# Patient Record
Sex: Female | Born: 1935 | Race: White | Hispanic: No | Smoking: Former smoker
Health system: Southern US, Community
[De-identification: ages and names within clinical notes are randomized; demographics above are authoritative.]

## PROBLEM LIST (undated history)

## (undated) DIAGNOSIS — I10 Essential (primary) hypertension: Secondary | ICD-10-CM

## (undated) DIAGNOSIS — M109 Gout, unspecified: Secondary | ICD-10-CM

## (undated) DIAGNOSIS — E785 Hyperlipidemia, unspecified: Secondary | ICD-10-CM

## (undated) DIAGNOSIS — R413 Other amnesia: Secondary | ICD-10-CM

## (undated) DIAGNOSIS — H269 Unspecified cataract: Secondary | ICD-10-CM

## (undated) HISTORY — DX: Gout, unspecified: M10.9

## (undated) HISTORY — PX: TUBAL LIGATION: SHX77

## (undated) HISTORY — DX: Other amnesia: R41.3

## (undated) HISTORY — DX: Hyperlipidemia, unspecified: E78.5

## (undated) HISTORY — DX: Essential (primary) hypertension: I10

## (undated) HISTORY — DX: Unspecified cataract: H26.9

---

## 1970-10-28 HISTORY — PX: MELANOMA EXCISION: SHX5266

## 1982-10-28 HISTORY — PX: SUPERFICIAL LYMPH NODE BIOPSY / EXCISION: SUR127

## 1998-02-10 ENCOUNTER — Ambulatory Visit (HOSPITAL_COMMUNITY): Admission: RE | Admit: 1998-02-10 | Discharge: 1998-02-10 | Payer: Self-pay | Admitting: Internal Medicine

## 1998-02-22 ENCOUNTER — Ambulatory Visit (HOSPITAL_COMMUNITY): Admission: RE | Admit: 1998-02-22 | Discharge: 1998-02-22 | Payer: Self-pay | Admitting: Cardiology

## 1998-03-23 ENCOUNTER — Encounter: Admission: RE | Admit: 1998-03-23 | Discharge: 1998-06-21 | Payer: Self-pay | Admitting: Internal Medicine

## 1999-08-23 ENCOUNTER — Ambulatory Visit (HOSPITAL_COMMUNITY): Admission: RE | Admit: 1999-08-23 | Discharge: 1999-08-23 | Payer: Self-pay | Admitting: Internal Medicine

## 1999-10-11 ENCOUNTER — Encounter: Admission: RE | Admit: 1999-10-11 | Discharge: 1999-10-11 | Payer: Self-pay | Admitting: Internal Medicine

## 1999-10-11 ENCOUNTER — Encounter: Payer: Self-pay | Admitting: Internal Medicine

## 1999-10-16 ENCOUNTER — Encounter: Payer: Self-pay | Admitting: Internal Medicine

## 1999-10-16 ENCOUNTER — Encounter: Admission: RE | Admit: 1999-10-16 | Discharge: 1999-10-16 | Payer: Self-pay | Admitting: Internal Medicine

## 2000-06-17 ENCOUNTER — Encounter: Payer: Self-pay | Admitting: Surgery

## 2000-06-17 ENCOUNTER — Encounter: Admission: RE | Admit: 2000-06-17 | Discharge: 2000-06-17 | Payer: Self-pay | Admitting: Surgery

## 2000-06-24 ENCOUNTER — Encounter: Admission: RE | Admit: 2000-06-24 | Discharge: 2000-06-24 | Payer: Self-pay | Admitting: Surgery

## 2000-06-24 ENCOUNTER — Encounter: Payer: Self-pay | Admitting: Surgery

## 2000-07-30 ENCOUNTER — Other Ambulatory Visit: Admission: RE | Admit: 2000-07-30 | Discharge: 2000-07-30 | Payer: Self-pay | Admitting: Internal Medicine

## 2000-08-06 ENCOUNTER — Ambulatory Visit (HOSPITAL_COMMUNITY): Admission: RE | Admit: 2000-08-06 | Discharge: 2000-08-06 | Payer: Self-pay | Admitting: Internal Medicine

## 2000-09-09 ENCOUNTER — Encounter: Admission: RE | Admit: 2000-09-09 | Discharge: 2000-09-22 | Payer: Self-pay | Admitting: Neurosurgery

## 2000-10-23 ENCOUNTER — Encounter: Admission: RE | Admit: 2000-10-23 | Discharge: 2000-10-23 | Payer: Self-pay | Admitting: Internal Medicine

## 2000-10-23 ENCOUNTER — Encounter: Payer: Self-pay | Admitting: Internal Medicine

## 2001-10-27 ENCOUNTER — Encounter: Admission: RE | Admit: 2001-10-27 | Discharge: 2001-10-27 | Payer: Self-pay | Admitting: Internal Medicine

## 2001-10-27 ENCOUNTER — Encounter: Payer: Self-pay | Admitting: Internal Medicine

## 2002-09-16 ENCOUNTER — Ambulatory Visit (HOSPITAL_COMMUNITY): Admission: RE | Admit: 2002-09-16 | Discharge: 2002-09-16 | Payer: Self-pay | Admitting: Internal Medicine

## 2002-11-12 ENCOUNTER — Encounter: Payer: Self-pay | Admitting: Internal Medicine

## 2002-11-12 ENCOUNTER — Encounter: Admission: RE | Admit: 2002-11-12 | Discharge: 2002-11-12 | Payer: Self-pay | Admitting: Internal Medicine

## 2003-11-16 ENCOUNTER — Encounter: Admission: RE | Admit: 2003-11-16 | Discharge: 2003-11-16 | Payer: Self-pay | Admitting: Internal Medicine

## 2004-09-07 ENCOUNTER — Ambulatory Visit: Payer: Self-pay | Admitting: Internal Medicine

## 2004-09-14 ENCOUNTER — Ambulatory Visit: Payer: Self-pay | Admitting: Internal Medicine

## 2004-09-14 ENCOUNTER — Ambulatory Visit (HOSPITAL_COMMUNITY): Admission: RE | Admit: 2004-09-14 | Discharge: 2004-09-14 | Payer: Self-pay | Admitting: Internal Medicine

## 2004-09-18 ENCOUNTER — Other Ambulatory Visit: Admission: RE | Admit: 2004-09-18 | Discharge: 2004-09-18 | Payer: Self-pay | Admitting: Family Medicine

## 2004-11-19 ENCOUNTER — Ambulatory Visit (HOSPITAL_COMMUNITY): Admission: RE | Admit: 2004-11-19 | Discharge: 2004-11-19 | Payer: Self-pay | Admitting: Internal Medicine

## 2005-04-11 ENCOUNTER — Ambulatory Visit (HOSPITAL_COMMUNITY): Admission: RE | Admit: 2005-04-11 | Discharge: 2005-04-11 | Payer: Self-pay | Admitting: Internal Medicine

## 2005-11-21 ENCOUNTER — Ambulatory Visit (HOSPITAL_COMMUNITY): Admission: RE | Admit: 2005-11-21 | Discharge: 2005-11-21 | Payer: Self-pay | Admitting: Internal Medicine

## 2006-03-25 ENCOUNTER — Encounter: Admission: RE | Admit: 2006-03-25 | Discharge: 2006-03-25 | Payer: Self-pay | Admitting: Internal Medicine

## 2006-11-28 ENCOUNTER — Encounter: Admission: RE | Admit: 2006-11-28 | Discharge: 2006-11-28 | Payer: Self-pay | Admitting: Internal Medicine

## 2007-12-02 ENCOUNTER — Ambulatory Visit (HOSPITAL_COMMUNITY): Admission: RE | Admit: 2007-12-02 | Discharge: 2007-12-02 | Payer: Self-pay | Admitting: Internal Medicine

## 2007-12-02 ENCOUNTER — Encounter (INDEPENDENT_AMBULATORY_CARE_PROVIDER_SITE_OTHER): Payer: Self-pay | Admitting: Internal Medicine

## 2007-12-08 ENCOUNTER — Encounter: Admission: RE | Admit: 2007-12-08 | Discharge: 2007-12-08 | Payer: Self-pay | Admitting: Internal Medicine

## 2007-12-19 ENCOUNTER — Emergency Department (HOSPITAL_COMMUNITY): Admission: EM | Admit: 2007-12-19 | Discharge: 2007-12-19 | Payer: Self-pay | Admitting: Family Medicine

## 2008-11-15 ENCOUNTER — Telehealth: Payer: Self-pay | Admitting: Internal Medicine

## 2008-12-23 ENCOUNTER — Encounter: Admission: RE | Admit: 2008-12-23 | Discharge: 2008-12-23 | Payer: Self-pay | Admitting: Internal Medicine

## 2009-08-08 ENCOUNTER — Encounter (INDEPENDENT_AMBULATORY_CARE_PROVIDER_SITE_OTHER): Payer: Self-pay | Admitting: *Deleted

## 2009-08-31 ENCOUNTER — Ambulatory Visit: Payer: Self-pay | Admitting: Internal Medicine

## 2009-09-13 ENCOUNTER — Telehealth (INDEPENDENT_AMBULATORY_CARE_PROVIDER_SITE_OTHER): Payer: Self-pay | Admitting: *Deleted

## 2009-09-14 ENCOUNTER — Ambulatory Visit: Payer: Self-pay | Admitting: Internal Medicine

## 2009-12-28 ENCOUNTER — Encounter: Admission: RE | Admit: 2009-12-28 | Discharge: 2009-12-28 | Payer: Self-pay | Admitting: Internal Medicine

## 2010-11-21 ENCOUNTER — Other Ambulatory Visit: Payer: Self-pay | Admitting: Internal Medicine

## 2010-11-21 DIAGNOSIS — Z1239 Encounter for other screening for malignant neoplasm of breast: Secondary | ICD-10-CM

## 2010-12-07 ENCOUNTER — Ambulatory Visit (HOSPITAL_COMMUNITY)
Admission: AD | Admit: 2010-12-07 | Discharge: 2010-12-08 | Disposition: A | Discharge status: Home / Self Care | Payer: BC Managed Care – PPO | Source: Ambulatory Visit | Attending: Internal Medicine | Admitting: Internal Medicine

## 2010-12-07 DIAGNOSIS — Z79899 Other long term (current) drug therapy: Secondary | ICD-10-CM | POA: Insufficient documentation

## 2010-12-07 DIAGNOSIS — Z87891 Personal history of nicotine dependence: Secondary | ICD-10-CM | POA: Insufficient documentation

## 2010-12-07 DIAGNOSIS — I6529 Occlusion and stenosis of unspecified carotid artery: Secondary | ICD-10-CM | POA: Insufficient documentation

## 2010-12-07 DIAGNOSIS — E785 Hyperlipidemia, unspecified: Secondary | ICD-10-CM | POA: Insufficient documentation

## 2010-12-07 DIAGNOSIS — Z85820 Personal history of malignant melanoma of skin: Secondary | ICD-10-CM | POA: Insufficient documentation

## 2010-12-07 DIAGNOSIS — I10 Essential (primary) hypertension: Secondary | ICD-10-CM | POA: Insufficient documentation

## 2010-12-07 DIAGNOSIS — Z8249 Family history of ischemic heart disease and other diseases of the circulatory system: Secondary | ICD-10-CM | POA: Insufficient documentation

## 2010-12-07 DIAGNOSIS — R0789 Other chest pain: Secondary | ICD-10-CM | POA: Insufficient documentation

## 2010-12-07 DIAGNOSIS — I059 Rheumatic mitral valve disease, unspecified: Secondary | ICD-10-CM | POA: Insufficient documentation

## 2010-12-07 LAB — CBC
MCV: 88.8 fL (ref 78.0–100.0)
Platelets: 191 10*3/uL (ref 150–400)
RBC: 4.18 MIL/uL (ref 3.87–5.11)
WBC: 9.8 10*3/uL (ref 4.0–10.5)

## 2010-12-07 LAB — COMPREHENSIVE METABOLIC PANEL
ALT: 19 U/L (ref 0–35)
AST: 19 U/L (ref 0–37)
Albumin: 3.9 g/dL (ref 3.5–5.2)
Alkaline Phosphatase: 96 U/L (ref 39–117)
BUN: 20 mg/dL (ref 6–23)
Chloride: 105 mEq/L (ref 96–112)
Potassium: 4 mEq/L (ref 3.5–5.1)
Sodium: 140 mEq/L (ref 135–145)
Total Bilirubin: 1 mg/dL (ref 0.3–1.2)
Total Protein: 6.8 g/dL (ref 6.0–8.3)

## 2010-12-07 LAB — CARDIAC PANEL(CRET KIN+CKTOT+MB+TROPI)
CK, MB: 1.9 ng/mL (ref 0.3–4.0)
Relative Index: INVALID (ref 0.0–2.5)
Relative Index: INVALID (ref 0.0–2.5)
Total CK: 81 U/L (ref 7–177)
Troponin I: <0.01 ng/mL (ref 0.00–0.06)

## 2010-12-16 NOTE — Discharge Summary (Signed)
  NAMEJAHNE, Katelyn Powers                   ACCOUNT NO.:  [id number]  MEDICAL RECORD NO.:  [id number]           PATIENT TYPE:  I  LOCATION:  1423                         FACILITY:  [facility]  PHYSICIAN:  Georgann Housekeeper, MD      DATE OF BIRTH:  Apr 26, 1932  DATE OF ADMISSION:  12/07/2010 DATE OF DISCHARGE:  12/08/2010                              DISCHARGE SUMMARY   DISCHARGE DIAGNOSES: 1. Chest pain, rule out myocardial infarction, atypical. 2. Hypertension.  MEDICATIONS ON DISCHARGE: 1. Losartan 100 mg daily. 2. Simvastatin 40 mg daily. 3. Vitamin D3 1000 units daily. 4. Ibuprofen 200 mg p.r.n. every 8 hours. 5. Aspirin 81 mg daily. 6. Allopurinol 300 mg daily. 7. Amlodipine 10 mg daily.  LABORATORY DATA AND DIAGNOSTIC STUDIES:  CK-MB, troponin x2 negative. Blood chemistries; sodium 140, potassium 4.0, creatinine of 0.8.  CBC; hemoglobin of 12.9, white count 9.8.  Chest x-ray at the office was negative.  EKG, repeat, normal sinus rhythm.  Telemetry normal without any arrhythmias.  HOSPITAL COURSE:  [age]-year-old female with history hypertension, dyslipidemia with left shoulder and upper back discomfort, presented to the office, admitted for rule out MI.  The patient was admitted to telemetry.  She had no chest pain, difficulty breathing.  Her blood work and EKG all remained negative.  Blood pressure remained stable.  The patient will be discharged.  Pain was likely thought to be more musculoskeletal.  She will continue ibuprofen.  If the symptoms recur, she will call the PCP, otherwise follow up as scheduled next month. Discharged stable.     Georgann Housekeeper, MD     [name]  D:  12/08/2010  T:  12/08/2010  Job:  [id number]  Electronically Signed by Georgann Housekeeper MD on 12/16/2010 10:14:24 AM

## 2010-12-19 NOTE — H&P (Addendum)
Katelyn Powers, TRUMBO NO.:  [id number]  MEDICAL RECORD NO.:  [id number]           PATIENT TYPE:  I  LOCATION:  1423                         FACILITY:  [facility]  PHYSICIAN:  Elby Showers, MD    DATE OF BIRTH:  1932-08-22  DATE OF ADMISSION:  12/07/2010 DATE OF DISCHARGE:                             HISTORY & PHYSICAL   PRIMARY CARE PROVIDER:  Thora Lance, MD  CHIEF COMPLAINT:  Chest pain.  HISTORY OF PRESENT ILLNESS:  This is a 77 year old female with past medical history of hypertension, hyperlipidemia, right internal carotid artery stenosis, and a 2-D echo in October 2011 showing mildly thickened mitral valve with mild regurgitation, mild calcification of the aortic valve with very mild aortic stenosis, and trace aortic regurgitation and grade 1 diastolic dysfunction with a preserved left ventricular function and ejection fraction of 68%, presents today with complaint of left- sided chest pain.  She describes the pain originating beneath the left scapula radiating to the front of the left chest starting at approximately 6 p.m. last night.  At worst, pain is 7/10, last for 30 minutes to an hour and then subsides and feels like a fullness in the left chest.  The sensation has been persistent over night.  She woke up at midnight and then again at 4 a.m. wondering if she should call the emergency department.  She has had no diaphoresis, no nausea or vomiting, no lightheadedness, no peripheral edema, no shortness of breath.  At the moment, she has only mild sensation of pressure, but reports that on the drive to the clinic she was having left-sided chest pain.  CURRENT MEDICATIONS: 1. Vitamin D 400 units 1 tablet twice a day. 2. Aspirin 81 mg 1 tablet daily. 3. Amlodipine 10 mg 1 tablet daily. 4. Losartan 100 mg 1 tablet daily. 5. Allopurinol 300 mg 1 tablet daily. 6. Simvastatin 40 mg one-half tablet every evening.  MEDICAL HISTORY: 1.  Hypertension. 2. Tophaceous gout. 3. Right internal carotid artery stenosis 60% to 80%. 4. Mild aortic stenosis by 2-D echo in October 2011. 5. Hyperlipidemia. 6. Malignant melanoma of the right arm. 7. Impaired fasting glucose.  ALLERGIES:  LISINOPRIL causes cough.  PAST SURGERIES: 1. Malignant melanoma excision right arm in 1971 with recurrence and     repeated surgery in 1983. 2. Left axillary lymph node dissection in 1983.  FAMILY MEDICAL HISTORY:  Significant for multiple family members with hypertension, coronary artery disease, colon cancer.  One brother with early coronary artery disease starting in his 54s.  SOCIAL HISTORY:  She is a former smoker, quit in 1982/03/21 with a one to two pack-year history.  She does not consume alcohol.  She does not exercise.  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  She is widowed.  Her husband died of lung cancer in 03/22/2003. She has 2 children.  REVIEW OF SYSTEMS:  Per history of present illness.  She denies association of pain with movement of the left arm, shoulder, or neck. Denies association with exertion, but has not exerted herself.  Denies cough, fever, chills, trauma to  the left side, vision change, change in exercise tolerance over the past few weeks.  PHYSICAL EXAMINATION:  VITAL SIGNS:  Temperature 98.1, pulse 72, blood pressure 128/68. GENERAL APPEARANCE:  Alert, oriented, no apparent distress.  Nervous. HEENT:  Pupils are equal, round, and reactive.  Extraocular motions are intact.  Conjunctivae are clear.  There is significant scarring in the left tympanic membrane, but no erythema.  Right tympanic membrane is clear.  Mucous membranes are moist.  Good dentition.  Oropharynx is clear. NECK:  Supple.  No lymphadenopathy, no thyromegaly.  No thyroid tenderness.  No JVD, no bruit.  Note, there is a midline scar, which is from a childhood accident.  This is not a surgical scar. LUNGS:  Clear to auscultation  bilaterally with good air movement.  No pain with deep inspiration. HEART:  Regular rate and rhythm.  There is a 3/6 systolic ejection murmur. ABDOMEN:  Bowel sounds are present.  Soft, nontender, nondistended.  No guarding. EXTREMITIES:  Pulses were 2+.  There is trace edema bilaterally. NEUROLOGIC:  Nonfocal.  ASSESSMENT AND PLAN:  Chest pain.  Chest pain is atypical and it is not associated with exertion, shortness of breath, or diaphoresis.  She does have risk factors of advanced age, hypertension, and hyperlipidemia. She has been experiencing persistent chest pressure or pain for about 12 hours.  EKG in clinic this morning is unremarkable.  Chest x-ray is unremarkable.  I will admit to telemetry for cardiac rule out.  We will obtain three sets of cardiac enzymes and repeat EKG.  If all test negative, she will likely be discharged in the morning and scheduled for an outpatient cardiac evaluation.  There are no labs obtained in the clinic today.  These will be done as an inpatient.     Elby Showers, MD     CW/MEDQ  D:  12/07/2010  T:  12/07/2010  Job:  [id number]  Electronically Signed by Elby Showers MD on 12/19/2010 09:59:24 AM

## 2011-01-01 ENCOUNTER — Ambulatory Visit
Admission: RE | Admit: 2011-01-01 | Discharge: 2011-01-01 | Disposition: A | Discharge status: Home / Self Care | Payer: BC Managed Care – PPO | Source: Ambulatory Visit | Attending: Internal Medicine | Admitting: Internal Medicine

## 2011-01-01 DIAGNOSIS — Z1239 Encounter for other screening for malignant neoplasm of breast: Secondary | ICD-10-CM

## 2011-12-06 ENCOUNTER — Other Ambulatory Visit: Payer: Self-pay | Admitting: Internal Medicine

## 2011-12-06 DIAGNOSIS — Z1231 Encounter for screening mammogram for malignant neoplasm of breast: Secondary | ICD-10-CM

## 2012-01-02 ENCOUNTER — Ambulatory Visit
Admission: RE | Admit: 2012-01-02 | Discharge: 2012-01-02 | Disposition: A | Discharge status: Home / Self Care | Payer: Medicare Other | Source: Ambulatory Visit | Attending: Internal Medicine | Admitting: Internal Medicine

## 2012-01-02 DIAGNOSIS — Z1231 Encounter for screening mammogram for malignant neoplasm of breast: Secondary | ICD-10-CM

## 2013-01-27 ENCOUNTER — Other Ambulatory Visit: Payer: Self-pay

## 2013-01-27 DIAGNOSIS — Z1231 Encounter for screening mammogram for malignant neoplasm of breast: Secondary | ICD-10-CM

## 2013-01-28 ENCOUNTER — Ambulatory Visit
Admission: RE | Admit: 2013-01-28 | Discharge: 2013-01-28 | Disposition: A | Discharge status: Home / Self Care | Payer: Medicare Other | Source: Ambulatory Visit

## 2013-01-28 DIAGNOSIS — Z1231 Encounter for screening mammogram for malignant neoplasm of breast: Secondary | ICD-10-CM

## 2014-02-08 ENCOUNTER — Other Ambulatory Visit: Payer: Self-pay | Admitting: Internal Medicine

## 2014-02-08 DIAGNOSIS — I6529 Occlusion and stenosis of unspecified carotid artery: Secondary | ICD-10-CM

## 2014-02-09 ENCOUNTER — Other Ambulatory Visit: Payer: Self-pay

## 2014-02-09 DIAGNOSIS — Z1231 Encounter for screening mammogram for malignant neoplasm of breast: Secondary | ICD-10-CM

## 2014-02-15 ENCOUNTER — Ambulatory Visit
Admission: RE | Admit: 2014-02-15 | Discharge: 2014-02-15 | Disposition: A | Discharge status: Home / Self Care | Payer: Commercial Managed Care - HMO | Source: Ambulatory Visit | Attending: Internal Medicine | Admitting: Internal Medicine

## 2014-02-15 ENCOUNTER — Ambulatory Visit
Admission: RE | Admit: 2014-02-15 | Discharge: 2014-02-15 | Disposition: A | Discharge status: Home / Self Care | Payer: Self-pay | Source: Ambulatory Visit

## 2014-02-15 ENCOUNTER — Other Ambulatory Visit: Payer: Medicare Other

## 2014-02-15 ENCOUNTER — Encounter (INDEPENDENT_AMBULATORY_CARE_PROVIDER_SITE_OTHER): Payer: Self-pay

## 2014-02-15 DIAGNOSIS — I6529 Occlusion and stenosis of unspecified carotid artery: Secondary | ICD-10-CM

## 2014-02-15 DIAGNOSIS — Z1231 Encounter for screening mammogram for malignant neoplasm of breast: Secondary | ICD-10-CM

## 2014-06-03 ENCOUNTER — Encounter: Payer: Self-pay | Admitting: Internal Medicine

## 2014-06-29 ENCOUNTER — Encounter: Payer: Self-pay | Admitting: Internal Medicine

## 2014-07-06 ENCOUNTER — Encounter: Payer: Self-pay | Admitting: Internal Medicine

## 2014-08-10 ENCOUNTER — Telehealth: Payer: Self-pay | Admitting: *Deleted

## 2014-08-10 ENCOUNTER — Ambulatory Visit (AMBULATORY_SURGERY_CENTER): Payer: Self-pay | Admitting: *Deleted

## 2014-08-10 VITALS — Ht 63.0 in | Wt 176.0 lb

## 2014-08-10 DIAGNOSIS — Z800 Family history of malignant neoplasm of digestive organs: Secondary | ICD-10-CM

## 2014-08-10 MED ORDER — MOVIPREP 100 G PO SOLR: ORAL | Status: DC

## 2014-08-10 NOTE — Telephone Encounter (Signed)
We recommend colonoscopy till age 80, especially  In high risk patients

## 2014-08-10 NOTE — Progress Notes (Signed)
Patient denies any allergies to eggs or soy. Patient states she was "hard to wake up from last colonoscopy". Patient denies any oxygen use at home and does not take any diet/weight loss medications. EMMI education assisgned to patient on colonoscopy, this was explained and instructions given to patient.

## 2014-08-10 NOTE — Telephone Encounter (Signed)
Patient is here for pre-visit today, her colonoscopy is 09-15-14. She does have family history colon cancer (sister at age 78), also had brother with cancerous polyps removed. Patient denies any colon symptoms or concerns at this time. Thinks she has a "hemorrhoid". Patient wants to know if she should have this colonoscopy at her age of 32. Her last colonoscopy was in 2010, no polyps. Report states patient has had hyperplastic polyps in the past. Please advise, thank you Robbin.

## 2014-08-11 NOTE — Telephone Encounter (Signed)
Spoke with pt, advised Dr Olevia Perches still recommends another colonoscopy at this time bc of family hx, pt states understanding and appreciated that we asked-adm

## 2014-08-25 ENCOUNTER — Encounter: Payer: Self-pay | Admitting: Internal Medicine

## 2014-09-15 ENCOUNTER — Ambulatory Visit (AMBULATORY_SURGERY_CENTER): Payer: Commercial Managed Care - HMO | Admitting: Internal Medicine

## 2014-09-15 ENCOUNTER — Encounter: Payer: Self-pay | Admitting: Internal Medicine

## 2014-09-15 VITALS — BP 127/72 | HR 64 | Temp 97.0°F | Resp 23 | Ht 63.0 in | Wt 176.0 lb

## 2014-09-15 DIAGNOSIS — Z800 Family history of malignant neoplasm of digestive organs: Secondary | ICD-10-CM

## 2014-09-15 DIAGNOSIS — Z1211 Encounter for screening for malignant neoplasm of colon: Secondary | ICD-10-CM

## 2014-09-15 MED ORDER — SODIUM CHLORIDE 0.9 % IV SOLN: 500.0000 mL | INTRAVENOUS | Status: DC

## 2014-09-15 NOTE — Patient Instructions (Signed)

## 2014-09-15 NOTE — Op Note (Signed)
[facility]  [address] [place], [address]   COLONOSCOPY PROCEDURE REPORT  PATIENT: Katelyn Powers, Katelyn Powers  MR#: [id number] BIRTHDATE: 11-12-1931 , 81  yrs. old GENDER: female ENDOSCOPIST: Lafayette Dragon, MD REFERRED EH:[facility] Laurann Montana, M.D. PROCEDURE DATE:  09/15/2014 PROCEDURE:   Colonoscopy, screening First Screening Colonoscopy - Avg.  risk and is 50 yrs.  old or older - No.  Prior Negative Screening - Now for repeat screening. Less than 10 yrs Prior Negative Screening - Now for repeat screening.  Above average risk  History of Adenoma - Now for follow-up colonoscopy & has been > or = to 3 yrs.  N/A  Polyps Removed Today? No. ASA CLASS:   Class II INDICATIONS:sister with colon cancer at age 81, brother with premalignant polyps, last colonoscopy in 2010. MEDICATIONS: Monitored anesthesia care and Propofol 200 mg IV  DESCRIPTION OF PROCEDURE:   After the risks benefits and alternatives of the procedure were thoroughly explained, informed consent was obtained.  The digital rectal exam revealed no abnormalities of the rectum.   The LB [id number]  endoscope was introduced through the anus and advanced to the cecum, which was identified by both the appendix and ileocecal valve. No adverse events experienced.   The quality of the prep was good, using MoviPrep  The instrument was then slowly withdrawn as the colon was fully examined.      COLON FINDINGS: There was moderate diverticulosis noted throughout the entire examined colon with associated muscular hypertrophy, tortuosity and angulation.  Retroflexed views revealed no abnormalities. The time to cecum=6 minutes 13 seconds.  Withdrawal time=6 minutes 19 seconds.  The scope was withdrawn and the procedure completed. COMPLICATIONS: There were no immediate complications.  ENDOSCOPIC IMPRESSION: There was moderate diverticulosis noted throughout the entire examined colon  RECOMMENDATIONS: High fiber diet no  recall colonoscopy due to age  eSigned:  Lafayette Dragon, MD 09/15/2014 11:21 AM   cc:

## 2014-09-15 NOTE — Progress Notes (Signed)
Report to PACU, RN, vss, BBS= Clear.  

## 2015-02-08 ENCOUNTER — Other Ambulatory Visit: Payer: Self-pay

## 2015-02-08 DIAGNOSIS — Z1231 Encounter for screening mammogram for malignant neoplasm of breast: Secondary | ICD-10-CM

## 2015-02-14 DIAGNOSIS — Z1389 Encounter for screening for other disorder: Secondary | ICD-10-CM | POA: Diagnosis not present

## 2015-02-14 DIAGNOSIS — I10 Essential (primary) hypertension: Secondary | ICD-10-CM | POA: Diagnosis not present

## 2015-02-14 DIAGNOSIS — R202 Paresthesia of skin: Secondary | ICD-10-CM | POA: Diagnosis not present

## 2015-02-14 DIAGNOSIS — R7301 Impaired fasting glucose: Secondary | ICD-10-CM | POA: Diagnosis not present

## 2015-02-14 DIAGNOSIS — E782 Mixed hyperlipidemia: Secondary | ICD-10-CM | POA: Diagnosis not present

## 2015-02-14 DIAGNOSIS — M1A9XX1 Chronic gout, unspecified, with tophus (tophi): Secondary | ICD-10-CM | POA: Diagnosis not present

## 2015-02-14 DIAGNOSIS — G629 Polyneuropathy, unspecified: Secondary | ICD-10-CM | POA: Diagnosis not present

## 2015-02-14 DIAGNOSIS — Z0000 Encounter for general adult medical examination without abnormal findings: Secondary | ICD-10-CM | POA: Diagnosis not present

## 2015-03-02 ENCOUNTER — Ambulatory Visit: Payer: Commercial Managed Care - HMO

## 2015-04-06 DIAGNOSIS — D225 Melanocytic nevi of trunk: Secondary | ICD-10-CM | POA: Diagnosis not present

## 2015-04-06 DIAGNOSIS — L814 Other melanin hyperpigmentation: Secondary | ICD-10-CM | POA: Diagnosis not present

## 2015-04-06 DIAGNOSIS — R208 Other disturbances of skin sensation: Secondary | ICD-10-CM | POA: Diagnosis not present

## 2015-04-06 DIAGNOSIS — Z85820 Personal history of malignant melanoma of skin: Secondary | ICD-10-CM | POA: Diagnosis not present

## 2015-04-06 DIAGNOSIS — L821 Other seborrheic keratosis: Secondary | ICD-10-CM | POA: Diagnosis not present

## 2015-05-23 ENCOUNTER — Ambulatory Visit
Admission: RE | Admit: 2015-05-23 | Discharge: 2015-05-23 | Disposition: A | Discharge status: Home / Self Care | Payer: Commercial Managed Care - HMO | Source: Ambulatory Visit

## 2015-05-23 DIAGNOSIS — Z1231 Encounter for screening mammogram for malignant neoplasm of breast: Secondary | ICD-10-CM

## 2015-05-23 DIAGNOSIS — G629 Polyneuropathy, unspecified: Secondary | ICD-10-CM | POA: Diagnosis not present

## 2015-05-23 DIAGNOSIS — I10 Essential (primary) hypertension: Secondary | ICD-10-CM | POA: Diagnosis not present

## 2015-07-04 DIAGNOSIS — Z23 Encounter for immunization: Secondary | ICD-10-CM | POA: Diagnosis not present

## 2015-07-04 DIAGNOSIS — R202 Paresthesia of skin: Secondary | ICD-10-CM | POA: Diagnosis not present

## 2015-08-10 DIAGNOSIS — R208 Other disturbances of skin sensation: Secondary | ICD-10-CM | POA: Diagnosis not present

## 2015-08-10 DIAGNOSIS — Z85820 Personal history of malignant melanoma of skin: Secondary | ICD-10-CM | POA: Diagnosis not present

## 2015-08-10 DIAGNOSIS — D2262 Melanocytic nevi of left upper limb, including shoulder: Secondary | ICD-10-CM | POA: Diagnosis not present

## 2015-08-10 DIAGNOSIS — L814 Other melanin hyperpigmentation: Secondary | ICD-10-CM | POA: Diagnosis not present

## 2015-08-10 DIAGNOSIS — L821 Other seborrheic keratosis: Secondary | ICD-10-CM | POA: Diagnosis not present

## 2015-08-15 DIAGNOSIS — I10 Essential (primary) hypertension: Secondary | ICD-10-CM | POA: Diagnosis not present

## 2015-08-15 DIAGNOSIS — M109 Gout, unspecified: Secondary | ICD-10-CM | POA: Diagnosis not present

## 2015-08-15 DIAGNOSIS — H6012 Cellulitis of left external ear: Secondary | ICD-10-CM | POA: Diagnosis not present

## 2015-08-15 DIAGNOSIS — R7303 Prediabetes: Secondary | ICD-10-CM | POA: Diagnosis not present

## 2015-08-15 DIAGNOSIS — E781 Pure hyperglyceridemia: Secondary | ICD-10-CM | POA: Diagnosis not present

## 2015-08-15 DIAGNOSIS — Z6828 Body mass index (BMI) 28.0-28.9, adult: Secondary | ICD-10-CM | POA: Diagnosis not present

## 2016-02-13 DIAGNOSIS — R202 Paresthesia of skin: Secondary | ICD-10-CM | POA: Diagnosis not present

## 2016-02-13 DIAGNOSIS — E782 Mixed hyperlipidemia: Secondary | ICD-10-CM | POA: Diagnosis not present

## 2016-02-13 DIAGNOSIS — M1A9XX1 Chronic gout, unspecified, with tophus (tophi): Secondary | ICD-10-CM | POA: Diagnosis not present

## 2016-02-13 DIAGNOSIS — I10 Essential (primary) hypertension: Secondary | ICD-10-CM | POA: Diagnosis not present

## 2016-02-13 DIAGNOSIS — R7301 Impaired fasting glucose: Secondary | ICD-10-CM | POA: Diagnosis not present

## 2016-02-13 DIAGNOSIS — Z1389 Encounter for screening for other disorder: Secondary | ICD-10-CM | POA: Diagnosis not present

## 2016-02-13 DIAGNOSIS — Z0000 Encounter for general adult medical examination without abnormal findings: Secondary | ICD-10-CM | POA: Diagnosis not present

## 2016-05-24 ENCOUNTER — Other Ambulatory Visit: Payer: Self-pay | Admitting: Internal Medicine

## 2016-05-24 DIAGNOSIS — Z1231 Encounter for screening mammogram for malignant neoplasm of breast: Secondary | ICD-10-CM

## 2016-06-13 ENCOUNTER — Ambulatory Visit
Admission: RE | Admit: 2016-06-13 | Discharge: 2016-06-13 | Disposition: A | Discharge status: Home / Self Care | Payer: Commercial Managed Care - HMO | Source: Ambulatory Visit | Attending: Internal Medicine | Admitting: Internal Medicine

## 2016-06-13 DIAGNOSIS — H5213 Myopia, bilateral: Secondary | ICD-10-CM | POA: Diagnosis not present

## 2016-06-13 DIAGNOSIS — Z1231 Encounter for screening mammogram for malignant neoplasm of breast: Secondary | ICD-10-CM

## 2016-06-13 DIAGNOSIS — H5210 Myopia, unspecified eye: Secondary | ICD-10-CM | POA: Diagnosis not present

## 2016-08-14 DIAGNOSIS — I10 Essential (primary) hypertension: Secondary | ICD-10-CM | POA: Diagnosis not present

## 2016-08-14 DIAGNOSIS — G4700 Insomnia, unspecified: Secondary | ICD-10-CM | POA: Diagnosis not present

## 2016-08-14 DIAGNOSIS — Z23 Encounter for immunization: Secondary | ICD-10-CM | POA: Diagnosis not present

## 2016-12-18 DIAGNOSIS — Z20828 Contact with and (suspected) exposure to other viral communicable diseases: Secondary | ICD-10-CM | POA: Diagnosis not present

## 2016-12-18 DIAGNOSIS — J101 Influenza due to other identified influenza virus with other respiratory manifestations: Secondary | ICD-10-CM | POA: Diagnosis not present

## 2017-01-23 DIAGNOSIS — B351 Tinea unguium: Secondary | ICD-10-CM | POA: Diagnosis not present

## 2017-01-23 DIAGNOSIS — L03032 Cellulitis of left toe: Secondary | ICD-10-CM | POA: Diagnosis not present

## 2017-02-13 ENCOUNTER — Other Ambulatory Visit: Payer: Self-pay | Admitting: Internal Medicine

## 2017-02-13 DIAGNOSIS — M1A9XX1 Chronic gout, unspecified, with tophus (tophi): Secondary | ICD-10-CM | POA: Diagnosis not present

## 2017-02-13 DIAGNOSIS — R413 Other amnesia: Secondary | ICD-10-CM

## 2017-02-13 DIAGNOSIS — R7301 Impaired fasting glucose: Secondary | ICD-10-CM | POA: Diagnosis not present

## 2017-02-13 DIAGNOSIS — R8290 Unspecified abnormal findings in urine: Secondary | ICD-10-CM | POA: Diagnosis not present

## 2017-02-13 DIAGNOSIS — E782 Mixed hyperlipidemia: Secondary | ICD-10-CM | POA: Diagnosis not present

## 2017-02-13 DIAGNOSIS — I10 Essential (primary) hypertension: Secondary | ICD-10-CM | POA: Diagnosis not present

## 2017-02-19 ENCOUNTER — Ambulatory Visit
Admission: RE | Admit: 2017-02-19 | Discharge: 2017-02-19 | Disposition: A | Discharge status: Home / Self Care | Payer: Medicare HMO | Source: Ambulatory Visit | Attending: Internal Medicine | Admitting: Internal Medicine

## 2017-02-19 DIAGNOSIS — L608 Other nail disorders: Secondary | ICD-10-CM | POA: Diagnosis not present

## 2017-02-19 DIAGNOSIS — L720 Epidermal cyst: Secondary | ICD-10-CM | POA: Diagnosis not present

## 2017-02-19 DIAGNOSIS — D2262 Melanocytic nevi of left upper limb, including shoulder: Secondary | ICD-10-CM | POA: Diagnosis not present

## 2017-02-19 DIAGNOSIS — L814 Other melanin hyperpigmentation: Secondary | ICD-10-CM | POA: Diagnosis not present

## 2017-02-19 DIAGNOSIS — D1801 Hemangioma of skin and subcutaneous tissue: Secondary | ICD-10-CM | POA: Diagnosis not present

## 2017-02-19 DIAGNOSIS — L03011 Cellulitis of right finger: Secondary | ICD-10-CM | POA: Diagnosis not present

## 2017-02-19 DIAGNOSIS — B351 Tinea unguium: Secondary | ICD-10-CM | POA: Diagnosis not present

## 2017-02-19 DIAGNOSIS — L03032 Cellulitis of left toe: Secondary | ICD-10-CM | POA: Diagnosis not present

## 2017-02-19 DIAGNOSIS — Z85820 Personal history of malignant melanoma of skin: Secondary | ICD-10-CM | POA: Diagnosis not present

## 2017-02-19 DIAGNOSIS — R413 Other amnesia: Secondary | ICD-10-CM

## 2017-02-19 DIAGNOSIS — L821 Other seborrheic keratosis: Secondary | ICD-10-CM | POA: Diagnosis not present

## 2017-02-19 MED ORDER — IOPAMIDOL (ISOVUE-300) INJECTION 61%
75.0000 mL | Freq: Once | INTRAVENOUS | Status: AC | PRN
  Administered 2017-02-19: 75 mL via INTRAVENOUS

## 2017-03-31 DIAGNOSIS — R945 Abnormal results of liver function studies: Secondary | ICD-10-CM | POA: Diagnosis not present

## 2017-08-20 ENCOUNTER — Other Ambulatory Visit: Payer: Self-pay | Admitting: Internal Medicine

## 2017-08-20 DIAGNOSIS — R413 Other amnesia: Secondary | ICD-10-CM | POA: Diagnosis not present

## 2017-08-20 DIAGNOSIS — I6521 Occlusion and stenosis of right carotid artery: Secondary | ICD-10-CM | POA: Diagnosis not present

## 2017-08-20 DIAGNOSIS — E782 Mixed hyperlipidemia: Secondary | ICD-10-CM | POA: Diagnosis not present

## 2017-08-20 DIAGNOSIS — Z7189 Other specified counseling: Secondary | ICD-10-CM | POA: Diagnosis not present

## 2017-08-20 DIAGNOSIS — M1A9XX1 Chronic gout, unspecified, with tophus (tophi): Secondary | ICD-10-CM | POA: Diagnosis not present

## 2017-08-20 DIAGNOSIS — I10 Essential (primary) hypertension: Secondary | ICD-10-CM | POA: Diagnosis not present

## 2017-08-20 DIAGNOSIS — Z1389 Encounter for screening for other disorder: Secondary | ICD-10-CM | POA: Diagnosis not present

## 2017-08-20 DIAGNOSIS — Z0000 Encounter for general adult medical examination without abnormal findings: Secondary | ICD-10-CM | POA: Diagnosis not present

## 2017-08-20 DIAGNOSIS — R7301 Impaired fasting glucose: Secondary | ICD-10-CM | POA: Diagnosis not present

## 2017-08-26 ENCOUNTER — Ambulatory Visit
Admission: RE | Admit: 2017-08-26 | Discharge: 2017-08-26 | Disposition: A | Discharge status: Home / Self Care | Payer: Medicare HMO | Source: Ambulatory Visit | Attending: Internal Medicine | Admitting: Internal Medicine

## 2017-08-26 DIAGNOSIS — I6521 Occlusion and stenosis of right carotid artery: Secondary | ICD-10-CM | POA: Diagnosis not present

## 2017-12-18 DIAGNOSIS — F419 Anxiety disorder, unspecified: Secondary | ICD-10-CM | POA: Diagnosis not present

## 2017-12-18 DIAGNOSIS — I10 Essential (primary) hypertension: Secondary | ICD-10-CM | POA: Diagnosis not present

## 2017-12-23 ENCOUNTER — Other Ambulatory Visit: Payer: Self-pay | Admitting: Internal Medicine

## 2017-12-23 DIAGNOSIS — R011 Cardiac murmur, unspecified: Secondary | ICD-10-CM | POA: Diagnosis not present

## 2017-12-23 DIAGNOSIS — R29898 Other symptoms and signs involving the musculoskeletal system: Secondary | ICD-10-CM

## 2017-12-23 DIAGNOSIS — R413 Other amnesia: Secondary | ICD-10-CM

## 2017-12-23 DIAGNOSIS — G3184 Mild cognitive impairment, so stated: Secondary | ICD-10-CM | POA: Diagnosis not present

## 2017-12-23 DIAGNOSIS — R490 Dysphonia: Secondary | ICD-10-CM | POA: Diagnosis not present

## 2017-12-23 DIAGNOSIS — R4701 Aphasia: Secondary | ICD-10-CM

## 2017-12-23 DIAGNOSIS — I10 Essential (primary) hypertension: Secondary | ICD-10-CM | POA: Diagnosis not present

## 2017-12-23 DIAGNOSIS — R5383 Other fatigue: Secondary | ICD-10-CM | POA: Diagnosis not present

## 2017-12-30 ENCOUNTER — Ambulatory Visit
Admission: RE | Admit: 2017-12-30 | Discharge: 2017-12-30 | Disposition: A | Discharge status: Home / Self Care | Payer: Medicare HMO | Source: Ambulatory Visit | Attending: Internal Medicine | Admitting: Internal Medicine

## 2017-12-30 DIAGNOSIS — R29898 Other symptoms and signs involving the musculoskeletal system: Secondary | ICD-10-CM

## 2017-12-30 DIAGNOSIS — R4701 Aphasia: Secondary | ICD-10-CM

## 2017-12-30 DIAGNOSIS — R413 Other amnesia: Secondary | ICD-10-CM

## 2017-12-31 ENCOUNTER — Ambulatory Visit
Admission: RE | Admit: 2017-12-31 | Discharge: 2017-12-31 | Disposition: A | Discharge status: Home / Self Care | Payer: Medicare HMO | Source: Ambulatory Visit | Attending: Internal Medicine | Admitting: Internal Medicine

## 2017-12-31 DIAGNOSIS — R413 Other amnesia: Secondary | ICD-10-CM | POA: Diagnosis not present

## 2017-12-31 MED ORDER — GADOBENATE DIMEGLUMINE 529 MG/ML IV SOLN
15.0000 mL | Freq: Once | INTRAVENOUS | Status: AC | PRN
  Administered 2017-12-31: 15 mL via INTRAVENOUS

## 2018-01-07 DIAGNOSIS — R490 Dysphonia: Secondary | ICD-10-CM | POA: Diagnosis not present

## 2018-01-07 DIAGNOSIS — I10 Essential (primary) hypertension: Secondary | ICD-10-CM | POA: Diagnosis not present

## 2018-01-07 DIAGNOSIS — G3184 Mild cognitive impairment, so stated: Secondary | ICD-10-CM | POA: Diagnosis not present

## 2018-01-07 DIAGNOSIS — R269 Unspecified abnormalities of gait and mobility: Secondary | ICD-10-CM | POA: Diagnosis not present

## 2018-01-07 DIAGNOSIS — J309 Allergic rhinitis, unspecified: Secondary | ICD-10-CM | POA: Diagnosis not present

## 2018-02-09 DIAGNOSIS — J020 Streptococcal pharyngitis: Secondary | ICD-10-CM | POA: Diagnosis not present

## 2018-02-09 DIAGNOSIS — H6501 Acute serous otitis media, right ear: Secondary | ICD-10-CM | POA: Diagnosis not present

## 2018-02-09 DIAGNOSIS — H109 Unspecified conjunctivitis: Secondary | ICD-10-CM | POA: Diagnosis not present

## 2018-03-07 ENCOUNTER — Other Ambulatory Visit: Payer: Self-pay

## 2018-03-07 ENCOUNTER — Emergency Department: Payer: Medicare HMO

## 2018-03-07 ENCOUNTER — Emergency Department
Admission: EM | Admit: 2018-03-07 | Discharge: 2018-03-07 | Disposition: A | Discharge status: Home / Self Care | Payer: Medicare HMO | Attending: Emergency Medicine | Admitting: Emergency Medicine

## 2018-03-07 ENCOUNTER — Encounter: Payer: Self-pay | Admitting: Emergency Medicine

## 2018-03-07 DIAGNOSIS — S299XXA Unspecified injury of thorax, initial encounter: Secondary | ICD-10-CM | POA: Diagnosis present

## 2018-03-07 DIAGNOSIS — Z79899 Other long term (current) drug therapy: Secondary | ICD-10-CM | POA: Insufficient documentation

## 2018-03-07 DIAGNOSIS — W108XXA Fall (on) (from) other stairs and steps, initial encounter: Secondary | ICD-10-CM | POA: Insufficient documentation

## 2018-03-07 DIAGNOSIS — Y998 Other external cause status: Secondary | ICD-10-CM | POA: Diagnosis not present

## 2018-03-07 DIAGNOSIS — Z7982 Long term (current) use of aspirin: Secondary | ICD-10-CM | POA: Insufficient documentation

## 2018-03-07 DIAGNOSIS — Y9289 Other specified places as the place of occurrence of the external cause: Secondary | ICD-10-CM | POA: Insufficient documentation

## 2018-03-07 DIAGNOSIS — Z87891 Personal history of nicotine dependence: Secondary | ICD-10-CM | POA: Insufficient documentation

## 2018-03-07 DIAGNOSIS — I10 Essential (primary) hypertension: Secondary | ICD-10-CM | POA: Diagnosis not present

## 2018-03-07 DIAGNOSIS — S2241XA Multiple fractures of ribs, right side, initial encounter for closed fracture: Secondary | ICD-10-CM

## 2018-03-07 DIAGNOSIS — Y939 Activity, unspecified: Secondary | ICD-10-CM | POA: Diagnosis not present

## 2018-03-07 DIAGNOSIS — E785 Hyperlipidemia, unspecified: Secondary | ICD-10-CM | POA: Insufficient documentation

## 2018-03-07 MED ORDER — OXYCODONE-ACETAMINOPHEN 5-325 MG PO TABS: 1.0000 | ORAL_TABLET | Freq: Four times a day (QID) | ORAL | 0 refills | Status: DC | PRN

## 2018-03-07 MED ORDER — OXYCODONE-ACETAMINOPHEN 5-325 MG PO TABS
1.0000 | ORAL_TABLET | Freq: Once | ORAL | Status: AC
  Administered 2018-03-07: 1 via ORAL
  Filled 2018-03-07: qty 1

## 2018-03-07 NOTE — ED Triage Notes (Addendum)
Pt says she was at her granddaughter's performance in [place] and fell going down steps in the theater; says she fell to the right and landed on the wooden arm of seats and then to the floor; c/o right outer rib pain, tender on palpation; pain worse with deep inspiration and movement; no bruising or swelling noted; pt awake and alert, talking in complete coherent sentences

## 2018-03-07 NOTE — ED Notes (Signed)
Pt c/o right rib pain after fall. More comfortable sitting up in the w/c

## 2018-03-07 NOTE — ED Provider Notes (Signed)
[facility] Emergency Department Provider Note  ____________________________________________  Time seen: Approximately 9:15 PM  I have reviewed the triage vital signs and the nursing notes.   HISTORY  Chief Complaint No chief complaint on file.    HPI Katelyn Powers is a 84 y.o. female who presents the emergency department complaining of right rib pain.  Patient was at a dance recital of her granddaughters when she missed a step and landed on seat back with her right ribs.  Patient did not hit her head or lose consciousness.  Patient does report worsening right anterolateral rib pain.  Patient denies any shortness of breath or difficulty breathing.  She denies any headache, neck pain, chest pain, abdominal pain, nausea or vomiting.  No medications for this complaint prior to arrival.  Patient's medical history of cataract, gout, hypertension is noncontributory at this visit.  No other complaints at this time.  Past Medical History:  Diagnosis Date  . Cataract   . Gout   . Hyperlipidemia   . Hypertension     There are no active problems to display for this patient.   Past Surgical History:  Procedure Laterality Date  . MELANOMA EXCISION Right 1972   level 2  . 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 Authorizing Provider  allopurinol (ZYLOPRIM) 300 MG tablet Take 300 mg by mouth daily.   Yes [provider]  amLODipine (NORVASC) 10 MG tablet Take 10 mg by mouth daily.   Yes [provider]  aspirin 81 MG tablet Take 81 mg by mouth daily.   Yes [provider]  atorvastatin (LIPITOR) 20 MG tablet Take 20 mg by mouth daily.   Yes [provider]  Cholecalciferol (VITAMIN D-3 PO) Take by mouth.   Yes [provider]  ibuprofen (ADVIL,MOTRIN) 200 MG tablet Take 200 mg by mouth daily.   Yes [provider]  oxyCODONE-acetaminophen (PERCOCET/ROXICET) 5-325 MG tablet Take 1 tablet by mouth every 6 (six) hours as needed for severe pain. 03/07/18   Cuthriell, Charline Bills, PA-C    Allergies Patient has no known allergies.  Family History  Problem Relation Age of Onset  . Colon cancer Sister 69  . Colon polyps Brother     Social History Social History   Tobacco Use  . Smoking status: Former Research scientist (life sciences)  . Smokeless tobacco: Never Used  Substance Use Topics  . Alcohol use: No    Alcohol/week: 0.0 oz  . Drug use: No     Review of Systems  Constitutional: No fever/chills Eyes: No visual changes. No discharge ENT: No upper respiratory complaints. Cardiovascular: no chest pain. Respiratory: no cough. No SOB. Gastrointestinal: No abdominal pain.  No nausea, no vomiting.  Musculoskeletal: Positive for right anterolateral rib pain Skin: Negative for rash, abrasions, lacerations, ecchymosis. Neurological: Negative for headaches, focal weakness or numbness. 10-point ROS otherwise negative.  ____________________________________________   PHYSICAL EXAM:  VITAL SIGNS: ED Triage Vitals  Enc Vitals Group     BP 03/07/18 2048 (!) 159/69     Pulse Rate 03/07/18 2048 78     Resp 03/07/18 2048 18     Temp 03/07/18 2048 98.4 F (36.9 C)     Temp Source 03/07/18 2048 Oral     SpO2 03/07/18 2048 96 %     Weight 03/07/18 2054 160 lb (72.6 kg)  Height 03/07/18 2054 5\' 1"  (1.549 m)     Head Circumference --      Peak Flow --      Pain Score 03/07/18 2052 7     Pain Loc --      Pain Edu? --      Excl. in [place]? --      Constitutional: Alert and oriented. Well appearing and in no acute distress. Eyes: Conjunctivae are normal. PERRL. EOMI. Head: Atraumatic. Neck: No stridor.  No cervical spine tenderness to palpation.  Cardiovascular: Normal rate, regular rhythm. Normal S1 and S2.  Good peripheral circulation. Respiratory: Normal respiratory effort without tachypnea or  retractions. Lungs CTAB. Good air entry to the bases with no decreased or absent breath sounds. Gastrointestinal: Bowel sounds 4 quadrants. Soft and nontender to palpation. No guarding or rigidity. No palpable masses. No distention.  Musculoskeletal: Full range of motion to all extremities. No gross deformities appreciated.  No visible deformity, ecchymosis, edema, lacerations or abrasions noted to the right rib cage.  Equal chest rise and fall.  No paradoxical chest wall movement.  Patient is diffusely tender to palpation in ribs 6 through 12 right anterolateral rib cage.  No palpable abnormality.  Good underlying breath sounds bilaterally. Neurologic:  Normal speech and language. No gross focal neurologic deficits are appreciated.  Skin:  Skin is warm, dry and intact. No rash noted. Psychiatric: Mood and affect are normal. Speech and behavior are normal. Patient exhibits appropriate insight and judgement.   ____________________________________________   LABS (all labs ordered are listed, but only abnormal results are displayed)  Labs Reviewed - No data to display ____________________________________________  EKG   ____________________________________________  RADIOLOGY Diamantina Providence Cuthriell, personally viewed and evaluated these images (plain radiographs) as part of my medical decision making, as well as reviewing the written report by the radiologist.  Dg Ribs Unilateral W/chest Right  Result Date: 03/07/2018 CLINICAL DATA:  Right axillary pain after fall and landing on arm rest today. EXAM: RIGHT RIBS AND CHEST - 3+ VIEW COMPARISON:  12/07/2010 CXR FINDINGS: Acute right seventh through tenth rib fractures without associated pneumothorax or hemothorax. Thoracolumbar spondylosis with levoconvex curvature of the lumbar spine. Heart and mediastinal contours are within normal limits with aortic atherosclerosis redemonstrated. Axillary clips are present on the right. IMPRESSION: Acute  anterior right seventh through tenth rib fractures without associated pulmonary abnormality. Electronically Signed   By: Ashley Royalty M.D.   On: 03/07/2018 21:37    ____________________________________________    PROCEDURES  Procedure(s) performed:    Procedures    Medications  oxyCODONE-acetaminophen (PERCOCET/ROXICET) 5-325 MG per tablet 1 tablet (has no administration in time range)     ____________________________________________   INITIAL IMPRESSION / ASSESSMENT AND PLAN / ED COURSE  Pertinent labs & imaging results that were available during my care of the patient were reviewed by me and considered in my medical decision making (see chart for details).  Review of the [place] CSRS was performed in accordance of the [place] prior to dispensing any controlled drugs.     Patient's diagnosis is consistent with multiple rib fractures to the right anterolateral rib cage.  Patient presents after a fall onto a hard seat back.  Patient complaining of right rib pain.  No associated shortness of breath.  Exam is reassuring.  X-rays reveal fractures of the seventh through 10th rib right rib cage.  No cardiopulmonary abnormality appreciated on x-ray.. Patient will be discharged home with prescriptions for pain medication.  Patient is  cautioned while using pain medicine for potential side effects of drowsiness, dizziness, weakness.  Until patient is aware of the effects of medication, 1 of her daughters will stay with her.. Patient is to follow up with primary care as needed or otherwise directed. Patient is given ED precautions to return to the ED for any worsening or new symptoms.     ____________________________________________  FINAL CLINICAL IMPRESSION(S) / ED DIAGNOSES  Final diagnoses:  Closed fracture of multiple ribs of right side, initial encounter      NEW MEDICATIONS STARTED DURING THIS VISIT:  ED Discharge Orders        Ordered    oxyCODONE-acetaminophen  (PERCOCET/ROXICET) 5-325 MG tablet  Every 6 hours PRN     03/07/18 2213          This chart was dictated using voice recognition software/Dragon. Despite best efforts to proofread, errors can occur which can change the meaning. Any change was purely unintentional.    Darletta Moll, PA-C 03/07/18 2213    Orbie Pyo, MD 03/07/18 2328

## 2018-03-20 DIAGNOSIS — W19XXXA Unspecified fall, initial encounter: Secondary | ICD-10-CM | POA: Diagnosis not present

## 2018-03-20 DIAGNOSIS — I10 Essential (primary) hypertension: Secondary | ICD-10-CM | POA: Diagnosis not present

## 2018-03-20 DIAGNOSIS — S2231XA Fracture of one rib, right side, initial encounter for closed fracture: Secondary | ICD-10-CM | POA: Diagnosis not present

## 2018-03-20 DIAGNOSIS — R490 Dysphonia: Secondary | ICD-10-CM | POA: Diagnosis not present

## 2018-04-01 DIAGNOSIS — I6521 Occlusion and stenosis of right carotid artery: Secondary | ICD-10-CM | POA: Diagnosis not present

## 2018-04-01 DIAGNOSIS — Z6829 Body mass index (BMI) 29.0-29.9, adult: Secondary | ICD-10-CM | POA: Diagnosis not present

## 2018-04-01 DIAGNOSIS — R2681 Unsteadiness on feet: Secondary | ICD-10-CM | POA: Diagnosis not present

## 2018-04-01 DIAGNOSIS — I350 Nonrheumatic aortic (valve) stenosis: Secondary | ICD-10-CM | POA: Diagnosis not present

## 2018-04-03 ENCOUNTER — Telehealth: Payer: Self-pay

## 2018-04-03 NOTE — Telephone Encounter (Signed)
Notes sent to scheduling for appointment from [facility].

## 2018-05-27 ENCOUNTER — Ambulatory Visit: Payer: Medicare HMO | Admitting: Interventional Cardiology

## 2018-05-27 ENCOUNTER — Encounter: Payer: Self-pay | Admitting: Interventional Cardiology

## 2018-05-27 VITALS — BP 132/70 | HR 84 | Ht 62.0 in | Wt 160.0 lb

## 2018-05-27 DIAGNOSIS — I739 Peripheral vascular disease, unspecified: Secondary | ICD-10-CM

## 2018-05-27 DIAGNOSIS — I350 Nonrheumatic aortic (valve) stenosis: Secondary | ICD-10-CM | POA: Diagnosis not present

## 2018-05-27 DIAGNOSIS — I779 Disorder of arteries and arterioles, unspecified: Secondary | ICD-10-CM

## 2018-05-27 NOTE — Patient Instructions (Signed)
Medication Instructions:  Your physician recommends that you continue on your current medications as directed. Please refer to the Current Medication list given to you today.  Labwork: None  Testing/Procedures: Your physician has requested that you have an echocardiogram. Echocardiography is a painless test that uses sound waves to create images of your heart. It provides your doctor with information about the size and shape of your heart and how well your heart's chambers and valves are working. This procedure takes approximately one hour. There are no restrictions for this procedure.   Follow-Up: Your physician recommends that you schedule a follow-up appointment in: 3-4 months with [name].  You have been referred to [facility] for a consult on your carotid arteries.    Any Other Special Instructions Will Be Listed Below (If Applicable).     If you need a refill on your cardiac medications before your next appointment, please call your pharmacy.

## 2018-05-27 NOTE — Addendum Note (Signed)
Addended by: Belva Crome on: 05/27/2018 03:49 PM   Modules accepted: Level of Service

## 2018-05-27 NOTE — Progress Notes (Addendum)
Cardiology Office Note:    Date:  05/27/2018   ID:  Katelyn Powers, DOB 1932-10-09, MRN [id number]  PCP:  Lavone Orn, MD  Cardiologist:  No primary care provider on file.   Referring MD: Alonna Buckler*   Chief Complaint  Patient presents with  . Advice Only    Aortic stenosis    History of Present Illness:    Katelyn Powers is a 84 y.o. female with a hx of mild aortic stenosis and right carotid artery disease who is referred for cardiac evaluation by Dr. Wyvonnia Lora to exclude progression of aortic stenosis.  The patient had a recent fall.  There was concern that perhaps she could have syncope.  In taking a careful history it sounds as though her environment did not have good lighting and she missed her step.  She does note some progressive shortness of breath over the last year or so.  She denies chest pain.  She has never had an episode of syncope.  She was noted by a recent vascular Doppler study of the neck to have carotid obstruction between 70 and 99%.  She has no neurological complaints.  Specifically denies the symptoms in right eye or left body transient neurological symptoms.  Past Medical History:  Diagnosis Date  . Cataract   . Gout   . Hyperlipidemia   . Hypertension     Past Surgical History:  Procedure Laterality Date  . MELANOMA EXCISION Right 1972   level 2  . 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 allopurinol (ZYLOPRIM) 300 MG tablet Take 300 mg by mouth daily.  Marland Kitchen amLODipine (NORVASC) 10 MG tablet Take 10 mg by mouth daily.  Marland Kitchen aspirin 81 MG tablet Take 81 mg by mouth daily.  Marland Kitchen atorvastatin (LIPITOR) 20 MG tablet Take 20 mg by mouth daily.  . Cholecalciferol (VITAMIN D-3 PO) Take by mouth.  Marland Kitchen ibuprofen (ADVIL,MOTRIN) 200 MG tablet Take 200 mg by mouth every 6 (six) hours as needed.   Marland Kitchen losartan (COZAAR) 100 MG tablet Take 100 mg by mouth daily.      Allergies:    Patient has no known allergies.   Social History   Socioeconomic History  . Marital status: Widowed    Spouse name: Not on file  . Number of children: Not on file  . Years of education: Not on file  . Highest education level: Not on file  Occupational History  . Not on file  Social Needs  . Financial resource strain: Not on file  . Food insecurity:    Worry: Not on file    Inability: Not on file  . Transportation needs:    Medical: Not on file    Non-medical: Not on file  Tobacco Use  . Smoking status: Former Research scientist (life sciences)  . Smokeless tobacco: Never Used  Substance and Sexual Activity  . Alcohol use: No    Alcohol/week: 0.0 oz  . Drug use: No  . Sexual activity: Not on file  Lifestyle  . Physical activity:    Days per week: Not on file    Minutes per session: Not on file  . Stress: Not on file  Relationships  . Social connections:    Talks on phone: Not on file    Gets together: Not on file    Attends religious service: Not on file    Active member of club or organization:  Not on file    Attends meetings of clubs or organizations: Not on file    Relationship status: Not on file  Other Topics Concern  . Not on file  Social History Narrative  . Not on file     Family History: The patient's family history includes Colon cancer (age of onset: 63) in her sister; Colon polyps in her brother.  ROS:   Please see the history of present illness.    Some mild difficulty with her memory.  Joint discomfort including knees and hips.  Occasional headaches.  All other systems reviewed and are negative.  EKGs/Labs/Other Studies Reviewed:    The following studies were reviewed today: Reviewed the note furnished by her primary physician at [facility] which speaks to concerned about her heart murmur and right internal carotid obstruction.  EKG:  EKG is not ordered today.  The ekg performed by the primary physician on 04/01/2018 demonstrates sinus rhythm at  71 bpm, poor R wave progression V1 through V3, nonspecific T wave flattening.  Recent Labs: No results found for requested labs within last 8760 hours.  Recent Lipid Panel No results found for: CHOL, TRIG, HDL, CHOLHDL, VLDL, LDLCALC, LDLDIRECT  Physical Exam:    VS:  BP 132/70   Pulse 84   Ht 5\' 2"  (1.575 m)   Wt 160 lb (72.6 kg)   BMI 29.26 kg/m     Wt Readings from Last 3 Encounters:  05/27/18 160 lb (72.6 kg)  03/07/18 160 lb (72.6 kg)  09/15/14 176 lb (79.8 kg)     GEN: Elderly and appearing younger than stated age.  Mildly obese.  Well nourished, well developed in no acute distress HEENT: Normal NECK: No JVD. LYMPHATICS: No lymphadenopathy CARDIAC: 3/6 crescendo decrescendo RRR, right upper sternal border systolic murmur.  No diastolic murmurs heard.,  No gallop, no edema. VASCULAR: Radial and posterior tibial pulses are 2+.  . High-pitched right carotid bruit. RESPIRATORY:  Clear to auscultation without rales, wheezing or rhonchi  ABDOMEN: Soft, non-tender, non-distended, No pulsatile mass, MUSCULOSKELETAL: No deformity  SKIN: Warm and dry NEUROLOGIC:  Alert and oriented x 3 PSYCHIATRIC:  Normal affect   ASSESSMENT:    1. Aortic valve stenosis, etiology of cardiac valve disease unspecified   2. Bilateral carotid artery disease, unspecified type ([place])    PLAN:    In order of problems listed above:  1. Progression of aortic stenosis is likely occurred.  The murmur is easily audible.  Carotid upstroke is normal.  We will perform a repeat Doppler echocardiogram to quantitate severity of aortic valve disease. 2. Needs referral to vascular surgery for further evaluation of right carotid stenosis.  Will likely need an imaging study such as CT Angiogram. Management strategy based upon findings.   Medication Adjustments/Labs and Tests Ordered: Current medicines are reviewed at length with the patient today.  Concerns regarding medicines are outlined above.  Orders  Placed This Encounter  Procedures  . Ambulatory referral to Vascular Surgery  . ECHOCARDIOGRAM COMPLETE   No orders of the defined types were placed in this encounter.   Patient Instructions  Medication Instructions:  Your physician recommends that you continue on your current medications as directed. Please refer to the Current Medication list given to you today.  Labwork: None  Testing/Procedures: Your physician has requested that you have an echocardiogram. Echocardiography is a painless test that uses sound waves to create images of your heart. It provides your doctor with information about the  size and shape of your heart and how well your heart's chambers and valves are working. This procedure takes approximately one hour. There are no restrictions for this procedure.   Follow-Up: Your physician recommends that you schedule a follow-up appointment in: 3-4 months with [name].  You have been referred to [facility] V[facility] for a consult on your carotid arteries.    Any Other Special Instructions Will Be Listed Below (If Applicable).     If you need a refill on your cardiac medications before your next appointment, please call your pharmacy.      Signed, Sinclair Grooms, MD  05/27/2018 3:44 PM    [place]

## 2018-05-28 ENCOUNTER — Other Ambulatory Visit: Payer: Self-pay

## 2018-05-28 DIAGNOSIS — I6523 Occlusion and stenosis of bilateral carotid arteries: Secondary | ICD-10-CM

## 2018-06-03 ENCOUNTER — Ambulatory Visit (HOSPITAL_COMMUNITY): Payer: Medicare HMO | Attending: Cardiology

## 2018-06-03 ENCOUNTER — Other Ambulatory Visit: Payer: Self-pay

## 2018-06-03 DIAGNOSIS — I350 Nonrheumatic aortic (valve) stenosis: Secondary | ICD-10-CM | POA: Insufficient documentation

## 2018-06-03 DIAGNOSIS — I340 Nonrheumatic mitral (valve) insufficiency: Secondary | ICD-10-CM | POA: Insufficient documentation

## 2018-06-03 DIAGNOSIS — E785 Hyperlipidemia, unspecified: Secondary | ICD-10-CM | POA: Insufficient documentation

## 2018-06-03 DIAGNOSIS — R0600 Dyspnea, unspecified: Secondary | ICD-10-CM | POA: Diagnosis not present

## 2018-06-03 DIAGNOSIS — I10 Essential (primary) hypertension: Secondary | ICD-10-CM | POA: Diagnosis not present

## 2018-06-13 ENCOUNTER — Emergency Department: Payer: Medicare HMO

## 2018-06-13 ENCOUNTER — Encounter: Payer: Self-pay | Admitting: Emergency Medicine

## 2018-06-13 ENCOUNTER — Emergency Department
Admission: EM | Admit: 2018-06-13 | Discharge: 2018-06-13 | Disposition: A | Discharge status: Home / Self Care | Payer: Medicare HMO | Attending: Emergency Medicine | Admitting: Emergency Medicine

## 2018-06-13 ENCOUNTER — Other Ambulatory Visit: Payer: Self-pay

## 2018-06-13 DIAGNOSIS — R413 Other amnesia: Secondary | ICD-10-CM

## 2018-06-13 DIAGNOSIS — Z79899 Other long term (current) drug therapy: Secondary | ICD-10-CM | POA: Insufficient documentation

## 2018-06-13 DIAGNOSIS — Z7982 Long term (current) use of aspirin: Secondary | ICD-10-CM | POA: Insufficient documentation

## 2018-06-13 DIAGNOSIS — I10 Essential (primary) hypertension: Secondary | ICD-10-CM | POA: Diagnosis not present

## 2018-06-13 DIAGNOSIS — Z87891 Personal history of nicotine dependence: Secondary | ICD-10-CM | POA: Insufficient documentation

## 2018-06-13 DIAGNOSIS — R4182 Altered mental status, unspecified: Secondary | ICD-10-CM | POA: Diagnosis not present

## 2018-06-13 LAB — CBC
HCT: 38.2 % (ref 35.0–47.0)
Hemoglobin: 13.1 g/dL (ref 12.0–16.0)
MCH: 32.5 pg (ref 26.0–34.0)
MCHC: 34.4 g/dL (ref 32.0–36.0)
MCV: 94.7 fL (ref 80.0–100.0)
Platelets: 180 10*3/uL (ref 150–440)
RBC: 4.04 MIL/uL (ref 3.80–5.20)
RDW: 15.1 % — AB (ref 11.5–14.5)
WBC: 9 10*3/uL (ref 3.6–11.0)

## 2018-06-13 LAB — URINALYSIS, COMPLETE (UACMP) WITH MICROSCOPIC
Bacteria, UA: NONE SEEN
Bilirubin Urine: NEGATIVE
Glucose, UA: 50 mg/dL — AB
HGB URINE DIPSTICK: NEGATIVE
Ketones, ur: NEGATIVE mg/dL
Leukocytes, UA: NEGATIVE
Nitrite: NEGATIVE
PROTEIN: 30 mg/dL — AB
SPECIFIC GRAVITY, URINE: 1.008 (ref 1.005–1.030)
Squamous Epithelial / LPF: NONE SEEN (ref 0–5)
pH: 6 (ref 5.0–8.0)

## 2018-06-13 LAB — TSH: TSH: 2.167 u[IU]/mL (ref 0.350–4.500)

## 2018-06-13 LAB — COMPREHENSIVE METABOLIC PANEL
ALT: 31 U/L (ref 0–44)
AST: 35 U/L (ref 15–41)
Albumin: 4.3 g/dL (ref 3.5–5.0)
Alkaline Phosphatase: 103 U/L (ref 38–126)
Anion gap: 9 (ref 5–15)
BUN: 18 mg/dL (ref 8–23)
CALCIUM: 9.9 mg/dL (ref 8.9–10.3)
CO2: 24 mmol/L (ref 22–32)
CREATININE: 0.81 mg/dL (ref 0.44–1.00)
Chloride: 108 mmol/L (ref 98–111)
Glucose, Bld: 185 mg/dL — ABNORMAL HIGH (ref 70–99)
Potassium: 3.6 mmol/L (ref 3.5–5.1)
Sodium: 141 mmol/L (ref 135–145)
Total Bilirubin: 1.5 mg/dL — ABNORMAL HIGH (ref 0.3–1.2)
Total Protein: 7.8 g/dL (ref 6.5–8.1)

## 2018-06-13 LAB — VITAMIN B12: VITAMIN B 12: 364 pg/mL (ref 180–914)

## 2018-06-13 LAB — TROPONIN I

## 2018-06-13 NOTE — Discharge Instructions (Signed)
Your work-up today was negative.  The vitamin B12 test is still pending and should be followed up on by your primary doctor.  Follow-up with the neurologist next month as scheduled.  Return to the ER for any new or worsening dizziness, weakness, difficulty walking, confusion, or any other new or worsening symptoms that concern you.

## 2018-06-13 NOTE — ED Notes (Signed)
Pt assisted to and from bathroom

## 2018-06-13 NOTE — ED Provider Notes (Signed)
[facility] Emergency Department Provider Note ____________________________________________   First MD Initiated Contact with Patient 06/13/18 1230     (approximate)  I have reviewed the triage vital signs and the nursing notes.   HISTORY  Chief Complaint Altered Mental Status    HPI Katelyn Powers is a 84 y.o. female with PMH as noted below who presents with multiple symptoms over the last few months.  She primarily reports dizziness, that she describes as feeling somewhat lightheaded as well as being a bit unsteady with her balance.  The patient and her family members also reports that she has had memory problems over the last 2 months, with difficulty remembering things like the ages were ordered of birth of family members, as well as difficulty finding words, and intermittent confusion.  The patient had a fall in May with her head possibly hitting a pillow or cushion, but no apparent head injury when she was evaluated in the hospital and no imaging at that time.   Past Medical History:  Diagnosis Date  . Cataract   . Gout   . Hyperlipidemia   . Hypertension     There are no active problems to display for this patient.   Past Surgical History:  Procedure Laterality Date  . MELANOMA EXCISION Right 1972   level 2  . 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 Authorizing Provider  allopurinol (ZYLOPRIM) 300 MG tablet Take 300 mg by mouth daily.    [provider]  amLODipine (NORVASC) 10 MG tablet Take 10 mg by mouth daily.    [provider]  aspirin 81 MG tablet Take 81 mg by mouth daily.    [provider]  atorvastatin (LIPITOR) 20 MG tablet Take 20 mg by mouth daily.    [provider]  Cholecalciferol (VITAMIN D-3 PO) Take by mouth.    [provider]  ibuprofen (ADVIL,MOTRIN) 200 MG tablet Take  200 mg by mouth every 6 (six) hours as needed.     [provider]  losartan (COZAAR) 100 MG tablet Take 100 mg by mouth daily.  04/13/18   [provider]    Allergies Patient has no known allergies.  Family History  Problem Relation Age of Onset  . Colon cancer Sister 5  . Colon polyps Brother     Social History Social History   Tobacco Use  . Smoking status: Former Research scientist (life sciences)  . Smokeless tobacco: Never Used  Substance Use Topics  . Alcohol use: No    Alcohol/week: 0.0 standard drinks  . Drug use: No    Review of Systems  Constitutional: No fever. Eyes: No visual changes. ENT: No sore throat. Cardiovascular: Denies chest pain. Respiratory: Denies shortness of breath. Gastrointestinal: No vomiting or diarrhea.  Genitourinary: Negative for dysuria.  Musculoskeletal: Negative for back pain. Skin: Negative for rash. Neurological: Negative for headache.   ____________________________________________   PHYSICAL EXAM:  VITAL SIGNS: ED Triage Vitals  Enc Vitals Group     BP 06/13/18 1207 (!) 160/60     Pulse Rate 06/13/18 1207 86     Resp 06/13/18 1207 16     Temp 06/13/18 1207 97.7 F (36.5 C)     Temp Source 06/13/18 1207 Oral     SpO2 06/13/18 1207 97 %     Weight 06/13/18 1208 160 lb (72.6 kg)  Height --      Head Circumference --      Peak Flow --      Pain Score 06/13/18 1213 0     Pain Loc --      Pain Edu? --      Excl. in [place]? --     Constitutional: Alert and oriented. Well appearing and in no acute distress. Eyes: Conjunctivae are normal.  EOMI.  PERRLA. Head: Atraumatic. Nose: No congestion/rhinnorhea. Mouth/Throat: Mucous membranes are moist.   Neck: Normal range of motion.  Cardiovascular: Normal rate, regular rhythm. Grossly normal heart sounds.  Good peripheral circulation. Respiratory: Normal respiratory effort.  No retractions. Lungs CTAB. Gastrointestinal: Soft and nontender. No distention.  Genitourinary: No CVA  tenderness. Musculoskeletal: No lower extremity edema.  Extremities warm and well perfused.  Neurologic:  Normal speech and language.  Motor and sensory intact in all extremities.  Normal coordination.  No gross focal neurologic deficits are appreciated.  Skin:  Skin is warm and dry. No rash noted. Psychiatric: Mood and affect are normal. Speech and behavior are normal.  ____________________________________________   LABS (all labs ordered are listed, but only abnormal results are displayed)  Labs Reviewed  COMPREHENSIVE METABOLIC PANEL - Abnormal; Notable for the following components:      Result Value   Glucose, Bld 185 (*)    Total Bilirubin 1.5 (*)    All other components within normal limits  CBC - Abnormal; Notable for the following components:   RDW 15.1 (*)    All other components within normal limits  URINALYSIS, COMPLETE (UACMP) WITH MICROSCOPIC - Abnormal; Notable for the following components:   Color, Urine STRAW (*)    APPearance CLEAR (*)    Glucose, UA 50 (*)    Protein, ur 30 (*)    All other components within normal limits  TROPONIN I  TSH  VITAMIN B12   ____________________________________________  EKG  ED ECG REPORT I, Arta Silence, the attending physician, personally viewed and interpreted this ECG.  Date: 06/13/2018 EKG Time: 1218 Rate: 82 Rhythm: normal sinus rhythm QRS Axis: normal Intervals: normal ST/T Wave abnormalities: normal Narrative Interpretation: no evidence of acute ischemia  ____________________________________________  RADIOLOGY  CT head: No ICH or other acute abnormalities  ____________________________________________   PROCEDURES  Procedure(s) performed: No  Procedures  Critical Care performed: No ____________________________________________   INITIAL IMPRESSION / ASSESSMENT AND PLAN / ED COURSE  Pertinent labs & imaging results that were available during my care of the patient were reviewed by me and  considered in my medical decision making (see chart for details).  84 year old female with PMH as noted above presents with multiple symptoms over the last 2 months, primarily dizziness and some increased difficulty with balance, as well as memory problems and intermittent confusion.  The patient and family state that the symptoms started after she had a fall in May.  I reviewed the past medical records in Epic; the patient was seen in the ED on 03/07/2018 with rib fractures.  There was no evidence of significant head injury at that time, so she did not receive imaging of the brain.  On exam, the patient is comfortable appearing, vital signs are normal except for hypertension, neuro exam is nonfocal, and she is alert and oriented.  Differential includes subdural hemorrhage or other undiagnosed head injury, postconcussive syndrome, or possible symptoms relating to new onset of dementia.  Differential also includes electrolyte abnormality, renal insufficiency, thyroid disease, B12 deficiency, or less likely UTI.  We will obtain CT head, lab work-up for these possible causes, and reassess.  If negative work-up anticipate discharge home.  The patient has follow-up with neurology for next month.  ----------------------------------------- 3:32 PM on 06/13/2018 -----------------------------------------  The work-up has been negative.  The patient continues to appear comfortable.  I counseled the patient and her family members on the results of the work-up.  I also counseled the family on the possibility that the patient's symptoms may be the onset of dementia, and this could be further explored when she sees the neurologist.  The patient feels comfortable to go home.  Return precautions given, and she and the family members express understanding.  ____________________________________________   FINAL CLINICAL IMPRESSION(S) / ED DIAGNOSES  Final diagnoses:  Memory change      NEW MEDICATIONS  STARTED DURING THIS VISIT:  New Prescriptions   No medications on file     Note:  This document was prepared using Dragon voice recognition software and may include unintentional dictation errors.    Arta Silence, MD 06/13/18 [phone]

## 2018-06-13 NOTE — ED Triage Notes (Signed)
Pt presents to ED via POV with c/o AMS since May 11. [name] family reports pt had a fall and was evaluated for the fall, since then has had increasing confusion and memory loss. [name] family states patient has appt with neurologist at end of September. [name] family denies slurred speech, states increasing hesitation getting words out since May 11. Pt also c/o increasing frequency of "dizzy spells". Pt is alert and oriented to person, place, and situation, able to correct herself when asked about the date. Facial symmetry intact, equal grip strengths bilaterally.

## 2018-06-23 DIAGNOSIS — Z6829 Body mass index (BMI) 29.0-29.9, adult: Secondary | ICD-10-CM | POA: Diagnosis not present

## 2018-06-23 DIAGNOSIS — M1A9XX1 Chronic gout, unspecified, with tophus (tophi): Secondary | ICD-10-CM | POA: Diagnosis not present

## 2018-06-23 DIAGNOSIS — E785 Hyperlipidemia, unspecified: Secondary | ICD-10-CM | POA: Diagnosis not present

## 2018-06-23 DIAGNOSIS — I10 Essential (primary) hypertension: Secondary | ICD-10-CM | POA: Diagnosis not present

## 2018-06-23 DIAGNOSIS — R413 Other amnesia: Secondary | ICD-10-CM | POA: Diagnosis not present

## 2018-07-22 ENCOUNTER — Ambulatory Visit (HOSPITAL_COMMUNITY)
Admission: RE | Admit: 2018-07-22 | Discharge: 2018-07-22 | Disposition: A | Discharge status: Home / Self Care | Payer: Medicare HMO | Source: Ambulatory Visit | Attending: Vascular Surgery | Admitting: Vascular Surgery

## 2018-07-22 ENCOUNTER — Ambulatory Visit (INDEPENDENT_AMBULATORY_CARE_PROVIDER_SITE_OTHER): Payer: Medicare HMO | Admitting: Vascular Surgery

## 2018-07-22 ENCOUNTER — Other Ambulatory Visit: Payer: Self-pay

## 2018-07-22 ENCOUNTER — Encounter: Payer: Self-pay | Admitting: Vascular Surgery

## 2018-07-22 VITALS — BP 150/72 | HR 72 | Temp 97.4°F | Resp 16 | Ht 62.0 in | Wt 160.0 lb

## 2018-07-22 DIAGNOSIS — I6521 Occlusion and stenosis of right carotid artery: Secondary | ICD-10-CM | POA: Diagnosis not present

## 2018-07-22 DIAGNOSIS — I708 Atherosclerosis of other arteries: Secondary | ICD-10-CM | POA: Diagnosis not present

## 2018-07-22 DIAGNOSIS — Z87891 Personal history of nicotine dependence: Secondary | ICD-10-CM | POA: Insufficient documentation

## 2018-07-22 DIAGNOSIS — I6523 Occlusion and stenosis of bilateral carotid arteries: Secondary | ICD-10-CM | POA: Diagnosis not present

## 2018-07-22 NOTE — Progress Notes (Signed)
REASON FOR CONSULT:    Carotid disease.  The consult is requested by Dr. Daneen Schick.  HPI:   Katelyn Powers is a pleasant 84 y.o. female, who is referred with bilateral carotid disease.  I reviewed the records from the referring office.  The patient was seen on 05/27/2018.  She has a history of mild aortic stenosis and carotid disease.  Based on her exam it was felt that the aortic stenosis might have progressed some and she was set up for a echo.  Given her history of carotid disease she was also set up for vascular evaluation.  The patient is right-handed.  She denies any history of stroke, TIAs, expressive or receptive aphasia, or amaurosis fugax.  She is on aspirin and is on a statin.  Past Medical History:  Diagnosis Date  . Cataract   . Gout   . Hyperlipidemia   . Hypertension     Family History  Problem Relation Age of Onset  . Colon cancer Sister 66  . Colon polyps Brother     SOCIAL HISTORY: Social History   Socioeconomic History  . Marital status: Widowed    Spouse name: Not on file  . Number of children: Not on file  . Years of education: Not on file  . Highest education level: Not on file  Occupational History  . Not on file  Social Needs  . Financial resource strain: Not on file  . Food insecurity:    Worry: Not on file    Inability: Not on file  . Transportation needs:    Medical: Not on file    Non-medical: Not on file  Tobacco Use  . Smoking status: Former Research scientist (life sciences)  . Smokeless tobacco: Never Used  Substance and Sexual Activity  . Alcohol use: No    Alcohol/week: 0.0 standard drinks  . Drug use: No  . Sexual activity: Not on file  Lifestyle  . Physical activity:    Days per week: Not on file    Minutes per session: Not on file  . Stress: Not on file  Relationships  . Social connections:    Talks on phone: Not on file    Gets together: Not on file    Attends religious service: Not on file    Active member of club or organization: Not on  file    Attends meetings of clubs or organizations: Not on file    Relationship status: Not on file  . Intimate partner violence:    Fear of current or ex partner: Not on file    Emotionally abused: Not on file    Physically abused: Not on file    Forced sexual activity: Not on file  Other Topics Concern  . Not on file  Social History Narrative  . Not on file    No Known Allergies  Current Outpatient Medications  Medication Sig Dispense Refill  . amLODipine (NORVASC) 10 MG tablet Take 10 mg by mouth daily.    Marland Kitchen aspirin 81 MG tablet Take 81 mg by mouth daily.    Marland Kitchen atorvastatin (LIPITOR) 20 MG tablet Take 20 mg by mouth daily.    . Cholecalciferol (VITAMIN D3) 1000 units CAPS Take by mouth.    Marland Kitchen ibuprofen (ADVIL,MOTRIN) 200 MG tablet Take 200 mg by mouth every 6 (six) hours as needed.     Marland Kitchen losartan (COZAAR) 100 MG tablet Take 100 mg by mouth daily.      No current facility-administered medications for this  visit.     REVIEW OF SYSTEMS:  [X]  denotes positive finding, [ ]  denotes negative finding Cardiac  Comments:  Chest pain or chest pressure:    Shortness of breath upon exertion:    Short of breath when lying flat:    Irregular heart rhythm:        Vascular    Pain in calf, thigh, or hip brought on by ambulation:    Pain in feet at night that wakes you up from your sleep:     Blood clot in your veins:    Leg swelling:  x       Pulmonary    Oxygen at home:    Productive cough:     Wheezing:         Neurologic    Sudden weakness in arms or legs:     Sudden numbness in arms or legs:     Sudden onset of difficulty speaking or slurred speech:    Temporary loss of vision in one eye:     Problems with dizziness:  x       Gastrointestinal    Blood in stool:     Vomited blood:         Genitourinary    Burning when urinating:     Blood in urine:        Psychiatric    Major depression:         Hematologic    Bleeding problems:    Problems with blood clotting  too easily:        Skin    Rashes or ulcers:        Constitutional    Fever or chills:     PHYSICAL EXAM:   Vitals:   07/22/18 1418  BP: (!) 150/72  Pulse: 72  Resp: 16  Temp: (!) 97.4 F (36.3 C)  TempSrc: Oral  SpO2: 96%  Weight: 160 lb (72.6 kg)  Height: 5\' 2"  (1.575 m)    GENERAL: The patient is a well-nourished female, in no acute distress. The vital signs are documented above. CARDIAC: There is a regular rate and rhythm.  She has a systolic ejection murmur. VASCULAR: She has a right carotid bruit. I cannot palpate pedal pulses however she has a biphasic dorsalis pedis and posterior tibial signal bilaterally. She has mild bilateral lower extremity swelling. PULMONARY: There is good air exchange bilaterally without wheezing or rales. ABDOMEN: Soft and non-tender with normal pitched bowel sounds.  MUSCULOSKELETAL: There are no major deformities or cyanosis. NEUROLOGIC: No focal weakness or paresthesias are detected. SKIN: There are no ulcers or rashes noted. PSYCHIATRIC: The patient has a normal affect.  DATA:    CAROTID DUPLEX: I have inability interpreted her carotid duplex scan today.  On the right side there is a 60 to 79% proximal ICA stenosis.  On the left side there is a less than 39% stenosis.  Both vertebral arteries are patent with antegrade flow.  ECHO: I reviewed the echo from 06/03/2018.  This showed that she has evidence of mild left ventricular hypertrophy.  Systolic function was normal.  Ejection fraction was 55 to 60%.  She had sclerosis of the aortic valve without significant stenosis.  There was very mild stenosis with a peak velocity less than 2 mm/s.  ASSESSMENT & PLAN:   ASYMPTOMATIC 60 TO 79% RIGHT CAROTID STENOSIS: This patient has an asymptomatic 60 to 79% right carotid stenosis. I explained we would not consider elective carotid endarterectomy unless the right  carotid stenosis became symptomatic or if it progressed to greater than 80%.  I  have ordered a follow-up carotid duplex scan in 6 months and I will see her back at that time.  We have discussed the importance of nutrition and exercise.  She is on aspirin and is on a statin.  She is not a smoker.  I will see her back in 6 months and that she call sooner.  Deitra Mayo [facility] [phone]

## 2018-07-23 ENCOUNTER — Ambulatory Visit: Payer: Medicare HMO | Admitting: Neurology

## 2018-07-23 ENCOUNTER — Encounter

## 2018-07-27 ENCOUNTER — Encounter

## 2018-07-27 ENCOUNTER — Encounter: Payer: Self-pay | Admitting: Neurology

## 2018-07-27 ENCOUNTER — Ambulatory Visit: Payer: Medicare HMO | Admitting: Neurology

## 2018-07-27 VITALS — BP 145/77 | HR 76 | Ht 62.0 in | Wt 160.0 lb

## 2018-07-27 DIAGNOSIS — G4700 Insomnia, unspecified: Secondary | ICD-10-CM

## 2018-07-27 DIAGNOSIS — I10 Essential (primary) hypertension: Secondary | ICD-10-CM | POA: Insufficient documentation

## 2018-07-27 DIAGNOSIS — E785 Hyperlipidemia, unspecified: Secondary | ICD-10-CM | POA: Diagnosis not present

## 2018-07-27 DIAGNOSIS — R9089 Other abnormal findings on diagnostic imaging of central nervous system: Secondary | ICD-10-CM | POA: Diagnosis not present

## 2018-07-27 DIAGNOSIS — R413 Other amnesia: Secondary | ICD-10-CM | POA: Insufficient documentation

## 2018-07-27 DIAGNOSIS — I779 Disorder of arteries and arterioles, unspecified: Secondary | ICD-10-CM | POA: Insufficient documentation

## 2018-07-27 DIAGNOSIS — E782 Mixed hyperlipidemia: Secondary | ICD-10-CM | POA: Insufficient documentation

## 2018-07-27 DIAGNOSIS — I6521 Occlusion and stenosis of right carotid artery: Secondary | ICD-10-CM | POA: Insufficient documentation

## 2018-07-27 NOTE — Progress Notes (Signed)
[facility]  PATIENT: Katelyn Powers BEGIN DOB: 11-04-1931  REFERRING DOCTOR OR PCP:  Dr. Vanetta Shawl SOURCE: patient, notes from PCP and ED,  Imaging and lab reports, MRi and CT images on PACS personally reviewed.    _________________________________   HISTORICAL  CHIEF COMPLAINT:  Chief Complaint  Patient presents with  . Memory Loss    Here with dtr. for eval of memory, gait/balance disturbance. Sts. trouble with balance for about 9 mos. Fell in May, walking down dimly lit auditorium steps that had no handrail. Broke several ribs.  May  have hit heat on seat cushion. No LOC.  Per dtr, pt; has had difficulty with memory for more than a yr., worse in the lsat several mos. and they wonder if her BP meds contribute to memory problems.  Pt. also sts. she has noted her voice is hoarse, more so in the am and gets better as the day goes on./f  . Gait Disturbance    HISTORY OF PRESENT ILLNESS:  I had the pleasure seeing your patient, Katelyn Powers, at [facility] for a neurologic consultation regarding her memory loss and difficulties with gait.  She is an 84 year old woman who has had some memory difficulty or a year.   Her family notes more difficulty than she does.    She states she has not lost many items though her daughter has noted issues with that.     She is driving and does not have any issues.     She states she does not leave items on but her daughter notes the stove has been left on multiple times.   She is good about locking up at night.   She reads some daily.   She gets together with others at church functions.   Her daughter notes she has more trouble coming up with words and sometimes gets family members (grandchildren) mixed up.   There is mild confusion.    She worsened after her injury in May.    She fluctuates some.       She and daughter deny depression.  She is not apathetic.    She has normal B12 and TSH.  Sleep is poor.  She gets 4-5 hours sleep  many nights.    She has sleep onset > sleep maintenance insomnia but wakes up around 3 am most nights and uses the bathroom.  She has only 1 x nocturia.      She snores but no snorts, gasps or pauses.    She falls asleep with reading.  She also has difficulty with gait and balance for the past 9 months.  She had a bad fall in May and broke 4 ribs on the right.   She was in a theater (lights dimmed) and she missed a step   Montreal Cognitive Assessment  07/27/2018  Visuospatial/ Executive (0/5) 3  Naming (0/3) 2  Attention: Read list of digits (0/2) 1  Attention: Read list of letters (0/1) 1  Attention: Serial 7 subtraction starting at 100 (0/3) 3  Language: Repeat phrase (0/2) 1  Language : Fluency (0/1) 1  Abstraction (0/2) 2  Delayed Recall (0/5) 0  Orientation (0/6) 5  Total 19  Adjusted Score (based on education) 20    EPWORTH SLEEPINESS SCALE  On a scale of 0 - 3 what is the chance of dozing:  Sitting and Reading:   3 Watching TV:    3 Sitting inactive in a public place: 1 Passenger in  car for one hour: 3 Lying down to rest in the afternoon: 2 Sitting and talking to someone: 0 Sitting quietly after lunch:  2  In a car, stopped in traffic:  0  Total (out of 24):   14/24   Moderate   I personally reviewed the CT scan dated 06/13/2018 and MRI of the brain dated 12/31/2017.  The MRI of the brain shows moderate generalized cortical atrophy and chronic microvascular ischemic changes.  There were no acute findings.  The 7th/8th nerve complexes appeared normal.  The CT scan confirmed the MRI findings of a few months earlier.    Vitamin B12 and TSH were normal 06/13/2018.  Glucose was moderately elevated though sample was probably not fasting.  Bilirubin was slightly elevated.  The carotid Doppler study 07/22/2018 showed 60 to 79% stenosis on the right and no significant stenosis on the left.  REVIEW OF SYSTEMS: Constitutional: No fevers, chills, sweats, or change in appetite Eyes: No  visual changes, double vision, eye pain Ear, nose and throat: No hearing loss, ear pain, nasal congestion, sore throat Cardiovascular: No chest pain, palpitations Respiratory: No shortness of breath at rest or with exertion.   No wheezes GastrointestinaI: No nausea, vomiting, diarrhea, abdominal pain, fecal incontinence Genitourinary: No dysuria, urinary retention or frequency.  No nocturia. Musculoskeletal: No neck pain, back pain Integumentary: No rash, pruritus, skin lesions Neurological: as above Psychiatric: No depression at this time.  No anxiety. Endocrine: No palpitations, diaphoresis, change in appetite, change in weigh or increased thirst Hematologic/Lymphatic: No anemia, purpura, petechiae. Allergic/Immunologic: No itchy/runny eyes, nasal congestion, recent allergic reactions, rashes  ALLERGIES: No Known Allergies  HOME MEDICATIONS:  Current Outpatient Medications:  .  amLODipine (NORVASC) 10 MG tablet, Take 10 mg by mouth daily., Disp: , Rfl:  .  aspirin 81 MG tablet, Take 81 mg by mouth daily., Disp: , Rfl:  .  atorvastatin (LIPITOR) 20 MG tablet, Take 20 mg by mouth daily., Disp: , Rfl:  .  Cholecalciferol (VITAMIN D3) 1000 units CAPS, Take by mouth., Disp: , Rfl:  .  ibuprofen (ADVIL,MOTRIN) 200 MG tablet, Take 200 mg by mouth every 6 (six) hours as needed. , Disp: , Rfl:  .  losartan (COZAAR) 100 MG tablet, Take 100 mg by mouth daily. , Disp: , Rfl:   PAST MEDICAL HISTORY: Past Medical History:  Diagnosis Date  . Cataract   . Gout   . Hyperlipidemia   . Hypertension     PAST SURGICAL HISTORY: Past Surgical History:  Procedure Laterality Date  . MELANOMA EXCISION Right 1972   level 2  . 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 Colon cancer Sister 43  . Colon polyps Brother     SOCIAL HISTORY:  Social History   Socioeconomic History  . Marital  status: Widowed    Spouse name: Not on file  . Number of children: Not on file  . Years of education: Not on file  . Highest education level: Not on file  Occupational History  . Not on file  Social Needs  . Financial resource strain: Not on file  . Food insecurity:    Worry: Not on file    Inability: Not on file  . Transportation needs:    Medical: Not on file    Non-medical: Not on file  Tobacco Use  . Smoking status: Former Research scientist (life sciences)  . Smokeless  tobacco: Never Used  Substance and Sexual Activity  . Alcohol use: No    Alcohol/week: 0.0 standard drinks  . Drug use: No  . Sexual activity: Not on file  Lifestyle  . Physical activity:    Days per week: Not on file    Minutes per session: Not on file  . Stress: Not on file  Relationships  . Social connections:    Talks on phone: Not on file    Gets together: Not on file    Attends religious service: Not on file    Active member of club or organization: Not on file    Attends meetings of clubs or organizations: Not on file    Relationship status: Not on file  . Intimate partner violence:    Fear of current or ex partner: Not on file    Emotionally abused: Not on file    Physically abused: Not on file    Forced sexual activity: Not on file  Other Topics Concern  . Not on file  Social History Narrative  . Not on file     PHYSICAL EXAM  Vitals:   07/27/18 0901  BP: (!) 145/77  Pulse: 76  Weight: 160 lb (72.6 kg)  Height: 5\' 2"  (1.575 m)  HC: 20" (50.8 cm)    Body mass index is 29.26 kg/m.   General: The patient is well-developed and well-nourished and in no acute distress  Eyes:  Funduscopic exam shows normal optic discs and retinal vessels.  Neck: The neck is supple, no carotid bruits are noted.  The neck is nontender.  Cardiovascular: The heart has a regular rate and rhythm with a normal S1 and S2. There were no murmurs, gallops or rubs. Lungs are clear to auscultation.  Skin: Extremities are without  rash or edema.  Musculoskeletal:  Back is nontender  Neurologic Exam  Mental status: The patient is alert and oriented x 3 at the time of the examination. The patient has apparent normal recent and remote memory, with an apparently normal attention span and concentration ability.   Speech is normal.  Cranial nerves: Extraocular movements are full. Pupils are equal, round, and reactive to light and accomodation.  Visual fields are full.  Facial symmetry is present. There is good facial sensation to soft touch bilaterally.Facial strength is normal.  Trapezius and sternocleidomastoid strength is normal. No dysarthria is noted.  The tongue is midline, and the patient has symmetric elevation of the soft palate. No obvious hearing deficits are noted.  Motor:  Muscle bulk is normal.   Tone is normal. Strength is  5 / 5 in all 4 extremities.   Sensory: Sensory testing is intact to pinprick, soft touch and vibration sensation in all 4 extremities.  Coordination: Cerebellar testing reveals good finger-nose-finger and heel-to-shin bilaterally.  Gait and station: Station is normal.   Gait is slightly wide with a good stride.  Tandem gait is wide. . Romberg is negative.   Reflexes: Deep tendon reflexes are symmetric and normal bilaterally.   Plantar responses are flexor.    DIAGNOSTIC DATA (LABS, IMAGING, TESTING) - I reviewed patient records, labs, notes, testing and imaging myself where available.  Lab Results  Component Value Date   WBC 9.0 06/13/2018   HGB 13.1 06/13/2018   HCT 38.2 06/13/2018   MCV 94.7 06/13/2018   PLT 180 06/13/2018      Component Value Date/Time   NA 141 06/13/2018 1216   K 3.6 06/13/2018 1216   CL 108  06/13/2018 1216   CO2 24 06/13/2018 1216   GLUCOSE 185 (H) 06/13/2018 1216   BUN 18 06/13/2018 1216   CREATININE 0.81 06/13/2018 1216   CALCIUM 9.9 06/13/2018 1216   PROT 7.8 06/13/2018 1216   ALBUMIN 4.3 06/13/2018 1216   AST 35 06/13/2018 1216   ALT 31  06/13/2018 1216   ALKPHOS 103 06/13/2018 1216   BILITOT 1.5 (H) 06/13/2018 1216   GFRNONAA >60 06/13/2018 1216   GFRAA >60 06/13/2018 1216   No results found for: CHOL, HDL, LDLCALC, LDLDIRECT, TRIG, CHOLHDL No results found for: HGBA1C Lab Results  Component Value Date   VITAMINB12 364 06/13/2018   Lab Results  Component Value Date   TSH 2.167 06/13/2018       ASSESSMENT AND PLAN  Memory loss  Insomnia, unspecified type  Abnormal brain MRI  Carotid stenosis, right  Hyperlipidemia, unspecified hyperlipidemia type  In summary, Katelyn Powers is an 84 year old woman with a 1 year history of memory loss and 85-month history of gait disorder.  Today, she scored 20/30 on the [facility] cognitive assessment placing her in a mild dementia range.  However, her pattern was not completely typical.  She had no trouble drawing an accurate clock face with well-positioned hands and numbness.  Although she lost all 5 points for short-term memory, she was able to recall 4 out of 5 of the words with prompts.  She could possibly have an early Alzheimer's or a vascular overlap.     However, she also has poor sleep.  She would prefer to wait to start a medication like donepezil until after we see if we can help her sleep first with the possibility that that improved her memory.  She will take melatonin.  If that does not help, consider trazodone 50 mg or doxepin 10 mg.  The daughter will try to pay more attention next time she hears her mom sleeping to see if there are any pauses, snorts or gasps mixed in with the snoring.  If so, I would also recommend we do a sleep study.   The daughter asks if she is able to go off of the Lipitor.  She does have significant carotid stenosis so remaining on the medicine is probably better.  She could discuss this further with her other doctors as well.  She will return to see me in about 4 months or sooner if there are new or worsening neurologic symptoms.  Thank you for  asking to see Katelyn Powers.  Please let me know if I can be of further assistance with her or other patients in the future.   Melatonin If not better donepezil     Consider trazodone.  Richard A. Felecia Shelling, MD, [facility] 05/22/3663, 4:03 PM Certified in Neurology, Clinical Neurophysiology, Sleep Medicine, Pain Medicine and Neuroimaging  [facility] [address] [place], [place] [address] [phone]

## 2018-08-04 DIAGNOSIS — Z6829 Body mass index (BMI) 29.0-29.9, adult: Secondary | ICD-10-CM | POA: Diagnosis not present

## 2018-08-04 DIAGNOSIS — I10 Essential (primary) hypertension: Secondary | ICD-10-CM | POA: Diagnosis not present

## 2018-08-04 DIAGNOSIS — E785 Hyperlipidemia, unspecified: Secondary | ICD-10-CM | POA: Diagnosis not present

## 2018-08-04 DIAGNOSIS — I350 Nonrheumatic aortic (valve) stenosis: Secondary | ICD-10-CM | POA: Diagnosis not present

## 2018-08-04 DIAGNOSIS — Z1339 Encounter for screening examination for other mental health and behavioral disorders: Secondary | ICD-10-CM | POA: Diagnosis not present

## 2018-08-04 DIAGNOSIS — Z1331 Encounter for screening for depression: Secondary | ICD-10-CM | POA: Diagnosis not present

## 2018-08-30 NOTE — Progress Notes (Signed)
Cardiology Office Note:    Date:  08/31/2018   ID:  [name], DOB 18-Jul-1932, MRN [id number]  PCP:  Isaias Sakai, DO  Cardiologist:  Sinclair Grooms, MD   Referring MD: Lavone Orn, MD   Chief Complaint  Patient presents with  . Cardiac Valve Problem    History of Present Illness:    Katelyn Powers is a 84 y.o. female with a hx of mild aortic stenosis and right carotid artery disease .  Overall, doing well from cardiac standpoint.  No syncope, chest pain, dyspnea, or edema is noted.  Has been having difficulty with balance.  Notes decreased memory.  Past Medical History:  Diagnosis Date  . Cataract   . Gout   . Hyperlipidemia   . Hypertension     Past Surgical History:  Procedure Laterality Date  . MELANOMA EXCISION Right 1972   level 2  . 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 amLODipine (NORVASC) 10 MG tablet Take 10 mg by mouth daily.  Marland Kitchen aspirin 81 MG tablet Take 81 mg by mouth daily.  . Cholecalciferol (VITAMIN D3) 1000 units CAPS Take by mouth.  Marland Kitchen ibuprofen (ADVIL,MOTRIN) 200 MG tablet Take 200 mg by mouth every 6 (six) hours as needed.   Marland Kitchen losartan (COZAAR) 100 MG tablet Take 100 mg by mouth daily.      Allergies:   Patient has no known allergies.   Social History   Socioeconomic History  . Marital status: Widowed    Spouse name: Not on file  . Number of children: Not on file  . Years of education: Not on file  . Highest education level: Not on file  Occupational History  . Not on file  Social Needs  . Financial resource strain: Not on file  . Food insecurity:    Worry: Not on file    Inability: Not on file  . Transportation needs:    Medical: Not on file    Non-medical: Not on file  Tobacco Use  . Smoking status: Former Research scientist (life sciences)  . Smokeless tobacco: Never Used  Substance and Sexual Activity  . Alcohol use: No    Alcohol/week: 0.0  standard drinks  . Drug use: No  . Sexual activity: Not on file  Lifestyle  . Physical activity:    Days per week: Not on file    Minutes per session: Not on file  . Stress: Not on file  Relationships  . Social connections:    Talks on phone: Not on file    Gets together: Not on file    Attends religious service: Not on file    Active member of club or organization: Not on file    Attends meetings of clubs or organizations: Not on file    Relationship status: Not on file  Other Topics Concern  . Not on file  Social History Narrative  . Not on file     Family History: The patient's family history includes Colon cancer (age of onset: 68) in her sister; Colon polyps in her brother.  ROS:   Please see the history of present illness.    Memory and balance issues.  Has had a recent fall with rib fractures.  Episode was not syncope related but due to poor lighting on uneven stairs with carpet elevation.  All other systems reviewed and are negative.  EKGs/Labs/Other Studies Reviewed:  The following studies were reviewed today: Echocardiogram 2019: Study Conclusions  - Left ventricle: The cavity size was normal. Wall thickness was   increased in a pattern of mild LVH. There was mild focal basal   hypertrophy of the septum. Systolic function was normal. The   estimated ejection fraction was in the range of 55% to 60%. Wall   motion was normal; there were no regional wall motion   abnormalities. Doppler parameters are consistent with abnormal   left ventricular relaxation (grade 1 diastolic dysfunction). - Aortic valve: Sclerosis without significant stenosis. There was   very mild stenosis, peak velocity less than 2 m/s. There was   trivial regurgitation. - Mitral valve: There was mild regurgitation. - Left atrium: The atrium was mildly dilated. - Right ventricle: Systolic function was normal. - Atrial septum: No defect or patent foramen ovale was identified. - Tricuspid  valve: There was trivial regurgitation. - Pulmonic valve: There was mild regurgitation.  Impressions:  - Normal LV EF. Aortic valve is calcified but visibly opens on   parasternal long and short axis views.  EKG:  EKG is not ordered today.  The ekg ordered on 8/1 17/19 is unremarkable.  Normal sinus rhythm, normal axis, no ST-T wave change.  Low voltage.  Recent Labs: 06/13/2018: ALT 31; BUN 18; Creatinine, Ser 0.81; Hemoglobin 13.1; Platelets 180; Potassium 3.6; Sodium 141; TSH 2.167  Recent Lipid Panel No results found for: CHOL, TRIG, HDL, CHOLHDL, VLDL, LDLCALC, LDLDIRECT  Physical Exam:    VS:  BP 138/76   Pulse 80   Ht 5\' 2"  (1.575 m)   Wt 160 lb 6.4 oz (72.8 kg)   SpO2 93%   BMI 29.34 kg/m     Wt Readings from Last 3 Encounters:  08/31/18 160 lb 6.4 oz (72.8 kg)  07/27/18 160 lb (72.6 kg)  07/22/18 160 lb (72.6 kg)     GEN: No Well nourished, well developed in no acute distress HEENT: Normal NECK: No JVD. LYMPHATICS: No lymphadenopathy CARDIAC: RRR, scratchy 2/6 right upper sternal systolic murmur, no gallop, no edema. VASCULAR: 2+ bilateral radial and carotid pulses.  Faint transmitted bilateral carotid bruits. RESPIRATORY:  Clear to auscultation without rales, wheezing or rhonchi  ABDOMEN: Soft, non-tender, non-distended, No pulsatile mass, MUSCULOSKELETAL: No deformity  SKIN: Warm and dry NEUROLOGIC:  Alert and oriented x 3 PSYCHIATRIC:  Normal affect   ASSESSMENT:    1. Aortic valve stenosis, etiology of cardiac valve disease unspecified   2. Bilateral carotid artery stenosis   3. Memory loss   4. Hyperlipidemia LDL goal <100    PLAN:    In order of problems listed above:  1. Mild aortic stenosis.  Clinical follow-up in 1 year.  No change in current therapy. 2. Secondary risk prevention including lipid lowering, and tight blood pressure control 140/80 mmHg or less.  Agree with current blood pressure regimen. 3. Encouraged aerobic activity and  cognitive stimulation. 4. Will be followed by primary care.  Goal should be to keep LDL less than 100.  Clinical follow-up with me in 1 year for auscultation.  No change in current medical regimen.  Target LDL less than 100 and blood pressure ideally 130/80 mmHg.   Medication Adjustments/Labs and Tests Ordered: Current medicines are reviewed at length with the patient today.  Concerns regarding medicines are outlined above.  No orders of the defined types were placed in this encounter.  No orders of the defined types were placed in this encounter.   Patient  Instructions  Medication Instructions:  Your physician recommends that you continue on your current medications as directed. Please refer to the Current Medication list given to you today.  If you need a refill on your cardiac medications before your next appointment, please call your pharmacy.   Lab work: None ordered If you have labs (blood work) drawn today and your tests are completely normal, you will receive your results only by: Marland Kitchen MyChart Message (if you have MyChart) OR . A paper copy in the mail If you have any lab test that is abnormal or we need to change your treatment, we will call you to review the results.  Testing/Procedures: None ordered  Follow-Up: At [facility], you and your health needs are our priority.  As part of our continuing mission to provide you with exceptional heart care, we have created designated Provider Care Teams.  These Care Teams include your primary Cardiologist (physician) and Advanced Practice Providers (APPs -  Physician Assistants and Nurse Practitioners) who all work together to provide you with the care you need, when you need it. . You will need a follow up appointment in 1 year.  Please call our office 2 months in advance to schedule this appointment.  You may see Sinclair Grooms, MD or one of the following Advanced Practice Providers on your designated Care Team:   . Truitt Merle, NP . Cecilie Kicks, NP . Kathyrn Drown, NP   Any Other Special Instructions Will Be Listed Below (If Applicable).     Signed, Sinclair Grooms, MD  08/31/2018 10:15 AM    [place]

## 2018-08-31 ENCOUNTER — Ambulatory Visit: Payer: Medicare HMO | Admitting: Interventional Cardiology

## 2018-08-31 ENCOUNTER — Encounter: Payer: Self-pay | Admitting: Interventional Cardiology

## 2018-08-31 VITALS — BP 138/76 | HR 80 | Ht 62.0 in | Wt 160.4 lb

## 2018-08-31 DIAGNOSIS — I6523 Occlusion and stenosis of bilateral carotid arteries: Secondary | ICD-10-CM

## 2018-08-31 DIAGNOSIS — R413 Other amnesia: Secondary | ICD-10-CM

## 2018-08-31 DIAGNOSIS — I350 Nonrheumatic aortic (valve) stenosis: Secondary | ICD-10-CM | POA: Diagnosis not present

## 2018-08-31 DIAGNOSIS — E785 Hyperlipidemia, unspecified: Secondary | ICD-10-CM

## 2018-08-31 NOTE — Patient Instructions (Signed)
Medication Instructions:  Your physician recommends that you continue on your current medications as directed. Please refer to the Current Medication list given to you today.  If you need a refill on your cardiac medications before your next appointment, please call your pharmacy.   Lab work: None ordered If you have labs (blood work) drawn today and your tests are completely normal, you will receive your results only by: . MyChart Message (if you have MyChart) OR . A paper copy in the mail If you have any lab test that is abnormal or we need to change your treatment, we will call you to review the results.  Testing/Procedures: None ordered  Follow-Up: At [facility], you and your health needs are our priority.  As part of our continuing mission to provide you with exceptional heart care, we have created designated Provider Care Teams.  These Care Teams include your primary Cardiologist (physician) and Advanced Practice Providers (APPs -  Physician Assistants and Nurse Practitioners) who all work together to provide you with the care you need, when you need it. . You will need a follow up appointment in 1 year.  Please call our office 2 months in advance to schedule this appointment.  You may see Henry W Smith III, MD or one of the following Advanced Practice Providers on your designated Care Team:   . Lori Gerhardt, NP . Laura Ingold, NP . Jill McDaniel, NP   Any Other Special Instructions Will Be Listed Below (If Applicable).  

## 2018-11-27 DIAGNOSIS — I10 Essential (primary) hypertension: Secondary | ICD-10-CM | POA: Diagnosis not present

## 2018-11-27 DIAGNOSIS — R413 Other amnesia: Secondary | ICD-10-CM | POA: Diagnosis not present

## 2018-11-27 DIAGNOSIS — I350 Nonrheumatic aortic (valve) stenosis: Secondary | ICD-10-CM | POA: Diagnosis not present

## 2018-11-27 DIAGNOSIS — E785 Hyperlipidemia, unspecified: Secondary | ICD-10-CM | POA: Diagnosis not present

## 2018-11-27 DIAGNOSIS — Z9181 History of falling: Secondary | ICD-10-CM | POA: Diagnosis not present

## 2018-11-27 DIAGNOSIS — Z6828 Body mass index (BMI) 28.0-28.9, adult: Secondary | ICD-10-CM | POA: Diagnosis not present

## 2019-01-20 ENCOUNTER — Ambulatory Visit: Payer: Medicare HMO | Admitting: Vascular Surgery

## 2019-01-20 ENCOUNTER — Encounter (HOSPITAL_COMMUNITY): Payer: Medicare HMO

## 2019-01-26 ENCOUNTER — Ambulatory Visit: Payer: Medicare HMO | Admitting: Neurology

## 2019-10-27 ENCOUNTER — Ambulatory Visit: Payer: Medicare Other | Admitting: Neurology

## 2019-10-27 ENCOUNTER — Encounter: Payer: Self-pay | Admitting: Neurology

## 2019-10-27 ENCOUNTER — Other Ambulatory Visit: Payer: Self-pay

## 2019-10-27 VITALS — BP 125/74 | HR 77 | Temp 97.0°F | Wt 156.0 lb

## 2019-10-27 DIAGNOSIS — E559 Vitamin D deficiency, unspecified: Secondary | ICD-10-CM

## 2019-10-27 DIAGNOSIS — G4710 Hypersomnia, unspecified: Secondary | ICD-10-CM

## 2019-10-27 DIAGNOSIS — I6521 Occlusion and stenosis of right carotid artery: Secondary | ICD-10-CM | POA: Diagnosis not present

## 2019-10-27 DIAGNOSIS — I10 Essential (primary) hypertension: Secondary | ICD-10-CM

## 2019-10-27 DIAGNOSIS — R413 Other amnesia: Secondary | ICD-10-CM

## 2019-10-27 MED ORDER — DONEPEZIL HCL 5 MG PO TABS: 5.0000 mg | ORAL_TABLET | Freq: Every day | ORAL | 0 refills | Status: DC

## 2019-10-27 NOTE — Progress Notes (Signed)
[facility]  PATIENT: Katelyn Powers DOB: 1932-08-15  REFERRING DOCTOR OR PCP:  Dr. Vanetta Shawl SOURCE: patient, notes from PCP and ED,  Imaging and lab reports, MRi and CT images on PACS personally reviewed.    _________________________________   HISTORICAL  CHIEF COMPLAINT:  Chief Complaint  Patient presents with  . Follow-up    Memory loss pt with daughter Lynelle Smoke and her temp is 97.0     HISTORY OF PRESENT ILLNESS:  Katelyn Powers is a 85 y.o. woman with memory difficulties and gait issues  Update 10/27/19: At her initial visit in 2019, she scored 20/30 on the [facility] but STM was 0/5 without prompt and 4/5 with prompt, implying more difficulty with retrieval than storage.    She also had poor sleep which may have played a role.    I placed her on melatonin.       Since the last visit, she has noted more difficulty with memory.   She forgets what she was saying in conversation and has difficulty recognizing people she does not know well.      She goes to bed around 10 pm and falls asleep in 30 minutes.   She sleeps about 5 hours and then has trouble falling back asleep.    She takes little 15-20 naps during the day.   She snores but no puases or snorts/gasps have been noted.       Gait is stable and not any worse.     EPWORTH SLEEPINESS SCALE  On a scale of 0 - 3 what is the chance of dozing:  Sitting and Reading:   3 Watching TV:    1 Sitting inactive in a public place: 0 Passenger in car for one hour: 3 Lying down to rest in the afternoon: 0 Sitting and talking to someone: 0 Sitting quietly after lunch:  3 In a car, stopped in traffic:  0  Total (out of 24):  10/24 (mild excessive sleepiness)  Montreal Cognitive Assessment  10/27/2019 10/27/2019 07/27/2018  Visuospatial/ Executive (0/5) 3 3 3   Naming (0/3) 2 2 2   Attention: Read list of digits (0/2) 2 2 1   Attention: Read list of letters (0/1) 1 1 1   Attention: Serial 7 subtraction starting at 100 (0/3) 1  1 3   Language: Repeat phrase (0/2) 2 2 1   Language : Fluency (0/1) 1 1 1   Abstraction (0/2) 0 0 2  Delayed Recall (0/5) 0 0 0  Orientation (0/6) 5 5 5   Total 17 17 19   Adjusted Score (based on education) 18 - 20     From initial consult 07/27/2018 She is an 85 year old woman who has had some memory difficulty or a year.   Her family notes more difficulty than she does.    She states she has not lost many items though her daughter has noted issues with that.     She is driving and does not have any issues.     She states she does not leave items on but her daughter notes the stove has been left on multiple times.   She is good about locking up at night.   She reads some daily.   She gets together with others at church functions.   Her daughter notes she has more trouble coming up with words and sometimes gets family members (grandchildren) mixed up.   There is mild confusion.    She worsened after her injury in May.    She fluctuates some.  She and daughter deny depression.  She is not apathetic.    She has normal B12 and TSH.  Sleep is poor.  She gets 4-5 hours sleep many nights.    She has sleep onset > sleep maintenance insomnia but wakes up around 3 am most nights and uses the bathroom.  She has only 1 x nocturia.      She snores but no snorts, gasps or pauses.    She falls asleep with reading.  She also has difficulty with gait and balance for the past 9 months.  She had a bad fall in May and broke 4 ribs on the right.   She was in a theater (lights dimmed) and she missed a step   Montreal Cognitive Assessment  10/27/2019 10/27/2019 07/27/2018  Visuospatial/ Executive (0/5) 3 3 3   Naming (0/3) 2 2 2   Attention: Read list of digits (0/2) 2 2 1   Attention: Read list of letters (0/1) 1 1 1   Attention: Serial 7 subtraction starting at 100 (0/3) 1 1 3   Language: Repeat phrase (0/2) 2 2 1   Language : Fluency (0/1) 1 1 1   Abstraction (0/2) 0 0 2  Delayed Recall (0/5) 0 0 0  Orientation  (0/6) 5 5 5   Total 17 17 19   Adjusted Score (based on education) 18 - 20    EPWORTH SLEEPINESS SCALE  On a scale of 0 - 3 what is the chance of dozing:  Sitting and Reading:   3 Watching TV:    3 Sitting inactive in a public place: 1 Passenger in car for one hour: 3 Lying down to rest in the afternoon: 2 Sitting and talking to someone: 0 Sitting quietly after lunch:  2  In a car, stopped in traffic:  0  Total (out of 24):   14/24   Moderate   I personally reviewed the CT scan dated 06/13/2018 and MRI of the brain dated 12/31/2017.  The MRI of the brain shows moderate generalized cortical atrophy and chronic microvascular ischemic changes.  There were no acute findings.  The 7th/8th nerve complexes appeared normal.  The CT scan confirmed the MRI findings of a few months earlier.    Vitamin B12 and TSH were normal 06/13/2018.  Glucose was moderately elevated though sample was probably not fasting.  Bilirubin was slightly elevated.  The carotid Doppler study 07/22/2018 showed 60 to 79% stenosis on the right and no significant stenosis on the left.  REVIEW OF SYSTEMS: Constitutional: No fevers, chills, sweats, or change in appetite Eyes: No visual changes, double vision, eye pain Ear, nose and throat: No hearing loss, ear pain, nasal congestion, sore throat Cardiovascular: No chest pain, palpitations Respiratory: No shortness of breath at rest or with exertion.   No wheezes GastrointestinaI: No nausea, vomiting, diarrhea, abdominal pain, fecal incontinence Genitourinary: No dysuria, urinary retention or frequency.  No nocturia. Musculoskeletal: No neck pain, back pain Integumentary: No rash, pruritus, skin lesions Neurological: as above Psychiatric: No depression at this time.  No anxiety. Endocrine: No palpitations, diaphoresis, change in appetite, change in weigh or increased thirst Hematologic/Lymphatic: No anemia, purpura, petechiae. Allergic/Immunologic: No itchy/runny eyes, nasal  congestion, recent allergic reactions, rashes  ALLERGIES: No Known Allergies  HOME MEDICATIONS:  Current Outpatient Medications:  .  amLODipine (NORVASC) 10 MG tablet, Take 10 mg by mouth daily., Disp: , Rfl:  .  aspirin 81 MG tablet, Take 81 mg by mouth daily., Disp: , Rfl:  .  Cholecalciferol (VITAMIN D3) 1000 units  CAPS, Take by mouth., Disp: , Rfl:  .  losartan (COZAAR) 100 MG tablet, Take 100 mg by mouth daily. , Disp: , Rfl:  .  Multiple Vitamins-Minerals (MULTIVITAMIN ADULT PO), Take by mouth., Disp: , Rfl:  .  donepezil (ARICEPT) 5 MG tablet, Take 1 tablet (5 mg total) by mouth at bedtime., Disp: 30 tablet, Rfl: 0  PAST MEDICAL HISTORY: Past Medical History:  Diagnosis Date  . Cataract   . Gout   . Hyperlipidemia   . Hypertension   . Memory loss     PAST SURGICAL HISTORY: Past Surgical History:  Procedure Laterality Date  . MELANOMA EXCISION Right 1972   level 2  . 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 Colon cancer Sister 25  . Colon polyps Brother     SOCIAL HISTORY:  Social History   Socioeconomic History  . Marital status: Widowed    Spouse name: Not on file  . Number of children: Not on file  . Years of education: Not on file  . Highest education level: Not on file  Occupational History  . Not on file  Tobacco Use  . Smoking status: Former Research scientist (life sciences)  . Smokeless tobacco: Never Used  Substance and Sexual Activity  . Alcohol use: No    Alcohol/week: 0.0 standard drinks  . Drug use: No  . Sexual activity: Not on file  Other Topics Concern  . Not on file  Social History Narrative  . Not on file   Social Determinants of Health   Financial Resource Strain:   . Difficulty of Paying Living Expenses: Not on file  Food Insecurity:   . Worried About Charity fundraiser in the Last Year: Not on file  . Ran Out of Food in the Last Year: Not on file    Transportation Needs:   . Lack of Transportation (Medical): Not on file  . Lack of Transportation (Non-Medical): Not on file  Physical Activity:   . Days of Exercise per Week: Not on file  . Minutes of Exercise per Session: Not on file  Stress:   . Feeling of Stress : Not on file  Social Connections:   . Frequency of Communication with Friends and Family: Not on file  . Frequency of Social Gatherings with Friends and Family: Not on file  . Attends Religious Services: Not on file  . Active Member of Clubs or Organizations: Not on file  . Attends Archivist Meetings: Not on file  . Marital Status: Not on file  Intimate Partner Violence:   . Fear of Current or Ex-Partner: Not on file  . Emotionally Abused: Not on file  . Physically Abused: Not on file  . Sexually Abused: Not on file     PHYSICAL EXAM  Vitals:   10/27/19 0827  BP: 125/74  Pulse: 77  Temp: (!) 97 F (36.1 C)  Weight: 156 lb (70.8 kg)    Body mass index is 28.53 kg/m.   General: The patient is well-developed and well-nourished and in no acute distress  Neurologic Exam  Mental status: The patient is alert and oriented x 3 at the time of the examination.  She has reduced short-term memory and executive function.  She scored 18/30 on the [facility] cognitive assessment.   speech is normal.  Cranial nerves: Extraocular movements are full.  Facial symmetry is present. There is  good facial sensation to soft touch bilaterally.Facial strength is normal.  Trapezius and sternocleidomastoid strength is normal. No dysarthria is noted.  She has reduced hearing bilaterally..  Motor:  Muscle bulk is normal.   Tone is normal. Strength is  5 / 5 in all 4 extremities.   Sensory: Intact sensation to touch, temperature and vibration..  Coordination: Cerebellar testing reveals good finger-nose-finger and heel-to-shin bilaterally.  Gait and station: Station is normal.   Gait is slightly wide with a good stride.   Tandem gait is wide.. Romberg is negative.   Reflexes: Deep tendon reflexes are symmetric and normal bilaterally.   Plantar responses are flexor.    DIAGNOSTIC DATA (LABS, IMAGING, TESTING) - I reviewed patient records, labs, notes, testing and imaging myself where available.  Lab Results  Component Value Date   WBC 9.0 06/13/2018   HGB 13.1 06/13/2018   HCT 38.2 06/13/2018   MCV 94.7 06/13/2018   PLT 180 06/13/2018      Component Value Date/Time   NA 141 06/13/2018 1216   K 3.6 06/13/2018 1216   CL 108 06/13/2018 1216   CO2 24 06/13/2018 1216   GLUCOSE 185 (H) 06/13/2018 1216   BUN 18 06/13/2018 1216   CREATININE 0.81 06/13/2018 1216   CALCIUM 9.9 06/13/2018 1216   PROT 7.8 06/13/2018 1216   ALBUMIN 4.3 06/13/2018 1216   AST 35 06/13/2018 1216   ALT 31 06/13/2018 1216   ALKPHOS 103 06/13/2018 1216   BILITOT 1.5 (H) 06/13/2018 1216   GFRNONAA >60 06/13/2018 1216   GFRAA >60 06/13/2018 1216   No results found for: CHOL, HDL, LDLCALC, LDLDIRECT, TRIG, CHOLHDL No results found for: HGBA1C Lab Results  Component Value Date   VITAMINB12 364 06/13/2018   Lab Results  Component Value Date   TSH 2.167 06/13/2018       ASSESSMENT AND PLAN  Memory loss - Plan: VITAMIN D 25 Hydroxy (Vit-D Deficiency, Fractures), TSH  Vitamin D deficiency - Plan: VITAMIN D 25 Hydroxy (Vit-D Deficiency, Fractures)  Benign essential HTN  Carotid stenosis, right  Excessive sleepiness - Plan: TSH  1.   She has mild dementia.  This could be due to Alzheimer's disease or vascular etiology.  Regardless, she might benefit from donepezil and I will have her start 5 mg nightly.  I asked the daughter to call in a month or so and we can increase the dose to 10 mg if well tolerated. 2.    Restart melatonin 3 or 5 mg nightly to help with sleep.  She does have some snoring with daytime sleepiness and we discussed getting a sleep study if sleep issues worsen. 3.    Try to stay active and exercise  as tolerated. 4.    Return in 6 months or sooner for new or worsening neurologic symptoms.  45-minute face-to-face interaction with greater than one half the time counseling and coordinating care regarding her cognitive issues. Gianelle Mccaul A. Felecia Shelling, MD, [facility] [id number], [phone] PM Certified in Neurology, Clinical Neurophysiology, Sleep Medicine, Pain Medicine and Neuroimaging  [facility] [address], [place], [place] [address] [phone]

## 2019-10-28 ENCOUNTER — Other Ambulatory Visit: Payer: Self-pay | Admitting: Neurology

## 2019-10-28 LAB — VITAMIN D 25 HYDROXY (VIT D DEFICIENCY, FRACTURES): Vit D, 25-Hydroxy: 61.9 ng/mL (ref 30.0–100.0)

## 2019-10-28 LAB — TSH: TSH: 4.05 u[IU]/mL (ref 0.450–4.500)

## 2019-11-01 ENCOUNTER — Telehealth: Payer: Self-pay | Admitting: *Deleted

## 2019-11-01 NOTE — Telephone Encounter (Signed)
-----   Message from Britt Bottom, MD sent at 10/28/2019  8:57 AM EST ----- Please let the patient know that the lab work is fine.

## 2019-11-01 NOTE — Telephone Encounter (Signed)
Called and spoke with pt daughter, Lynelle Smoke. Relayed results per MD note. She verbalized understanding.

## 2019-12-27 ENCOUNTER — Other Ambulatory Visit: Payer: Self-pay | Admitting: Neurology

## 2020-02-22 ENCOUNTER — Ambulatory Visit: Payer: Medicare Other | Admitting: Neurology

## 2020-02-22 ENCOUNTER — Other Ambulatory Visit: Payer: Self-pay

## 2020-02-22 ENCOUNTER — Encounter: Payer: Self-pay | Admitting: Neurology

## 2020-02-22 VITALS — BP 128/72 | HR 69 | Temp 97.1°F | Ht 62.0 in | Wt 153.5 lb

## 2020-02-22 DIAGNOSIS — R413 Other amnesia: Secondary | ICD-10-CM

## 2020-02-22 DIAGNOSIS — G4710 Hypersomnia, unspecified: Secondary | ICD-10-CM

## 2020-02-22 DIAGNOSIS — R9089 Other abnormal findings on diagnostic imaging of central nervous system: Secondary | ICD-10-CM

## 2020-02-22 DIAGNOSIS — G301 Alzheimer's disease with late onset: Secondary | ICD-10-CM

## 2020-02-22 DIAGNOSIS — I6521 Occlusion and stenosis of right carotid artery: Secondary | ICD-10-CM | POA: Diagnosis not present

## 2020-02-22 DIAGNOSIS — F0280 Dementia in other diseases classified elsewhere without behavioral disturbance: Secondary | ICD-10-CM

## 2020-02-22 MED ORDER — DONEPEZIL HCL 10 MG PO TABS: 10.0000 mg | ORAL_TABLET | Freq: Every day | ORAL | 4 refills | Status: DC

## 2020-02-22 NOTE — Progress Notes (Signed)
[facility]  PATIENT: Katelyn Powers DOB: [birth year]  REFERRING DOCTOR OR PCP:  Dr. Vanetta Shawl SOURCE: patient, notes from PCP and ED,  Imaging and lab reports, MRi and CT images on PACS personally reviewed.    _________________________________   HISTORICAL  CHIEF COMPLAINT:  Chief Complaint  Patient presents with  . Follow-up    RM 12 with daughter, [name](temp: 97.3) Last seen 10/27/19. Here for increased confusion. Last MOCA 18/30. Her memory has worsened in the last couple months. Daughter feels she gets dehydrated easily and this makes her sx worse. They are monitoring what she eats/drink more. Has dinner with daughter most nights. She has only been on donepezil for about a month. When it was first presribed, she took in the morning and did not tolerate well. She is also having left hip pain, she doing more gardening.     HISTORY OF PRESENT ILLNESS:  Katelyn Powers is a 86 y.o. woman with memory difficulties and gait issues   Update 02/22/2020: Her daughter is noting more issues with her mom's STM.   This seems worse if she is not eating as well or is dehydrated.    In general, she is slowly progressing.   She is fairly active doing activities outdoors and doing things with her family.     She takes donepezil with melatonin and the combination is helping her sleep better.     She often wakes up at 3 am but is getting falls asleep at 9-10 pm and gets 5-6 solid hours of sleep.  She naps multiple sort naps during the day (adds up to 2 hours/day)  She snores but has no gasping/OSA.   She has lost some weight and snores less.      She notes more pain in her left hip.   She is active and is doing more gardening with the improved weather.     Update 10/27/19: At her initial visit in 2019, she scored 20/30 on the [facility] but STM was 0/5 without prompt and 4/5 with prompt, implying more difficulty with retrieval than storage.    She also had poor sleep which may have played a role.     I placed her on melatonin.       Since the last visit, she has noted more difficulty with memory.   She forgets what she was saying in conversation and has difficulty recognizing people she does not know well.      She goes to bed around 10 pm and falls asleep in 30 minutes.   She sleeps about 5 hours and then has trouble falling back asleep.    She takes little 15-20 naps during the day.   She snores but no puases or snorts/gasps have been noted.      Gait is stable and not any worse.     EPWORTH SLEEPINESS SCALE  On a scale of 0 - 3 what is the chance of dozing:  Sitting and Reading:   3 Watching TV:    1 Sitting inactive in a public place: 0 Passenger in car for one hour: 3 Lying down to rest in the afternoon: 0 Sitting and talking to someone: 0 Sitting quietly after lunch:  3 In a car, stopped in traffic:  0  Total (out of 24):  10/24 (mild excessive sleepiness)  Montreal Cognitive Assessment  10/27/2019 10/27/2019 07/27/2018  Visuospatial/ Executive (0/5) 3 3 3   Naming (0/3) 2 2 2   Attention: Read list of digits (0/2)  2 2 1   Attention: Read list of letters (0/1) 1 1 1   Attention: Serial 7 subtraction starting at 100 (0/3) 1 1 3   Language: Repeat phrase (0/2) 2 2 1   Language : Fluency (0/1) 1 1 1   Abstraction (0/2) 0 0 2  Delayed Recall (0/5) 0 0 0  Orientation (0/6) 5 5 5   Total 17 17 19   Adjusted Score (based on education) 18 - 20     From initial consult 07/27/2018 She is an 86 year old woman who has had some memory difficulty or a year.   Her family notes more difficulty than she does.    She states she has not lost many items though her daughter has noted issues with that.     She is driving and does not have any issues.     She states she does not leave items on but her daughter notes the stove has been left on multiple times.   She is good about locking up at night.   She reads some daily.   She gets together with others at church functions.   Her daughter notes she  has more trouble coming up with words and sometimes gets family members (grandchildren) mixed up.   There is mild confusion.    She worsened after her injury in May.    She fluctuates some.       She and daughter deny depression.  She is not apathetic.    She has normal B12 and TSH.  Sleep is poor.  She gets 4-5 hours sleep many nights.    She has sleep onset > sleep maintenance insomnia but wakes up around 3 am most nights and uses the bathroom.  She has only 1 x nocturia.      She snores but no snorts, gasps or pauses.    She falls asleep with reading.  She also has difficulty with gait and balance for the past 9 months.  She had a bad fall in May and broke 4 ribs on the right.   She was in a theater (lights dimmed) and she missed a step   Montreal Cognitive Assessment  10/27/2019 10/27/2019 07/27/2018  Visuospatial/ Executive (0/5) 3 3 3   Naming (0/3) 2 2 2   Attention: Read list of digits (0/2) 2 2 1   Attention: Read list of letters (0/1) 1 1 1   Attention: Serial 7 subtraction starting at 100 (0/3) 1 1 3   Language: Repeat phrase (0/2) 2 2 1   Language : Fluency (0/1) 1 1 1   Abstraction (0/2) 0 0 2  Delayed Recall (0/5) 0 0 0  Orientation (0/6) 5 5 5   Total 17 17 19   Adjusted Score (based on education) 18 - 20    EPWORTH SLEEPINESS SCALE  On a scale of 0 - 3 what is the chance of dozing:  Sitting and Reading:   3 Watching TV:    3 Sitting inactive in a public place: 1 Passenger in car for one hour: 3 Lying down to rest in the afternoon: 2 Sitting and talking to someone: 0 Sitting quietly after lunch:  2  In a car, stopped in traffic:  0  Total (out of 24):   14/24   Moderate   I personally reviewed the CT scan dated 06/13/2018 and MRI of the brain dated 12/31/2017.  The MRI of the brain shows moderate generalized cortical atrophy and chronic microvascular ischemic changes.  There were no acute findings.  The 7th/8th nerve complexes appeared normal.  The CT scan confirmed the MRI  findings of a few months earlier.    Vitamin B12 and TSH were normal 06/13/2018.  Glucose was moderately elevated though sample was probably not fasting.  Bilirubin was slightly elevated.  The carotid Doppler study 07/22/2018 showed 60 to 79% stenosis on the right and no significant stenosis on the left.  REVIEW OF SYSTEMS: Constitutional: No fevers, chills, sweats, or change in appetite Eyes: No visual changes, double vision, eye pain Ear, nose and throat: No hearing loss, ear pain, nasal congestion, sore throat Cardiovascular: No chest pain, palpitations Respiratory: No shortness of breath at rest or with exertion.   No wheezes GastrointestinaI: No nausea, vomiting, diarrhea, abdominal pain, fecal incontinence Genitourinary: No dysuria, urinary retention or frequency.  No nocturia. Musculoskeletal: No neck pain, back pain Integumentary: No rash, pruritus, skin lesions Neurological: as above Psychiatric: No depression at this time.  No anxiety. Endocrine: No palpitations, diaphoresis, change in appetite, change in weigh or increased thirst Hematologic/Lymphatic: No anemia, purpura, petechiae. Allergic/Immunologic: No itchy/runny eyes, nasal congestion, recent allergic reactions, rashes  ALLERGIES: No Known Allergies  HOME MEDICATIONS:  Current Outpatient Medications:  .  amLODipine (NORVASC) 10 MG tablet, Take 10 mg by mouth daily., Disp: , Rfl:  .  aspirin 81 MG tablet, Take 81 mg by mouth daily., Disp: , Rfl:  .  Cholecalciferol (VITAMIN D3) 1000 units CAPS, Take by mouth., Disp: , Rfl:  .  Docosahexaenoic Acid (DHA COMPLETE PO), Take by mouth., Disp: , Rfl:  .  donepezil (ARICEPT) 10 MG tablet, Take 1 tablet (10 mg total) by mouth at bedtime., Disp: 90 tablet, Rfl: 4 .  losartan (COZAAR) 100 MG tablet, Take 100 mg by mouth daily. , Disp: , Rfl:  .  Melatonin 10 MG TABS, Take 1 tablet by mouth at bedtime., Disp: , Rfl:   PAST MEDICAL HISTORY: Past Medical History:  Diagnosis  Date  . Cataract   . Gout   . Hyperlipidemia   . Hypertension   . Memory loss     PAST SURGICAL HISTORY: Past Surgical History:  Procedure Laterality Date  . MELANOMA EXCISION Right 1972   level 2  . 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 Colon cancer Sister 37  . Colon polyps Brother     SOCIAL HISTORY:  Social History   Socioeconomic History  . Marital status: Widowed    Spouse name: Not on file  . Number of children: Not on file  . Years of education: Not on file  . Highest education level: Not on file  Occupational History  . Not on file  Tobacco Use  . Smoking status: Former Research scientist (life sciences)  . Smokeless tobacco: Never Used  Substance and Sexual Activity  . Alcohol use: No    Alcohol/week: 0.0 standard drinks  . Drug use: No  . Sexual activity: Not on file  Other Topics Concern  . Not on file  Social History Narrative  . Not on file   Social Determinants of Health   Financial Resource Strain:   . Difficulty of Paying Living Expenses:   Food Insecurity:   . Worried About Charity fundraiser in the Last Year:   . Arboriculturist in the Last Year:   Transportation Needs:   . Film/video editor (Medical):   Marland Kitchen Lack of Transportation (Non-Medical):   Physical Activity:   .  Days of Exercise per Week:   . Minutes of Exercise per Session:   Stress:   . Feeling of Stress :   Social Connections:   . Frequency of Communication with Friends and Family:   . Frequency of Social Gatherings with Friends and Family:   . Attends Religious Services:   . Active Member of Clubs or Organizations:   . Attends Archivist Meetings:   Marland Kitchen Marital Status:   Intimate Partner Violence:   . Fear of Current or Ex-Partner:   . Emotionally Abused:   Marland Kitchen Physically Abused:   . Sexually Abused:      PHYSICAL EXAM  Vitals:   02/22/20 1516  BP: 128/72  Pulse: 69  Temp:  (!) 97.1 F (36.2 C)  SpO2: 96%  Weight: 153 lb 8 oz (69.6 kg)  Height: 5\' 2"  (1.575 m)    Body mass index is 28.08 kg/m.   General: The patient is well-developed and well-nourished and in no acute distress  Neurologic Exam  Mental status: The patient is alert and oriented x 3 at the time of the examination.  She has reduced short-term memory and executive function.  She scored 18/30 on the [facility] cognitive assessment.   speech is normal.  Cranial nerves: Extraocular movements are full.  Facial symmetry is present. There is good facial sensation to soft touch bilaterally.Facial strength is normal.  Trapezius and sternocleidomastoid strength is normal. No dysarthria is noted.  She has reduced hearing bilaterally..  Motor:  Muscle bulk is normal.   Tone is normal. Strength is  5 / 5 in all 4 extremities.   Sensory: Intact sensation to touch, temperature and vibration..  Coordination: Cerebellar testing reveals good finger-nose-finger and heel-to-shin bilaterally.  Gait and station: Station is normal.   Gait is slightly wide with a good stride.  Tandem gait is wide.. Romberg is negative.   Reflexes: Deep tendon reflexes are symmetric and normal bilaterally.   Plantar responses are flexor.    DIAGNOSTIC DATA (LABS, IMAGING, TESTING) - I reviewed patient records, labs, notes, testing and imaging myself where available.  Lab Results  Component Value Date   WBC 9.0 06/13/2018   HGB 13.1 06/13/2018   HCT 38.2 06/13/2018   MCV 94.7 06/13/2018   PLT 180 06/13/2018      Component Value Date/Time   NA 141 06/13/2018 1216   K 3.6 06/13/2018 1216   CL 108 06/13/2018 1216   CO2 24 06/13/2018 1216   GLUCOSE 185 (H) 06/13/2018 1216   BUN 18 06/13/2018 1216   CREATININE 0.81 06/13/2018 1216   CALCIUM 9.9 06/13/2018 1216   PROT 7.8 06/13/2018 1216   ALBUMIN 4.3 06/13/2018 1216   AST 35 06/13/2018 1216   ALT 31 06/13/2018 1216   ALKPHOS 103 06/13/2018 1216   BILITOT 1.5 (H)  06/13/2018 1216   GFRNONAA >60 06/13/2018 1216   GFRAA >60 06/13/2018 1216   No results found for: CHOL, HDL, LDLCALC, LDLDIRECT, TRIG, CHOLHDL No results found for: HGBA1C Lab Results  Component Value Date   VITAMINB12 364 06/13/2018   Lab Results  Component Value Date   TSH 4.050 10/27/2019       ASSESSMENT AND PLAN  Late onset Alzheimer's disease without behavioral disturbance (HCC)  Memory loss  Excessive sleepiness  Carotid stenosis, right  Abnormal brain MRI  1.   Dementia is most c/w late onset Alzheimer's disease.  Increase the dose of donepezil to 10 mg if well tolerated. 2.  Continue melatonin 3 or 5 mg nightly to help with sleep.   3.    Try to stay active and exercise as tolerated. 4.    Long conversation about prognosis and treatments  5.   Return in 6 months or sooner for new or worsening neurologic symptoms.  45-minute face-to-face interaction with greater than one half the time counseling and coordinating care regarding her cognitive issues. Sumayya Muha A. Felecia Shelling, MD, [facility] [id number], [id number] PM Certified in Neurology, Clinical Neurophysiology, Sleep Medicine, Pain Medicine and Neuroimaging  [facility] [address] [place], [place] [address] [phone]

## 2020-03-14 ENCOUNTER — Telehealth: Payer: Self-pay | Admitting: Neurology

## 2020-03-14 NOTE — Telephone Encounter (Signed)
[name] daughter Russell,[name](on DPR) is asking for a call from Dr Felecia Shelling to discuss pt's donepezil (ARICEPT) 10 MG tablet

## 2020-03-14 NOTE — Telephone Encounter (Signed)
Called, LVM returning call

## 2020-03-15 ENCOUNTER — Other Ambulatory Visit: Payer: Self-pay | Admitting: *Deleted

## 2020-03-15 NOTE — Telephone Encounter (Signed)
Spoke with Dr. Felecia Shelling. He recommends she stay on aricept but if they would like to stop it that is okay. They can get ginkgo biloba OTC, no need for a prescription for this.

## 2020-03-15 NOTE — Telephone Encounter (Signed)
Called and spoke with Katelyn Powers at [phone] . She asked I call her back at [phone] because she has better reception on this number. States Dr. Felecia Shelling increased Aricept 02/22/20 from 5mg  to 10mg . Since the increase, she has been more depressed, which is unusual for her. She has complained that one of her heels was hurting and has lost weight d/t not being hungry. Wanting to know if they can take her off the donepezil and if ginkgo biloba could be prescribed for her instead. She was reading online that they would need a prescription for this. They are wanting to try a more naturalistic route for her and give her a better quality of life. Advised I will discuss with MD and call back.

## 2020-03-15 NOTE — Telephone Encounter (Signed)
Called and spoke with [name]. Relayed message below. She is going to stop her Aricept. Advised no need to taper off per MD. She will pick up OTC ginkgo biloba. She will call back if she has any future questions or concerns.

## 2020-04-26 ENCOUNTER — Ambulatory Visit: Payer: Medicare Other | Admitting: Neurology

## 2020-05-10 ENCOUNTER — Emergency Department
Admission: EM | Admit: 2020-05-10 | Discharge: 2020-05-10 | Disposition: A | Discharge status: Home / Self Care | Payer: Medicare Other | Attending: Emergency Medicine | Admitting: Emergency Medicine

## 2020-05-10 ENCOUNTER — Other Ambulatory Visit: Payer: Self-pay

## 2020-05-10 ENCOUNTER — Telehealth: Payer: Self-pay | Admitting: Emergency Medicine

## 2020-05-10 DIAGNOSIS — R1110 Vomiting, unspecified: Secondary | ICD-10-CM | POA: Insufficient documentation

## 2020-05-10 DIAGNOSIS — R197 Diarrhea, unspecified: Secondary | ICD-10-CM | POA: Insufficient documentation

## 2020-05-10 DIAGNOSIS — Z5321 Procedure and treatment not carried out due to patient leaving prior to being seen by health care provider: Secondary | ICD-10-CM | POA: Insufficient documentation

## 2020-05-10 LAB — COMPREHENSIVE METABOLIC PANEL
ALT: 14 U/L (ref 0–44)
AST: 20 U/L (ref 15–41)
Albumin: 4.3 g/dL (ref 3.5–5.0)
Alkaline Phosphatase: 83 U/L (ref 38–126)
Anion gap: 13 (ref 5–15)
BUN: 26 mg/dL — ABNORMAL HIGH (ref 8–23)
CO2: 21 mmol/L — ABNORMAL LOW (ref 22–32)
Calcium: 9.8 mg/dL (ref 8.9–10.3)
Chloride: 105 mmol/L (ref 98–111)
Creatinine, Ser: 1.05 mg/dL — ABNORMAL HIGH (ref 0.44–1.00)
GFR calc Af Amer: 56 mL/min — ABNORMAL LOW (ref >60)
GFR calc non Af Amer: 48 mL/min — ABNORMAL LOW (ref >60)
Glucose, Bld: 164 mg/dL — ABNORMAL HIGH (ref 70–99)
Potassium: 3.4 mmol/L — ABNORMAL LOW (ref 3.5–5.1)
Sodium: 139 mmol/L (ref 135–145)
Total Bilirubin: 1 mg/dL (ref 0.3–1.2)
Total Protein: 8.1 g/dL (ref 6.5–8.1)

## 2020-05-10 LAB — CBC
HCT: 42.7 % (ref 36.0–46.0)
Hemoglobin: 14.1 g/dL (ref 12.0–15.0)
MCH: 30.2 pg (ref 26.0–34.0)
MCHC: 33 g/dL (ref 30.0–36.0)
MCV: 91.4 fL (ref 80.0–100.0)
Platelets: 150 10*3/uL (ref 150–400)
RBC: 4.67 MIL/uL (ref 3.87–5.11)
RDW: 13.6 % (ref 11.5–15.5)
WBC: 12.9 10*3/uL — ABNORMAL HIGH (ref 4.0–10.5)
nRBC: 0 % (ref 0.0–0.2)

## 2020-05-10 LAB — LIPASE, BLOOD: Lipase: 38 U/L (ref 11–51)

## 2020-05-10 NOTE — ED Triage Notes (Signed)
Pt arrives to ED via POV from home with c/o emesis and diarrhea since 1130pm last night (2 hrs). Pt denies abdominal pain; no chest pain. Pt reports 5-6 episodes of emesis and 3-4 episodes of diarrhea. Pt is A&O, in NAD; RR even, regular, and unlabored.

## 2020-05-10 NOTE — Telephone Encounter (Signed)
Called patient due to lwot to inquire about condition and follow up plans. Left message.   

## 2020-08-10 DIAGNOSIS — I10 Essential (primary) hypertension: Secondary | ICD-10-CM | POA: Diagnosis not present

## 2020-08-10 DIAGNOSIS — I350 Nonrheumatic aortic (valve) stenosis: Secondary | ICD-10-CM | POA: Diagnosis not present

## 2020-08-10 DIAGNOSIS — G301 Alzheimer's disease with late onset: Secondary | ICD-10-CM | POA: Diagnosis not present

## 2020-08-10 DIAGNOSIS — E785 Hyperlipidemia, unspecified: Secondary | ICD-10-CM | POA: Diagnosis not present

## 2020-08-28 DIAGNOSIS — I214 Non-ST elevation (NSTEMI) myocardial infarction: Secondary | ICD-10-CM

## 2020-08-28 HISTORY — DX: Non-ST elevation (NSTEMI) myocardial infarction: I21.4

## 2020-09-04 ENCOUNTER — Inpatient Hospital Stay (HOSPITAL_COMMUNITY)
Admission: EM | Admit: 2020-09-04 | Discharge: 2020-09-07 | DRG: 281 | Disposition: A | Discharge status: Home Health | Payer: Medicare Other | Attending: Cardiovascular Disease | Admitting: Cardiovascular Disease

## 2020-09-04 ENCOUNTER — Emergency Department (HOSPITAL_COMMUNITY): Payer: Medicare Other

## 2020-09-04 ENCOUNTER — Ambulatory Visit (HOSPITAL_COMMUNITY): Admit: 2020-09-04 | Payer: Medicare Other | Admitting: Cardiology

## 2020-09-04 ENCOUNTER — Telehealth: Payer: Self-pay | Admitting: Interventional Cardiology

## 2020-09-04 ENCOUNTER — Encounter (HOSPITAL_COMMUNITY)
Admission: EM | Disposition: A | Discharge status: Home Health | Payer: Self-pay | Source: Home / Self Care | Attending: Cardiovascular Disease

## 2020-09-04 ENCOUNTER — Other Ambulatory Visit: Payer: Self-pay

## 2020-09-04 DIAGNOSIS — I214 Non-ST elevation (NSTEMI) myocardial infarction: Secondary | ICD-10-CM | POA: Diagnosis not present

## 2020-09-04 DIAGNOSIS — Z87891 Personal history of nicotine dependence: Secondary | ICD-10-CM | POA: Diagnosis not present

## 2020-09-04 DIAGNOSIS — Z85820 Personal history of malignant melanoma of skin: Secondary | ICD-10-CM | POA: Diagnosis not present

## 2020-09-04 DIAGNOSIS — Z79899 Other long term (current) drug therapy: Secondary | ICD-10-CM | POA: Diagnosis not present

## 2020-09-04 DIAGNOSIS — F0390 Unspecified dementia without behavioral disturbance: Secondary | ICD-10-CM | POA: Diagnosis present

## 2020-09-04 DIAGNOSIS — R413 Other amnesia: Secondary | ICD-10-CM | POA: Diagnosis present

## 2020-09-04 DIAGNOSIS — E785 Hyperlipidemia, unspecified: Secondary | ICD-10-CM | POA: Diagnosis not present

## 2020-09-04 DIAGNOSIS — H269 Unspecified cataract: Secondary | ICD-10-CM | POA: Diagnosis present

## 2020-09-04 DIAGNOSIS — Z79890 Hormone replacement therapy (postmenopausal): Secondary | ICD-10-CM

## 2020-09-04 DIAGNOSIS — Z8371 Family history of colonic polyps: Secondary | ICD-10-CM | POA: Diagnosis not present

## 2020-09-04 DIAGNOSIS — R6889 Other general symptoms and signs: Secondary | ICD-10-CM | POA: Diagnosis not present

## 2020-09-04 DIAGNOSIS — I959 Hypotension, unspecified: Secondary | ICD-10-CM | POA: Diagnosis not present

## 2020-09-04 DIAGNOSIS — I499 Cardiac arrhythmia, unspecified: Secondary | ICD-10-CM | POA: Diagnosis not present

## 2020-09-04 DIAGNOSIS — I6521 Occlusion and stenosis of right carotid artery: Secondary | ICD-10-CM | POA: Diagnosis present

## 2020-09-04 DIAGNOSIS — R0789 Other chest pain: Secondary | ICD-10-CM | POA: Diagnosis not present

## 2020-09-04 DIAGNOSIS — I6523 Occlusion and stenosis of bilateral carotid arteries: Secondary | ICD-10-CM | POA: Diagnosis not present

## 2020-09-04 DIAGNOSIS — I358 Other nonrheumatic aortic valve disorders: Secondary | ICD-10-CM | POA: Diagnosis present

## 2020-09-04 DIAGNOSIS — E782 Mixed hyperlipidemia: Secondary | ICD-10-CM | POA: Diagnosis present

## 2020-09-04 DIAGNOSIS — R001 Bradycardia, unspecified: Secondary | ICD-10-CM | POA: Diagnosis not present

## 2020-09-04 DIAGNOSIS — R531 Weakness: Secondary | ICD-10-CM | POA: Diagnosis not present

## 2020-09-04 DIAGNOSIS — I10 Essential (primary) hypertension: Secondary | ICD-10-CM | POA: Diagnosis not present

## 2020-09-04 DIAGNOSIS — Z800 Family history of malignant neoplasm of digestive organs: Secondary | ICD-10-CM

## 2020-09-04 DIAGNOSIS — F05 Delirium due to known physiological condition: Secondary | ICD-10-CM | POA: Diagnosis present

## 2020-09-04 DIAGNOSIS — Z7982 Long term (current) use of aspirin: Secondary | ICD-10-CM | POA: Diagnosis not present

## 2020-09-04 DIAGNOSIS — M109 Gout, unspecified: Secondary | ICD-10-CM | POA: Diagnosis not present

## 2020-09-04 DIAGNOSIS — I351 Nonrheumatic aortic (valve) insufficiency: Secondary | ICD-10-CM | POA: Diagnosis not present

## 2020-09-04 DIAGNOSIS — R079 Chest pain, unspecified: Secondary | ICD-10-CM | POA: Diagnosis not present

## 2020-09-04 DIAGNOSIS — Z20822 Contact with and (suspected) exposure to covid-19: Secondary | ICD-10-CM | POA: Diagnosis present

## 2020-09-04 LAB — CBC
HCT: 41.8 % (ref 36.0–46.0)
Hemoglobin: 13.5 g/dL (ref 12.0–15.0)
MCH: 30.4 pg (ref 26.0–34.0)
MCHC: 32.3 g/dL (ref 30.0–36.0)
MCV: 94.1 fL (ref 80.0–100.0)
Platelets: 205 10*3/uL (ref 150–400)
RBC: 4.44 MIL/uL (ref 3.87–5.11)
RDW: 13.9 % (ref 11.5–15.5)
WBC: 9.7 10*3/uL (ref 4.0–10.5)
nRBC: 0 % (ref 0.0–0.2)

## 2020-09-04 LAB — TROPONIN I (HIGH SENSITIVITY)
Troponin I (High Sensitivity): 223 ng/L (ref <18)
Troponin I (High Sensitivity): 958 ng/L (ref <18)
Troponin I (High Sensitivity): 1460 ng/L (ref <18)
Troponin I (High Sensitivity): 1633 ng/L (ref <18)

## 2020-09-04 LAB — BASIC METABOLIC PANEL
Anion gap: 14 (ref 5–15)
BUN: 19 mg/dL (ref 8–23)
CO2: 21 mmol/L — ABNORMAL LOW (ref 22–32)
Calcium: 9.8 mg/dL (ref 8.9–10.3)
Chloride: 103 mmol/L (ref 98–111)
Creatinine, Ser: 0.94 mg/dL (ref 0.44–1.00)
GFR, Estimated: 59 mL/min — ABNORMAL LOW (ref >60)
Glucose, Bld: 137 mg/dL — ABNORMAL HIGH (ref 70–99)
Potassium: 3.7 mmol/L (ref 3.5–5.1)
Sodium: 138 mmol/L (ref 135–145)

## 2020-09-04 LAB — LIPID PANEL
Cholesterol: 234 mg/dL — ABNORMAL HIGH (ref 0–200)
HDL: 52 mg/dL (ref >40)
LDL Cholesterol: 138 mg/dL — ABNORMAL HIGH (ref 0–99)
Total CHOL/HDL Ratio: 4.5 RATIO
Triglycerides: 220 mg/dL — ABNORMAL HIGH (ref <150)
VLDL: 44 mg/dL — ABNORMAL HIGH (ref 0–40)

## 2020-09-04 LAB — PROTIME-INR
INR: 1.1 (ref 0.8–1.2)
Prothrombin Time: 13.3 seconds (ref 11.4–15.2)

## 2020-09-04 LAB — APTT: aPTT: 24 seconds (ref 24–36)

## 2020-09-04 LAB — RESPIRATORY PANEL BY RT PCR (FLU A&B, COVID)
Influenza A by PCR: NEGATIVE
Influenza B by PCR: NEGATIVE
SARS Coronavirus 2 by RT PCR: NEGATIVE

## 2020-09-04 LAB — HEMOGLOBIN A1C
Hgb A1c MFr Bld: 5.8 % — ABNORMAL HIGH (ref 4.8–5.6)
Mean Plasma Glucose: 119.76 mg/dL

## 2020-09-04 LAB — HEPARIN LEVEL (UNFRACTIONATED)
Heparin Unfractionated: <0.1 IU/mL — ABNORMAL LOW (ref 0.30–0.70)
Heparin Unfractionated: 0.26 IU/mL — ABNORMAL LOW (ref 0.30–0.70)

## 2020-09-04 LAB — TSH: TSH: 2.924 u[IU]/mL (ref 0.350–4.500)

## 2020-09-04 SURGERY — LEFT HEART CATH AND CORONARY ANGIOGRAPHY
Anesthesia: LOCAL

## 2020-09-04 MED ORDER — HEPARIN (PORCINE) 25000 UT/250ML-% IV SOLN
1050.0000 [IU]/h | INTRAVENOUS | Status: AC
  Administered 2020-09-04: 850 [IU]/h via INTRAVENOUS
  Administered 2020-09-05: 1050 [IU]/h via INTRAVENOUS
  Filled 2020-09-04 (×2): qty 250

## 2020-09-04 MED ORDER — EZETIMIBE 10 MG PO TABS
10.0000 mg | ORAL_TABLET | Freq: Every day | ORAL | Status: DC
  Administered 2020-09-04 – 2020-09-07 (×4): 10 mg via ORAL
  Filled 2020-09-04 (×4): qty 1

## 2020-09-04 MED ORDER — NITROGLYCERIN 0.4 MG SL SUBL: 0.4000 mg | SUBLINGUAL_TABLET | SUBLINGUAL | Status: DC | PRN

## 2020-09-04 MED ORDER — HEPARIN SODIUM (PORCINE) 5000 UNIT/ML IJ SOLN
60.0000 [IU]/kg | Freq: Once | INTRAMUSCULAR | Status: AC
  Administered 2020-09-04: 4000 [IU] via INTRAVENOUS

## 2020-09-04 MED ORDER — NITROGLYCERIN IN D5W 200-5 MCG/ML-% IV SOLN
0.0000 ug/min | INTRAVENOUS | Status: DC
  Administered 2020-09-04: 5 ug/min via INTRAVENOUS
  Filled 2020-09-04: qty 250

## 2020-09-04 MED ORDER — CARVEDILOL 25 MG PO TABS
25.0000 mg | ORAL_TABLET | Freq: Two times a day (BID) | ORAL | Status: DC
  Administered 2020-09-04 – 2020-09-06 (×4): 25 mg via ORAL
  Filled 2020-09-04 (×2): qty 2
  Filled 2020-09-04 (×2): qty 1

## 2020-09-04 MED ORDER — SODIUM CHLORIDE 0.9 % IV SOLN: INTRAVENOUS | Status: DC

## 2020-09-04 MED ORDER — ASPIRIN 81 MG PO CHEW
324.0000 mg | CHEWABLE_TABLET | Freq: Once | ORAL | Status: AC
  Administered 2020-09-04: 243 mg via ORAL

## 2020-09-04 NOTE — Consult Note (Addendum)
Error in charting.

## 2020-09-04 NOTE — ED Notes (Signed)
RN reported to Dr. Jeanell Sparrow trop [phone]

## 2020-09-04 NOTE — H&P (Signed)
Cardiology Admission History and Physical:   Patient ID: Katelyn Powers MRN: [id number]; DOB: 1932/08/01   Admission date: 09/04/2020  Primary Care Provider: Isaias Sakai, [place] Cardiologist: Sinclair Grooms, MD   Chief Complaint: Chest Pain   Patient Profile:   Katelyn Powers is a 86 y.o. female with a hx of mild aortic stenosis, bilateral carotid artery disease, memory loss, HTN and HLD who is being seen today for the evaluation of chest pain at the request of Dr. Jeanell Sparrow.  History of Present Illness:   Katelyn Powers is an 86yo F with a hx as stated above who presented to [facility] 09/04/20 with chest pressure which was reported as starting this morning around 4am. Pt reports that she woke up from sleep to get some water and had left sided neck pain although she cannot fully recall. Daughter is at bedside who helps with HPI. She states that the patient called her early this morning with chest pressure and left breast pain with associated dizziness. Pt has a hx of dementia. On EMS arrival, patient was given SL NTGx2 with resolution. EKG per EMS with slight ST elevation in inferior leads therefore Code STEMI was called.   Pt then seen by STEMI MD, Dr. Martinique. At that time, the patient denied chest pain recurrence however again, with an unreliable hx given dementia. On his review, EKG did not meet STEMI criteria. Given that she was pain free, along with commodities including advanced age and dementia, STEMI was cancelled. Initial hsT found to be 223 with a repeat at 958. Heparin and NTG infusions were started. BP was elevated on ED arrival and remains elevated with SBPs in the 160-170 range. LDL calculated at 138. Creatinine stable at 0.94.   She is typically follow by [name] for her cardiology care, last seen 08/31/2018. She was found to have mild aortic stenosis which has been followed clinically by [name]. Last echocardiogram 06/03/2018 with normal LVEF at 55-60% with normal wall  motion, G1DD, aortic sclerosis and very mild stenosis, mild MR.   Past Medical History:  Diagnosis Date  . Cataract   . Gout   . Hyperlipidemia   . Hypertension   . Memory loss     Past Surgical History:  Procedure Laterality Date  . MELANOMA EXCISION Right 1972   level 2  . 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 Authorizing Provider  amLODipine (NORVASC) 10 MG tablet Take 10 mg by mouth daily.    Yes [provider]  aspirin 81 MG tablet Take 81 mg by mouth daily.   Yes [provider]  Cholecalciferol (VITAMIN D3) 1000 units CAPS Take 1,000 Units by mouth daily.    Yes [provider]  Docosahexaenoic Acid (DHA COMPLETE PO) Take 1 capsule by mouth daily.    Yes [provider]  losartan (COZAAR) 100 MG tablet Take 100 mg by mouth daily.  04/13/18  Yes [provider]  Melatonin 10 MG TABS Take 10 mg by mouth at bedtime.    Yes [provider]  omega-3 acid ethyl esters (LOVAZA) 1 g capsule Take 1 g by mouth 2 (two) times daily.   Yes [provider]     Allergies:   No Known Allergies  Social History:   Social History   Socioeconomic History  .  Marital status: Widowed    Spouse name: Not on file  . Number of children: Not on file  . Years of education: Not on file  . Highest education level: Not on file  Occupational History  . Not on file  Tobacco Use  . Smoking status: Former Research scientist (life sciences)  . Smokeless tobacco: Never Used  Substance and Sexual Activity  . Alcohol use: No    Alcohol/week: 0.0 standard drinks  . Drug use: No  . Sexual activity: Not on file  Other Topics Concern  . Not on file  Social History Narrative  . Not on file   Social Determinants of Health   Financial Resource Strain:   . Difficulty of Paying Living Expenses: Not on file  Food  Insecurity:   . Worried About Charity fundraiser in the Last Year: Not on file  . Ran Out of Food in the Last Year: Not on file  Transportation Needs:   . Lack of Transportation (Medical): Not on file  . Lack of Transportation (Non-Medical): Not on file  Physical Activity:   . Days of Exercise per Week: Not on file  . Minutes of Exercise per Session: Not on file  Stress:   . Feeling of Stress : Not on file  Social Connections:   . Frequency of Communication with Friends and Family: Not on file  . Frequency of Social Gatherings with Friends and Family: Not on file  . Attends Religious Services: Not on file  . Active Member of Clubs or Organizations: Not on file  . Attends Archivist Meetings: Not on file  . Marital Status: Not on file  Intimate Partner Violence:   . Fear of Current or Ex-Partner: Not on file  . Emotionally Abused: Not on file  . Physically Abused: Not on file  . Sexually Abused: Not on file   Family History:  The patient's family history includes Colon cancer (age of onset: 90) in her sister; Colon polyps in her brother.    ROS:  Please see the history of present illness.  All other ROS reviewed and negative.     Physical Exam/Data:   Vitals:   09/04/20 1430 09/04/20 1500 09/04/20 1530 09/04/20 1548  BP: (!) 161/109 (!) 164/91 139/87 135/85  Pulse: 73 77 78 83  Resp: 12 11 16    Temp:      TempSrc:      SpO2: 97% 96% 97%    No intake or output data in the 24 hours ending 09/04/20 1552 Last 3 Weights 05/10/2020 02/22/2020 10/27/2019  Weight (lbs) 145 lb 153 lb 8 oz 156 lb  Weight (kg) 65.772 kg 69.627 kg 70.761 kg     There is no height or weight on file to calculate BMI.   General: Elderly, NAD Skin: Warm, dry, intact  Neck: Negative for carotid bruits. No JVD Lungs:Clear to ausculation bilaterally. No wheezes, rales, or rhonchi. Breathing is unlabored. Cardiovascular: RRR with S1 S2. + AS murmur Abdomen: Soft, non-tender, non-distended.  No obvious abdominal masses. Extremities: No edema. Radial pulses 2+ bilaterally Neuro: Alert and oriented. No focal deficits. No facial asymmetry. MAE spontaneously. Psych: Responds to questions appropriately with normal affect.     EKG:  The EKG was personally reviewed and demonstrates: 09/04/20 NSR HR 70 with ST elevations in leads II, III, avF. Repeat tracing with resolution.  Telemetry:  Telemetry was personally reviewed and demonstrates:  09/04/20 NSR with HR 70's   Relevant CV Studies:  Echocardiogram 05/2018: Study  Conclusions  - Left ventricle: The cavity size was normal. Wall thickness was increased in a pattern of mild LVH. There was mild focal basal hypertrophy of the septum. Systolic function was normal. The estimated ejection fraction was in the range of 55% to 60%. Wall motion was normal; there were no regional wall motion abnormalities. Doppler parameters are consistent with abnormal left ventricular relaxation (grade 1 diastolic dysfunction). - Aortic valve: Sclerosis without significant stenosis. There was very mild stenosis, peak velocity less than 2 m/s. There was trivial regurgitation. - Mitral valve: There was mild regurgitation. - Left atrium: The atrium was mildly dilated. - Right ventricle: Systolic function was normal. - Atrial septum: No defect or patent foramen ovale was identified. - Tricuspid valve: There was trivial regurgitation. - Pulmonic valve: There was mild regurgitation.  Impressions:  - Normal LV EF. Aortic valve is calcified but visibly opens on parasternal long and short axis views.  Laboratory Data:  High Sensitivity Troponin:   Recent Labs  Lab 09/04/20 0918 09/04/20 1137  TROPONINIHS 223* 958*      Chemistry Recent Labs  Lab 09/04/20 0918  NA 138  K 3.7  CL 103  CO2 21*  GLUCOSE 137*  BUN 19  CREATININE 0.94  CALCIUM 9.8  GFRNONAA 59*  ANIONGAP 14    No results for input(s): PROT, ALBUMIN,  AST, ALT, ALKPHOS, BILITOT in the last 168 hours. Hematology Recent Labs  Lab 09/04/20 0918  WBC 9.7  RBC 4.44  HGB 13.5  HCT 41.8  MCV 94.1  MCH 30.4  MCHC 32.3  RDW 13.9  PLT 205   BNPNo results for input(s): BNP, PROBNP in the last 168 hours.  DDimer No results for input(s): DDIMER in the last 168 hours.   Radiology/Studies:  DG Chest 2 View  Result Date: 09/04/2020 CLINICAL DATA:  Code STEMI.  Chest pressure. EXAM: CHEST - 2 VIEW COMPARISON:  Mar 07, 2018. FINDINGS: The heart size and mediastinal contours are within normal limits. Aortic atherosclerosis. No consolidation. No visible pleural effusions or pneumothorax. Right axillary clips. Remote right rib fractures. IMPRESSION: No acute cardiopulmonary disease. Electronically Signed   By: Margaretha Sheffield MD   On: 09/04/2020 09:56   Assessment and Plan:   1. NSTEMI: -Pt presented to [facility] after an episode of chest pressure 09/04/20 with chest pressure which was reported as starting this morning around 4am. Pt has a hx of dementia therefore HPI obtained mainly from chart review. On EMS arrival, patient was given SL NTGx2 with resolution. EKG per EMS with slight ST elevation in inferior leads therefore Code STEMI was called.  -Seen by STEMI MD, Dr. Martinique. At that time, the patient denied chest pain recurrence however again, with an unreliable hx given dementia. On his review, EKG did not meet STEMI criteria.  -Given that she was pain free, along with commodities including advanced age and dementia, STEMI was cancelled.  -Initial hsT found to be 223 with a repeat at 958>>pt was started on Heparin infusion for ACS -Repeat EKG with resolution of ST elevations -Currently pain free on NTG and Heparin  -Continue ASA, will start carvedilol  -Intolerant to statin therapy>>consider OP lipid clinic referral for PCSK9 inhibitor however will start Zetia 10 -LDL calculated at 138. Creatinine stable at 0.94.  -Repeat echocardiogram while  hospitalized  -Cardiac catheterization was discussed with the patient fully. The patient understands that risks include but are not limited to stroke (1 in 1000), death (1 in 56), kidney failure [usually temporary] (  1 in 500), bleeding (1 in 200), allergic reaction [possibly serious] (1 in 200). The patient understands and is willing to proceed.   2. HLD: -LDL, 138 -On PTA Lovaza -Intolerant to statin therapy>>consider OP lipid clinic referral for PCSK9 inhibitor however will start Zetia   3. HTN: -Elevated, 171/80>>156/70>>146/82 -On PTA amlodipine 10, losartan 100 -Start carvedilol 25mg  BID   4. Dementia: -Daughter at bedside who assists with HPI  5. Bilateral carotid artery disease: -Most recent carotid doppler study 07/22/18 with right ICA are consistent with a 60-79% stenosis and left ICA are consistent with a 1-39%.  -Continue ASA -Refer to lipid clinic for possible PCSK9 inhibitor however will start Zetia         TIMI Risk Score for Unstable Angina or Non-ST Elevation MI:   The patient's TIMI risk score is 5, which indicates a 26% risk of all cause mortality, new or recurrent myocardial infarction or need for urgent revascularization in the next 14 days.      Severity of Illness: The appropriate patient status for this patient is INPATIENT. Inpatient status is judged to be reasonable and necessary in order to provide the required intensity of service to ensure the patient's safety. The patient's presenting symptoms, physical exam findings, and initial radiographic and laboratory data in the context of their chronic comorbidities is felt to place them at high risk for further clinical deterioration. Furthermore, it is not anticipated that the patient will be medically stable for discharge from the hospital within 2 midnights of admission. The following factors support the patient status of inpatient.   " The patient's presenting symptoms include chest pain. " The  worrisome physical exam findings include chest pain. " The initial radiographic and laboratory data are worrisome because of elevated hsT. " The chronic co-morbidities include HTN, HLD, carotid artery disease.   * I certify that at the point of admission it is my clinical judgment that the patient will require inpatient hospital care spanning beyond 2 midnights from the point of admission due to high intensity of service, high risk for further deterioration and high frequency of surveillance required.*    For questions or updates, please contact [place] Please consult [email or web address] for contact info under     Signed, Kathyrn Drown, NP  09/04/2020 3:52 PM

## 2020-09-04 NOTE — ED Triage Notes (Addendum)
Pt was brought by EMS. Pt was woken up with chest pressure and dizziness. 81 aspirin and 2 nitro, 100 CC bolus were given via EMS . 20 G in left hand 100 cc bolus. Pt has a hx of hypertension. Pt is alert.   EMS vitals  BP- 178/92 HR-80 R-18 O2-95% RA T-97.9 Oral

## 2020-09-04 NOTE — Progress Notes (Addendum)
ANTICOAGULATION CONSULT NOTE - Follow Up Consult  Pharmacy Consult for IV Heparin Indication: chest pain/ACS  No Known Allergies  Vital Signs: Temp: 97.4 F (36.3 C) (11/08 1218) Temp Source: Oral (11/08 1218) BP: 98/86 (11/08 1800) Pulse Rate: 66 (11/08 1800)  Labs: Recent Labs    09/04/20 0918 09/04/20 0918 09/04/20 0923 09/04/20 1002 09/04/20 1137 09/04/20 1628 09/04/20 1904 09/04/20 1912  HGB 13.5  --   --   --   --   --   --   --   HCT 41.8  --   --   --   --   --   --   --   PLT 205  --   --   --   --   --   --   --   APTT  --   --  24  --   --   --   --   --   LABPROT  --   --  13.3  --   --   --   --   --   INR  --   --  1.1  --   --   --   --   --   HEPARINUNFRC  --   --   --  <0.10*  --   --   --  0.26*  CREATININE 0.94  --   --   --   --   --   --   --   TROPONINIHS 223*   < >  --   --  958* 1,460* 1,633*  --    < > = values in this interval not displayed.    CrCl cannot be calculated (Unknown ideal weight.).  Patient Measurements: Total Body Weight: 70.3 kg  Height: 62 inches Heparin Dosing Weight: 65 kg  Medical History: Past Medical History:  Diagnosis Date  . Cataract   . Gout   . Hyperlipidemia   . Hypertension   . Memory loss     Assessment: 86 yr old female presented with chest pressure as code STEM. Pharmacy was consulted to dose IV heparin; pt was not on anticoagulation PTA. Pt rec'd heparin 4000 units IV bolus X 1 in ED prior to pharmacy consult.  Initial heparin level ~9 hrs after initiation of heparin infusion at 850 units/hr was 0.26 units/ml, which is below the goal range for this pt. H/H, platelets WNL. Per ED RN, no issues with IV or bleeding observed.  Per Cardiology, pt did not meet criteria for STEMI; cardiac cath planned for 11/9 late AM.  Goal of Therapy:  Heparin level 0.3-0.7 units/ml Monitor platelets by anticoagulation protocol: Yes   Plan:  Increase heparin infusion to 950 units/hr Check heparin level in 8  hrs Monitor daily heparin level, CBC Monitor for signs/symptoms of bleeding  Gillermina Hu, PharmD, BCPS, [facility] Clinical Pharmacist 09/04/2020 8:44 PM

## 2020-09-04 NOTE — Progress Notes (Signed)
Interventional cardiology note.  86 yo WF with history of mild AS, HTN apparently awoke today at 4 am with chest pressure. Patient has dementia and is unable to give reliable history. States her chest never hurt and notes more pain in her left face. According to EMS chest pain resolved with sl Ntg x 2. Ecgs done by EMS showed slight ST elevation in the inferior leads and Code STEMI activated.   Currently patient denies any chest pain or discomfort. She is significantly hypertensive. Ecgs by EMS and here reviewed. She has slight ST elevation in leads 3 and AVF but doesn't meet STEMI criteria.   Since she is currently pain free and has comorbidities including advanced age and dementia CODE STEMI cancelled. Discussed with EDP who will evaluate and initiate medical therapy.   Gerlad Pelzel Martinique MD, [facility]

## 2020-09-04 NOTE — Telephone Encounter (Signed)
New Message:      Daughter called and said pt is in [facility] ER and is going to be admitted. SHe would like for Dr Tamala Julian to call her asap please. She wants to.talk to Dr Tamala Julian and getting some understanding and directions on what they need to do please. She thinks they are going to do a Cath and Stent tomorrow .

## 2020-09-04 NOTE — Progress Notes (Signed)
   09/04/20 0907  Clinical Encounter Type  Visited With Patient  Visit Type Code  Referral From Other (Comment) (Code Stemi Page )  Consult/Referral To [employer] responded to code, Pt was stable upon arrival, no family present at this time.  Chaplain was not needed.

## 2020-09-04 NOTE — ED Notes (Signed)
RN reported critical lab trop 223 to Dr. Jeanell Sparrow

## 2020-09-04 NOTE — ED Provider Notes (Signed)
[facility] EMERGENCY DEPARTMENT Provider Note   CSN: [id number] Arrival date & time: 09/04/20  4098     History Chief Complaint  Patient presents with  . Code STEMI    Katelyn Powers is a 86 y.o. female.  HPI Level 5 caveat secondary to dementia History obtained from EMS and daughter who is later at bedside 86 year old female who presents today as code STEMI.  Code STEMI activated in the field by EMS.  Per EMS report patient woke up around 4 AM and had chest pain.  She called her daughter.  Her daughters came to the house and called EMS.  EMS reports some initial ST elevation in 2 3 and aVF with the patient complaining of left-sided chest pressure.  Patient received nitroglycerin x2 with resolution of pain.  Currently the patient denies any chest pain. Patient was met at bedside by cardiology team.  EKG did not meet STEMI criteria and patient not having pain at that time.  Code STEMI canceled by Dr. Martinique. Patient has not been vaccinated has not had known Covid.    Past Medical History:  Diagnosis Date  . Cataract   . Gout   . Hyperlipidemia   . Hypertension   . Memory loss     Patient Active Problem List   Diagnosis Date Noted  . Vitamin D deficiency 10/27/2019  . Excessive sleepiness 10/27/2019  . Benign essential HTN 07/27/2018  . Disorder of arteries and arterioles (Blue) 07/27/2018  . Hyperlipidemia 07/27/2018  . Memory loss 07/27/2018  . Insomnia 07/27/2018  . Abnormal brain MRI 07/27/2018  . Carotid stenosis, right 07/27/2018    Past Surgical History:  Procedure Laterality Date  . MELANOMA EXCISION Right 1972   level 2  . 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     Family History  Problem Relation Age of Onset  . Colon cancer Sister 59  . Colon polyps Brother     Social History   Tobacco Use  . Smoking status: Former Research scientist (life sciences)  . Smokeless tobacco: Never  Used  Substance Use Topics  . Alcohol use: No    Alcohol/week: 0.0 standard drinks  . Drug use: No    Home Medications Prior to Admission medications   Medication Sig Start Date End Date Taking? Authorizing Provider  amLODipine (NORVASC) 10 MG tablet Take 10 mg by mouth daily.    [provider]  aspirin 81 MG tablet Take 81 mg by mouth daily.    [provider]  Cholecalciferol (VITAMIN D3) 1000 units CAPS Take by mouth.    [provider]  Docosahexaenoic Acid (DHA COMPLETE PO) Take by mouth.    [provider]  losartan (COZAAR) 100 MG tablet Take 100 mg by mouth daily.  04/13/18   [provider]  Melatonin 10 MG TABS Take 1 tablet by mouth at bedtime.    [provider]    Allergies    Patient has no known allergies.  Review of Systems   Review of Systems  All other systems reviewed and are negative.   Physical Exam Updated Vital Signs There were no vitals taken for this visit.  Physical Exam Vitals and nursing note reviewed.  Constitutional:      General: She is not in acute distress.    Appearance: Normal appearance. She is not ill-appearing.  HENT:     Head:  Normocephalic.     Right Ear: External ear normal.     Left Ear: External ear normal.     Nose: Nose normal.  Eyes:     Extraocular Movements: Extraocular movements intact.     Pupils: Pupils are equal, round, and reactive to light.  Cardiovascular:     Rate and Rhythm: Normal rate and regular rhythm.     Pulses: Normal pulses.     Heart sounds: Murmur heard.  Systolic murmur is present with a grade of 3/6.   Pulmonary:     Effort: Pulmonary effort is normal.     Breath sounds: Normal breath sounds.  Abdominal:     General: Abdomen is flat.     Palpations: Abdomen is soft.  Musculoskeletal:        General: Normal range of motion.     Cervical back: Normal range of motion.  Skin:    Capillary Refill: Capillary refill takes less than 2 seconds.    Neurological:     General: No focal deficit present.     Mental Status: She is alert.     Cranial Nerves: No cranial nerve deficit.     Motor: No weakness.     Comments: Patient oriented to person place but not to date or time  Psychiatric:        Mood and Affect: Mood normal.     ED Results / Procedures / Treatments   Labs (all labs ordered are listed, but only abnormal results are displayed) Labs Reviewed  BASIC METABOLIC PANEL  CBC  HEMOGLOBIN A1C  PROTIME-INR  APTT  LIPID PANEL  TROPONIN I (HIGH SENSITIVITY)    EKG EKG Interpretation  Date/Time:  Monday September 04 2020 09:15:10 EST Ventricular Rate:  70 PR Interval:    QRS Duration: 88 QT Interval:  396 QTC Calculation: 428 R Axis:   63 Text Interpretation: Sinus rhythm Minimal ST elevation, inferior leads ST elevation in ii, iii, and aVF new from first prior of 13 June 2018 Confirmed by Pattricia Boss [phone]) on 09/04/2020 9:33:04 AM   Radiology No results found.  Procedures .Critical Care Performed by: Pattricia Boss, MD Authorized by: Pattricia Boss, MD   Critical care provider statement:    Critical care time (minutes):  45   Critical care end time:  09/04/2020 3:01 PM   Critical care was necessary to treat or prevent imminent or life-threatening deterioration of the following conditions:  Cardiac failure   Critical care was time spent personally by me on the following activities:  Discussions with consultants, evaluation of patient's response to treatment, examination of patient, ordering and performing treatments and interventions, ordering and review of laboratory studies, ordering and review of radiographic studies, pulse oximetry, re-evaluation of patient's condition, obtaining history from patient or surrogate and review of old charts   (including critical care time)  Medications Ordered in ED Medications  0.9 %  sodium chloride infusion (has no administration in time range)  aspirin chewable  tablet 324 mg (243 mg Oral Given 09/04/20 0924)  heparin injection 60 Units/kg (4,000 Units Intravenous Given 09/04/20 7622)    ED Course  I have reviewed the triage vital signs and the nursing notes.  Pertinent labs & imaging results that were available during my care of the patient were reviewed by me and considered in my medical decision making (see chart for details).  Clinical Course as of Sep 05 1207  [facility] Sep 04, 2020  1141 Labs, EKG, chest x-Anuradha Chabot, reviewed and interpretations  reviewed   [DR]    Clinical Course User Index [DR] Pattricia Boss, MD   MDM Rules/Calculators/A&P                          Patient received aspirin heparin prior to my evaluation 10:43 AM Initial troponin reported as elevated at 223 Cardiology reconsulted Discussed with Wannetta Sender and they will see Discussed with daughters- Amy via phone and daughter at bedside They wish aggressive treatment  Patient with some recurrent chest pain- will start iv nitro, repeat ekg Relayed via phone, daughter's wishes, elevated troponin, and some return of chest pain to Dr. Martinique via nurse.  Final Clinical Impression(s) / ED Diagnoses Final diagnoses:  NSTEMI (non-ST elevated myocardial infarction) [facility])    Rx / Belk Orders ED Discharge Orders    None       Pattricia Boss, MD 09/04/20 1502

## 2020-09-04 NOTE — Telephone Encounter (Signed)
Spoke with daughter and made her aware that [name] is on vacation this week.  Daughter had questions about anitplatlelet medication mentioned by Dr Audie Box.  She had concerns about if she missed doses that the stent would clot off.  Explained the reason for this medication and the importance and not missing doses.  Daughter also concerned about pt being started on a statin.  States pt had issues with Atorvastatin previously.  Discussed why a statin would be beneficial.  Went ahead and scheduled pt for hospital f/u.  Daughter very appreciative for call.

## 2020-09-04 NOTE — Progress Notes (Signed)
ANTICOAGULATION CONSULT NOTE - Initial Consult  Pharmacy Consult for heparin Indication: chest pain/ACS  No Known Allergies  Patient Measurements:   Heparin Dosing Weight: 70kg  Vital Signs:    Labs: No results for input(s): HGB, HCT, PLT, APTT, LABPROT, INR, HEPARINUNFRC, HEPRLOWMOCWT, CREATININE, CKTOTAL, CKMB, TROPONINIHS in the last 72 hours.  CrCl cannot be calculated (Patient's most recent lab result is older than the maximum 21 days allowed.).   Medical History: Past Medical History:  Diagnosis Date  . Cataract   . Gout   . Hyperlipidemia   . Hypertension   . Memory loss     Assessment: 32 YOF presenting as code STEMI, not on anticoagulation PTA.  Given 4000 units heparin IV x 1 in ED.    Goal of Therapy:  Heparin level 0.3-0.7 units/ml Monitor platelets by anticoagulation protocol: Yes   Plan:  Heparin 850 units/hr, no bolus F/u 8 hour heparin level  Bertis Ruddy, PharmD Clinical Pharmacist ED Pharmacist Phone # [phone] 09/04/2020 9:34 AM

## 2020-09-05 ENCOUNTER — Encounter (HOSPITAL_COMMUNITY): Payer: Self-pay | Admitting: Cardiovascular Disease

## 2020-09-05 ENCOUNTER — Inpatient Hospital Stay (HOSPITAL_COMMUNITY): Payer: Medicare Other

## 2020-09-05 ENCOUNTER — Other Ambulatory Visit: Payer: Self-pay

## 2020-09-05 ENCOUNTER — Encounter (HOSPITAL_COMMUNITY)
Admission: EM | Disposition: A | Discharge status: Home Health | Payer: Self-pay | Source: Home / Self Care | Attending: Cardiovascular Disease

## 2020-09-05 DIAGNOSIS — I214 Non-ST elevation (NSTEMI) myocardial infarction: Secondary | ICD-10-CM | POA: Diagnosis not present

## 2020-09-05 DIAGNOSIS — I351 Nonrheumatic aortic (valve) insufficiency: Secondary | ICD-10-CM

## 2020-09-05 LAB — CBC
HCT: 37 % (ref 36.0–46.0)
HCT: 37.8 % (ref 36.0–46.0)
Hemoglobin: 12 g/dL (ref 12.0–15.0)
Hemoglobin: 12.3 g/dL (ref 12.0–15.0)
MCH: 30.5 pg (ref 26.0–34.0)
MCH: 30.6 pg (ref 26.0–34.0)
MCHC: 32.4 g/dL (ref 30.0–36.0)
MCHC: 32.5 g/dL (ref 30.0–36.0)
MCV: 93.9 fL (ref 80.0–100.0)
MCV: 94 fL (ref 80.0–100.0)
Platelets: 190 10*3/uL (ref 150–400)
Platelets: 192 10*3/uL (ref 150–400)
RBC: 3.94 MIL/uL (ref 3.87–5.11)
RBC: 4.02 MIL/uL (ref 3.87–5.11)
RDW: 14 % (ref 11.5–15.5)
RDW: 14 % (ref 11.5–15.5)
WBC: 8.6 10*3/uL (ref 4.0–10.5)
WBC: 8.4 10*3/uL (ref 4.0–10.5)
nRBC: 0 % (ref 0.0–0.2)
nRBC: 0 % (ref 0.0–0.2)

## 2020-09-05 LAB — TROPONIN I (HIGH SENSITIVITY)
Troponin I (High Sensitivity): 2123 ng/L (ref <18)
Troponin I (High Sensitivity): 1807 ng/L (ref <18)

## 2020-09-05 LAB — BASIC METABOLIC PANEL
Anion gap: 10 (ref 5–15)
BUN: 20 mg/dL (ref 8–23)
CO2: 24 mmol/L (ref 22–32)
Calcium: 9.5 mg/dL (ref 8.9–10.3)
Chloride: 106 mmol/L (ref 98–111)
Creatinine, Ser: 1 mg/dL (ref 0.44–1.00)
GFR, Estimated: 55 mL/min — ABNORMAL LOW (ref >60)
Glucose, Bld: 106 mg/dL — ABNORMAL HIGH (ref 70–99)
Potassium: 3.7 mmol/L (ref 3.5–5.1)
Sodium: 140 mmol/L (ref 135–145)

## 2020-09-05 LAB — HEPARIN LEVEL (UNFRACTIONATED)
Heparin Unfractionated: 0.25 IU/mL — ABNORMAL LOW (ref 0.30–0.70)
Heparin Unfractionated: 0.52 IU/mL (ref 0.30–0.70)

## 2020-09-05 LAB — ECHOCARDIOGRAM COMPLETE
Area-P 1/2: 2.95 cm2
P 1/2 time: 476 ms
S' Lateral: 2.6 cm
Weight: 2479.73 [oz_av]

## 2020-09-05 SURGERY — LEFT HEART CATH AND CORONARY ANGIOGRAPHY
Anesthesia: LOCAL

## 2020-09-05 MED ORDER — ASPIRIN 81 MG PO CHEW
324.0000 mg | CHEWABLE_TABLET | ORAL | Status: AC
  Administered 2020-09-05: 324 mg via ORAL
  Filled 2020-09-05: qty 4

## 2020-09-05 MED ORDER — AMLODIPINE BESYLATE 10 MG PO TABS
10.0000 mg | ORAL_TABLET | Freq: Every day | ORAL | Status: DC
  Administered 2020-09-05 – 2020-09-06 (×2): 10 mg via ORAL
  Filled 2020-09-05: qty 2
  Filled 2020-09-05: qty 1

## 2020-09-05 MED ORDER — ASPIRIN EC 81 MG PO TBEC
81.0000 mg | DELAYED_RELEASE_TABLET | Freq: Every day | ORAL | Status: DC
  Administered 2020-09-06 – 2020-09-07 (×2): 81 mg via ORAL
  Filled 2020-09-05 (×3): qty 1

## 2020-09-05 MED ORDER — ACETAMINOPHEN 325 MG PO TABS: 650.0000 mg | ORAL_TABLET | ORAL | Status: DC | PRN

## 2020-09-05 MED ORDER — ASPIRIN 81 MG PO TABS: 81.0000 mg | ORAL_TABLET | Freq: Every day | ORAL | Status: DC

## 2020-09-05 MED ORDER — ASPIRIN 300 MG RE SUPP: 300.0000 mg | RECTAL | Status: AC

## 2020-09-05 MED ORDER — CLOPIDOGREL BISULFATE 75 MG PO TABS
75.0000 mg | ORAL_TABLET | Freq: Every day | ORAL | Status: DC
  Administered 2020-09-06 – 2020-09-07 (×2): 75 mg via ORAL
  Filled 2020-09-05 (×2): qty 1

## 2020-09-05 MED ORDER — NITROGLYCERIN 0.4 MG SL SUBL: 0.4000 mg | SUBLINGUAL_TABLET | SUBLINGUAL | Status: DC | PRN

## 2020-09-05 MED ORDER — MELATONIN 3 MG PO TABS
3.0000 mg | ORAL_TABLET | Freq: Every day | ORAL | Status: DC | PRN
  Administered 2020-09-05: 3 mg via ORAL
  Filled 2020-09-05 (×2): qty 1

## 2020-09-05 MED ORDER — ONDANSETRON HCL 4 MG/2ML IJ SOLN: 4.0000 mg | Freq: Four times a day (QID) | INTRAMUSCULAR | Status: DC | PRN

## 2020-09-05 MED ORDER — LORAZEPAM 0.5 MG PO TABS
0.5000 mg | ORAL_TABLET | Freq: Every day | ORAL | Status: DC | PRN
  Administered 2020-09-05: 0.5 mg via ORAL
  Filled 2020-09-05: qty 1

## 2020-09-05 MED ORDER — CLOPIDOGREL BISULFATE 300 MG PO TABS
600.0000 mg | ORAL_TABLET | Freq: Once | ORAL | Status: AC
  Administered 2020-09-05: 600 mg via ORAL
  Filled 2020-09-05: qty 2

## 2020-09-05 MED ORDER — LOSARTAN POTASSIUM 50 MG PO TABS
100.0000 mg | ORAL_TABLET | Freq: Every day | ORAL | Status: DC
  Administered 2020-09-05 – 2020-09-06 (×2): 100 mg via ORAL
  Filled 2020-09-05 (×2): qty 2

## 2020-09-05 NOTE — Progress Notes (Signed)
  Echocardiogram 2D Echocardiogram has been performed.  Jennette Dubin 09/05/2020, 9:56 AM

## 2020-09-05 NOTE — ED Notes (Signed)
Lunch Tray Ordered @ 1033. 

## 2020-09-05 NOTE — ED Notes (Addendum)
Pt placed on purewick at this time, pt monitoring leads replaced. It continuous removing leads and BP cuff, pt responds to redirection with verbalized understanding. Pt actions doesn't second verbal understanding. Pt daughter remains at bedside. Pt removed IV, warm compress applied to site.

## 2020-09-05 NOTE — Progress Notes (Signed)
Progress Note  Patient Name: Katelyn Powers Date of Encounter: 09/05/2020  [facility] Cardiologist: Sinclair Grooms, MD   Subjective   Patient had worsening confusion/mild agitation overnight. She frequently was trying to remove leads and BP cuff but was redirectable. She also removed IV overnight. She denies any chest pain or shortness of breath at this time.  Inpatient Medications    Scheduled Meds: . carvedilol  25 mg Oral BID WC  . ezetimibe  10 mg Oral Daily   Continuous Infusions: . sodium chloride 10 mL/hr at 09/04/20 1020  . heparin 1,050 Units/hr (09/05/20 0438)   PRN Meds: nitroGLYCERIN   Vital Signs    Vitals:   09/05/20 0015 09/05/20 0145 09/05/20 0440 09/05/20 0530  BP: (!) 112/43 (!) 144/96 (!) 156/93 (!) 151/73  Pulse: 65 67 66 64  Resp: 14 14 16 17   Temp:      TempSrc:      SpO2: 94% 95% 97% 95%  Weight:       No intake or output data in the 24 hours ending 09/05/20 0650 Last 3 Weights 09/04/2020 05/10/2020 02/22/2020  Weight (lbs) 154 lb 15.7 oz 145 lb 153 lb 8 oz  Weight (kg) 70.3 kg 65.772 kg 69.627 kg      Telemetry    Normal sinus rhythm with rates in the 60's to 80's. Some PVCs and short runs of NSVT (3 beats) noted. - Personally Reviewed  ECG    No new ECG tracing today. - Personally Reviewed  Physical Exam   GEN: No acute distress.   Neck: Supple. Cardiac: RRR. III/VI systolic murmur noted. No rubs or gallops. Radial pulses 2+ and equal bilaterally.  Respiratory: Clear to auscultation bilaterally. No wheezes, rales, or rhonchi. GI: Soft, non-tender, non-distended  MS: No lower extremity. No deformity. Neuro:  No focal deficits. Psych: Normal affect.  Labs    High Sensitivity Troponin:   Recent Labs  Lab 09/04/20 0918 09/04/20 1137 09/04/20 1628 09/04/20 1904  TROPONINIHS 223* 958* 1,460* 1,633*      Chemistry Recent Labs  Lab 09/04/20 0918 09/05/20 0451  NA 138 140  K 3.7 3.7  CL 103 106  CO2 21* 24  GLUCOSE  137* 106*  BUN 19 20  CREATININE 0.94 1.00  CALCIUM 9.8 9.5  GFRNONAA 59* 55*  ANIONGAP 14 10     Hematology Recent Labs  Lab 09/04/20 0918 09/05/20 0312 09/05/20 0451  WBC 9.7 8.6 8.4  RBC 4.44 3.94 4.02  HGB 13.5 12.0 12.3  HCT 41.8 37.0 37.8  MCV 94.1 93.9 94.0  MCH 30.4 30.5 30.6  MCHC 32.3 32.4 32.5  RDW 13.9 14.0 14.0  PLT 205 190 192    BNPNo results for input(s): BNP, PROBNP in the last 168 hours.   DDimer No results for input(s): DDIMER in the last 168 hours.   Radiology    DG Chest 2 View  Result Date: 09/04/2020 CLINICAL DATA:  Code STEMI.  Chest pressure. EXAM: CHEST - 2 VIEW COMPARISON:  Mar 07, 2018. FINDINGS: The heart size and mediastinal contours are within normal limits. Aortic atherosclerosis. No consolidation. No visible pleural effusions or pneumothorax. Right axillary clips. Remote right rib fractures. IMPRESSION: No acute cardiopulmonary disease. Electronically Signed   By: Margaretha Sheffield MD   On: 09/04/2020 09:56    Cardiac Studies   Echo pending.  Patient Profile     86 y.o. female with a history of mild aortic stenosis, bilateral carotid artery disease,  hypertension, hyperlipidemia, and dementia who was admitted with NSTEMI on 09/04/2020.  Assessment & Plan    NSTEMI - Initial EKG showed slight ST elevation in inferior leads but did not meet criteria for STEMI.  - High-sensitivity troponin elevated at 223 >> 958 >> 1,460 >> 1,633. - Echo pending.  - No chest pain this morning. - Continue IV Heparin.  - Continue aspirin, beta-blocker, and Zetia (intolerant to statins). - Continued to have long discussion with two daughters about cardiac catheterization vs medical treatment. One daughter wants to proceed with cardiac catheterization and the other seems to be on the fence. Patient did have worsening confusion overnight and has been pulling on leads and IV but seems calmer this morning. Discussed my concern about patient being cooperative  on the table. Will discuss with MD.   Hypertension - BP currently well controlled. - Continue current Coreg, Losartan, and Amlodipine.  Hyperlipidemia - Lipid panel this admission: Total Cholesterol 234, Triglycerides 220, HDL 52, LDL 138.  - Intolerant to statins in the past.  - Started on Zetia 10mg  daily this admission. Can consider referral to lipid clinic as an outpatient. - Will need repeat lipid panel and LFTs in 6-8 weeks.  Bilateral Carotid Artery Disease - Most recent carotid dopplers in 06/2018 showed 60-79% stenosis of right ICA and 1-39% stenosis of left ICA. - Continue Aspirin.  - Intolerant to statins in the past. Started on Zetia 10mg  daily.   For questions or updates, please contact [place] Please consult [email or web address] for contact info under        Signed, Darreld Mclean, PA-C  09/05/2020, 6:50 AM

## 2020-09-05 NOTE — ED Notes (Signed)
Pt currently eating, took off pulse ox. Will check SpO2 once she has finished.

## 2020-09-05 NOTE — Progress Notes (Signed)
ANTICOAGULATION CONSULT NOTE - Follow Up Consult  Pharmacy Consult for IV Heparin Indication: chest pain/ACS  No Known Allergies  Vital Signs: Temp: 97.9 F (36.6 C) (11/08 2131) Temp Source: Oral (11/08 2131) BP: 144/96 (11/09 0145) Pulse Rate: 67 (11/09 0145)  Labs: Recent Labs    09/04/20 0918 09/04/20 0918 09/04/20 0923 09/04/20 1002 09/04/20 1137 09/04/20 1628 09/04/20 1904 09/04/20 1912 09/05/20 0312  HGB 13.5  --   --   --   --   --   --   --  12.0  HCT 41.8  --   --   --   --   --   --   --  37.0  PLT 205  --   --   --   --   --   --   --  190  APTT  --   --  24  --   --   --   --   --   --   LABPROT  --   --  13.3  --   --   --   --   --   --   INR  --   --  1.1  --   --   --   --   --   --   HEPARINUNFRC  --   --   --  <0.10*  --   --   --  0.26* 0.25*  CREATININE 0.94  --   --   --   --   --   --   --   --   TROPONINIHS 223*   < >  --   --  958* 1,460* 1,633*  --   --    < > = values in this interval not displayed.    Estimated Creatinine Clearance: 40.2 mL/min (by C-G formula based on SCr of 0.94 mg/dL).  Patient Measurements: Total Body Weight: 70.3 kg  Height: 62 inches Heparin Dosing Weight: 65 kg  Medical History: Past Medical History:  Diagnosis Date  . Cataract   . Gout   . Hyperlipidemia   . Hypertension   . Memory loss     Assessment: 86 yr old female presented with chest pressure as code STEM. Pharmacy was consulted to dose IV heparin; pt was not on anticoagulation PTA. Pt rec'd heparin 4000 units IV bolus X 1 in ED prior to pharmacy consult.  Initial heparin level ~9 hrs after initiation of heparin infusion at 850 units/hr was 0.26 units/ml, which is below the goal range for this pt. H/H, platelets WNL. Per ED RN, no issues with IV or bleeding observed.  Per Cardiology, pt did not meet criteria for STEMI; cardiac cath planned for 11/9 late AM.  11/9 AM update:  Heparin level sub-therapeutic   Goal of Therapy:  Heparin level  0.3-0.7 units/ml Monitor platelets by anticoagulation protocol: Yes   Plan:  Increase heparin infusion to 1050 units/hr Check heparin level in 8 hrs Monitor daily heparin level, CBC Monitor for signs/symptoms of bleeding  Narda Bonds, PharmD, BCPS Clinical Pharmacist Phone: [phone]

## 2020-09-05 NOTE — Progress Notes (Signed)
ANTICOAGULATION CONSULT NOTE - Follow Up Consult  Pharmacy Consult for IV Heparin Indication: chest pain/ACS  No Known Allergies  Vital Signs: Temp: 97.4 F (36.3 C) (11/09 1342) Temp Source: Axillary (11/09 1342) BP: 145/73 (11/09 1342) Pulse Rate: 66 (11/09 1342)  Labs: Recent Labs    09/04/20 0918 09/04/20 0918 09/04/20 0923 09/04/20 1002 09/04/20 1628 09/04/20 1904 09/04/20 1912 09/05/20 0312 09/05/20 0451 09/05/20 0855 09/05/20 1314  HGB 13.5   < >  --   --   --   --   --  12.0 12.3  --   --   HCT 41.8  --   --   --   --   --   --  37.0 37.8  --   --   PLT 205  --   --   --   --   --   --  190 192  --   --   APTT  --   --  24  --   --   --   --   --   --   --   --   LABPROT  --   --  13.3  --   --   --   --   --   --   --   --   INR  --   --  1.1  --   --   --   --   --   --   --   --   HEPARINUNFRC  --   --   --    < >  --   --  0.26* 0.25*  --   --  0.52  CREATININE 0.94  --   --   --   --   --   --   --  1.00  --   --   TROPONINIHS 223*  --   --    < > 1,460* 1,633*  --   --   --  2,123*  --    < > = values in this interval not displayed.    Estimated Creatinine Clearance: 37.8 mL/min (by C-G formula based on SCr of 1 mg/dL).  Patient Measurements: Total Body Weight: 70.3 kg  Height: 62 inches Heparin Dosing Weight: 65 kg  Medical History: Past Medical History:  Diagnosis Date  . Cataract   . Gout   . Hyperlipidemia   . Hypertension   . Memory loss     Assessment: 86 yr old female presented with chest pressure as code STEM. Pharmacy was consulted to dose IV heparin; pt was not on anticoagulation PTA. Pt rec'd heparin 4000 units IV bolus X 1 in ED prior to pharmacy consult.  Heparin level today came back therapeutic at 0.52, on 1050 units/hr. Hgb 12.3, plt 192. Trop 631>4970. No s/sx of bleeding or infusion issues.   Goal of Therapy:  Heparin level 0.3-0.7 units/ml Monitor platelets by anticoagulation protocol: Yes   Plan:  Continue heparin  infusion to 1050 units/hr Monitor daily heparin level, CBC Monitor for signs/symptoms of bleeding  Antonietta Jewel, PharmD, [place] Clinical Pharmacist  Phone: [phone] 09/05/2020 2:08 PM  Please check AMION for all [place] phone numbers After 10:00 PM, call [place] [phone]

## 2020-09-05 NOTE — ED Notes (Signed)
Report called to 6E RN

## 2020-09-05 NOTE — Plan of Care (Signed)

## 2020-09-06 ENCOUNTER — Inpatient Hospital Stay (HOSPITAL_COMMUNITY): Payer: Medicare Other

## 2020-09-06 DIAGNOSIS — I214 Non-ST elevation (NSTEMI) myocardial infarction: Secondary | ICD-10-CM | POA: Diagnosis not present

## 2020-09-06 DIAGNOSIS — R001 Bradycardia, unspecified: Secondary | ICD-10-CM

## 2020-09-06 DIAGNOSIS — I959 Hypotension, unspecified: Secondary | ICD-10-CM

## 2020-09-06 LAB — BASIC METABOLIC PANEL
Anion gap: 10 (ref 5–15)
BUN: 18 mg/dL (ref 8–23)
CO2: 23 mmol/L (ref 22–32)
Calcium: 9.6 mg/dL (ref 8.9–10.3)
Chloride: 106 mmol/L (ref 98–111)
Creatinine, Ser: 0.96 mg/dL (ref 0.44–1.00)
GFR, Estimated: 58 mL/min — ABNORMAL LOW (ref >60)
Glucose, Bld: 112 mg/dL — ABNORMAL HIGH (ref 70–99)
Potassium: 3.7 mmol/L (ref 3.5–5.1)
Sodium: 139 mmol/L (ref 135–145)

## 2020-09-06 LAB — CBC
HCT: 37 % (ref 36.0–46.0)
Hemoglobin: 12.3 g/dL (ref 12.0–15.0)
MCH: 30.6 pg (ref 26.0–34.0)
MCHC: 33.2 g/dL (ref 30.0–36.0)
MCV: 92 fL (ref 80.0–100.0)
Platelets: 193 10*3/uL (ref 150–400)
RBC: 4.02 MIL/uL (ref 3.87–5.11)
RDW: 13.7 % (ref 11.5–15.5)
WBC: 9.1 10*3/uL (ref 4.0–10.5)
nRBC: 0 % (ref 0.0–0.2)

## 2020-09-06 LAB — LACTIC ACID, PLASMA
Lactic Acid, Venous: 2.5 mmol/L (ref 0.5–1.9)
Lactic Acid, Venous: 1.8 mmol/L (ref 0.5–1.9)

## 2020-09-06 LAB — ECHOCARDIOGRAM LIMITED
Area-P 1/2: 2.87 cm2
Calc EF: 56.2 %
S' Lateral: 3.4 cm
Single Plane A2C EF: 51.7 %
Single Plane A4C EF: 60.2 %
Weight: 2479.73 oz

## 2020-09-06 LAB — MAGNESIUM: Magnesium: 1.9 mg/dL (ref 1.7–2.4)

## 2020-09-06 LAB — HEPARIN LEVEL (UNFRACTIONATED): Heparin Unfractionated: 0.59 IU/mL (ref 0.30–0.70)

## 2020-09-06 MED ORDER — LOSARTAN POTASSIUM 25 MG PO TABS: 25.0000 mg | ORAL_TABLET | Freq: Every day | ORAL | Status: DC

## 2020-09-06 MED ORDER — ATROPINE SULFATE 1 MG/10ML IJ SOSY
PREFILLED_SYRINGE | INTRAMUSCULAR | Status: AC
  Filled 2020-09-06: qty 10

## 2020-09-06 MED ORDER — CARVEDILOL 3.125 MG PO TABS
3.1250 mg | ORAL_TABLET | Freq: Two times a day (BID) | ORAL | Status: DC
  Filled 2020-09-06: qty 1

## 2020-09-06 MED ORDER — LOSARTAN POTASSIUM 50 MG PO TABS: 50.0000 mg | ORAL_TABLET | Freq: Every day | ORAL | Status: DC

## 2020-09-06 NOTE — Evaluation (Signed)
Physical Therapy Evaluation Patient Details Name: Katelyn Powers MRN: [id number] DOB: 04/30/1932 Today's Date: 09/06/2020   History of Present Illness  Pt is a 86 y.o. F with significant PMH of mild aortic stenosis, bilateral carotid artery disease, dementia, HTN, who presents for NSTEMI. Plan for medical management.  Clinical Impression  Prior to admission, pt lives alone and is independent; has history of mild dementia. Pt presents with decreased functional mobility secondary to generalized weakness, balance impairments, decreased endurance, and cognitive impairments. Pt not oriented to month, with seemingly decreased awareness of safety/deficits which is an acute change from baseline. Pt requiring min assist for functional mobility. Ambulating 120 feet with a walker; HR stable 70's, SpO2 > 90% on RA. Pt denying chest pain or dyspnea on exertion. Recommended 24/7 assist and use of gait belt and guarding during all mobility. Provided gait belt to pt daughter.   Of note, pt with change in medical status and rapid response post session. Will need reassessment for d/c planning.    Follow Up Recommendations Home health PT;Supervision/Assistance - 24 hour (pt daughter refusing SNF currently)    Equipment Recommendations  Rolling walker with 5" wheels;3in1 (PT)    Recommendations for Other Services       Precautions / Restrictions Precautions Precautions: Fall Restrictions Weight Bearing Restrictions: No      Mobility  Bed Mobility Overal bed mobility: Needs Assistance Bed Mobility: Supine to Sit     Supine to sit: Min assist     General bed mobility comments: MinA for trunk to upright    Transfers Overall transfer level: Needs assistance Equipment used: Rolling walker (2 wheeled) Transfers: Sit to/from Stand Sit to Stand: Min assist;Min guard         General transfer comment: MinA to rise from edge of bed, min guard to rise from toilet  height  Ambulation/Gait Ambulation/Gait assistance: Min assist Gait Distance (Feet): 180 Feet Assistive device: Rolling walker (2 wheeled) Gait Pattern/deviations: Step-through pattern;Decreased stride length Gait velocity: decreased   General Gait Details: Cues for walker proximity and management, minA for stability particularly with direction change i.e. backwards and over unlevel surfaces such as thresholds.   Stairs            Wheelchair Mobility    Modified Rankin (Stroke Patients Only)       Balance Overall balance assessment: Needs assistance Sitting-balance support: Feet supported Sitting balance-Leahy Scale: Fair     Standing balance support: Bilateral upper extremity supported Standing balance-Leahy Scale: Poor                               Pertinent Vitals/Pain Pain Assessment: No/denies pain    Home Living Family/patient expects to be discharged to:: Private residence Living Arrangements: Alone Available Help at Discharge: Family;Available PRN/intermittently (two daughters) Type of Home: House Home Access: Stairs to enter   Technical brewer of Steps: 2 Home Layout: One level Home Equipment: None      Prior Function Level of Independence: Needs assistance   Gait / Transfers Assistance Needed: No falls, independent, enjoys yard work, drives  ADL's / Land Needed: Independent ADL's/IADL's, Automatic dispenser for medication        Hand Dominance        Extremity/Trunk Assessment   Upper Extremity Assessment Upper Extremity Assessment: Generalized weakness    Lower Extremity Assessment Lower Extremity Assessment: Generalized weakness    Cervical / Trunk Assessment Cervical / Trunk Assessment:  Kyphotic  Communication   Communication: No difficulties  Cognition Arousal/Alertness: Awake/alert Behavior During Therapy: WFL for tasks assessed/performed Overall Cognitive Status: Impaired/Different from  baseline Area of Impairment: Orientation;Memory;Following commands;Awareness;Safety/judgement;Problem solving                 Orientation Level: Disoriented to;Time (month)   Memory: Decreased short-term memory Following Commands: Follows one step commands with increased time;Follows multi-step commands inconsistently Safety/Judgement: Decreased awareness of safety;Decreased awareness of deficits Awareness: Intellectual Problem Solving: Difficulty sequencing;Requires verbal cues General Comments: Pt with history of mild dementia at baseline however lives alone and is independent with IADL's. Pt daughter reports she has gotten lost driving in the rain recently. Pt presents with acute confusion, not oriented to month, decreased awareness of safety/deficits, follows multi step commands inconsistently.      General Comments      Exercises     Assessment/Plan    PT Assessment Patient needs continued PT services  PT Problem List Decreased strength;Decreased activity tolerance;Decreased balance;Decreased mobility;Decreased cognition;Decreased safety awareness       PT Treatment Interventions DME instruction;Gait training;Functional mobility training;Therapeutic activities;Therapeutic exercise;Balance training;Patient/family education    PT Goals (Current goals can be found in the Care Plan section)  Acute Rehab PT Goals Patient Stated Goal: pt daughter would like her to return home PT Goal Formulation: With patient/family Time For Goal Achievement: 09/20/20 Potential to Achieve Goals: Fair    Frequency Min 3X/week   Barriers to discharge        Co-evaluation               AM-PAC PT "6 Clicks" Mobility  Outcome Measure Help needed turning from your back to your side while in a flat bed without using bedrails?: None Help needed moving from lying on your back to sitting on the side of a flat bed without using bedrails?: A Little Help needed moving to and from a bed  to a chair (including a wheelchair)?: A Little Help needed standing up from a chair using your arms (e.g., wheelchair or bedside chair)?: A Little Help needed to walk in hospital room?: A Little Help needed climbing 3-5 steps with a railing? : A Lot 6 Click Score: 18    End of Session Equipment Utilized During Treatment: Gait belt Activity Tolerance: Patient tolerated treatment well Patient left: in chair;with call bell/phone within reach;with chair alarm set;with family/visitor present Nurse Communication: Mobility status PT Visit Diagnosis: Unsteadiness on feet (R26.81);Muscle weakness (generalized) (M62.81);Difficulty in walking, not elsewhere classified (R26.2)    Time: 6213-0865 PT Time Calculation (min) (ACUTE ONLY): 43 min   Charges:   PT Evaluation $PT Eval Moderate Complexity: 1 Mod PT Treatments $Gait Training: 8-22 mins $Therapeutic Activity: 8-22 mins        Wyona Almas, PT, DPT Acute Rehabilitation Services Pager [phone] Office [phone]   Deno Etienne 09/06/2020, 10:17 AM

## 2020-09-06 NOTE — Progress Notes (Signed)
  Echocardiogram 2D Echocardiogram has been performed.  [name] 09/06/2020, 10:37 AM

## 2020-09-06 NOTE — Progress Notes (Signed)
CRITICAL VALUE ALERT  Critical Value: Lactic acid 2.5  Date & Time Notied:  09/06/20 1310  Provider Notified: Sarajane Jews, [place]  Orders Received/Actions taken: Awaiting new orders

## 2020-09-06 NOTE — Telephone Encounter (Signed)
Reviewed chart and looks like there was concern for too much BP medication today.  Medications have been adjusted.  Called daughter and left message letting her know that the better person to talk to would be someone from the Cardiology team at the hospital.

## 2020-09-06 NOTE — Telephone Encounter (Signed)
   Katelyn Powers is calling back, she said she needs to speak with Anderson Malta again. She the doctor gave wrong dosage of medication and pt almost died. She said she is in the hospital right now and if Anderson Malta can call her back to discuss pt's medications

## 2020-09-06 NOTE — Progress Notes (Addendum)
Progress Note  Patient Name: Katelyn Powers Date of Encounter: 09/06/2020  [facility] Cardiologist: Belva Crome III, MD   Subjective   No acute overnight events. She had more confusion last night but did sleep better. Daughter describes one event last night where it almost looked like she was choking and then she had "brown" phlegm on the side of her mouth. She had not eaten prior to this event but daughter was wondering if she could have aspirated. O2 sats normal. Afebrile. No chest pain or shortness of breath. Daughter does feel like she is more weak than usual after being in bed for the last couple of days.  Inpatient Medications    Scheduled Meds: . amLODipine  10 mg Oral Daily  . aspirin EC  81 mg Oral Daily  . carvedilol  25 mg Oral BID WC  . clopidogrel  75 mg Oral Daily  . ezetimibe  10 mg Oral Daily  . losartan  100 mg Oral Daily   Continuous Infusions: . sodium chloride 10 mL/hr at 09/06/20 0421  . heparin 1,050 Units/hr (09/06/20 0421)   PRN Meds: acetaminophen, LORazepam, melatonin, nitroGLYCERIN, ondansetron (ZOFRAN) IV   Vital Signs    Vitals:   09/05/20 1402 09/05/20 1953 09/06/20 0048 09/06/20 0353  BP: 124/69 137/75 (!) 126/55 (!) 140/115  Pulse:  70 63 64  Resp: 15 16 16    Temp: 98.6 F (37 C) 97.8 F (36.6 C) 98.2 F (36.8 C) 98.5 F (36.9 C)  TempSrc: Oral Oral Axillary Axillary  SpO2:  96% 94% 99%  Weight:        Intake/Output Summary (Last 24 hours) at 09/06/2020 0716 Last data filed at 09/06/2020 0500 Gross per 24 hour  Intake 973.95 ml  Output 1250 ml  Net -276.05 ml   Last 3 Weights 09/04/2020 05/10/2020 02/22/2020  Weight (lbs) 154 lb 15.7 oz 145 lb 153 lb 8 oz  Weight (kg) 70.3 kg 65.772 kg 69.627 kg      Telemetry     Normal sinus rhythm with rates in the 50's to 70's. - Personally Reviewed  ECG    No new ECG tracing today. - Personally Reviewed  Physical Exam   GEN: No acute distress.   Neck: Supple. Cardiac: RRR.  II/VI systolic murmur. No rubs or gallops.  Respiratory: Clear to auscultation bilaterally. Mild decreased breath sounds in right lower base. No significant crackles,  GI: Soft, non-tender, non-distended. Bowel sounds present. MS: No lower extremity edema. No deformity. Skin: Warm and dry. Neuro:  No focal deficits. Psych: Normal affect. Responds appropriately.  Labs    High Sensitivity Troponin:   Recent Labs  Lab 09/04/20 1137 09/04/20 1628 09/04/20 1904 09/05/20 0855 09/05/20 1314  TROPONINIHS 958* 1,460* 1,633* 2,123* 1,807*      Chemistry Recent Labs  Lab 09/04/20 0918 09/05/20 0451  NA 138 140  K 3.7 3.7  CL 103 106  CO2 21* 24  GLUCOSE 137* 106*  BUN 19 20  CREATININE 0.94 1.00  CALCIUM 9.8 9.5  GFRNONAA 59* 55*  ANIONGAP 14 10     Hematology Recent Labs  Lab 09/04/20 0918 09/05/20 0312 09/05/20 0451  WBC 9.7 8.6 8.4  RBC 4.44 3.94 4.02  HGB 13.5 12.0 12.3  HCT 41.8 37.0 37.8  MCV 94.1 93.9 94.0  MCH 30.4 30.5 30.6  MCHC 32.3 32.4 32.5  RDW 13.9 14.0 14.0  PLT 205 190 192    BNPNo results for input(s): BNP, PROBNP in the  last 168 hours.   DDimer No results for input(s): DDIMER in the last 168 hours.   Radiology    DG Chest 2 View  Result Date: 09/04/2020 CLINICAL DATA:  Code STEMI.  Chest pressure. EXAM: CHEST - 2 VIEW COMPARISON:  Mar 07, 2018. FINDINGS: The heart size and mediastinal contours are within normal limits. Aortic atherosclerosis. No consolidation. No visible pleural effusions or pneumothorax. Right axillary clips. Remote right rib fractures. IMPRESSION: No acute cardiopulmonary disease. Electronically Signed   By: Margaretha Sheffield MD   On: 09/04/2020 09:56   ECHOCARDIOGRAM COMPLETE  Result Date: 09/05/2020    ECHOCARDIOGRAM REPORT   Patient Name:   Katelyn Powers Date of Exam: 09/05/2020 Medical Rec #:  [id number]   Height:       62.0 in Accession #:    [id number]  Weight:       155.0 lb Date of Birth:  Mar 16, 1932  BSA:           1.715 m Patient Age:    86 years    BP:           145/105 mmHg Patient Gender: F           HR:           65 bpm. Exam Location:  Inpatient Procedure: 2D Echo Indications:    NSTEMI I21.4  History:        Patient has prior history of Echocardiogram examinations, most                 recent 06/03/2018. Risk Factors:Hypertension and Dyslipidemia.  Sonographer:    Mikki Santee RDCS (AE) Referring Phys: [place]  1. Left ventricular ejection fraction, by estimation, is 50 to 55%. The left ventricle has low normal function. The left ventricle demonstrates regional wall motion abnormalities (see scoring diagram/findings for description). Left ventricular diastolic  parameters are consistent with Grade I diastolic dysfunction (impaired relaxation). Elevated left atrial pressure.  2. Right ventricular systolic function is normal. The right ventricular size is normal. Tricuspid regurgitation signal is inadequate for assessing PA pressure.  3. Left atrial size was mildly dilated.  4. The mitral valve is normal in structure. Trivial mitral valve regurgitation.  5. The aortic valve is tricuspid. There is moderate calcification of the aortic valve. There is moderate thickening of the aortic valve. Aortic valve regurgitation is mild.  6. The inferior vena cava is normal in size with greater than 50% respiratory variability, suggesting right atrial pressure of 3 mmHg. Comparison(s): Prior images reviewed side by side. The left ventricular function is worsened. The left ventricular wall motion abnormality is new. FINDINGS  Left Ventricle: Left ventricular ejection fraction, by estimation, is 50 to 55%. The left ventricle has low normal function. The left ventricle demonstrates regional wall motion abnormalities. The left ventricular internal cavity size was normal in size. There is no left ventricular hypertrophy. Left ventricular diastolic parameters are consistent with Grade I diastolic dysfunction  (impaired relaxation). Elevated left atrial pressure.  LV Wall Scoring: The apical inferior segment is hypokinetic. Right Ventricle: The right ventricular size is normal. No increase in right ventricular wall thickness. Right ventricular systolic function is normal. Tricuspid regurgitation signal is inadequate for assessing PA pressure. Left Atrium: Left atrial size was mildly dilated. Right Atrium: Right atrial size was normal in size. Pericardium: There is no evidence of pericardial effusion. Mitral Valve: The mitral valve is normal in structure. There is mild thickening of the mitral valve  leaflet(s). Trivial mitral valve regurgitation. Tricuspid Valve: The tricuspid valve is normal in structure. Tricuspid valve regurgitation is not demonstrated. Aortic Valve: The aortic valve is tricuspid. There is moderate calcification of the aortic valve. There is moderate thickening of the aortic valve. Aortic valve regurgitation is mild. Aortic regurgitation PHT measures 476 msec. Pulmonic Valve: The pulmonic valve was grossly normal. Pulmonic valve regurgitation is trivial. Aorta: The aortic root is normal in size and structure. Venous: The inferior vena cava is normal in size with greater than 50% respiratory variability, suggesting right atrial pressure of 3 mmHg. IAS/Shunts: No atrial level shunt detected by color flow Doppler.  LEFT VENTRICLE PLAX 2D LVIDd:         4.10 cm  Diastology LVIDs:         2.60 cm  LV e' medial:    2.68 cm/s LV PW:         1.00 cm  LV E/e' medial:  34.9 LV IVS:        1.10 cm  LV e' lateral:   3.57 cm/s LVOT diam:     2.00 cm  LV E/e' lateral: 26.2 LV SV:         60 LV SV Index:   35 LVOT Area:     3.14 cm  RIGHT VENTRICLE RV S prime:     12.10 cm/s TAPSE (M-mode): 1.0 cm LEFT ATRIUM             Index       RIGHT ATRIUM          Index LA diam:        3.10 cm 1.81 cm/m  RA Area:     8.19 cm LA Vol (A2C):   59.3 ml 34.57 ml/m RA Volume:   14.20 ml 8.28 ml/m LA Vol (A4C):   49.1 ml 28.62  ml/m LA Biplane Vol: 53.8 ml 31.36 ml/m  AORTIC VALVE LVOT Vmax:   83.50 cm/s LVOT Vmean:  60.100 cm/s LVOT VTI:    0.190 m AI PHT:      476 msec  AORTA Ao Root diam: 3.20 cm MITRAL VALVE MV Area (PHT): 2.95 cm     SHUNTS MV Decel Time: 257 msec     Systemic VTI:  0.19 m MV E velocity: 93.40 cm/s   Systemic Diam: 2.00 cm MV A velocity: 146.00 cm/s MV E/A ratio:  0.64 Mihai Croitoru MD Electronically signed by Sanda Klein MD Signature Date/Time: 09/05/2020/10:34:17 AM    Final     Cardiac Studies   Echocardiogram 09/05/2020: Impressions: 1. Left ventricular ejection fraction, by estimation, is 50 to 55%. The  left ventricle has low normal function. The left ventricle demonstrates  regional wall motion abnormalities (see scoring diagram/findings for  description). Left ventricular diastolic  parameters are consistent with Grade I diastolic dysfunction (impaired  relaxation). Elevated left atrial pressure.  2. Right ventricular systolic function is normal. The right ventricular  size is normal. Tricuspid regurgitation signal is inadequate for assessing  PA pressure.  3. Left atrial size was mildly dilated.  4. The mitral valve is normal in structure. Trivial mitral valve  regurgitation.  5. The aortic valve is tricuspid. There is moderate calcification of the  aortic valve. There is moderate thickening of the aortic valve. Aortic  valve regurgitation is mild.  6. The inferior vena cava is normal in size with greater than 50%  respiratory variability, suggesting right atrial pressure of 3 mmHg.   Comparison(s): Prior images reviewed side  by side. The left ventricular  function is worsened. The left ventricular wall motion abnormality is new.   Patient Profile     86 y.o. female with a history of mild aortic stenosis, bilateral carotid artery disease, hypertension, hyperlipidemia, and dementia who was admitted with NSTEMI on 09/04/2020. Decision made to treat medically given  patient's age and underlying dementia.  Assessment & Plan    NSTEMI - Initial EKG showed slight ST elevation in inferior leads but did not meet criteria for STEMI.  - High-sensitivity troponin peaked at 2,123. - Echo showed LVEF of 50-55% with hypokinesis of apical inferior segment and grade 1 diastolic dysfunction. No significant valvular disease. - No recurrent angina - Treating medically given age and dementia. Continue IV Heparin for 48 hours (today at 10:20am) - discussed this with Pharmacy. Continue DAPT with Aspirin and Plavix. Continue beta-blocker, and Zetia (intolerant to statins).  Hypertension - BP somewhat labile but wondering if all readings are accurate. Most recent BP 148/73. - Continue current Amlodipine 10mg  daily, Coreg 25mg  twice daily, and Losartan 100mg  daily.  - Asked patient/daughter to keep BP/heart rate log and bring to follow-up visit.  Hyperlipidemia - Lipid panel this admission: Total Cholesterol 234, Triglycerides 220, HDL 52, LDL 138.  - Intolerant to statins in the past.  - Started on Zetia 10mg  daily this admission. Can consider referral to lipid clinic as an outpatient. - Will need repeat lipid panel and LFTs in 6-8 weeks.  Bilateral Carotid Artery Disease - Most recent carotid dopplers in 06/2018 showed 60-79% stenosis of right ICA and 1-39% stenosis of left ICA. - Continue Aspirin.  - Intolerant to statins in the past. Started on Zetia 10mg  daily.   Dementia - Patient has had worsening confusion over the last 2 nights - suspect sundowning.  - Patient lives alone but both daughters are close by. She still drives.  - Discussed concerns of patient living alone with activities like cooking and driving. I recommended someone be with her for the next several days at least until she is back to baseline. However, I also recommended discussing with patient's PCP about recommendations as dementia advances.  Disposition: Plan will be to get patient up and  ambulate her today. Will also have Cardiac Rehab, PT, and OT come and see patient. If she is able to ambulate without any problems, may be able to go home later today. MD to follow with additional recommendations.  For questions or updates, please contact [place] Please consult [email or web address] for contact info under        Signed, Darreld Mclean, PA-C  09/06/2020, 7:16 AM    Addendum:  Patient became bradycardic, hypotensive, and non-responsive. Heart rates in the 30's to 40's. Systolic BP as low as the 70's (MAP 53). Patient was given 1L NaCl bolus and BP improved. Heart rates still in the 40's to 50's. Patient now fully responsive.   Plan: - Will check labs: Lactic acid, Magnesium - Will check STAT EKG. - Will check STAT Echo. - Will stop Amlodipine and decrease Coreg to 3.125mg  twice daily and Losartan to 50mg  daily.   Darreld Mclean, PA-C 09/06/2020 10:08 AM

## 2020-09-06 NOTE — Plan of Care (Signed)

## 2020-09-06 NOTE — Progress Notes (Signed)
ANTICOAGULATION CONSULT NOTE - Follow Up Consult  Pharmacy Consult for IV Heparin Indication: chest pain/ACS  No Known Allergies  Vital Signs: Temp: 98.5 F (36.9 C) (11/10 0353) Temp Source: Axillary (11/10 0353) BP: 140/115 (11/10 0353) Pulse Rate: 64 (11/10 0800)  Labs: Recent Labs    09/04/20 0918 09/04/20 0923 09/04/20 1002 09/04/20 1904 09/04/20 1912 09/05/20 0312 09/05/20 0312 09/05/20 0451 09/05/20 0855 09/05/20 1314 09/06/20 0713 09/06/20 0719  HGB 13.5  --    < >  --   --  12.0   < > 12.3  --   --  12.3  --   HCT 41.8  --    < >  --   --  37.0  --  37.8  --   --  37.0  --   PLT 205  --    < >  --   --  190  --  192  --   --  193  --   APTT  --  24  --   --   --   --   --   --   --   --   --   --   LABPROT  --  13.3  --   --   --   --   --   --   --   --   --   --   INR  --  1.1  --   --   --   --   --   --   --   --   --   --   HEPARINUNFRC  --   --    < >  --    < > 0.25*  --   --   --  0.52 0.59  --   CREATININE 0.94  --   --   --   --   --   --  1.00  --   --   --  0.96  TROPONINIHS 223*  --    < > 1,633*  --   --   --   --  2,123* 1,807*  --   --    < > = values in this interval not displayed.    Estimated Creatinine Clearance: 39.4 mL/min (by C-G formula based on SCr of 0.96 mg/dL).  Patient Measurements: Total Body Weight: 70.3 kg  Height: 62 inches Heparin Dosing Weight: 65 kg  Medical History: Past Medical History:  Diagnosis Date  . Cataract   . Gout   . Hyperlipidemia   . Hypertension   . Memory loss   . NSTEMI (non-ST elevated myocardial infarction) ([place]) 08/2020    Assessment: 86 yr old female presented with chest pressure as code STEM. Pharmacy was consulted to dose IV heparin; pt was not on anticoagulation PTA. Pt rec'd heparin 4000 units IV bolus X 1 in ED prior to pharmacy consult.  Heparin level today came back therapeutic at 0.59, on 1050 units/hr. Hgb 12.3, plt 193. Trop [phone]. No s/sx of bleeding or infusion issues.    Goal of Therapy:  Heparin level 0.3-0.7 units/ml Monitor platelets by anticoagulation protocol: Yes   Plan:  Continue heparin infusion to 1050 units/hr - plan for medical management with 48 hr of heparin, ending today  Monitor daily heparin level, CBC Monitor for signs/symptoms of bleeding  Antonietta Jewel, PharmD, [place] Clinical Pharmacist  Phone: [phone] 09/06/2020 8:05 AM  Please check AMION for all [place] phone numbers After  10:00 PM, call [place] [phone]

## 2020-09-07 LAB — CBC
HCT: 36.7 % (ref 36.0–46.0)
Hemoglobin: 12.2 g/dL (ref 12.0–15.0)
MCH: 30.7 pg (ref 26.0–34.0)
MCHC: 33.2 g/dL (ref 30.0–36.0)
MCV: 92.4 fL (ref 80.0–100.0)
Platelets: 186 10*3/uL (ref 150–400)
RBC: 3.97 MIL/uL (ref 3.87–5.11)
RDW: 14 % (ref 11.5–15.5)
WBC: 9.3 10*3/uL (ref 4.0–10.5)
nRBC: 0 % (ref 0.0–0.2)

## 2020-09-07 MED ORDER — LOSARTAN POTASSIUM 50 MG PO TABS
50.0000 mg | ORAL_TABLET | Freq: Every day | ORAL | Status: DC
  Administered 2020-09-07: 50 mg via ORAL
  Filled 2020-09-07: qty 1

## 2020-09-07 MED ORDER — CLOPIDOGREL BISULFATE 75 MG PO TABS: 75.0000 mg | ORAL_TABLET | Freq: Every day | ORAL | 3 refills | Status: DC

## 2020-09-07 MED ORDER — AMLODIPINE BESYLATE 5 MG PO TABS: 5.0000 mg | ORAL_TABLET | Freq: Every day | ORAL | Status: AC

## 2020-09-07 MED ORDER — LOSARTAN POTASSIUM 50 MG PO TABS: 50.0000 mg | ORAL_TABLET | Freq: Every day | ORAL | Status: DC

## 2020-09-07 MED ORDER — EZETIMIBE 10 MG PO TABS: 10.0000 mg | ORAL_TABLET | Freq: Every day | ORAL | 0 refills | Status: DC

## 2020-09-07 MED ORDER — AMLODIPINE BESYLATE 5 MG PO TABS
5.0000 mg | ORAL_TABLET | Freq: Every day | ORAL | Status: DC
  Administered 2020-09-07: 5 mg via ORAL
  Filled 2020-09-07: qty 1

## 2020-09-07 MED ORDER — NITROGLYCERIN 0.4 MG SL SUBL: 0.4000 mg | SUBLINGUAL_TABLET | SUBLINGUAL | 2 refills | Status: AC | PRN

## 2020-09-07 MED ORDER — CLOPIDOGREL BISULFATE 75 MG PO TABS: 75.0000 mg | ORAL_TABLET | Freq: Every day | ORAL | 0 refills | Status: DC

## 2020-09-07 MED ORDER — EZETIMIBE 10 MG PO TABS: 10.0000 mg | ORAL_TABLET | Freq: Every day | ORAL | 3 refills | Status: DC

## 2020-09-07 NOTE — Discharge Instructions (Signed)
Post NSTEMI: NO HEAVY LIFTING X 2 WEEKS. NO SEXUAL ACTIVITY X 2 WEEKS. NO SOAKING BATHS, HOT TUBS, POOLS, ETC., X 7 DAYS.   Information about your medication: Plavix (anti-platelet agent)  Generic Name (Brand): clopidogrel (Plavix), once daily medication  PURPOSE: You are taking this medication along with aspirin to lower your chance of having a heart attack, stroke, or blood clots in your heart stent. These can be fatal. Plavix and aspirin help prevent platelets from sticking together and forming a clot that can block an artery or your stent.   Common SIDE EFFECTS you may experience include: bruising or bleeding more easily, shortness of breath  Do not stop taking PLAVIX without talking to the doctor who prescribes it for you. People who are treated with a stent and stop taking Plavix too soon, have a higher risk of getting a blood clot in the stent, having a heart attack, or dying. If you stop Plavix because of bleeding, or for other reasons, your risk of a heart attack or stroke may increase.   Avoid taking NSAID agents or anti-inflammatory medications such as ibuprofen, naproxen given increased bleed risk with plavix - can use acetaminophen (Tylenol) if needed for pain.  Avoid taking over the counter stomach medications omeprazole (Prilosec) or esomeprazole (Nexium) since these do interact and make plavix less effective - ask your pharmacist or doctor for alterative agents if needed for heartburn or GERD.   Tell all of your doctors and dentists that you are taking Plavix. They should talk to the doctor who prescribed Plavix for you before you have any surgery or invasive procedure.   Contact your health care provider if you experience: severe or uncontrollable bleeding, pink/red/brown urine, vomiting blood or vomit that looks like "coffee grounds", red or black stools (looks like tar), coughing up blood or blood  clots ----------------------------------------------------------------------------------------------------------------------

## 2020-09-07 NOTE — Evaluation (Signed)
Occupational Therapy Evaluation Patient Details Name: Katelyn Powers MRN: [id number] DOB: 16-Aug-1932 Today's Date: 09/07/2020    History of Present Illness Pt is a 86 y.o. F with significant PMH of mild aortic stenosis, bilateral carotid artery disease, dementia, HTN, who presents for NSTEMI. Plan for medical management.   Clinical Impression   PTA pt living alone and able to complete ADL/IADL independently. Two daughters live nearby and check on pt daily. At time of eval, pt presents with ability to complete bed mobility and sit <> stands at min guard level with RW. Pt was able to complete functional mobility to sink and perform x2 grooming tasks. She then mobilized into bathroom for toilet transfer at min guard level with RW. Pt required cues for sequencing throughout ADL tasks. Also noted further cognitive deficits in orientation and STM (previous history of mild dementia). Given current status, recommend HHOT to support safety, BADL engagement, and independent PLOF. OT will continue to follow per POC listed below.    Follow Up Recommendations  Home health OT;Supervision/Assistance - 24 hour (initially)    Equipment Recommendations  3 in 1 bedside commode    Recommendations for Other Services       Precautions / Restrictions Precautions Precautions: Fall Restrictions Weight Bearing Restrictions: No      Mobility Bed Mobility Overal bed mobility: Needs Assistance Bed Mobility: Supine to Sit     Supine to sit: Min guard          Transfers Overall transfer level: Needs assistance Equipment used: Rolling walker (2 wheeled) Transfers: Sit to/from Stand Sit to Stand: Min guard              Balance Overall balance assessment: Needs assistance Sitting-balance support: Feet supported Sitting balance-Leahy Scale: Fair     Standing balance support: Bilateral upper extremity supported;During functional activity Standing balance-Leahy Scale: Fair Standing balance  comment: able to remove UE support standing at sink for grooming tasks                           ADL either performed or assessed with clinical judgement   ADL Overall ADL's : Needs assistance/impaired Eating/Feeding: Set up;Sitting   Grooming: Min guard;Standing;Oral care;Brushing hair Grooming Details (indicate cue type and reason): standing at sink for x2 grooming tasks Upper Body Bathing: Set up;Sitting   Lower Body Bathing: Minimal assistance;Sitting/lateral leans;Sit to/from stand   Upper Body Dressing : Set up;Sitting   Lower Body Dressing: Minimal assistance;Sit to/from stand;Sitting/lateral leans   Toilet Transfer: Min guard;Ambulation;Regular Toilet;RW;Grab bars Toilet Transfer Details (indicate cue type and reason): mobilized to toilet in room for transfer. Able to stand from low toilet with grab bars Toileting- Clothing Manipulation and Hygiene: Minimal assistance;Sit to/from stand       Functional mobility during ADLs: Min guard;Rolling walker;Cueing for safety       Vision Patient Visual Report: No change from baseline       Perception     Praxis      Pertinent Vitals/Pain Pain Assessment: No/denies pain     Hand Dominance     Extremity/Trunk Assessment Upper Extremity Assessment Upper Extremity Assessment: Generalized weakness   Lower Extremity Assessment Lower Extremity Assessment: Generalized weakness       Communication Communication Communication: No difficulties   Cognition Arousal/Alertness: Awake/alert Behavior During Therapy: WFL for tasks assessed/performed Overall Cognitive Status: Impaired/Different from baseline Area of Impairment: Orientation;Memory;Safety/judgement;Awareness;Problem solving  Orientation Level: Time;Situation   Memory: Decreased short-term memory   Safety/Judgement: Decreased awareness of safety;Decreased awareness of deficits Awareness: Emergent Problem Solving: Difficulty  sequencing;Requires verbal cues;Slow processing General Comments: pt with history of dementia at baseline (mild), is normally able to complete IADLs. noted decreased orientation to current situation in session, as well as deficits. Also required sequencing cues while brushing teeth   General Comments       Exercises     Shoulder Instructions      Home Living Family/patient expects to be discharged to:: Private residence Living Arrangements: Alone Available Help at Discharge: Family;Available PRN/intermittently (two daughters live near by) Type of Home: House Home Access: Stairs to enter [employer] of Steps: 2   Home Layout: One level     Bathroom Shower/Tub: Teacher, early years/pre: Standard     Home Equipment: None          Prior Functioning/Environment Level of Independence: Needs assistance  Gait / Transfers Assistance Needed: No falls, independent, enjoys yard work, drives ADL's / Land Needed: Independent ADL's/IADL's, Automatic dispenser for medication            OT Problem List: Decreased knowledge of use of DME or AE;Decreased strength;Decreased knowledge of precautions;Decreased activity tolerance;Decreased cognition;Cardiopulmonary status limiting activity;Impaired balance (sitting and/or standing);Decreased safety awareness      OT Treatment/Interventions: Self-care/ADL training;Therapeutic exercise;Patient/family education;Balance training;Energy conservation;Therapeutic activities;DME and/or AE instruction;Cognitive remediation/compensation    OT Goals(Current goals can be found in the care plan section) Acute Rehab OT Goals Patient Stated Goal: pt daughter would like her to return home OT Goal Formulation: With patient Time For Goal Achievement: 09/21/20 Potential to Achieve Goals: Good  OT Frequency: Min 2X/week   Barriers to D/C:            Co-evaluation              AM-PAC OT "6 Clicks" Daily  Activity     Outcome Measure Help from another person eating meals?: A Little Help from another person taking care of personal grooming?: A Little Help from another person toileting, which includes using toliet, bedpan, or urinal?: A Little Help from another person bathing (including washing, rinsing, drying)?: A Little Help from another person to put on and taking off regular upper body clothing?: A Little Help from another person to put on and taking off regular lower body clothing?: A Little 6 Click Score: 18   End of Session Equipment Utilized During Treatment: Gait belt;Rolling walker Nurse Communication: Mobility status  Activity Tolerance: Patient tolerated treatment well Patient left: in chair;with call bell/phone within reach;with family/visitor present  OT Visit Diagnosis: Other abnormalities of gait and mobility (R26.89);Muscle weakness (generalized) (M62.81);Other symptoms and signs involving cognitive function                Time: 4536-4680 OT Time Calculation (min): 42 min Charges:  OT General Charges $OT Visit: 1 Visit OT Evaluation $OT Eval Moderate Complexity: 1 Mod OT Treatments $Self Care/Home Management : 23-37 mins  Zenovia Jarred, MSOT, OTR/L Acute Rehabilitation Services [facility] Office Number: [phone] Pager: [phone]  Zenovia Jarred 09/07/2020, 12:40 PM

## 2020-09-07 NOTE — Progress Notes (Signed)
   09/07/20 1100  Clinical Encounter Type  Visited With Patient and family together  Visit Type Initial  Referral From Social work  Consult/Referral To [employer]  Spiritual Encounters  Spiritual Needs Other (Comment)  Stress Factors  Patient Stress Factors None identified  Family Stress Factors None identified  Chaplain followed up call from SW for AD. Chaplain provided materials to daughter of patient. Patient is being released today and AD will be filled out and signed along with other materials at attorney's office.

## 2020-09-07 NOTE — TOC Initial Note (Signed)
Transition of Care [facility]) - Initial/Assessment Note    Patient Details  Name: Katelyn Powers MRN: [id number] Date of Birth: 04-25-1932  [facility]) CM/SW Contact:    Katelyn Roys, RN Phone Number: 09/07/2020, 1:37 PM  Clinical Narrative:  Patient presented for [place]. Prior to arrival from home alone and has support of her daughters. Daughter Katelyn Powers at the bedside and Case manager spoke to Katelyn Powers via phone. Case Manager discussed home health services and provided family with the Medicare.gov list. Daughter Katelyn Powers chose [facility] for physical therapy, occupational therapy and aide. Referral made to [facility] and the [facility] office is able to service the patient. Family is agreeable to services. Office to call the family to schedule services. Office will try to see the patient on 09-08-20. Durable Medical Equipment ordered via [employer] and delivered to the room- bedside commode and rolling walker. Case Manager called chaplain to assist with advanced directives. Daughter to provide transportation home. No further needs from Case Manager at this time.                    Expected Discharge Plan: [place] Barriers to Discharge: No Barriers Identified   Patient Goals and CMS Choice Patient states their goals for this hospitalization and ongoing recovery are:: to return home. CMS Medicare.gov Compare Post Acute Care list provided to:: Patient Choice offered to / list presented to : Patient  Expected Discharge Plan and Services Expected Discharge Plan: [place] In-house Referral: NA Discharge Planning Services: CM Consult Post Acute Care Choice: [place] arrangements for the past 2 months: Single Family Home Expected Discharge Date: 09/07/20               DME Arranged: Gilford Rile rolling, Bedside commode DME Agency: [employer] Date DME Agency Contacted: 09/07/20 Time DME Agency Contacted: 51 Representative  spoke with at DME Agency: Katelyn Powers: PT, OT, Nurse's Aide [place] Agency: [facility] [place] Date [place]: 09/07/20 Time HH Agency Contacted: 4 Representative spoke with at [place]: Katelyn Powers  Prior Living Arrangements/Services Living arrangements for the past 2 months: [place] with:: Self (Pt will have daughters alternate care.) Patient language and need for interpreter reviewed:: Yes Do you feel safe going back to the place where you live?: Yes      Need for Family Participation in Patient Care: Yes (Comment) Care giver support system in place?: Yes (comment)   Criminal Activity/Legal Involvement Pertinent to Current Situation/Hospitalization: No - Comment as needed  Activities of Daily Living Home Assistive Devices/Equipment: None ADL Screening (condition at time of admission) Patient's cognitive ability adequate to safely complete daily activities?: No Is the patient deaf or have difficulty hearing?: No Does the patient have difficulty seeing, even when wearing glasses/contacts?: No Does the patient have difficulty concentrating, remembering, or making decisions?: Yes Patient able to express need for assistance with ADLs?: Yes Does the patient have difficulty dressing or bathing?: No Independently performs ADLs?: Yes (appropriate for developmental age) Does the patient have difficulty walking or climbing stairs?: Yes Weakness of Legs: Both Weakness of Arms/Hands: None  Permission Sought/Granted Permission sought to share information with : Family Supports, Customer service manager, Case Optician, dispensing granted to share information with : Yes, Verbal Permission Granted     Permission granted to share info w AGENCY: [facility]        Emotional Assessment Appearance:: Appears stated age Attitude/Demeanor/Rapport: Engaged Affect (typically observed):  Appropriate Orientation: : Oriented to Self Alcohol / Substance Use: Not  Applicable Psych Involvement: No (comment)  Admission diagnosis:  NSTEMI (non-ST elevated myocardial infarction) [facility]) [I21.4] Patient Active Problem List   Diagnosis Date Noted  . Hypotension 09/06/2020  . NSTEMI (non-ST elevated myocardial infarction) ([place]) 09/04/2020  . Vitamin D deficiency 10/27/2019  . Excessive sleepiness 10/27/2019  . Benign essential HTN 07/27/2018  . 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 Hyperlipidemia 07/27/2018  . Memory loss 07/27/2018  . Insomnia 07/27/2018  . Abnormal brain MRI 07/27/2018  . Carotid stenosis, right 07/27/2018   PCP:  Katelyn Sakai, DO Pharmacy:   [id number] [place], [address] [place] [address] Phone: [phone] Fax: [place], [address], [address] [place], [place] [address] Phone: [phone] Fax: [phone]   Readmission Risk Interventions No flowsheet data found.

## 2020-09-07 NOTE — Discharge Summary (Addendum)
Discharge Summary    Patient ID: Katelyn Powers MRN: [id number]; DOB: 21-Feb-1932  Admit date: 09/04/2020 Discharge date: 09/07/2020  Primary Care Provider: Isaias Sakai, DO  Primary Cardiologist: Katelyn Grooms, MD  Primary Electrophysiologist:  None   Discharge Diagnoses    Principal Problem:   NSTEMI (non-ST elevated myocardial infarction) [facility]) Active Problems:   Benign essential HTN   Hyperlipidemia   Memory loss   Carotid stenosis, right   Hypotension    Diagnostic Studies/Procedures    Complete Echocardiogram 09/05/2020: Impressions: 1. Left ventricular ejection fraction, by estimation, is 50 to 55%. The  left ventricle has low normal function. The left ventricle demonstrates  regional wall motion abnormalities (see scoring diagram/findings for  description). Left ventricular diastolic  parameters are consistent with Grade I diastolic dysfunction (impaired  relaxation). Elevated left atrial pressure.  2. Right ventricular systolic function is normal. The right ventricular  size is normal. Tricuspid regurgitation signal is inadequate for assessing  PA pressure.  3. Left atrial size was mildly dilated.  4. The mitral valve is normal in structure. Trivial mitral valve  regurgitation.  5. The aortic valve is tricuspid. There is moderate calcification of the  aortic valve. There is moderate thickening of the aortic valve. Aortic  valve regurgitation is mild.  6. The inferior vena cava is normal in size with greater than 50%  respiratory variability, suggesting right atrial pressure of 3 mmHg.   Comparison(s): Prior images reviewed side by side. The left ventricular  function is worsened. The left ventricular wall motion abnormality is new.  _____________  Limited Echocardiogram 09/06/2020: Impressions: 1. Left ventricular ejection fraction, by estimation, is 55 to 60%. The  left ventricle has normal function. Left ventricular diastolic  parameters  are consistent with Grade I diastolic dysfunction (impaired relaxation).  Elevated left atrial pressure.  2. Mild mitral valve regurgitation.  3. The aortic valve is tricuspid. Mild to moderate aortic valve  sclerosis/calcification is present, without any evidence of aortic  stenosis.   Comparison(s): Prior images reviewed side by side. The left ventricular  function has improved.   History of Present Illness     Katelyn Powers is a 86 y.o. female with a history of mild aortic stenosis, bilateral carotid artery disease, hypertension, hyperlipidemia, and dementia who was admitted with NSTEMI on 09/04/2020.  Patient presented to [facility] on 09/04/2020 with chest pressure which started around 4am that morning. She noted she woke up from sleep to get some water and had left sided neck pain although she could not fully recall. Patient has a history of dementia; therefore, daughter assisted with HPI. Daughter reported that patient called her early on morning of presentation with chest pressure and left breast pain with associated dizziness. EMS was called and on their arrival patient was given 2 doses of sublingual Nitro with resolution of chest pain. EKG in the field showed slight ST elevations in inferior leads; therefore, Code STEMI was called.   Patient was seen by STEMI MD (Dr. Martinique) in the ED. At that time, patient denied chest pain recurrent. On Dr. Doug Powers review, EKG did not meet STEMI criteria. Given advanced age, dementia, and the fact that she was chest pain free, STEMI was cancelled. High-sensitivity troponin elevated at 223 >> 958. Patient was started on IV Heparin and IV Nitro and admitted. Of note, BP was elevated on arrival to the ED and remained elevated with systolic BP in the 332-951 range.   Hospital Course  Consultants: None  NSTEMI Patient admitted for NSTEMI as stated above. High-sensitivity troponin peaked at 2,123. Echo showed LVEF of 50-55% with  hypokinesis of apical inferior segment and grade 1 diastolic dysfunction. Patient had increased confusion and sundowning during admission. She had no recurrent chest pain. Therefore, decision was made to treat medically. She was treated with IV Heparin for 48 hours and loaded with Plavix. Will plan to continue DAPT with Aspirin 81mg  daily and Plavix 75mg  daily for 12 months. She was initially started on Coreg 25mg  twice daily given elevated BP. We decreased this to 3.125mg  twice daily after episode of hypotension and bradycardia mentioned above. Resting BP still on the softer side in the 50's to 60's, so will stop Coreg completely at this time and can consider restarting beta-blocker at at follow-up. Patient intolerant to statins in the past so started on Zetia 10mg  daily.  Hypertension Episode of Hypotension and Bradycardia BP initially very elevated with systolic BP as high as the 170's to 180's. PTA medications were listed as Amlodipine 10mg  daily and Losartan 100mg  daily. These were continued. Coreg 25mg  twice daily was also added. With increased regimen, BP initially remained high. However, on morning of 09/06/2020 after morning medications were given, patient became bradycardic with rates as low as the 30's with brief junctional escape (resolved rapidly without need of Atropine), hypotensive with systolic BP as low as the 70's, and very lethargic and not arousable. She was given 1L fluid bolus and placed in reverse Trendelenburg and BP quickly improved and patient became alert and responsive. EKG showed sinus bradycardia, rate 56 bpm, with T wave inversions in inferior leads and V3-V6 .Repeat limited Echo showed actually showed slight improvement in LVEF to 55-60%. Lactic acid was initially mildly elevated but was normal on repeat. Electrolytes normal. BP medications were adjusted. This episodes was felt to be due to BP medications and not any mechanical complication from her MI. After discussion with  family, it was revealed that patient was actually only on Amlodipine 5mg  and Losartan 50mg  daily at home. Patient's daughters very concerned about change in antihypertensive regimen. Therefore, we will discharge her on home dose of Amlodipine and Losartan. Will hold off on beta-blocker for now. Asked that daughters keep daily heart rate and BP log and bring to follow-up visit.   Hyperlipidemia Lipid panel this admission showed Total Cholesterol 234, Triglycerides 220, HDL 52, LDL 138. Intolerant to statins in the past. Started on Zetia 10mg  daily this admission. Will need repeat lipid panel and LFTs in 6-8 weeks.   Bilateral Carotid Artery Disease Most recent carotid dopplers in 06/2018 showed 60-79% stenosis of right ICA and 1-39% stenosis of left ICA. On DAPT for NSTEMI. Intolerant to statins in the past. Started on Zetia 10mg  daily.  Dementia Dr. Audie Box had long discussion with patient's daughters about goals of care and recommended DNR status given progressive dementia given patient unlikely to benefit from aggressive measure such as CPR or intubation. Daughter wanted to think about his more and discuss with Care Management. Patient still driving prior to admission but daughters state she recently got lost while driving. We discussed with daughters that we feel like it is unsafe for patient to continue to drive at this point.  PT/OT were consulted. PT recommended home health PT with 24 hour supervision/assistance (patient's daughter did not want SNF). Will place DME order for rolling walker and 3in1. Case Management consulted for assistant. Will place order for home health PT/OT/Aide at discharge.  Patient was  seen and examined by Dr. Audie Box today and determined to be stable for discharge. Outpatient follow-up has been arranged. Medications as below.  Did the patient have an acute coronary syndrome (MI, NSTEMI, STEMI, etc) this admission?:  Yes                               AHA/ACC Clinical  Performance & Quality Measures: 1. Aspirin prescribed? - Yes 2. ADP Receptor Inhibitor (Plavix/Clopidogrel, Brilinta/Ticagrelor or Effient/Prasugrel) prescribed (includes medically managed patients)? - Yes 3. Beta Blocker prescribed? - No - labile BP and low resting heart rates 4. High Intensity Statin (Lipitor 40-80mg  or Crestor 20-40mg ) prescribed? - No - intolerant to statins in the past 5. EF assessed during THIS hospitalization? - Yes 6. For EF <40%, was ACEI/ARB prescribed? - Not Applicable (EF >/= 32%) 7. For EF <40%, Aldosterone Antagonist (Spironolactone or Eplerenone) prescribed? - Not Applicable (EF >/= 95%) 8. Cardiac Rehab Phase II ordered (including medically managed patients)? - Yes   _____________  Discharge Vitals Blood pressure (!) 131/53, pulse 65, temperature 98.3 F (36.8 C), temperature source Oral, resp. rate 19, height 5\' 2"  (1.575 m), weight 67 kg, SpO2 96 %.  Filed Weights   09/04/20 2104 09/07/20 0410  Weight: 70.3 kg 67 kg   General: 86 y.o. female resting comfortably in no acute distress. HEENT: Normocephalic and atraumatic. Sclera clear.  Neck: Supple. No JVD. Heart: RRR. Distinct S1 and S2. Soft systolic murmur noted. No gallops or rubs.  Lungs: No increased work of breathing. Clear to ausculation bilaterally. No wheezes, rhonchi, or rales.  Abdomen: Soft, non-distended, and non-tender to palpation.  Extremities: No lower extremity edema.    Skin: Warm and dry. Neuro: Alert and oriented x3. No focal deficits. Psych: Normal affect. Responds appropriately.  Labs & Radiologic Studies    CBC Recent Labs    09/06/20 0713 09/07/20 0340  WBC 9.1 9.3  HGB 12.3 12.2  HCT 37.0 36.7  MCV 92.0 92.4  PLT 193 188   Basic Metabolic Panel Recent Labs    09/05/20 0451 09/06/20 0719 09/06/20 1218  NA 140 139  --   K 3.7 3.7  --   CL 106 106  --   CO2 24 23  --   GLUCOSE 106* 112*  --   BUN 20 18  --   CREATININE 1.00 0.96  --   CALCIUM 9.5 9.6  --    MG  --   --  1.9   Liver Function Tests No results for input(s): AST, ALT, ALKPHOS, BILITOT, PROT, ALBUMIN in the last 72 hours. No results for input(s): LIPASE, AMYLASE in the last 72 hours. High Sensitivity Troponin:   Recent Labs  Lab 09/04/20 1137 09/04/20 1628 09/04/20 1904 09/05/20 0855 09/05/20 1314  TROPONINIHS 958* 1,460* 1,633* 2,123* 1,807*    BNP Invalid input(s): POCBNP D-Dimer No results for input(s): DDIMER in the last 72 hours. Hemoglobin A1C No results for input(s): HGBA1C in the last 72 hours. Fasting Lipid Panel No results for input(s): CHOL, HDL, LDLCALC, TRIG, CHOLHDL, LDLDIRECT in the last 72 hours. Thyroid Function Tests Recent Labs    09/04/20 1628  TSH 2.924   _____________  DG Chest 2 View  Result Date: 09/04/2020 CLINICAL DATA:  Code STEMI.  Chest pressure. EXAM: CHEST - 2 VIEW COMPARISON:  Mar 07, 2018. FINDINGS: The heart size and mediastinal contours are within normal limits. Aortic atherosclerosis. No consolidation. No visible pleural  effusions or pneumothorax. Right axillary clips. Remote right rib fractures. IMPRESSION: No acute cardiopulmonary disease. Electronically Signed   By: Margaretha Sheffield MD   On: 09/04/2020 09:56   ECHOCARDIOGRAM COMPLETE  Result Date: 09/05/2020    ECHOCARDIOGRAM REPORT   Patient Name:   Katelyn Powers Date of Exam: 09/05/2020 Medical Rec #:  [id number]   Height:       62.0 in Accession #:    [id number]  Weight:       155.0 lb Date of Birth:  [birth year]  BSA:          1.715 m Patient Age:    5 years    BP:           145/105 mmHg Patient Gender: F           HR:           65 bpm. Exam Location:  Inpatient Procedure: 2D Echo Indications:    NSTEMI I21.4  History:        Patient has prior history of Echocardiogram examinations, most                 recent 06/03/2018. Risk Factors:Hypertension and Dyslipidemia.  Sonographer:    Mikki Santee RDCS (AE) Referring Phys: [place]  1. Left ventricular  ejection fraction, by estimation, is 50 to 55%. The left ventricle has low normal function. The left ventricle demonstrates regional wall motion abnormalities (see scoring diagram/findings for description). Left ventricular diastolic  parameters are consistent with Grade I diastolic dysfunction (impaired relaxation). Elevated left atrial pressure.  2. Right ventricular systolic function is normal. The right ventricular size is normal. Tricuspid regurgitation signal is inadequate for assessing PA pressure.  3. Left atrial size was mildly dilated.  4. The mitral valve is normal in structure. Trivial mitral valve regurgitation.  5. The aortic valve is tricuspid. There is moderate calcification of the aortic valve. There is moderate thickening of the aortic valve. Aortic valve regurgitation is mild.  6. The inferior vena cava is normal in size with greater than 50% respiratory variability, suggesting right atrial pressure of 3 mmHg. Comparison(s): Prior images reviewed side by side. The left ventricular function is worsened. The left ventricular wall motion abnormality is new. FINDINGS  Left Ventricle: Left ventricular ejection fraction, by estimation, is 50 to 55%. The left ventricle has low normal function. The left ventricle demonstrates regional wall motion abnormalities. The left ventricular internal cavity size was normal in size. There is no left ventricular hypertrophy. Left ventricular diastolic parameters are consistent with Grade I diastolic dysfunction (impaired relaxation). Elevated left atrial pressure.  LV Wall Scoring: The apical inferior segment is hypokinetic. Right Ventricle: The right ventricular size is normal. No increase in right ventricular wall thickness. Right ventricular systolic function is normal. Tricuspid regurgitation signal is inadequate for assessing PA pressure. Left Atrium: Left atrial size was mildly dilated. Right Atrium: Right atrial size was normal in size. Pericardium: There is  no evidence of pericardial effusion. Mitral Valve: The mitral valve is normal in structure. There is mild thickening of the mitral valve leaflet(s). Trivial mitral valve regurgitation. Tricuspid Valve: The tricuspid valve is normal in structure. Tricuspid valve regurgitation is not demonstrated. Aortic Valve: The aortic valve is tricuspid. There is moderate calcification of the aortic valve. There is moderate thickening of the aortic valve. Aortic valve regurgitation is mild. Aortic regurgitation PHT measures 476 msec. Pulmonic Valve: The pulmonic valve was grossly normal. Pulmonic valve regurgitation is  trivial. Aorta: The aortic root is normal in size and structure. Venous: The inferior vena cava is normal in size with greater than 50% respiratory variability, suggesting right atrial pressure of 3 mmHg. IAS/Shunts: No atrial level shunt detected by color flow Doppler.  LEFT VENTRICLE PLAX 2D LVIDd:         4.10 cm  Diastology LVIDs:         2.60 cm  LV e' medial:    2.68 cm/s LV PW:         1.00 cm  LV E/e' medial:  34.9 LV IVS:        1.10 cm  LV e' lateral:   3.57 cm/s LVOT diam:     2.00 cm  LV E/e' lateral: 26.2 LV SV:         60 LV SV Index:   35 LVOT Area:     3.14 cm  RIGHT VENTRICLE RV S prime:     12.10 cm/s TAPSE (M-mode): 1.0 cm LEFT ATRIUM             Index       RIGHT ATRIUM          Index LA diam:        3.10 cm 1.81 cm/m  RA Area:     8.19 cm LA Vol (A2C):   59.3 ml 34.57 ml/m RA Volume:   14.20 ml 8.28 ml/m LA Vol (A4C):   49.1 ml 28.62 ml/m LA Biplane Vol: 53.8 ml 31.36 ml/m  AORTIC VALVE LVOT Vmax:   83.50 cm/s LVOT Vmean:  60.100 cm/s LVOT VTI:    0.190 m AI PHT:      476 msec  AORTA Ao Root diam: 3.20 cm MITRAL VALVE MV Area (PHT): 2.95 cm     SHUNTS MV Decel Time: 257 msec     Systemic VTI:  0.19 m MV E velocity: 93.40 cm/s   Systemic Diam: 2.00 cm MV A velocity: 146.00 cm/s MV E/A ratio:  0.64 Mihai Croitoru MD Electronically signed by Sanda Klein MD Signature Date/Time:  09/05/2020/10:34:17 AM    Final    ECHOCARDIOGRAM LIMITED  Result Date: 09/06/2020    ECHOCARDIOGRAM LIMITED REPORT   Patient Name:   Katelyn Powers Date of Exam: 09/06/2020 Medical Rec #:  [id number]   Height:       62.0 in Accession #:    [id number]  Weight:       155.0 lb Date of Birth:  Oct 28, 1932  BSA:          1.715 m Patient Age:    58 years    BP:           148/73 mmHg Patient Gender: F           HR:           56 bpm. Exam Location:  Inpatient Procedure: Limited Echo, Limited Color Doppler and Cardiac Doppler STAT ECHO Indications:    Bradycardia [[id number]]  History:        Patient has prior history of Echocardiogram examinations, most                 recent 09/05/2020. Arrythmias:non-specific ST changes; Risk                 Factors:Hypertension, Dyslipidemia and Former Smoker.  Sonographer:    Vickie Epley RDCS Referring Phys: [id number] [place]  1. Left ventricular ejection fraction, by estimation, is 55 to 60%. The left ventricle  has normal function. Left ventricular diastolic parameters are consistent with Grade I diastolic dysfunction (impaired relaxation). Elevated left atrial pressure.  2. Mild mitral valve regurgitation.  3. The aortic valve is tricuspid. Mild to moderate aortic valve sclerosis/calcification is present, without any evidence of aortic stenosis. Comparison(s): Prior images reviewed side by side. The left ventricular function has improved. FINDINGS  Left Ventricle: Left ventricular ejection fraction, by estimation, is 55 to 60%. The left ventricle has normal function. Left ventricular diastolic parameters are consistent with Grade I diastolic dysfunction (impaired relaxation). Elevated left atrial pressure. Mitral Valve: Mild mitral valve regurgitation. Tricuspid Valve: The tricuspid valve is normal in structure. Tricuspid valve regurgitation is trivial. Aortic Valve: The aortic valve is tricuspid. Mild to moderate aortic valve sclerosis/calcification is present,  without any evidence of aortic stenosis. LEFT VENTRICLE PLAX 2D LVIDd:         4.10 cm     Diastology LVIDs:         3.40 cm     LV e' medial:    4.05 cm/s LV PW:         0.80 cm     LV E/e' medial:  24.9 LV IVS:        0.80 cm     LV e' lateral:   3.32 cm/s LVOT diam:     2.00 cm     LV E/e' lateral: 30.4 LV SV:         60 LV SV Index:   35 LVOT Area:     3.14 cm  LV Volumes (MOD) LV vol d, MOD A2C: 58.2 ml LV vol d, MOD A4C: 66.1 ml LV vol s, MOD A2C: 28.1 ml LV vol s, MOD A4C: 26.3 ml LV SV MOD A2C:     30.1 ml LV SV MOD A4C:     66.1 ml LV SV MOD BP:      35.3 ml RIGHT VENTRICLE RV S prime:     8.76 cm/s TAPSE (M-mode): 1.7 cm LEFT ATRIUM         Index LA diam:    3.00 cm 1.75 cm/m  AORTIC VALVE LVOT Vmax:   89.40 cm/s LVOT Vmean:  55.300 cm/s LVOT VTI:    0.192 m  AORTA Ao Root diam: 3.30 cm MITRAL VALVE MV Area (PHT): 2.87 cm     SHUNTS MV Decel Time: 264 msec     Systemic VTI:  0.19 m MV E velocity: 101.00 cm/s  Systemic Diam: 2.00 cm MV A velocity: 111.00 cm/s MV E/A ratio:  0.91 Candee Furbish MD Electronically signed by Candee Furbish MD Signature Date/Time: 09/06/2020/11:26:20 AM    Final    Disposition   Patient is being discharged home today in good condition.  Follow-up Plans & Appointments     Follow-up Information    Belva Crome, MD Follow up.   Specialty: Cardiology Why: Hospital follow-up scheduled for 09/26/2020 at 11:40am. Please arrive 15 minutes early for check-in. If this date/time does not work for you, please call our office to reschedule. Contact information: [address] [place] [address] [phone]              Discharge Instructions    Diet - low sodium heart healthy   Complete by: As directed    Increase activity slowly   Complete by: As directed       Discharge Medications   Allergies as of 09/07/2020   No Known Allergies  Medication List    STOP taking these medications   DHA COMPLETE PO     TAKE these medications    amLODipine 5 MG tablet Commonly known as: NORVASC Take 1 tablet (5 mg total) by mouth daily. What changed:   medication strength  how much to take   aspirin 81 MG tablet Take 81 mg by mouth daily.   clopidogrel 75 MG tablet Commonly known as: PLAVIX Take 1 tablet (75 mg total) by mouth daily.   ezetimibe 10 MG tablet Commonly known as: ZETIA Take 1 tablet (10 mg total) by mouth daily.   losartan 50 MG tablet Commonly known as: COZAAR Take 1 tablet (50 mg total) by mouth daily. What changed:   medication strength  how much to take   Melatonin 10 MG Tabs Take 10 mg by mouth at bedtime.   nitroGLYCERIN 0.4 MG SL tablet Commonly known as: NITROSTAT Place 1 tablet (0.4 mg total) under the tongue every 5 (five) minutes as needed for chest pain.   omega-3 acid ethyl esters 1 g capsule Commonly known as: LOVAZA Take 1 g by mouth 2 (two) times daily.   Vitamin D3 25 MCG (1000 UT) Caps Take 1,000 Units by mouth daily.            Durable Medical Equipment  (From admission, onward)         Start     Ordered   09/07/20 1003  For home use only DME 3 n 1  Once        09/07/20 1003   09/07/20 1003  For home use only DME Walker rolling  Once       Question Answer Comment  Walker: With 5 Inch Wheels   Patient needs a walker to treat with the following condition Dementia ([place])   Patient needs a walker to treat with the following condition Generalized weakness      09/07/20 1003             Outstanding Labs/Studies   Recommend repeat lipid panel and LFTs in 6-8 weeks after starting statin.  Duration of Discharge Encounter   Greater than 30 minutes including physician time.  Signed, Darreld Mclean, PA-C 09/07/2020, 10:43 AM

## 2020-09-07 NOTE — Progress Notes (Signed)
Report given to Sullivan Lone), RN. Patient is alert and oriented x2-3 resting in bed with call light in reach Bed alarm activated. Daughter at bedside. No acute events noted overnight. BP and HR stable after medication changes.  Plan is to be DC'd with home PT and rolling walker. Patient's daughter requests to see case manager today. CBC unremarkable this AM.

## 2020-09-07 NOTE — Progress Notes (Signed)
Physical Therapy Treatment Patient Details Name: Katelyn Powers MRN: [id number] DOB: 1932-10-28 Today's Date: 09/07/2020    History of Present Illness Pt is a 86 y.o. F with significant PMH of mild aortic stenosis, bilateral carotid artery disease, dementia, HTN, who presents for NSTEMI. Plan for medical management.    PT Comments    Pt progressing with mobility. BP stable. Expect she will continue to progress back to baseline in her home environment.   Orthostatic BPs  Sitting 123/85  Standing 133/62  Sitting after amb 142/65      Follow Up Recommendations  Home health PT;Supervision/Assistance - 24 hour     Equipment Recommendations  Rolling walker with 5" wheels;3in1 (PT)    Recommendations for Other Services       Precautions / Restrictions Precautions Precautions: Fall Restrictions Weight Bearing Restrictions: No    Mobility  Bed Mobility Overal bed mobility: Needs Assistance Bed Mobility: Supine to Sit     Supine to sit: Min guard     General bed mobility comments: Pt up in chair  Transfers Overall transfer level: Needs assistance Equipment used: Rolling walker (2 wheeled) Transfers: Sit to/from Stand Sit to Stand: Min guard         General transfer comment: Assist for safety  Ambulation/Gait Ambulation/Gait assistance: Supervision Gait Distance (Feet): 250 Feet Assistive device: Rolling walker (2 wheeled);None;1 person hand held assist Gait Pattern/deviations: Step-through pattern;Decreased stride length Gait velocity: decreased Gait velocity interpretation: 1.31 - 2.62 ft/sec, indicative of limited community ambulator General Gait Details: Assist for safety. Pt with hesitancy at times for walker management. Amb from door back to chair with hand held.   Stairs             Wheelchair Mobility    Modified Rankin (Stroke Patients Only)       Balance Overall balance assessment: Needs assistance Sitting-balance support: Feet  supported Sitting balance-Leahy Scale: Fair     Standing balance support: During functional activity;No upper extremity supported Standing balance-Leahy Scale: Fair Standing balance comment: able to remove UE support standing at sink for grooming tasks                            Cognition Arousal/Alertness: Awake/alert Behavior During Therapy: WFL for tasks assessed/performed Overall Cognitive Status: Impaired/Different from baseline Area of Impairment: Orientation;Memory;Safety/judgement;Awareness;Problem solving                 Orientation Level: Time;Situation   Memory: Decreased short-term memory Following Commands: Follows one step commands with increased time;Follows multi-step commands inconsistently Safety/Judgement: Decreased awareness of safety;Decreased awareness of deficits Awareness: Emergent Problem Solving: Difficulty sequencing;Requires verbal cues;Slow processing General Comments: History of mild dementia      Exercises      General Comments        Pertinent Vitals/Pain Pain Assessment: No/denies pain    Home Living                      Prior Function            PT Goals (current goals can now be found in the care plan section) Acute Rehab PT Goals Patient Stated Goal: pt daughter would like her to return home Progress towards PT goals: Progressing toward goals    Frequency    Min 3X/week      PT Plan Current plan remains appropriate    Co-evaluation  AM-PAC PT "6 Clicks" Mobility   Outcome Measure  Help needed turning from your back to your side while in a flat bed without using bedrails?: None Help needed moving from lying on your back to sitting on the side of a flat bed without using bedrails?: A Little Help needed moving to and from a bed to a chair (including a wheelchair)?: A Little Help needed standing up from a chair using your arms (e.g., wheelchair or bedside chair)?: A  Little Help needed to walk in hospital room?: A Little Help needed climbing 3-5 steps with a railing? : A Lot 6 Click Score: 18    End of Session Equipment Utilized During Treatment: Gait belt Activity Tolerance: Patient tolerated treatment well Patient left: in chair;with call bell/phone within reach;with family/visitor present Nurse Communication: Mobility status PT Visit Diagnosis: Unsteadiness on feet (R26.81);Muscle weakness (generalized) (M62.81);Difficulty in walking, not elsewhere classified (R26.2)     Time: 8159-4707 PT Time Calculation (min) (ACUTE ONLY): 21 min  Charges:  $Gait Training: 8-22 mins                     [place] Pager [phone] Office [place] 09/07/2020, 1:33 PM

## 2020-09-08 ENCOUNTER — Telehealth: Payer: Self-pay | Admitting: Interventional Cardiology

## 2020-09-08 DIAGNOSIS — I214 Non-ST elevation (NSTEMI) myocardial infarction: Secondary | ICD-10-CM | POA: Diagnosis not present

## 2020-09-08 DIAGNOSIS — I10 Essential (primary) hypertension: Secondary | ICD-10-CM | POA: Diagnosis not present

## 2020-09-08 DIAGNOSIS — M6281 Muscle weakness (generalized): Secondary | ICD-10-CM | POA: Diagnosis not present

## 2020-09-08 DIAGNOSIS — E785 Hyperlipidemia, unspecified: Secondary | ICD-10-CM | POA: Diagnosis not present

## 2020-09-08 DIAGNOSIS — I6521 Occlusion and stenosis of right carotid artery: Secondary | ICD-10-CM | POA: Diagnosis not present

## 2020-09-08 NOTE — Telephone Encounter (Signed)
Katelyn Powers with [facility] is following up. She is requesting to speak with Dr. Kathalene Frames nurse for verbal order for patient to begin home health physical therapy. She is hoping Dr. Audie Box is able to assist because he saw patient in the hospital.  Phone#: [phone]

## 2020-09-08 NOTE — Telephone Encounter (Signed)
Spoke with Constance Haw and advised her that Dr Tamala Julian and his RN are out of the office today but will forward message to be reviewed.  Constance Haw thanked RN for the callback.

## 2020-09-08 NOTE — Telephone Encounter (Signed)
Informed that Dr. Marisue Ivan would not be the responsible party for signing/ordering anything. She states that dtr mentioned trying him as he saw pt in hospital.   Aware that Dr. Thompson Caul nurse will follow up on this next week. She appreciates return call.

## 2020-09-08 NOTE — Telephone Encounter (Signed)
   [facility] calling she would like to get a verbal order from [name] if they can start seeing pt on Monday for medical social worker evaluation

## 2020-09-10 NOTE — Telephone Encounter (Signed)
This request should be per primary care provider.

## 2020-09-11 NOTE — Telephone Encounter (Signed)
Spoke with Constance Haw and made her aware request will need to be sent to PCP.  [facility] appreciative for call.

## 2020-09-12 DIAGNOSIS — G301 Alzheimer's disease with late onset: Secondary | ICD-10-CM | POA: Diagnosis not present

## 2020-09-12 DIAGNOSIS — Z79899 Other long term (current) drug therapy: Secondary | ICD-10-CM | POA: Diagnosis not present

## 2020-09-12 DIAGNOSIS — I214 Non-ST elevation (NSTEMI) myocardial infarction: Secondary | ICD-10-CM | POA: Diagnosis not present

## 2020-09-12 DIAGNOSIS — I10 Essential (primary) hypertension: Secondary | ICD-10-CM | POA: Diagnosis not present

## 2020-09-17 DIAGNOSIS — I779 Disorder of arteries and arterioles, unspecified: Secondary | ICD-10-CM

## 2020-09-17 HISTORY — DX: Disorder of arteries and arterioles, unspecified: I77.9

## 2020-09-24 NOTE — Progress Notes (Signed)
Cardiology Office Note:    Date:  09/26/2020   ID:  [name], DOB Nov 17, 1931, MRN [id number]  PCP:  Philmore Pali, NP  Cardiologist:  Sinclair Grooms, MD   Referring MD: Alonna Buckler*   Chief Complaint  Patient presents with  . Coronary Artery Disease  . Hyperlipidemia  . Hypertension  . Chest Pain    History of Present Illness:    Katelyn Powers is a 86 y.o. female with a hx of mild aortic stenosis, right carotid artery disease, and NSTEMI 09/04/2020(med therapy only).  Recent non-ST elevation myocardial infarction in the setting of elevated blood pressure and severely elevated lipids.  Medical therapy chosen.  Multiple complaints from the daughter concerning care received in the hospital and having trust issues with recommendations that were made.  The daughter continued to speak about wanting a more "holistic approach".  She has been tolerating the prescribed therapy without difficulty.  Beta-blocker therapy and statin therapy was refused.  There was very labile blood pressure while hospitalized.  Since discharge, there has been no recurrence of chest discomfort.  Medication compliance has been good.  The daughter is making sure that [name] takes the medication as prescribed every day.  Past Medical History:  Diagnosis Date  . Cataract   . Gout   . Hyperlipidemia   . Hypertension   . Memory loss   . NSTEMI (non-ST elevated myocardial infarction) ([place]) 08/2020    Past Surgical History:  Procedure Laterality Date  . MELANOMA EXCISION Right 1972   level 2  . 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 amLODipine (NORVASC) 5 MG tablet Take 1 tablet (5 mg total) by mouth daily.  Marland Kitchen aspirin 81 MG tablet Take 81 mg by mouth daily.  . Cholecalciferol (VITAMIN D3) 1000 units CAPS Take 1,000 Units by mouth daily.   . clopidogrel (PLAVIX) 75 MG tablet Take 1 tablet (75 mg  total) by mouth daily.  Marland Kitchen ezetimibe (ZETIA) 10 MG tablet Take 1 tablet (10 mg total) by mouth daily.  . Ginkgo Biloba 120 MG CAPS Take 1 capsule by mouth in the morning and at bedtime.  Marland Kitchen losartan (COZAAR) 50 MG tablet Take 1 tablet (50 mg total) by mouth daily.  . Melatonin 10 MG TABS Take 10 mg by mouth at bedtime.   . nitroGLYCERIN (NITROSTAT) 0.4 MG SL tablet Place 1 tablet (0.4 mg total) under the tongue every 5 (five) minutes as needed for chest pain.  Marland Kitchen omega-3 acid ethyl esters (LOVAZA) 1 g capsule Take 1 g by mouth 2 (two) times daily.     Allergies:   Patient has no known allergies.   Social History   Socioeconomic History  . Marital status: Widowed    Spouse name: Not on file  . Number of children: Not on file  . Years of education: Not on file  . Highest education level: Not on file  Occupational History  . Not on file  Tobacco Use  . Smoking status: Former Research scientist (life sciences)  . Smokeless tobacco: Never Used  Vaping Use  . Vaping Use: Never used  Substance and Sexual Activity  . Alcohol use: No    Alcohol/week: 0.0 standard drinks  . Drug use: No  . Sexual activity: Not on file  Other Topics Concern  . Not on file  Social History Narrative  . Not on file  Social Determinants of Health   Financial Resource Strain:   . Difficulty of Paying Living Expenses: Not on file  Food Insecurity:   . Worried About Charity fundraiser in the Last Year: Not on file  . Ran Out of Food in the Last Year: Not on file  Transportation Needs:   . Lack of Transportation (Medical): Not on file  . Lack of Transportation (Non-Medical): Not on file  Physical Activity:   . Days of Exercise per Week: Not on file  . Minutes of Exercise per Session: Not on file  Stress:   . Feeling of Stress : Not on file  Social Connections:   . Frequency of Communication with Friends and Family: Not on file  . Frequency of Social Gatherings with Friends and Family: Not on file  . Attends Religious Services:  Not on file  . Active Member of Clubs or Organizations: Not on file  . Attends Archivist Meetings: Not on file  . Marital Status: Not on file     Family History: The patient's family history includes Colon cancer (age of onset: 38) in her sister; Colon polyps in her brother.  ROS:   Please see the history of present illness.    Decreased memory.  Daughter labels her condition has dementia.  Mother is forgetful and prior to hospitalization, taking her medications daily was left up to the patient.  All other systems reviewed and are negative.  EKGs/Labs/Other Studies Reviewed:    The following studies were reviewed today: 2D Doppler echocardiogram November 2021 IMPRESSIONS    1. Left ventricular ejection fraction, by estimation, is 55 to 60%. The  left ventricle has normal function. Left ventricular diastolic parameters  are consistent with Grade I diastolic dysfunction (impaired relaxation).  Elevated left atrial pressure.  2. Mild mitral valve regurgitation.  3. The aortic valve is tricuspid. Mild to moderate aortic valve  sclerosis/calcification is present, without any evidence of aortic  stenosis.   Comparison(s): Prior images reviewed side by side. The left ventricular  function has improved.    Bilateral carotid Doppler study 07/23/2018: Final Interpretation:  Right Carotid: Velocities in the right ICA are consistent with a 60-79%         stenosis. Non-hemodynamically significant plaque <50% noted  in         the CCA. The ECA appears <50% stenosed.   Left Carotid: Velocities in the left ICA are consistent with a 1-39%  stenosis.        Non-hemodynamically significant plaque noted in the CCA. The  ECA        appears <50% stenosed.   Vertebrals: Bilateral vertebral arteries demonstrate antegrade flow.  Subclavians: Bilateral subclavian arteries were stenotic.    EKG:  EKG not repeated.  09/06/2020 demonstrates  significant symmetrical T wave inversions V3 through 6, I, and aVL.  Recent Labs: 05/10/2020: ALT 14 09/04/2020: TSH 2.924 09/06/2020: BUN 18; Creatinine, Ser 0.96; Magnesium 1.9; Potassium 3.7; Sodium 139 09/07/2020: Hemoglobin 12.2; Platelets 186  Recent Lipid Panel    Component Value Date/Time   CHOL 234 (H) 09/04/2020 0918   TRIG 220 (H) 09/04/2020 0918   HDL 52 09/04/2020 0918   CHOLHDL 4.5 09/04/2020 0918   VLDL 44 (H) 09/04/2020 0918   LDLCALC 138 (H) 09/04/2020 0918    Physical Exam:    VS:  BP (!) 146/74   Pulse 75   Ht 5\' 2"  (1.575 m)   Wt 150 lb (68 kg)   SpO2  97%   BMI 27.44 kg/m     Wt Readings from Last 3 Encounters:  09/26/20 150 lb (68 kg)  09/07/20 147 lb 11.3 oz (67 kg)  05/10/20 145 lb (65.8 kg)     GEN: Appears younger than stated age and does not engage in spontaneous conversation.. No acute distress HEENT: Normal NECK: No JVD. LYMPHATICS: No lymphadenopathy CARDIAC:  RRR with 2/6 to 3/6 crescendo decrescendo systolic right upper sternal murmur, but no gallop, or edema. VASCULAR:  Normal Pulses. No bruits. RESPIRATORY:  Clear to auscultation without rales, wheezing or rhonchi  ABDOMEN: Soft, non-tender, non-distended, No pulsatile mass, MUSCULOSKELETAL: No deformity  SKIN: Warm and dry NEUROLOGIC:  Alert and oriented x 3 PSYCHIATRIC:  Normal affect   ASSESSMENT:    1. Coronary artery disease involving native coronary artery of native heart without angina pectoris   2. Benign essential HTN   3. Hyperlipidemia, unspecified hyperlipidemia type   4. Carotid stenosis, right   5. Educated about COVID-19 virus infection    PLAN:    In order of problems listed above:  1. Recent hospital stay with non-ST elevation MI most likely related to significant underlying obstructive CAD.  Because of age and dementia, the family and managing team agreed upon a trial of medical therapy.  Since discharge and with good control of blood pressure there has been  no recurrence of angina/chest pressure.  The patient has uncontrolled risk factors, the most important of which she has significant elevation in LDL cholesterol that is not being addressed because of intolerance of statins and the desire to use a more natural approach.  Discussed the potential use of bempedoic acid and addition to Zetia and or PCSK9 therapy (although this would be rather aggressive in an 86 year old).  Sublingual nitroglycerin should be used if recurrent chest pain. 2. Blood pressure control is better.  Currently on Cozaar 50 mg/day and Norvasc 5 mg/day.  Beta-blocker therapy was attempted in the hospital but refused.  Target blood pressure 140/80 mmHg 3. See dialogue above.  Continue Zetia and Lovaza.  Consider adding bempedoic acid or escalating to PCSK9 (probably not prudent). 4. Bilateral carotid Doppler.  Family states that if critical carotid disease develops they would consider treatment 5. If she has been vaccinated.  Continue control of blood pressure, dietary measures along with Lovaza and Zetia for lipid control, and have clinical follow-up in 4 to 6 months.   Medication Adjustments/Labs and Tests Ordered: Current medicines are reviewed at length with the patient today.  Concerns regarding medicines are outlined above.  Orders Placed This Encounter  Procedures  . VAS US CAROTID   No orders of the defined types were placed in this encounter.   Patient Instructions  Medication Instructions:  Your physician recommends that you continue on your current medications as directed. Please refer to the Current Medication list given to you today.  *If you need a refill on your cardiac medications before your next appointment, please call your pharmacy*   Lab Work: None If you have labs (blood work) drawn today and your tests are completely normal, you will receive your results only by: Marland Kitchen MyChart Message (if you have MyChart) OR . A paper copy in the mail If you have any  lab test that is abnormal or we need to change your treatment, we will call you to review the results.   Testing/Procedures: Your physician has requested that you have a carotid duplex. This test is an ultrasound of the carotid arteries  in your neck. It looks at blood flow through these arteries that supply the brain with blood. Allow one hour for this exam. There are no restrictions or special instructions.   Follow-Up: At [facility], you and your health needs are our priority.  As part of our continuing mission to provide you with exceptional heart care, we have created designated Provider Care Teams.  These Care Teams include your primary Cardiologist (physician) and Advanced Practice Providers (APPs -  Physician Assistants and Nurse Practitioners) who all work together to provide you with the care you need, when you need it.  We recommend signing up for the patient portal called "MyChart".  Sign up information is provided on this After Visit Summary.  MyChart is used to connect with patients for Virtual Visits (Telemedicine).  Patients are able to view lab/test results, encounter notes, upcoming appointments, etc.  Non-urgent messages can be sent to your provider as well.   To learn more about what you can do with MyChart, go to [email or web address].    Your next appointment:   4 month(s)  The format for your next appointment:   In Person  Provider:   You may see Sinclair Grooms, MD or one of the following Advanced Practice Providers on your designated Care Team:    Truitt Merle, NP  Cecilie Kicks, NP  Kathyrn Drown, NP    Other Instructions      Signed, Sinclair Grooms, MD  09/26/2020 1:01 PM    [place]

## 2020-09-26 ENCOUNTER — Ambulatory Visit: Payer: Medicare Other | Admitting: Interventional Cardiology

## 2020-09-26 ENCOUNTER — Other Ambulatory Visit: Payer: Self-pay

## 2020-09-26 ENCOUNTER — Encounter: Payer: Self-pay | Admitting: Interventional Cardiology

## 2020-09-26 ENCOUNTER — Encounter: Payer: Self-pay | Admitting: *Deleted

## 2020-09-26 VITALS — BP 146/74 | HR 75 | Ht 62.0 in | Wt 150.0 lb

## 2020-09-26 DIAGNOSIS — E785 Hyperlipidemia, unspecified: Secondary | ICD-10-CM | POA: Diagnosis not present

## 2020-09-26 DIAGNOSIS — I10 Essential (primary) hypertension: Secondary | ICD-10-CM | POA: Diagnosis not present

## 2020-09-26 DIAGNOSIS — I2510 Atherosclerotic heart disease of native coronary artery without angina pectoris: Secondary | ICD-10-CM

## 2020-09-26 DIAGNOSIS — I6521 Occlusion and stenosis of right carotid artery: Secondary | ICD-10-CM | POA: Diagnosis not present

## 2020-09-26 DIAGNOSIS — Z7189 Other specified counseling: Secondary | ICD-10-CM

## 2020-09-26 NOTE — Patient Instructions (Addendum)
Medication Instructions:  Your physician recommends that you continue on your current medications as directed. Please refer to the Current Medication list given to you today.  *If you need a refill on your cardiac medications before your next appointment, please call your pharmacy*   Lab Work: None If you have labs (blood work) drawn today and your tests are completely normal, you will receive your results only by: Marland Kitchen MyChart Message (if you have MyChart) OR . A paper copy in the mail If you have any lab test that is abnormal or we need to change your treatment, we will call you to review the results.   Testing/Procedures: Your physician has requested that you have a carotid duplex. This test is an ultrasound of the carotid arteries in your neck. It looks at blood flow through these arteries that supply the brain with blood. Allow one hour for this exam. There are no restrictions or special instructions.   Follow-Up: At [facility], you and your health needs are our priority.  As part of our continuing mission to provide you with exceptional heart care, we have created designated Provider Care Teams.  These Care Teams include your primary Cardiologist (physician) and Advanced Practice Providers (APPs -  Physician Assistants and Nurse Practitioners) who all work together to provide you with the care you need, when you need it.  We recommend signing up for the patient portal called "MyChart".  Sign up information is provided on this After Visit Summary.  MyChart is used to connect with patients for Virtual Visits (Telemedicine).  Patients are able to view lab/test results, encounter notes, upcoming appointments, etc.  Non-urgent messages can be sent to your provider as well.   To learn more about what you can do with MyChart, go to [email or web address].    Your next appointment:   4 month(s)  The format for your next appointment:   In Person  Provider:   You may see Sinclair Grooms, MD or one of the following Advanced Practice Providers on your designated Care Team:    Truitt Merle, NP  Cecilie Kicks, NP  Kathyrn Drown, NP    Other Instructions

## 2020-09-28 DIAGNOSIS — E785 Hyperlipidemia, unspecified: Secondary | ICD-10-CM | POA: Diagnosis not present

## 2020-09-28 DIAGNOSIS — I6521 Occlusion and stenosis of right carotid artery: Secondary | ICD-10-CM | POA: Diagnosis not present

## 2020-09-28 DIAGNOSIS — M6281 Muscle weakness (generalized): Secondary | ICD-10-CM | POA: Diagnosis not present

## 2020-09-28 DIAGNOSIS — I10 Essential (primary) hypertension: Secondary | ICD-10-CM | POA: Diagnosis not present

## 2020-09-28 DIAGNOSIS — I214 Non-ST elevation (NSTEMI) myocardial infarction: Secondary | ICD-10-CM | POA: Diagnosis not present

## 2020-09-29 DIAGNOSIS — I6521 Occlusion and stenosis of right carotid artery: Secondary | ICD-10-CM | POA: Diagnosis not present

## 2020-09-29 DIAGNOSIS — M6281 Muscle weakness (generalized): Secondary | ICD-10-CM | POA: Diagnosis not present

## 2020-09-29 DIAGNOSIS — I214 Non-ST elevation (NSTEMI) myocardial infarction: Secondary | ICD-10-CM | POA: Diagnosis not present

## 2020-09-29 DIAGNOSIS — I10 Essential (primary) hypertension: Secondary | ICD-10-CM | POA: Diagnosis not present

## 2020-09-29 DIAGNOSIS — E785 Hyperlipidemia, unspecified: Secondary | ICD-10-CM | POA: Diagnosis not present

## 2020-10-02 ENCOUNTER — Other Ambulatory Visit: Payer: Self-pay

## 2020-10-02 ENCOUNTER — Other Ambulatory Visit (HOSPITAL_COMMUNITY): Payer: Self-pay | Admitting: Interventional Cardiology

## 2020-10-02 ENCOUNTER — Ambulatory Visit (HOSPITAL_COMMUNITY)
Admission: RE | Admit: 2020-10-02 | Discharge: 2020-10-02 | Disposition: A | Discharge status: Home / Self Care | Payer: Medicare Other | Source: Ambulatory Visit | Attending: Cardiology | Admitting: Cardiology

## 2020-10-02 DIAGNOSIS — I6523 Occlusion and stenosis of bilateral carotid arteries: Secondary | ICD-10-CM

## 2020-10-02 DIAGNOSIS — I6521 Occlusion and stenosis of right carotid artery: Secondary | ICD-10-CM | POA: Diagnosis not present

## 2020-10-03 DIAGNOSIS — E785 Hyperlipidemia, unspecified: Secondary | ICD-10-CM | POA: Diagnosis not present

## 2020-10-03 DIAGNOSIS — I6521 Occlusion and stenosis of right carotid artery: Secondary | ICD-10-CM | POA: Diagnosis not present

## 2020-10-03 DIAGNOSIS — M6281 Muscle weakness (generalized): Secondary | ICD-10-CM | POA: Diagnosis not present

## 2020-10-03 DIAGNOSIS — I10 Essential (primary) hypertension: Secondary | ICD-10-CM | POA: Diagnosis not present

## 2020-10-03 DIAGNOSIS — I214 Non-ST elevation (NSTEMI) myocardial infarction: Secondary | ICD-10-CM | POA: Diagnosis not present

## 2020-10-05 DIAGNOSIS — I214 Non-ST elevation (NSTEMI) myocardial infarction: Secondary | ICD-10-CM | POA: Diagnosis not present

## 2020-10-05 DIAGNOSIS — I6521 Occlusion and stenosis of right carotid artery: Secondary | ICD-10-CM | POA: Diagnosis not present

## 2020-10-05 DIAGNOSIS — M6281 Muscle weakness (generalized): Secondary | ICD-10-CM | POA: Diagnosis not present

## 2020-10-05 DIAGNOSIS — E785 Hyperlipidemia, unspecified: Secondary | ICD-10-CM | POA: Diagnosis not present

## 2020-10-05 DIAGNOSIS — I10 Essential (primary) hypertension: Secondary | ICD-10-CM | POA: Diagnosis not present

## 2020-10-06 ENCOUNTER — Telehealth: Payer: Self-pay | Admitting: Interventional Cardiology

## 2020-10-06 NOTE — Telephone Encounter (Signed)
    [name] daughter returning Jennifer's call. transferred call

## 2020-10-11 DIAGNOSIS — M6281 Muscle weakness (generalized): Secondary | ICD-10-CM | POA: Diagnosis not present

## 2020-10-11 DIAGNOSIS — I6521 Occlusion and stenosis of right carotid artery: Secondary | ICD-10-CM | POA: Diagnosis not present

## 2020-10-11 DIAGNOSIS — E785 Hyperlipidemia, unspecified: Secondary | ICD-10-CM | POA: Diagnosis not present

## 2020-10-11 DIAGNOSIS — I10 Essential (primary) hypertension: Secondary | ICD-10-CM | POA: Diagnosis not present

## 2020-10-11 DIAGNOSIS — I214 Non-ST elevation (NSTEMI) myocardial infarction: Secondary | ICD-10-CM | POA: Diagnosis not present

## 2020-10-25 ENCOUNTER — Telehealth: Payer: Self-pay | Admitting: Neurology

## 2020-10-25 NOTE — Telephone Encounter (Signed)
[name] daughter Babette Relic, on [place] called needing to discuss the pt's medication with RN. Daughter is wanting to know if she is able to add medicinal mushrooms with pt's medication and not have it interact with the other medications that she takes. Please advise.

## 2020-10-25 NOTE — Telephone Encounter (Signed)
Called the patient's daughter to advise that Dr Epimenio Foot is out of the office. Advised the work in [place] did not recommend adding this to her medications. I also advised I had spoke to one of the other nurses who felt Dr Epimenio Foot may not agree with this as well.  She states that she would really like to have Dr Epimenio Foot address this. She is planning on trying this as in her research she feels it is very safe and it improved cognitive function. She just wants to have Dr Epimenio Foot address if there would be any concern for interaction with her medications she is currently on. Advised I would pass this along to Dr Epimenio Foot and that we would call back with what his thoughts are. She was very appreciative and understands that Dr Epimenio Foot is out of the office and will wait to hear

## 2020-10-29 NOTE — Telephone Encounter (Signed)
The main drug interaction to be concerned about with medicinal mushrooms is effect on clotting so they can interact with anticoagulants to increase the risk of bleeding.  They could possibly interact with her aspirin.  And she may wish to run this by her primary care or cardiologist.

## 2020-10-30 NOTE — Telephone Encounter (Signed)
Called the daughter back to advise of the information that Dr. Epimenio Foot provided. I informed that Dr Epimenio Foot would encourage they reach out to her PCP/Cardiologist for their input. She states that she has reached out to them as well. She was very appreciative for the feedback.

## 2020-11-02 DIAGNOSIS — M6281 Muscle weakness (generalized): Secondary | ICD-10-CM | POA: Diagnosis not present

## 2020-11-02 DIAGNOSIS — E785 Hyperlipidemia, unspecified: Secondary | ICD-10-CM | POA: Diagnosis not present

## 2020-11-02 DIAGNOSIS — I6521 Occlusion and stenosis of right carotid artery: Secondary | ICD-10-CM | POA: Diagnosis not present

## 2020-11-02 DIAGNOSIS — I10 Essential (primary) hypertension: Secondary | ICD-10-CM | POA: Diagnosis not present

## 2020-11-02 DIAGNOSIS — R2681 Unsteadiness on feet: Secondary | ICD-10-CM | POA: Diagnosis not present

## 2020-11-02 DIAGNOSIS — I214 Non-ST elevation (NSTEMI) myocardial infarction: Secondary | ICD-10-CM | POA: Diagnosis not present

## 2020-11-07 ENCOUNTER — Observation Stay: Payer: Medicare Other

## 2020-11-07 ENCOUNTER — Encounter: Payer: Self-pay | Admitting: Internal Medicine

## 2020-11-07 ENCOUNTER — Emergency Department: Payer: Medicare Other

## 2020-11-07 ENCOUNTER — Observation Stay
Admission: EM | Admit: 2020-11-07 | Discharge: 2020-11-08 | Disposition: A | Discharge status: Home / Self Care | Payer: Medicare Other | Attending: Family Medicine | Admitting: Family Medicine

## 2020-11-07 ENCOUNTER — Other Ambulatory Visit: Payer: Self-pay

## 2020-11-07 DIAGNOSIS — R413 Other amnesia: Secondary | ICD-10-CM | POA: Diagnosis present

## 2020-11-07 DIAGNOSIS — Z79899 Other long term (current) drug therapy: Secondary | ICD-10-CM | POA: Insufficient documentation

## 2020-11-07 DIAGNOSIS — I639 Cerebral infarction, unspecified: Principal | ICD-10-CM | POA: Diagnosis present

## 2020-11-07 DIAGNOSIS — E782 Mixed hyperlipidemia: Secondary | ICD-10-CM | POA: Diagnosis present

## 2020-11-07 DIAGNOSIS — I6501 Occlusion and stenosis of right vertebral artery: Secondary | ICD-10-CM | POA: Diagnosis not present

## 2020-11-07 DIAGNOSIS — Z20822 Contact with and (suspected) exposure to covid-19: Secondary | ICD-10-CM | POA: Diagnosis not present

## 2020-11-07 DIAGNOSIS — Z87891 Personal history of nicotine dependence: Secondary | ICD-10-CM | POA: Insufficient documentation

## 2020-11-07 DIAGNOSIS — I10 Essential (primary) hypertension: Secondary | ICD-10-CM | POA: Diagnosis not present

## 2020-11-07 DIAGNOSIS — R0902 Hypoxemia: Secondary | ICD-10-CM | POA: Diagnosis not present

## 2020-11-07 DIAGNOSIS — E785 Hyperlipidemia, unspecified: Secondary | ICD-10-CM | POA: Diagnosis present

## 2020-11-07 DIAGNOSIS — R6889 Other general symptoms and signs: Secondary | ICD-10-CM | POA: Diagnosis not present

## 2020-11-07 DIAGNOSIS — R531 Weakness: Secondary | ICD-10-CM

## 2020-11-07 DIAGNOSIS — I6521 Occlusion and stenosis of right carotid artery: Secondary | ICD-10-CM | POA: Diagnosis not present

## 2020-11-07 DIAGNOSIS — E1165 Type 2 diabetes mellitus with hyperglycemia: Secondary | ICD-10-CM | POA: Diagnosis not present

## 2020-11-07 DIAGNOSIS — I2510 Atherosclerotic heart disease of native coronary artery without angina pectoris: Secondary | ICD-10-CM | POA: Insufficient documentation

## 2020-11-07 DIAGNOSIS — I6782 Cerebral ischemia: Secondary | ICD-10-CM | POA: Diagnosis not present

## 2020-11-07 DIAGNOSIS — Z7982 Long term (current) use of aspirin: Secondary | ICD-10-CM | POA: Diagnosis not present

## 2020-11-07 DIAGNOSIS — R2681 Unsteadiness on feet: Secondary | ICD-10-CM | POA: Insufficient documentation

## 2020-11-07 DIAGNOSIS — G319 Degenerative disease of nervous system, unspecified: Secondary | ICD-10-CM | POA: Diagnosis not present

## 2020-11-07 DIAGNOSIS — R29818 Other symptoms and signs involving the nervous system: Secondary | ICD-10-CM | POA: Diagnosis not present

## 2020-11-07 DIAGNOSIS — Z743 Need for continuous supervision: Secondary | ICD-10-CM | POA: Diagnosis not present

## 2020-11-07 LAB — CBC WITH DIFFERENTIAL/PLATELET
Abs Immature Granulocytes: 0.03 10*3/uL (ref 0.00–0.07)
Basophils Absolute: 0 10*3/uL (ref 0.0–0.1)
Basophils Relative: 1 %
Eosinophils Absolute: 0.1 10*3/uL (ref 0.0–0.5)
Eosinophils Relative: 2 %
HCT: 40.3 % (ref 36.0–46.0)
Hemoglobin: 13.6 g/dL (ref 12.0–15.0)
Immature Granulocytes: 0 %
Lymphocytes Relative: 16 %
Lymphs Abs: 1.3 10*3/uL (ref 0.7–4.0)
MCH: 30.9 pg (ref 26.0–34.0)
MCHC: 33.7 g/dL (ref 30.0–36.0)
MCV: 91.6 fL (ref 80.0–100.0)
Monocytes Absolute: 0.5 10*3/uL (ref 0.1–1.0)
Monocytes Relative: 7 %
Neutro Abs: 6.1 10*3/uL (ref 1.7–7.7)
Neutrophils Relative %: 74 %
Platelets: 207 10*3/uL (ref 150–400)
RBC: 4.4 MIL/uL (ref 3.87–5.11)
RDW: 13.4 % (ref 11.5–15.5)
WBC: 8.1 10*3/uL (ref 4.0–10.5)
nRBC: 0 % (ref 0.0–0.2)

## 2020-11-07 LAB — COMPREHENSIVE METABOLIC PANEL
ALT: 14 U/L (ref 0–44)
AST: 21 U/L (ref 15–41)
Albumin: 3.9 g/dL (ref 3.5–5.0)
Alkaline Phosphatase: 80 U/L (ref 38–126)
Anion gap: 11 (ref 5–15)
BUN: 19 mg/dL (ref 8–23)
CO2: 20 mmol/L — ABNORMAL LOW (ref 22–32)
Calcium: 9.6 mg/dL (ref 8.9–10.3)
Chloride: 105 mmol/L (ref 98–111)
Creatinine, Ser: 0.93 mg/dL (ref 0.44–1.00)
GFR, Estimated: 60 mL/min — ABNORMAL LOW (ref >60)
Glucose, Bld: 189 mg/dL — ABNORMAL HIGH (ref 70–99)
Potassium: 3.7 mmol/L (ref 3.5–5.1)
Sodium: 136 mmol/L (ref 135–145)
Total Bilirubin: 0.9 mg/dL (ref 0.3–1.2)
Total Protein: 7.6 g/dL (ref 6.5–8.1)

## 2020-11-07 LAB — URINALYSIS, COMPLETE (UACMP) WITH MICROSCOPIC
Bacteria, UA: NONE SEEN
Bilirubin Urine: NEGATIVE
Glucose, UA: NEGATIVE mg/dL
Hgb urine dipstick: NEGATIVE
Ketones, ur: NEGATIVE mg/dL
Leukocytes,Ua: NEGATIVE
Nitrite: NEGATIVE
Protein, ur: 30 mg/dL — AB
Specific Gravity, Urine: 1.008 (ref 1.005–1.030)
Squamous Epithelial / HPF: NONE SEEN (ref 0–5)
pH: 5 (ref 5.0–8.0)

## 2020-11-07 LAB — LACTIC ACID, PLASMA: Lactic Acid, Venous: 1.5 mmol/L (ref 0.5–1.9)

## 2020-11-07 LAB — TROPONIN I (HIGH SENSITIVITY): Troponin I (High Sensitivity): 6 ng/L (ref <18)

## 2020-11-07 MED ORDER — CLOPIDOGREL BISULFATE 75 MG PO TABS
75.0000 mg | ORAL_TABLET | Freq: Every day | ORAL | Status: DC
  Administered 2020-11-08: 75 mg via ORAL
  Filled 2020-11-07 (×2): qty 1

## 2020-11-07 MED ORDER — EZETIMIBE 10 MG PO TABS
10.0000 mg | ORAL_TABLET | Freq: Every day | ORAL | Status: DC
  Administered 2020-11-08: 10 mg via ORAL
  Filled 2020-11-07 (×2): qty 1

## 2020-11-07 MED ORDER — SODIUM CHLORIDE 0.9 % IV SOLN: INTRAVENOUS | Status: DC

## 2020-11-07 MED ORDER — LOSARTAN POTASSIUM 50 MG PO TABS
50.0000 mg | ORAL_TABLET | Freq: Every day | ORAL | Status: DC
  Administered 2020-11-08: 50 mg via ORAL
  Filled 2020-11-07 (×2): qty 1

## 2020-11-07 MED ORDER — ENOXAPARIN SODIUM 40 MG/0.4ML ~~LOC~~ SOLN
40.0000 mg | SUBCUTANEOUS | Status: DC
  Administered 2020-11-07: 40 mg via SUBCUTANEOUS
  Filled 2020-11-07: qty 0.4

## 2020-11-07 MED ORDER — NITROGLYCERIN 0.4 MG SL SUBL: 0.4000 mg | SUBLINGUAL_TABLET | SUBLINGUAL | Status: DC | PRN

## 2020-11-07 MED ORDER — ACETAMINOPHEN 160 MG/5ML PO SOLN
650.0000 mg | ORAL | Status: DC | PRN
  Filled 2020-11-07: qty 20.3

## 2020-11-07 MED ORDER — OMEGA-3-ACID ETHYL ESTERS 1 G PO CAPS
1.0000 g | ORAL_CAPSULE | Freq: Two times a day (BID) | ORAL | Status: DC
  Administered 2020-11-08: 1 g via ORAL
  Filled 2020-11-07 (×3): qty 1

## 2020-11-07 MED ORDER — AMLODIPINE BESYLATE 5 MG PO TABS
5.0000 mg | ORAL_TABLET | Freq: Every day | ORAL | Status: DC
Start: 2020-11-07 — End: 2020-11-08
  Administered 2020-11-08: 5 mg via ORAL
  Filled 2020-11-07 (×2): qty 1

## 2020-11-07 MED ORDER — STROKE: EARLY STAGES OF RECOVERY BOOK: Freq: Once | Status: DC

## 2020-11-07 MED ORDER — ACETAMINOPHEN 650 MG RE SUPP: 650.0000 mg | RECTAL | Status: DC | PRN

## 2020-11-07 MED ORDER — ACETAMINOPHEN 325 MG PO TABS
650.0000 mg | ORAL_TABLET | ORAL | Status: DC | PRN
Start: 2020-11-07 — End: 2020-11-08

## 2020-11-07 MED ORDER — SENNOSIDES-DOCUSATE SODIUM 8.6-50 MG PO TABS
1.0000 | ORAL_TABLET | Freq: Every day | ORAL | Status: DC
  Filled 2020-11-07: qty 1

## 2020-11-07 MED ORDER — MELATONIN 5 MG PO TABS
10.0000 mg | ORAL_TABLET | Freq: Every day | ORAL | Status: DC
  Filled 2020-11-07: qty 2

## 2020-11-07 NOTE — ED Notes (Signed)
Pt to MRI

## 2020-11-07 NOTE — ED Notes (Signed)
Pt ambulated to toilet and back. Minimal assist, steady on feet

## 2020-11-07 NOTE — ED Triage Notes (Signed)
Pt arrives from home via [facility] with complaint of AMS, weakness  Per family, pt was weaker than normal, slightly more on right. Pt with history of dementia, family reports speech was a little more garbled this AM, but improved.   Last known well 2030 last night. Pt denies pain. No known falls.   255 cbg 136/80 bp 84 hr 96% ra 97.1 t

## 2020-11-07 NOTE — ED Notes (Addendum)
This nurse to bedside at this time.  Pt awake and alert - oriented to person/place -- daughter at bedside.  Per daughter pt has ongoing RLE weakness- pt assisted to in room commode and back to bed with 1 person min assist. Speech clear at this time.  RR even and unlabored on RA with symmetrical rise and fall of chest.  Abdomen soft, round, nontender.  Skin warm dry and intact.  NS infusing @50ml /hr to 20G L FA; dressing dry and intact- site free of redness, edema, tenderness. Will monitor for acute changes and maintain plan of care as pt awaits bed placement

## 2020-11-07 NOTE — H&P (Signed)
History and Physical    DESSENCE OSLER [id number] DOB: 1931-12-09 DOA: 11/07/2020  PCP: Philmore Pali, NP (Confirm with patient/family/NH records and if not entered, this has to be entered at [facility] point of entry) Patient coming from: home  I have personally briefly reviewed patient's old medical records in [place]  Chief Complaint: mild right sided weakness, garbled speech  HPI: Katelyn Powers is a 87 y.o. female with medical history significant of CAD with NSTEMI NOv '21, Right carotid dz by doppler 09/17/20, HLD, memory loss. Patient seen by family at 8:30 PM 11/06/20 and was normal. This AM family noted right sided weakness, right facial droop and garbled speech. EMS activated and patient transported to [facility] for evaluaton.  ED Course: T 97.7  128/56  HR 75  RR 16  BMI 27. EDP reports persistent mild right-sided weakness, mild facial droop. Cmet nl except glucose 189, CBC nl, Troponin #1 6. CT head w/o acute change. Call in to neurology - but unknown onset of symptoms, thus not candidate for TPA. TRH called to admit for stroke eval.  Review of Systems: As per HPI otherwise 10 point review of systems negative.    Past Medical History:  Diagnosis Date  . Cataract   . Gout   . Hyperlipidemia   . Hypertension   . Memory loss   . NSTEMI (non-ST elevated myocardial infarction) ([place]) 08/2020  . Right-sided carotid artery disease ([place]) 09/17/2020   carotid doppler wth >60% occlusion    Past Surgical History:  Procedure Laterality Date  . MELANOMA EXCISION Right 1972   level 2  . 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 Since November '21 she has been living with her daughter.    reports that she has quit smoking. She has never used smokeless tobacco. She reports that she does not drink alcohol and does not use drugs.  No  Known Allergies  Family History  Problem Relation Age of Onset  . Colon cancer Sister 101  . Colon polyps Brother      Prior to Admission medications   Medication Sig Start Date End Date Taking? Authorizing Provider  amLODipine (NORVASC) 5 MG tablet Take 1 tablet (5 mg total) by mouth daily. 09/07/20   Darreld Mclean, PA-C  aspirin 81 MG tablet Take 81 mg by mouth daily.    [provider]  Cholecalciferol (VITAMIN D3) 1000 units CAPS Take 1,000 Units by mouth daily.     [provider]  clopidogrel (PLAVIX) 75 MG tablet Take 1 tablet (75 mg total) by mouth daily. 09/07/20   Sande Rives E, PA-C  ezetimibe (ZETIA) 10 MG tablet Take 1 tablet (10 mg total) by mouth daily. 09/07/20   Sande Rives E, PA-C  Ginkgo Biloba 120 MG CAPS Take 1 capsule by mouth in the morning and at bedtime.    [provider]  losartan (COZAAR) 50 MG tablet Take 1 tablet (50 mg total) by mouth daily. 09/07/20   Darreld Mclean, PA-C  Melatonin 10 MG TABS Take 10 mg by mouth at bedtime.     [provider]  nitroGLYCERIN (NITROSTAT) 0.4 MG SL tablet Place 1 tablet (0.4 mg total) under the tongue every 5 (five) minutes as needed for chest pain. 09/07/20   Sande Rives E, PA-C  omega-3 acid ethyl  esters (LOVAZA) 1 g capsule Take 1 g by mouth 2 (two) times daily.    [provider]    Physical Exam: Vitals:   11/07/20 1219 11/07/20 1220 11/07/20 1230  BP: 138/65  128/66  Pulse: 69  75  Resp: 15  16  Temp: 97.7 F (36.5 C)    TempSrc: Oral    SpO2: 98%  100%  Weight:  68 kg   Height:  5\' 2"  (1.575 m)      Vitals:   11/07/20 1219 11/07/20 1220 11/07/20 1230  BP: 138/65  128/66  Pulse: 69  75  Resp: 15  16  Temp: 97.7 F (36.5 C)    TempSrc: Oral    SpO2: 98%  100%  Weight:  68 kg   Height:  5\' 2"  (1.575 m)    General:  WNWD woman in no distress Eyes: PERRL, lids and conjunctivae normal ENMT: Mucous membranes are moist. Posterior  pharynx clear of any exudate or lesions.Normal dentition.  Neck: normal, supple, no masses, no thyromegaly Respiratory: clear to auscultation bilaterally, no wheezing, no crackles. Normal respiratory effort. No accessory muscle use.  Cardiovascular: Regular rate and rhythm, III/VI mm at RSB and LSB, no rubs or gallops. No extremity edema. 2+ pedal pulses. No carotid bruits.  Abdomen: no tenderness, no masses palpated. No hepatosplenomegaly. Bowel sounds positive.  Musculoskeletal: no clubbing / cyanosis. No joint deformity upper and lower extremities. Good ROM, no contractures. Normal muscle tone.  Skin: no rashes, lesions, ulcers. No induration Neurologic: CN 2-12: nl facial movement, minimal flattening of the right naso-labial fold, no deviation of tongue or uvula, nl shoulder shrug. PERRLA, EOMI, no visual field cuts. MS 5/5 throughout except for a possible/minimal weakness proximal right LE, distal strength nl. DTRs 2+ and symmetrical. Speech is clear. Memory-shortterm is abnormal. Psychiatric: Normal judgment and insight. Alert and oriented x 3. Normal mood.     Labs on Admission: I have personally reviewed following labs and imaging studies  CBC: Recent Labs  Lab 11/07/20 1224  WBC 8.1  NEUTROABS 6.1  HGB 13.6  HCT 40.3  MCV 91.6  PLT 009   Basic Metabolic Panel: Recent Labs  Lab 11/07/20 1224  NA 136  K 3.7  CL 105  CO2 20*  GLUCOSE 189*  BUN 19  CREATININE 0.93  CALCIUM 9.6   GFR: Estimated Creatinine Clearance: 39.3 mL/min (by C-G formula based on SCr of 0.93 mg/dL). Liver Function Tests: Recent Labs  Lab 11/07/20 1224  AST 21  ALT 14  ALKPHOS 80  BILITOT 0.9  PROT 7.6  ALBUMIN 3.9   No results for input(s): LIPASE, AMYLASE in the last 168 hours. No results for input(s): AMMONIA in the last 168 hours. Coagulation Profile: No results for input(s): INR, PROTIME in the last 168 hours. Cardiac Enzymes: No results for input(s): CKTOTAL, CKMB, CKMBINDEX,  TROPONINI in the last 168 hours. BNP (last 3 results) No results for input(s): PROBNP in the last 8760 hours. HbA1C: No results for input(s): HGBA1C in the last 72 hours. CBG: No results for input(s): GLUCAP in the last 168 hours. Lipid Profile: No results for input(s): CHOL, HDL, LDLCALC, TRIG, CHOLHDL, LDLDIRECT in the last 72 hours. Thyroid Function Tests: No results for input(s): TSH, T4TOTAL, FREET4, T3FREE, THYROIDAB in the last 72 hours. Anemia Panel: No results for input(s): VITAMINB12, FOLATE, FERRITIN, TIBC, IRON, RETICCTPCT in the last 72 hours. Urine analysis:    Component Value Date/Time   COLORURINE STRAW (A) 06/13/2018 1420  APPEARANCEUR CLEAR (A) 06/13/2018 1420   LABSPEC 1.008 06/13/2018 1420   PHURINE 6.0 06/13/2018 1420   GLUCOSEU 50 (A) 06/13/2018 1420   HGBUR NEGATIVE 06/13/2018 1420   BILIRUBINUR NEGATIVE 06/13/2018 1420   KETONESUR NEGATIVE 06/13/2018 1420   PROTEINUR 30 (A) 06/13/2018 1420   NITRITE NEGATIVE 06/13/2018 1420   LEUKOCYTESUR NEGATIVE 06/13/2018 1420    Radiological Exams on Admission: CT Head Wo Contrast  Result Date: 11/07/2020 CLINICAL DATA:  Neurologic deficit.  Stroke suspected. EXAM: CT HEAD WITHOUT CONTRAST TECHNIQUE: Contiguous axial images were obtained from the base of the skull through the vertex without intravenous contrast. COMPARISON:  06/13/18 FINDINGS: Brain: No evidence of acute infarction, hemorrhage, hydrocephalus, extra-axial collection or mass lesion/mass effect. There is progressive diffuse low-attenuation within the subcortical and periventricular white matter compatible with chronic microvascular disease. Prominence of the sulci and ventricles compatible with brain atrophy. Vascular: No hyperdense vessel or unexpected calcification. Skull: Normal. Negative for fracture or focal lesion. Sinuses/Orbits: No acute finding. Other: None IMPRESSION: 1. No acute intracranial abnormalities. 2. Chronic small vessel ischemic disease  and brain atrophy. Electronically Signed   By: Kerby Moors M.D.   On: 11/07/2020 13:00    EKG: Independently reviewed. Sinus rhythm w/o acute changes  Assessment/Plan Active Problems:   CVA (cerebral vascular accident) ([place])   Benign essential HTN   Hyperlipidemia   Carotid stenosis, right   Memory loss  (please populate well all problems here in Problem List. (For example, if patient is on BP meds at home and you resume or decide to hold them, it is a problem that needs to be her. Same for CAD, COPD, HLD and so on)   1. CVA - patient with right-sided weakness. Has known right carotid disease, and elevated Lipids. Also HTN. Had CT head and carotid dopplers 09/17/20. Is on antihypertensive meds and antilipemic meds. Had A1C 09/04/20  5.8% Plan MRI brain, MRA brain  PT/OT/SLP eval  ASA 324 mg daily, continue plavix  2. HTN- continue home meds  3. HLD - last lipid panel 09/04/20 w/ LDL 138. On zetia and lovaza. Dr. Daneen Schick (cards) had recommended bempedoic vs PCSK9 to patient but declined Plan Continue present meds.   Summary - patient with reported weakness right with minimal to no findings on neuro exam. Family favors evaluation as outlined above. Anticipate short-stay with possible d/c 11/08/20   DVT prophylaxis: lovenox  Code Status: full code  Family Communication: both daughters present for exam. Understand and agree to evaluation plan.  Disposition Plan: home  Consults called: none  Admission status: observation-tele    Adella Hare MD [facility] Pager [phone]  If 7PM-7AM, please contact night-coverage [email or web address] Password [facility]  11/07/2020, 2:41 PM

## 2020-11-07 NOTE — ED Provider Notes (Signed)
[facility] Emergency Department Provider Note ____________________________________________   Event Date/Time   First MD Initiated Contact with Patient 11/07/20 1230     (approximate)  I have reviewed the triage vital signs and the nursing notes.   HISTORY  Chief Complaint Altered Mental Status and Weakness    HPI Katelyn Powers is a 87 y.o. female with PMH as noted below who was noted to have increased right-sided weakness first noticed around 8 this morning when she got up.  She was last seen in her baseline yesterday around 8:30 PM.  The patient had unsteady gait and appeared weak mainly on her right side.  She also appears slightly confused to the family.  The patient states that she feels well.  She states she had some blurred vision earlier but denies any vision changes currently.  She had not noted any weakness although did acknowledge that she had difficulty walking.  Past Medical History:  Diagnosis Date  . Cataract   . Gout   . Hyperlipidemia   . Hypertension   . Memory loss   . NSTEMI (non-ST elevated myocardial infarction) ([place]) 08/2020    Patient Active Problem List   Diagnosis Date Noted  . CVA (cerebral vascular accident) ([place]) 11/07/2020  . Hypotension 09/06/2020  . NSTEMI (non-ST elevated myocardial infarction) ([place]) 09/04/2020  . Vitamin D deficiency 10/27/2019  . Excessive sleepiness 10/27/2019  . Benign essential HTN 07/27/2018  . Disorder of arteries and arterioles ([place]) 07/27/2018  . Hyperlipidemia 07/27/2018  . Memory loss 07/27/2018  . Insomnia 07/27/2018  . Abnormal brain MRI 07/27/2018  . Carotid stenosis, right 07/27/2018    Past Surgical History:  Procedure Laterality Date  . MELANOMA EXCISION Right 1972   level 2  . 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 Authorizing Provider  amLODipine (NORVASC) 5 MG  tablet Take 1 tablet (5 mg total) by mouth daily. 09/07/20   Darreld Mclean, PA-C  aspirin 81 MG tablet Take 81 mg by mouth daily.    [provider]  Cholecalciferol (VITAMIN D3) 1000 units CAPS Take 1,000 Units by mouth daily.     [provider]  clopidogrel (PLAVIX) 75 MG tablet Take 1 tablet (75 mg total) by mouth daily. 09/07/20   Sande Rives E, PA-C  ezetimibe (ZETIA) 10 MG tablet Take 1 tablet (10 mg total) by mouth daily. 09/07/20   Sande Rives E, PA-C  Ginkgo Biloba 120 MG CAPS Take 1 capsule by mouth in the morning and at bedtime.    [provider]  losartan (COZAAR) 50 MG tablet Take 1 tablet (50 mg total) by mouth daily. 09/07/20   Darreld Mclean, PA-C  Melatonin 10 MG TABS Take 10 mg by mouth at bedtime.     [provider]  nitroGLYCERIN (NITROSTAT) 0.4 MG SL tablet Place 1 tablet (0.4 mg total) under the tongue every 5 (five) minutes as needed for chest pain. 09/07/20   Sande Rives E, PA-C  omega-3 acid ethyl esters (LOVAZA) 1 g capsule Take 1 g by mouth 2 (two) times daily.    [provider]    Allergies Patient has no known allergies.  Family History  Problem Relation Age of Onset  . Colon cancer Sister 36  . Colon polyps Brother     Social History Social History   Tobacco Use  .  Smoking status: Former Research scientist (life sciences)  . Smokeless tobacco: Never Used  Vaping Use  . Vaping Use: Never used  Substance Use Topics  . Alcohol use: No    Alcohol/week: 0.0 standard drinks  . Drug use: No    Review of Systems  Constitutional: No fever. Eyes: No visual changes. ENT: No sore throat. Cardiovascular: Denies chest pain. Respiratory: Denies shortness of breath. Gastrointestinal: No vomiting or diarrhea.  Genitourinary: Negative for dysuria.  Musculoskeletal: Negative for back pain. Skin: Negative for rash. Neurological: Negative for headache.  Positive for right-sided  weakness   ____________________________________________   PHYSICAL EXAM:  VITAL SIGNS: ED Triage Vitals  Enc Vitals Group     BP 11/07/20 1219 138/65     Pulse Rate 11/07/20 1219 69     Resp 11/07/20 1219 15     Temp 11/07/20 1219 97.7 F (36.5 C)     Temp Source 11/07/20 1219 Oral     SpO2 11/07/20 1219 98 %     Weight 11/07/20 1220 149 lb 14.6 oz (68 kg)     Height 11/07/20 1220 5\' 2"  (1.575 m)     Head Circumference --      Peak Flow --      Pain Score 11/07/20 1220 0     Pain Loc --      Pain Edu? --      Excl. in [place]? --     Constitutional: Alert and oriented. Well appearing for age and in no acute distress. Eyes: Conjunctivae are normal.  EOMI.  PERRLA. Head: Atraumatic.  Subtle right facial droop. Nose: No congestion/rhinnorhea. Mouth/Throat: Mucous membranes are moist.   Neck: Normal range of motion.  Cardiovascular: Normal rate, regular rhythm. Good peripheral circulation. Respiratory: Normal respiratory effort.  No retractions.  Gastrointestinal: Soft and nontender. No distention.  Genitourinary: No CVA tenderness. Musculoskeletal: No lower extremity edema.  Extremities warm and well perfused.  Neurologic:  Normal speech and language.  Subtle right facial droop.  Minimal weakness to RUE and RLE when compared to the left.  Slight ataxia to the RUE on finger-to-nose. Skin:  Skin is warm and dry. No rash noted. Psychiatric: Mood and affect are normal. Speech and behavior are normal.  ____________________________________________   LABS (all labs ordered are listed, but only abnormal results are displayed)  Labs Reviewed  COMPREHENSIVE METABOLIC PANEL - Abnormal; Notable for the following components:      Result Value   CO2 20 (*)    Glucose, Bld 189 (*)    GFR, Estimated 60 (*)    All other components within normal limits  SARS CORONAVIRUS 2 (TAT 6-24 HRS)  CBC WITH DIFFERENTIAL/PLATELET  LACTIC ACID, PLASMA  LACTIC ACID, PLASMA  URINALYSIS, COMPLETE  (UACMP) WITH MICROSCOPIC  TROPONIN I (HIGH SENSITIVITY)  TROPONIN I (HIGH SENSITIVITY)   ____________________________________________  EKG  ED ECG REPORT I, Arta Silence, the attending physician, personally viewed and interpreted this ECG.  Date: 11/07/2020 EKG Time: 1217 Rate: 69 Rhythm: normal sinus rhythm QRS Axis: normal Intervals: normal ST/T Wave abnormalities: Nonspecific T wave abnormalities Narrative Interpretation: no evidence of acute ischemia  ____________________________________________  RADIOLOGY  CT head: No ICH or other acute abnormality  ____________________________________________   PROCEDURES  Procedure(s) performed: No  Procedures  Critical Care performed: No ____________________________________________   INITIAL IMPRESSION / ASSESSMENT AND PLAN / ED COURSE  Pertinent labs & imaging results that were available during my care of the patient were reviewed by me and considered in my medical decision making (  see chart for details).  87 year old female with PMH as noted above including CAD and hypertension but no stroke history presents with mainly right-sided weakness and apparent subtle altered mental status, first noted when she awoke this morning. She was last seen normal around 830 last night. The symptoms have somewhat improved.  She was last admitted with an NSTEMI and hypertension in November. She was diagnosed with likely dementia as well.  On exam currently, the patient is alert and oriented. She is comfortable appearing. Her vital signs are normal. She has a subtle right facial droop, and minimal weakness of the right upper and lower extremity compared to the left, but she is able to hold them up against gravity for several seconds. She also has very mild ataxia of the right upper extremity.  Presentation is concerning for acute stroke versus TIA. On arrival, the patient was already out of the window for intervention. CT head shows no  acute abnormality. We will obtain labs  and plan to admit for stroke work-up.  ----------------------------------------- 2:37 PM on 11/07/2020 -----------------------------------------  Lab work-up so far is unrevealing.  I discussed the case with Dr. Linda Hedges from the hospitalist service for admission.  __________________________  [name] was evaluated in Emergency Department on 11/07/2020 for the symptoms described in the history of present illness. She was evaluated in the context of the global COVID-19 pandemic, which necessitated consideration that the patient might be at risk for infection with the SARS-CoV-2 virus that causes COVID-19. Institutional protocols and algorithms that pertain to the evaluation of patients at risk for COVID-19 are in a state of rapid change based on information released by regulatory bodies including the CDC and federal and state organizations. These policies and algorithms were followed during the patient's care in the ED.  ____________________________________________   FINAL CLINICAL IMPRESSION(S) / ED DIAGNOSES  Final diagnoses:  Right sided weakness      NEW MEDICATIONS STARTED DURING THIS VISIT:  New Prescriptions   No medications on file     Note:  This document was prepared using Dragon voice recognition software and may include unintentional dictation errors.    Arta Silence, MD 11/07/20 1438

## 2020-11-08 DIAGNOSIS — I639 Cerebral infarction, unspecified: Secondary | ICD-10-CM | POA: Diagnosis not present

## 2020-11-08 LAB — RESP PANEL BY RT-PCR (FLU A&B, COVID) ARPGX2
Influenza A by PCR: NEGATIVE
Influenza B by PCR: NEGATIVE
SARS Coronavirus 2 by RT PCR: NEGATIVE

## 2020-11-08 NOTE — TOC Transition Note (Signed)
Transition of Care [facility]) - Progression Note    Patient Details  Name: Katelyn Powers MRN: [id number] Date of Birth: Apr 19, 1932  Transition of Care [facility]) CM/SW [place] Phone Number: [phone] 11/08/2020, 1:43 PM  Clinical Narrative:     Patient will d/c home with [facility] PT, OT, RN and Speech.  CSW confirmed with Joelene Millin patient is active with [facility].  Attending and ED Staff are updated. TOC consult complete.       Expected Discharge Plan and Services           Expected Discharge Date: 11/08/20                                     Social Determinants of Health (SDOH) Interventions    Readmission Risk Interventions No flowsheet data found.

## 2020-11-08 NOTE — Progress Notes (Signed)
SLP Cancellation Note  Patient Details Name: Katelyn Powers MRN: [id number] DOB: September 15, 1932   Cancelled treatment:       Reason Eval/Treat Not Completed: SLP screened, no needs identified, will sign off  [name] cognition was screened and she is currently at baseline. No further services indicated.   Ryle Buscemi B. Rutherford Nail M.S., CCC-SLP, [place] Office [phone]   Stormy Fabian 11/08/2020, 12:47 PM

## 2020-11-08 NOTE — ED Notes (Signed)
Per night RN pt didn't pass a swallow screen and medications were held. Also stated a SLP was ordered.

## 2020-11-08 NOTE — Discharge Summary (Addendum)
Physician Discharge Summary  Katelyn Powers [id number] DOB: 20-Jun-1932 DOA: 11/07/2020  PCP: Philmore Pali, NP  Admit date: 11/07/2020 .  Discharge date: 11/08/2020.  Admitted From: Home.  Disposition: Home with home health services.  Recommendations for Outpatient Follow-up:  1. Follow up with PCP in 1-2 weeks. 2. Please obtain BMP/CBC in one week. 3.   Advised to follow-up with neurology for further work-up. 4.   Patient was admitted for TIA work-up. Stroke has been ruled out. 5.   Advised to take aspirin and Plavix.  Home Health: Yes ( PT and OT) Equipment/Devices: DME walker  Discharge Condition: Stable CODE STATUS:Full code Diet recommendation: Heart Healthy   Brief [facility] course: This 87 years old female with PMH significant for CAD with non-STEMI in November 2021, right carotid artery disease by Doppler, hyperlipidemia, memory loss was brought to the emergency department by family with right-sided weakness, right facial droop and garbled speech.  Patient was admitted for stroke work-up.  CT head was negative for any acute abnormality. Neurology was consulted by ED physician but due to unknown onset of symptoms Patient was not considered candidate for tPA.  Patient was on antihypertensive medications.  Patient symptoms has resolved. MRI Brain: showed No acute intracranial process. There is remote left parietal insult with sequela of cortical laminar necrosis. Remote right corona radiata insult. Moderate chronic microvascular ischemic changes. MRA head: High-grade narrowing of the left P1 segment with non opacification distally, likely chronic. Moderate to severe right V4 segment narrowing and irregularity likely secondary to atheromatous disease. Patient feels better and wants to be discharged.  MRI report discussed with neurology who recommended aspirin and Plavix on discharge.  Advised to follow-up with neurology in 2 to 4 weeks.  PT recommended home PT and OT patient is  being discharged.  She was managed for below problems.   Discharge Diagnoses:  Active Problems:   Benign essential HTN   Hyperlipidemia   Memory loss   Carotid stenosis, right   CVA (cerebral vascular accident) ([place])   TIA: R/o CVA - Patient presented with right-sided weakness. Has known right carotid disease, and elevated Lipids. Also HTN.  CT head and carotid dopplers  Done in 09/17/20. He is on antihypertensive meds and antilipemic meds. Had A1C 09/04/20  5.8% MRI no acute intracranial process.  There is remote finding consistent with old stroke. PT/OT/SLP eval ; recommended Home PT Continue ASA 81 mg daily, continue plavix. MRI and MRA report discussed with neurology,  recommended patient can be discharged and outpatient follow-up.  HTN- continue home meds  HLD - last lipid panel 09/04/20 w/ LDL 138. On zetia and lovaza. Dr. Daneen Schick (card) had recommended bempedoic vs PCSK9 to patient but declined Continue present meds.      Discharge Instructions  Discharge Instructions    Call MD for:  difficulty breathing, headache or visual disturbances   Complete by: As directed    Call MD for:  persistant dizziness or light-headedness   Complete by: As directed    Call MD for:  persistant nausea and vomiting   Complete by: As directed    Diet - low sodium heart healthy   Complete by: As directed    Diet Carb Modified   Complete by: As directed    Increase activity slowly   Complete by: As directed      Allergies as of 11/08/2020   No Known Allergies     Medication List    TAKE these medications  amLODipine 5 MG tablet Commonly known as: NORVASC Take 1 tablet (5 mg total) by mouth daily.   aspirin 81 MG tablet Take 81 mg by mouth daily.   clopidogrel 75 MG tablet Commonly known as: PLAVIX Take 1 tablet (75 mg total) by mouth daily.   ezetimibe 10 MG tablet Commonly known as: ZETIA Take 1 tablet (10 mg total) by mouth daily.   Ginkgo Biloba 120 MG  Caps Take 120 mg by mouth in the morning and at bedtime.   losartan 50 MG tablet Commonly known as: COZAAR Take 1 tablet (50 mg total) by mouth daily.   Melatonin 10 MG Tabs Take 10 mg by mouth at bedtime.   nitroGLYCERIN 0.4 MG SL tablet Commonly known as: NITROSTAT Place 1 tablet (0.4 mg total) under the tongue every 5 (five) minutes as needed for chest pain.   Omega 3 1200 MG Caps Take 1 capsule by mouth at bedtime.   Vitamin D3 125 MCG (5000 UT) Tabs Take 5,000 Units by mouth at bedtime.            Durable Medical Equipment  (From admission, onward)         Start     Ordered   11/08/20 1308  DME Walker  Once       Question Answer Comment  Walker: With 5 Inch Wheels   Patient needs a walker to treat with the following condition Generalized weakness      11/08/20 1307          Follow-up Information    Philmore Pali, NP Follow up in 1 week(s).   Specialty: Nurse Practitioner Contact information: [place] [address] [phone]        Belva Crome, MD .   Specialty: Cardiology Contact information: [phone] [address] [phone]        Kerney Elbe, MD Follow up in 2 week(s).   Specialty: Neurology             No Known Allergies  Consultations:  None   Procedures/Studies: CT Head Wo Contrast  Result Date: 11/07/2020 CLINICAL DATA:  Neurologic deficit.  Stroke suspected. EXAM: CT HEAD WITHOUT CONTRAST TECHNIQUE: Contiguous axial images were obtained from the base of the skull through the vertex without intravenous contrast. COMPARISON:  06/13/18 FINDINGS: Brain: No evidence of acute infarction, hemorrhage, hydrocephalus, extra-axial collection or mass lesion/mass effect. There is progressive diffuse low-attenuation within the subcortical and periventricular white matter compatible with chronic microvascular disease. Prominence of the sulci and ventricles compatible with brain atrophy.  Vascular: No hyperdense vessel or unexpected calcification. Skull: Normal. Negative for fracture or focal lesion. Sinuses/Orbits: No acute finding. Other: None IMPRESSION: 1. No acute intracranial abnormalities. 2. Chronic small vessel ischemic disease and brain atrophy. Electronically Signed   By: Kerby Moors M.D.   On: 11/07/2020 13:00   MR ANGIO HEAD WO CONTRAST  Result Date: 11/07/2020 CLINICAL DATA:  Neuro deficit, acute, stroke suspected EXAM: MRI HEAD WITHOUT CONTRAST MRA HEAD WITHOUT CONTRAST TECHNIQUE: Multiplanar, multiecho pulse sequences of the brain and surrounding structures were obtained without intravenous contrast. Angiographic images of the head were obtained using MRA technique without contrast. COMPARISON:  11/07/2020 and prior. FINDINGS: MRI HEAD FINDINGS Brain: No diffusion-weighted signal abnormality. No midline shift, ventriculomegaly or extra-axial fluid collection. No mass lesion. Mild cerebral atrophy with ex vacuo dilatation. Moderate chronic microvascular ischemic changes. Remote right corona radiata insult. Sequela of prior cortically based left  parietal insult (20:28). Associated intrinsically T1 hyperintense signal/SWI signal dropout likely reflects cortical laminar necrosis. Vascular: Please see MRA. Skull and upper cervical spine: Normal marrow signal. Sinuses/Orbits: Normal orbits. Clear paranasal sinuses. Trace left mastoid effusion. Other: None. MRA HEAD FINDINGS Anterior circulation: Patent ICAs. Patent ACAs. Patent MCAs. No significant stenosis, proximal occlusion, aneurysm, or vascular malformation. Posterior circulation: Dominant and patent left V4 segment. Moderate to severe multifocal right V4 segment narrowing and irregularity likely secondary to atheromatous disease. Proximally patent PICA. Patent basilar and proximal superior cerebellar arteries. Patent right PCA. High-grade narrowing of the left P1 segment with non opacification distally. Venous sinuses: No  evidence of thrombosis. Anatomic variants: None of significance. IMPRESSION: MRI head: No acute intracranial process. Remote left parietal insult with sequela of cortical laminar necrosis. Remote right corona radiata insult. Moderate chronic microvascular ischemic changes. MRA head: High-grade narrowing of the left P1 segment with non opacification distally, likely chronic. Moderate to severe right V4 segment narrowing and irregularity likely secondary to atheromatous disease. Electronically Signed   By: Primitivo Gauze M.D.   On: 11/07/2020 17:23   MR BRAIN WO CONTRAST  Result Date: 11/07/2020 CLINICAL DATA:  Neuro deficit, acute, stroke suspected EXAM: MRI HEAD WITHOUT CONTRAST MRA HEAD WITHOUT CONTRAST TECHNIQUE: Multiplanar, multiecho pulse sequences of the brain and surrounding structures were obtained without intravenous contrast. Angiographic images of the head were obtained using MRA technique without contrast. COMPARISON:  11/07/2020 and prior. FINDINGS: MRI HEAD FINDINGS Brain: No diffusion-weighted signal abnormality. No midline shift, ventriculomegaly or extra-axial fluid collection. No mass lesion. Mild cerebral atrophy with ex vacuo dilatation. Moderate chronic microvascular ischemic changes. Remote right corona radiata insult. Sequela of prior cortically based left parietal insult (20:28). Associated intrinsically T1 hyperintense signal/SWI signal dropout likely reflects cortical laminar necrosis. Vascular: Please see MRA. Skull and upper cervical spine: Normal marrow signal. Sinuses/Orbits: Normal orbits. Clear paranasal sinuses. Trace left mastoid effusion. Other: None. MRA HEAD FINDINGS Anterior circulation: Patent ICAs. Patent ACAs. Patent MCAs. No significant stenosis, proximal occlusion, aneurysm, or vascular malformation. Posterior circulation: Dominant and patent left V4 segment. Moderate to severe multifocal right V4 segment narrowing and irregularity likely secondary to atheromatous  disease. Proximally patent PICA. Patent basilar and proximal superior cerebellar arteries. Patent right PCA. High-grade narrowing of the left P1 segment with non opacification distally. Venous sinuses: No evidence of thrombosis. Anatomic variants: None of significance. IMPRESSION: MRI head: No acute intracranial process. Remote left parietal insult with sequela of cortical laminar necrosis. Remote right corona radiata insult. Moderate chronic microvascular ischemic changes. MRA head: High-grade narrowing of the left P1 segment with non opacification distally, likely chronic. Moderate to severe right V4 segment narrowing and irregularity likely secondary to atheromatous disease. Electronically Signed   By: Primitivo Gauze M.D.   On: 11/07/2020 17:23     MRI head and MRA head.   Subjective: Patient was seen and examined at bedside.  Overnight events noted.  Patient seems alert and oriented, Daughter was at bedside.  Patient wants to be discharged , Patient cleared with neurology.  MRI report discussed in detail.  Symptoms has resolved  Discharge Exam: Vitals:   11/08/20 0945 11/08/20 1000  BP:    Pulse: 82 95  Resp: 19 (!) 22  Temp:    SpO2: 96% 96%   Vitals:   11/08/20 0916 11/08/20 0930 11/08/20 0945 11/08/20 1000  BP:  (!) 141/124    Pulse:  71 82 95  Resp:  14 19 (!) 22  Temp:  TempSrc:      SpO2:  97% 96% 96%  Weight: 68 kg     Height: 5\' 2"  (1.575 m)       General: Pt is alert, awake, not in acute distress Cardiovascular: RRR, S1/S2 +, no rubs, no gallops Respiratory: CTA bilaterally, no wheezing, no rhonchi Abdominal: Soft, NT, ND, bowel sounds + Extremities: no edema, no cyanosis    The results of significant diagnostics from this hospitalization (including imaging, microbiology, ancillary and laboratory) are listed below for reference.     Microbiology: Recent Results (from the past 240 hour(s))  Resp Panel by RT-PCR (Flu A&B, Covid) Nasopharyngeal Swab      Status: None   Collection Time: 11/08/20  3:30 AM   Specimen: Nasopharyngeal Swab; Nasopharyngeal(NP) swabs in vial transport medium  Result Value Ref Range Status   SARS Coronavirus 2 by RT PCR NEGATIVE NEGATIVE Final    Comment: (NOTE) SARS-CoV-2 target nucleic acids are NOT DETECTED.  The SARS-CoV-2 RNA is generally detectable in upper respiratory specimens during the acute phase of infection. The lowest concentration of SARS-CoV-2 viral copies this assay can detect is 138 copies/mL. A negative result does not preclude SARS-Cov-2 infection and should not be used as the sole basis for treatment or other patient management decisions. A negative result may occur with  improper specimen collection/handling, submission of specimen other than nasopharyngeal swab, presence of viral mutation(s) within the areas targeted by this assay, and inadequate number of viral copies(<138 copies/mL). A negative result must be combined with clinical observations, patient history, and epidemiological information. The expected result is Negative.  Fact Sheet for Patients:  EntrepreneurPulse.com.au  Fact Sheet for Healthcare Providers:  IncredibleEmployment.be  This test is no t yet approved or cleared by the [place] FDA and  has been authorized for detection and/or diagnosis of SARS-CoV-2 by FDA under an Emergency Use Authorization (EUA). This EUA will remain  in effect (meaning this test can be used) for the duration of the COVID-19 declaration under Section 564(b)(1) of the Act, 21 U.S.C.section 360bbb-3(b)(1), unless the authorization is terminated  or revoked sooner.       Influenza A by PCR NEGATIVE NEGATIVE Final   Influenza B by PCR NEGATIVE NEGATIVE Final    Comment: (NOTE) The Xpert Xpress SARS-CoV-2/FLU/RSV plus assay is intended as an aid in the diagnosis of influenza from Nasopharyngeal swab specimens and should not be used as a sole basis  for treatment. Nasal washings and aspirates are unacceptable for Xpert Xpress SARS-CoV-2/FLU/RSV testing.  Fact Sheet for Patients: EntrepreneurPulse.com.au  Fact Sheet for Healthcare Providers: IncredibleEmployment.be  This test is not yet approved or cleared by the [place] FDA and has been authorized for detection and/or diagnosis of SARS-CoV-2 by FDA under an Emergency Use Authorization (EUA). This EUA will remain in effect (meaning this test can be used) for the duration of the COVID-19 declaration under Section 564(b)(1) of the Act, 21 U.S.C. section 360bbb-3(b)(1), unless the authorization is terminated or revoked.  Performed at [facility] [address] [place], [place] [address]      Labs: BNP (last 3 results) No results for input(s): BNP in the last 8760 hours. Basic Metabolic Panel: Recent Labs  Lab 11/07/20 1224  NA 136  K 3.7  CL 105  CO2 20*  GLUCOSE 189*  BUN 19  CREATININE 0.93  CALCIUM 9.6   Liver Function Tests: Recent Labs  Lab 11/07/20 1224  AST 21  ALT 14  ALKPHOS 80  BILITOT  0.9  PROT 7.6  ALBUMIN 3.9   No results for input(s): LIPASE, AMYLASE in the last 168 hours. No results for input(s): AMMONIA in the last 168 hours. CBC: Recent Labs  Lab 11/07/20 1224  WBC 8.1  NEUTROABS 6.1  HGB 13.6  HCT 40.3  MCV 91.6  PLT 207   Cardiac Enzymes: No results for input(s): CKTOTAL, CKMB, CKMBINDEX, TROPONINI in the last 168 hours. BNP: Invalid input(s): POCBNP CBG: No results for input(s): GLUCAP in the last 168 hours. D-Dimer No results for input(s): DDIMER in the last 72 hours. Hgb A1c No results for input(s): HGBA1C in the last 72 hours. Lipid Profile No results for input(s): CHOL, HDL, LDLCALC, TRIG, CHOLHDL, LDLDIRECT in the last 72 hours. Thyroid function studies No results for input(s): TSH, T4TOTAL, T3FREE, THYROIDAB in the last 72 hours.  Invalid input(s):  FREET3 Anemia work up No results for input(s): VITAMINB12, FOLATE, FERRITIN, TIBC, IRON, RETICCTPCT in the last 72 hours. Urinalysis    Component Value Date/Time   COLORURINE YELLOW (A) 11/07/2020 1344   APPEARANCEUR CLEAR (A) 11/07/2020 1344   LABSPEC 1.008 11/07/2020 1344   PHURINE 5.0 11/07/2020 1344   GLUCOSEU NEGATIVE 11/07/2020 1344   HGBUR NEGATIVE 11/07/2020 1344   BILIRUBINUR NEGATIVE 11/07/2020 1344   KETONESUR NEGATIVE 11/07/2020 1344   PROTEINUR 30 (A) 11/07/2020 1344   NITRITE NEGATIVE 11/07/2020 1344   LEUKOCYTESUR NEGATIVE 11/07/2020 1344   Sepsis Labs Invalid input(s): PROCALCITONIN,  WBC,  LACTICIDVEN Microbiology Recent Results (from the past 240 hour(s))  Resp Panel by RT-PCR (Flu A&B, Covid) Nasopharyngeal Swab     Status: None   Collection Time: 11/08/20  3:30 AM   Specimen: Nasopharyngeal Swab; Nasopharyngeal(NP) swabs in vial transport medium  Result Value Ref Range Status   SARS Coronavirus 2 by RT PCR NEGATIVE NEGATIVE Final    Comment: (NOTE) SARS-CoV-2 target nucleic acids are NOT DETECTED.  The SARS-CoV-2 RNA is generally detectable in upper respiratory specimens during the acute phase of infection. The lowest concentration of SARS-CoV-2 viral copies this assay can detect is 138 copies/mL. A negative result does not preclude SARS-Cov-2 infection and should not be used as the sole basis for treatment or other patient management decisions. A negative result may occur with  improper specimen collection/handling, submission of specimen other than nasopharyngeal swab, presence of viral mutation(s) within the areas targeted by this assay, and inadequate number of viral copies(<138 copies/mL). A negative result must be combined with clinical observations, patient history, and epidemiological information. The expected result is Negative.  Fact Sheet for Patients:  EntrepreneurPulse.com.au  Fact Sheet for Healthcare Providers:   IncredibleEmployment.be  This test is no t yet approved or cleared by the [place] FDA and  has been authorized for detection and/or diagnosis of SARS-CoV-2 by FDA under an Emergency Use Authorization (EUA). This EUA will remain  in effect (meaning this test can be used) for the duration of the COVID-19 declaration under Section 564(b)(1) of the Act, 21 U.S.C.section 360bbb-3(b)(1), unless the authorization is terminated  or revoked sooner.       Influenza A by PCR NEGATIVE NEGATIVE Final   Influenza B by PCR NEGATIVE NEGATIVE Final    Comment: (NOTE) The Xpert Xpress SARS-CoV-2/FLU/RSV plus assay is intended as an aid in the diagnosis of influenza from Nasopharyngeal swab specimens and should not be used as a sole basis for treatment. Nasal washings and aspirates are unacceptable for Xpert Xpress SARS-CoV-2/FLU/RSV testing.  Fact Sheet for Patients: EntrepreneurPulse.com.au  Fact Sheet  for Healthcare Providers: IncredibleEmployment.be  This test is not yet approved or cleared by the [place] and has been authorized for detection and/or diagnosis of SARS-CoV-2 by FDA under an Emergency Use Authorization (EUA). This EUA will remain in effect (meaning this test can be used) for the duration of the COVID-19 declaration under Section 564(b)(1) of the Act, 21 U.S.C. section 360bbb-3(b)(1), unless the authorization is terminated or revoked.  Performed at [facility], [address], [place], [place] [address]      Time coordinating discharge: Over 30 minutes  SIGNED:   Shawna Clamp, MD  [facility] 11/08/2020, 1:09 PM Pager   If 7PM-7AM, please contact night-coverage [email or web address]

## 2020-11-08 NOTE — Evaluation (Signed)
Physical Therapy Evaluation Patient Details Name: Katelyn Powers MRN: [id number] DOB: August 24, 1932 Today's Date: 11/08/2020   History of Present Illness  Katelyn Powers is a 87 y.o. female with medical history significant of CAD with NSTEMI NOV '21, Right carotid dz by doppler 09/17/20, HLD, memory loss. Patient seen by family at 8:30 PM 11/06/20 and was normal. 1/11 family noted right sided weakness, right facial droop and garbled speech. EMS activated and patient transported to [facility] for evaluation.  Clinical Impression  Patient received on stretcher. Agreeable to PT assessment. Daughter present in room. They are ready to go home. Patient perseverating slightly on getting lines removed. She is mod independent with bed mobility, transfers with min guard. Ambulated a few feet without AD and was unsteady, therefore remainder of walk was with RW and min guard. Patient much safer with RW. Encouraged patient to use at home. Patient will continue to benefit from skilled PT while here to improve safety, strength and functional independence.       Follow Up Recommendations Home health PT;Supervision/Assistance - 24 hour    Equipment Recommendations  None recommended by PT    Recommendations for Other Services       Precautions / Restrictions Precautions Precautions: Fall Restrictions Weight Bearing Restrictions: No      Mobility  Bed Mobility Overal bed mobility: Modified Independent Bed Mobility: Supine to Sit;Sit to Supine     Supine to sit: Modified independent (Device/Increase time) Sit to supine: Modified independent (Device/Increase time)   General bed mobility comments: Required increased time    Transfers Overall transfer level: Modified independent Equipment used: None Transfers: Sit to/from Stand Sit to Stand: Min guard;Min assist         General transfer comment: min guard for sit to stand  Ambulation/Gait Ambulation/Gait assistance: Min assist Gait Distance (Feet):  200 Feet Assistive device: Rolling walker (2 wheeled) Gait Pattern/deviations: Step-through pattern;Decreased stride length Gait velocity: decr   General Gait Details: Patient initially ambulated a few feet in room with unteadiness requiring min assist. Continued ambulation with RW and safety/balance improved requiring only min guard.  Stairs            Wheelchair Mobility    Modified Rankin (Stroke Patients Only)       Balance Overall balance assessment: Needs assistance Sitting-balance support: Feet supported Sitting balance-Leahy Scale: Good Sitting balance - Comments: Sitting EOB   Standing balance support: Bilateral upper extremity supported;During functional activity Standing balance-Leahy Scale: Fair Standing balance comment: reliant on RW at this time and min guard for safety with mobility                             Pertinent Vitals/Pain Pain Assessment: No/denies pain    Home Living Family/patient expects to be discharged to:: Private residence Living Arrangements: Children Available Help at Discharge: Family;Available 24 hours/day Type of Home: House Home Access: Stairs to enter Entrance Stairs-Rails: Right Entrance Stairs-Number of Steps: 1 Home Layout: One level Home Equipment: Walker - 2 wheels;Bedside commode;Grab bars - tub/shower Additional Comments: Pt has been staying with daughter since last hospital admission. Daughter stated that pt was receiving home health OT, but just recently was d/c. Daughter also stated that they obtained RW and BSC after last admission, but did not use as pt was MOD-I without DME.    Prior Function Level of Independence: Needs assistance   Gait / Transfers Assistance Needed: Per daughter, pt obtained a RW  following last hospital d/c, but is able to walk well without it. Also reports that functional mobility outside of the home is supervised by children  ADL's / Homemaking Assistance Needed: Independent with  ADLs following set-up from children        Hand Dominance        Extremity/Trunk Assessment   Upper Extremity Assessment Upper Extremity Assessment: Overall WFL for tasks assessed    Lower Extremity Assessment Lower Extremity Assessment: Generalized weakness    Cervical / Trunk Assessment Cervical / Trunk Assessment: Normal  Communication   Communication: No difficulties  Cognition Arousal/Alertness: Awake/alert Behavior During Therapy: WFL for tasks assessed/performed Overall Cognitive Status: History of cognitive impairments - at baseline                                 General Comments: Daughter reports that pt has baseline memory impairments, and that there are no apparent differences since admission      General Comments      Exercises     Assessment/Plan    PT Assessment Patient needs continued PT services  PT Problem List Decreased strength;Decreased mobility;Decreased balance;Decreased cognition;Decreased safety awareness       PT Treatment Interventions Therapeutic activities;Gait training;Therapeutic exercise;Patient/family education;Functional mobility training;Balance training;DME instruction    PT Goals (Current goals can be found in the Care Plan section)  Acute Rehab PT Goals Patient Stated Goal: to return home PT Goal Formulation: With patient/family Time For Goal Achievement: 11/15/20 Potential to Achieve Goals: Good    Frequency Min 2X/week   Barriers to discharge        Co-evaluation               AM-PAC PT "6 Clicks" Mobility  Outcome Measure Help needed turning from your back to your side while in a flat bed without using bedrails?: None Help needed moving from lying on your back to sitting on the side of a flat bed without using bedrails?: None Help needed moving to and from a bed to a chair (including a wheelchair)?: A Little Help needed standing up from a chair using your arms (e.g., wheelchair or bedside  chair)?: A Little Help needed to walk in hospital room?: A Little Help needed climbing 3-5 steps with a railing? : A Little 6 Click Score: 20    End of Session Equipment Utilized During Treatment: Gait belt Activity Tolerance: Patient tolerated treatment well Patient left: in bed;with call bell/phone within reach;with family/visitor present Nurse Communication: Mobility status PT Visit Diagnosis: Unsteadiness on feet (R26.81);Muscle weakness (generalized) (M62.81)    Time: 1761-6073 PT Time Calculation (min) (ACUTE ONLY): 21 min   Charges:   PT Evaluation $PT Eval Moderate Complexity: 1 Mod PT Treatments $Gait Training: 8-22 mins        Shaylene Paganelli, PT, GCS 11/08/20,12:15 PM

## 2020-11-08 NOTE — Evaluation (Signed)
Occupational Therapy Evaluation Patient Details Name: Katelyn Powers MRN: [id number] DOB: 03-14-1932 Today's Date: 11/08/2020    History of Present Illness Katelyn Powers is a 87 y.o. female with medical history significant of CAD with NSTEMI NOV '21, Right carotid dz by doppler 09/17/20, HLD, memory loss. Patient seen by family at 8:30 PM 11/06/20 and was normal. 1/11 family noted right sided weakness, right facial droop and garbled speech. EMS activated and patient transported to [facility] for evaluation.   Clinical Impression   Pt seen for OT evaluation on this date. Upon arrival to room pt awake and seated upright in bed with daughter present.  Since last hospital admission (Nov 2021), pt was living with daughter and receiving home health OT services. Per daughter, pt was mod-independent in ADLs, requiring occasional set-up assistance for dressing and bathing, and independent in functional mobility within the home. Per daughter, pt has access to a BSC and RW, but has not needed to use them. Pt currently requires SUPERVISION/MIN GUARD for ADLs and MIN A for functional mobility/toilet transfers without RW. Pt currently presents with decreased activity tolerance and balance, and would benefit from skilled OT services to maximize return to PLOF and minimize risk of future falls, injury, caregiver burden, and readmission. Discharge recommendation: home with home health OT and supervision/assistance 24/7.    Follow Up Recommendations  Home health OT;Supervision/Assistance - 24 hour    Equipment Recommendations  None recommended by OT       Precautions / Restrictions Precautions Precautions: Fall Restrictions Weight Bearing Restrictions: No      Mobility Bed Mobility Overal bed mobility: Needs Assistance Bed Mobility: Supine to Sit;Sit to Supine     Supine to sit: Supervision Sit to supine: Supervision   General bed mobility comments: Required increased time    Transfers Overall transfer  level: Needs assistance Equipment used: None Transfers: Sit to/from Stand Sit to Stand: Min guard;Min assist         General transfer comment: MIN GUARD for sit>stand at chair/bed, MIN A for toilet transfer d/t increase demands related to clothing management    Balance Overall balance assessment: Needs assistance Sitting-balance support: No upper extremity supported;Feet unsupported Sitting balance-Leahy Scale: Good Sitting balance - Comments: Sitting EOB   Standing balance support: No upper extremity supported;During functional activity Standing balance-Leahy Scale: Fair Standing balance comment: Walking bed<>toilet                           ADL either performed or assessed with clinical judgement   ADL Overall ADL's : Needs assistance/impaired     Grooming: Wash/dry hands;Set up;Min guard;Standing Grooming Details (indicate cue type and reason): Required MIN verbal cues for sequencing hand washing             Lower Body Dressing: Supervision/safety;Sitting/lateral leans Lower Body Dressing Details (indicate cue type and reason): To don shoes Toilet Transfer: Minimal assistance;Ambulation;Regular Toilet;Grab bars Toilet Transfer Details (indicate cue type and reason): Required MIN verbal cues for hand placement on grab bars, as pt was reaching for support from unsteady surfaces Toileting- Clothing Manipulation and Hygiene: Min guard;Sit to/from stand       Functional mobility during ADLs: Minimal assistance       Vision Baseline Vision/History: Wears glasses Wears Glasses: Reading only Patient Visual Report: No change from baseline              Pertinent Vitals/Pain Pain Assessment: No/denies pain  Extremity/Trunk Assessment Upper Extremity Assessment Upper Extremity Assessment: Overall WFL for tasks assessed   Lower Extremity Assessment Lower Extremity Assessment: Defer to PT evaluation   Cervical / Trunk Assessment Cervical /  Trunk Assessment: Normal   Communication Communication Communication: No difficulties   Cognition Arousal/Alertness: Awake/alert Behavior During Therapy: WFL for tasks assessed/performed Overall Cognitive Status: History of cognitive impairments - at baseline                                 General Comments: Daughter reports that pt has baseline memory impairments, and that there are no apparent differences since admission              Home Living Family/patient expects to be discharged to:: Private residence Living Arrangements: Children (Living with daughter since last hospital admission (Nov '21)) Available Help at Discharge: Family;Available 24 hours/day Type of Home: House Home Access: Stairs to enter [employer] of Steps: 1 Entrance Stairs-Rails: Right Home Layout: One level     Bathroom Shower/Tub: Teacher, early years/pre: Standard     Home Equipment: Environmental consultant - 2 wheels;Bedside commode;Grab bars - tub/shower   Additional Comments: Pt has been staying with daughter since last hospital admission. Daughter stated that pt was receiving home health OT, but just recently was d/c. Daughter also stated that they obtained RW and BSC after last admission, but did not use as pt was MOD-I without DME.      Prior Functioning/Environment Level of Independence: Needs assistance  Gait / Transfers Assistance Needed: Per daughter, pt obtained a RW following last hospital d/c, but is able to walk well without it. Also reports that functional mobility outside of the home is supervised by children ADL's / [place]: Independent with ADLs following set-up from children            OT Problem List: Decreased strength;Decreased activity tolerance;Impaired balance (sitting and/or standing);Decreased safety awareness      OT Treatment/Interventions: Self-care/ADL training;Therapeutic exercise;Neuromuscular education;Energy  conservation;DME and/or AE instruction;Therapeutic activities;Patient/family education;Balance training    OT Goals(Current goals can be found in the care plan section) Acute Rehab OT Goals Patient Stated Goal: to go home OT Goal Formulation: With patient/family Time For Goal Achievement: 11/22/20 Potential to Achieve Goals: Good  OT Frequency: Min 1X/week    AM-PAC OT "6 Clicks" Daily Activity     Outcome Measure Help from another person eating meals?: None Help from another person taking care of personal grooming?: A Little Help from another person toileting, which includes using toliet, bedpan, or urinal?: A Little Help from another person bathing (including washing, rinsing, drying)?: A Little Help from another person to put on and taking off regular upper body clothing?: None Help from another person to put on and taking off regular lower body clothing?: A Little 6 Click Score: 20   End of Session Equipment Utilized During Treatment: Gait belt Nurse Communication: Mobility status  Activity Tolerance: Patient tolerated treatment well Patient left: in bed;with call bell/phone within reach;with family/visitor present  OT Visit Diagnosis: Unsteadiness on feet (R26.81)                Time: 7510-2585 OT Time Calculation (min): 30 min Charges:  OT Evaluation $OT Eval Moderate Complexity: 1 Mod OT Treatments $Self Care/Home Management : 8-22 mins $Therapeutic Activity: 8-22 mins  Fredirick Maudlin, OTR/L [place]

## 2020-11-08 NOTE — Discharge Instructions (Signed)
Advised to follow-up with primary care physician in 1 week. Advised to follow-up with neurology for further work-up. Patient was admitted for TIA work-up stroke has been ruled out. Advised to take aspirin and Plavix.

## 2020-11-08 NOTE — Evaluation (Signed)
Clinical/Bedside Swallow Evaluation Patient Details  Name: Katelyn Powers MRN: [id number] Date of Birth: 11/01/1931  Today's Date: 11/08/2020 Time: SLP Start Time (ACUTE ONLY): 0740 SLP Stop Time (ACUTE ONLY): 0800 SLP Time Calculation (min) (ACUTE ONLY): 20 min  Past Medical History:  Past Medical History:  Diagnosis Date  . Cataract   . Gout   . Hyperlipidemia   . Hypertension   . Memory loss   . NSTEMI (non-ST elevated myocardial infarction) ([place]) 08/2020  . Right-sided carotid artery disease ([place]) 09/17/2020   carotid doppler wth >60% occlusion   Past Surgical History:  Past Surgical History:  Procedure Laterality Date  . MELANOMA EXCISION Right 1972   level 2  . 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 female with medical history significant of CAD with NSTEMI NOv '21, Right carotid dz by doppler 09/17/20, HLD, memory loss. Patient seen by family at 8:30 PM 11/06/20 and was normal. This AM family noted right sided weakness, right facial droop and garbled speech. EMS activated and patient transported to [facility] for evaluaton. MRI was negative for any acute intracranial process but did reveal remote left parietal insult with sequela of cortical laminar necrosis and remote right corona radiata insult as well as moderate chronic microvascular ischemic changes.   Assessment / Plan / Recommendation Clinical Impression  Pt presents with grossly adequate oropharyngeal abilities when consuming regular, puree and thin liquids via straw. As such, she is at reduced risk of aspiration when following general aspiration precautions. [name] daughter was present during evaluation and stated that pt was reclined when she got "strangled" on water. Education provided on general aspiration precautions with daughter voicing understanding. At this time recommend return to regular diet with thin liquids via straw, medicine whole with thin liquids.  ST intervention is not indicated at this time. SLP Visit Diagnosis: Dysphagia, unspecified (R13.10)    Aspiration Risk  No limitations    Diet Recommendation Regular;Thin liquid   Liquid Administration via: Cup;Straw Medication Administration: Whole meds with liquid Supervision: Patient able to self feed Compensations: Minimize environmental distractions;Slow rate;Small sips/bites Postural Changes: Seated upright at 90 degrees    Other  Recommendations Oral Care Recommendations: Oral care BID   Follow up Recommendations None        Swallow Study   General Date of Onset: 11/07/20 HPI: Katelyn Powers is a 87 y.o. female with medical history significant of CAD with NSTEMI NOv '21, Right carotid dz by doppler 09/17/20, HLD, memory loss. Patient seen by family at 8:30 PM 11/06/20 and was normal. This AM family noted right sided weakness, right facial droop and garbled speech. EMS activated and patient transported to [facility] for evaluaton. MRI was negative for any acute intracranial process but did reveal remote left parietal insult with sequela of cortical laminar necrosis and remote right corona radiata insult as well as moderate chronic microvascular ischemic changes. Type of Study: Bedside Swallow Evaluation Previous Swallow Assessment: none in chart Diet Prior to this Study: NPO Temperature Spikes Noted: No Respiratory Status: Room air History of Recent Intubation: No Behavior/Cognition: Alert;Cooperative;Pleasant mood;Confused Oral Cavity Assessment: Within Functional Limits Oral Care Completed by SLP: No Oral Cavity - Dentition: Adequate natural dentition Vision: Functional for self-feeding Self-Feeding Abilities: Able to feed self Patient Positioning: Upright in bed Baseline Vocal Quality: Normal Volitional Cough: Strong Volitional Swallow: Able to elicit    Oral/Motor/Sensory Function Overall Oral Motor/Sensory Function: Within  functional limits   Ice Chips Ice chips: Not  tested   Thin Liquid Thin Liquid: Within functional limits Presentation: Self Fed;Straw    Nectar Thick Nectar Thick Liquid: Not tested   Honey Thick Honey Thick Liquid: Not tested   Puree Puree: Within functional limits Presentation: Spoon   Solid     Solid: Within functional limits Presentation: Self Fed     Katelyn Powers B. Rutherford Nail M.S., CCC-SLP, [place] [facility]ologist Rehabilitation Services Office [phone]  Katelyn Powers Rutherford Nail 11/08/2020,12:43 PM

## 2020-11-08 NOTE — ED Notes (Addendum)
SLP at bedside.

## 2020-11-08 NOTE — ED Notes (Signed)
Pt calm , collective , denied pain or sob . Discharge intructions reviewed with pt and daughter

## 2020-11-08 NOTE — ED Notes (Signed)
Pt calm , collective ,

## 2020-11-08 NOTE — ED Notes (Signed)
OT at bedside working with pt.

## 2020-11-08 NOTE — ED Notes (Signed)
Pt given breakfast tray at this time. 

## 2020-11-08 NOTE — ED Notes (Signed)
Admit MD messaged regarding pt's status and possible d/c today per family

## 2020-11-10 DIAGNOSIS — E785 Hyperlipidemia, unspecified: Secondary | ICD-10-CM | POA: Diagnosis not present

## 2020-11-10 DIAGNOSIS — I10 Essential (primary) hypertension: Secondary | ICD-10-CM | POA: Diagnosis not present

## 2020-11-10 DIAGNOSIS — I6521 Occlusion and stenosis of right carotid artery: Secondary | ICD-10-CM | POA: Diagnosis not present

## 2020-11-10 DIAGNOSIS — R2681 Unsteadiness on feet: Secondary | ICD-10-CM | POA: Diagnosis not present

## 2020-11-10 DIAGNOSIS — I214 Non-ST elevation (NSTEMI) myocardial infarction: Secondary | ICD-10-CM | POA: Diagnosis not present

## 2020-11-10 DIAGNOSIS — M6281 Muscle weakness (generalized): Secondary | ICD-10-CM | POA: Diagnosis not present

## 2020-11-15 DIAGNOSIS — I6521 Occlusion and stenosis of right carotid artery: Secondary | ICD-10-CM | POA: Diagnosis not present

## 2020-11-15 DIAGNOSIS — M6281 Muscle weakness (generalized): Secondary | ICD-10-CM | POA: Diagnosis not present

## 2020-11-15 DIAGNOSIS — R2681 Unsteadiness on feet: Secondary | ICD-10-CM | POA: Diagnosis not present

## 2020-11-15 DIAGNOSIS — I10 Essential (primary) hypertension: Secondary | ICD-10-CM | POA: Diagnosis not present

## 2020-11-15 DIAGNOSIS — E785 Hyperlipidemia, unspecified: Secondary | ICD-10-CM | POA: Diagnosis not present

## 2020-11-15 DIAGNOSIS — I214 Non-ST elevation (NSTEMI) myocardial infarction: Secondary | ICD-10-CM | POA: Diagnosis not present

## 2020-11-16 DIAGNOSIS — E785 Hyperlipidemia, unspecified: Secondary | ICD-10-CM | POA: Diagnosis not present

## 2020-11-16 DIAGNOSIS — I10 Essential (primary) hypertension: Secondary | ICD-10-CM | POA: Diagnosis not present

## 2020-11-16 DIAGNOSIS — I6521 Occlusion and stenosis of right carotid artery: Secondary | ICD-10-CM | POA: Diagnosis not present

## 2020-11-16 DIAGNOSIS — M6281 Muscle weakness (generalized): Secondary | ICD-10-CM | POA: Diagnosis not present

## 2020-11-16 DIAGNOSIS — R2681 Unsteadiness on feet: Secondary | ICD-10-CM | POA: Diagnosis not present

## 2020-11-16 DIAGNOSIS — I214 Non-ST elevation (NSTEMI) myocardial infarction: Secondary | ICD-10-CM | POA: Diagnosis not present

## 2020-11-21 DIAGNOSIS — I214 Non-ST elevation (NSTEMI) myocardial infarction: Secondary | ICD-10-CM | POA: Diagnosis not present

## 2020-11-21 DIAGNOSIS — I6521 Occlusion and stenosis of right carotid artery: Secondary | ICD-10-CM | POA: Diagnosis not present

## 2020-11-21 DIAGNOSIS — E785 Hyperlipidemia, unspecified: Secondary | ICD-10-CM | POA: Diagnosis not present

## 2020-11-21 DIAGNOSIS — I10 Essential (primary) hypertension: Secondary | ICD-10-CM | POA: Diagnosis not present

## 2020-11-21 DIAGNOSIS — R2681 Unsteadiness on feet: Secondary | ICD-10-CM | POA: Diagnosis not present

## 2020-11-21 DIAGNOSIS — M6281 Muscle weakness (generalized): Secondary | ICD-10-CM | POA: Diagnosis not present

## 2020-11-23 DIAGNOSIS — I6521 Occlusion and stenosis of right carotid artery: Secondary | ICD-10-CM | POA: Diagnosis not present

## 2020-11-23 DIAGNOSIS — M6281 Muscle weakness (generalized): Secondary | ICD-10-CM | POA: Diagnosis not present

## 2020-11-23 DIAGNOSIS — E785 Hyperlipidemia, unspecified: Secondary | ICD-10-CM | POA: Diagnosis not present

## 2020-11-23 DIAGNOSIS — I214 Non-ST elevation (NSTEMI) myocardial infarction: Secondary | ICD-10-CM | POA: Diagnosis not present

## 2020-11-23 DIAGNOSIS — I10 Essential (primary) hypertension: Secondary | ICD-10-CM | POA: Diagnosis not present

## 2020-11-23 DIAGNOSIS — R2681 Unsteadiness on feet: Secondary | ICD-10-CM | POA: Diagnosis not present

## 2020-11-28 DIAGNOSIS — I6521 Occlusion and stenosis of right carotid artery: Secondary | ICD-10-CM | POA: Diagnosis not present

## 2020-11-28 DIAGNOSIS — I10 Essential (primary) hypertension: Secondary | ICD-10-CM | POA: Diagnosis not present

## 2020-11-28 DIAGNOSIS — R2681 Unsteadiness on feet: Secondary | ICD-10-CM | POA: Diagnosis not present

## 2020-11-28 DIAGNOSIS — E785 Hyperlipidemia, unspecified: Secondary | ICD-10-CM | POA: Diagnosis not present

## 2020-11-28 DIAGNOSIS — M6281 Muscle weakness (generalized): Secondary | ICD-10-CM | POA: Diagnosis not present

## 2020-11-28 DIAGNOSIS — I214 Non-ST elevation (NSTEMI) myocardial infarction: Secondary | ICD-10-CM | POA: Diagnosis not present

## 2020-11-29 DIAGNOSIS — I10 Essential (primary) hypertension: Secondary | ICD-10-CM | POA: Diagnosis not present

## 2020-11-29 DIAGNOSIS — E785 Hyperlipidemia, unspecified: Secondary | ICD-10-CM | POA: Diagnosis not present

## 2020-11-29 DIAGNOSIS — R2681 Unsteadiness on feet: Secondary | ICD-10-CM | POA: Diagnosis not present

## 2020-11-29 DIAGNOSIS — M6281 Muscle weakness (generalized): Secondary | ICD-10-CM | POA: Diagnosis not present

## 2020-11-29 DIAGNOSIS — I6521 Occlusion and stenosis of right carotid artery: Secondary | ICD-10-CM | POA: Diagnosis not present

## 2020-11-29 DIAGNOSIS — I214 Non-ST elevation (NSTEMI) myocardial infarction: Secondary | ICD-10-CM | POA: Diagnosis not present

## 2020-11-30 DIAGNOSIS — I214 Non-ST elevation (NSTEMI) myocardial infarction: Secondary | ICD-10-CM | POA: Diagnosis not present

## 2020-11-30 DIAGNOSIS — I6521 Occlusion and stenosis of right carotid artery: Secondary | ICD-10-CM | POA: Diagnosis not present

## 2020-11-30 DIAGNOSIS — M6281 Muscle weakness (generalized): Secondary | ICD-10-CM | POA: Diagnosis not present

## 2020-11-30 DIAGNOSIS — I10 Essential (primary) hypertension: Secondary | ICD-10-CM | POA: Diagnosis not present

## 2020-11-30 DIAGNOSIS — R2681 Unsteadiness on feet: Secondary | ICD-10-CM | POA: Diagnosis not present

## 2020-11-30 DIAGNOSIS — E785 Hyperlipidemia, unspecified: Secondary | ICD-10-CM | POA: Diagnosis not present

## 2020-12-01 DIAGNOSIS — E785 Hyperlipidemia, unspecified: Secondary | ICD-10-CM | POA: Diagnosis not present

## 2020-12-01 DIAGNOSIS — I6521 Occlusion and stenosis of right carotid artery: Secondary | ICD-10-CM | POA: Diagnosis not present

## 2020-12-01 DIAGNOSIS — I214 Non-ST elevation (NSTEMI) myocardial infarction: Secondary | ICD-10-CM | POA: Diagnosis not present

## 2020-12-01 DIAGNOSIS — I10 Essential (primary) hypertension: Secondary | ICD-10-CM | POA: Diagnosis not present

## 2020-12-01 DIAGNOSIS — M6281 Muscle weakness (generalized): Secondary | ICD-10-CM | POA: Diagnosis not present

## 2020-12-01 DIAGNOSIS — R2681 Unsteadiness on feet: Secondary | ICD-10-CM | POA: Diagnosis not present

## 2020-12-05 DIAGNOSIS — I6521 Occlusion and stenosis of right carotid artery: Secondary | ICD-10-CM | POA: Diagnosis not present

## 2020-12-05 DIAGNOSIS — E785 Hyperlipidemia, unspecified: Secondary | ICD-10-CM | POA: Diagnosis not present

## 2020-12-05 DIAGNOSIS — I214 Non-ST elevation (NSTEMI) myocardial infarction: Secondary | ICD-10-CM | POA: Diagnosis not present

## 2020-12-05 DIAGNOSIS — I10 Essential (primary) hypertension: Secondary | ICD-10-CM | POA: Diagnosis not present

## 2020-12-05 DIAGNOSIS — M6281 Muscle weakness (generalized): Secondary | ICD-10-CM | POA: Diagnosis not present

## 2020-12-05 DIAGNOSIS — R2681 Unsteadiness on feet: Secondary | ICD-10-CM | POA: Diagnosis not present

## 2020-12-09 DIAGNOSIS — M6281 Muscle weakness (generalized): Secondary | ICD-10-CM | POA: Diagnosis not present

## 2020-12-09 DIAGNOSIS — I10 Essential (primary) hypertension: Secondary | ICD-10-CM | POA: Diagnosis not present

## 2020-12-09 DIAGNOSIS — I6521 Occlusion and stenosis of right carotid artery: Secondary | ICD-10-CM | POA: Diagnosis not present

## 2020-12-09 DIAGNOSIS — R2681 Unsteadiness on feet: Secondary | ICD-10-CM | POA: Diagnosis not present

## 2020-12-09 DIAGNOSIS — E785 Hyperlipidemia, unspecified: Secondary | ICD-10-CM | POA: Diagnosis not present

## 2020-12-09 DIAGNOSIS — I214 Non-ST elevation (NSTEMI) myocardial infarction: Secondary | ICD-10-CM | POA: Diagnosis not present

## 2020-12-12 DIAGNOSIS — E785 Hyperlipidemia, unspecified: Secondary | ICD-10-CM | POA: Diagnosis not present

## 2020-12-12 DIAGNOSIS — I214 Non-ST elevation (NSTEMI) myocardial infarction: Secondary | ICD-10-CM | POA: Diagnosis not present

## 2020-12-12 DIAGNOSIS — I10 Essential (primary) hypertension: Secondary | ICD-10-CM | POA: Diagnosis not present

## 2020-12-12 DIAGNOSIS — R2681 Unsteadiness on feet: Secondary | ICD-10-CM | POA: Diagnosis not present

## 2020-12-12 DIAGNOSIS — M6281 Muscle weakness (generalized): Secondary | ICD-10-CM | POA: Diagnosis not present

## 2020-12-12 DIAGNOSIS — I6521 Occlusion and stenosis of right carotid artery: Secondary | ICD-10-CM | POA: Diagnosis not present

## 2020-12-13 DIAGNOSIS — M6281 Muscle weakness (generalized): Secondary | ICD-10-CM | POA: Diagnosis not present

## 2020-12-13 DIAGNOSIS — I10 Essential (primary) hypertension: Secondary | ICD-10-CM | POA: Diagnosis not present

## 2020-12-13 DIAGNOSIS — I214 Non-ST elevation (NSTEMI) myocardial infarction: Secondary | ICD-10-CM | POA: Diagnosis not present

## 2020-12-13 DIAGNOSIS — R2681 Unsteadiness on feet: Secondary | ICD-10-CM | POA: Diagnosis not present

## 2020-12-13 DIAGNOSIS — E785 Hyperlipidemia, unspecified: Secondary | ICD-10-CM | POA: Diagnosis not present

## 2020-12-13 DIAGNOSIS — I6521 Occlusion and stenosis of right carotid artery: Secondary | ICD-10-CM | POA: Diagnosis not present

## 2020-12-19 DIAGNOSIS — I214 Non-ST elevation (NSTEMI) myocardial infarction: Secondary | ICD-10-CM | POA: Diagnosis not present

## 2020-12-19 DIAGNOSIS — E785 Hyperlipidemia, unspecified: Secondary | ICD-10-CM | POA: Diagnosis not present

## 2020-12-19 DIAGNOSIS — I6521 Occlusion and stenosis of right carotid artery: Secondary | ICD-10-CM | POA: Diagnosis not present

## 2020-12-19 DIAGNOSIS — I10 Essential (primary) hypertension: Secondary | ICD-10-CM | POA: Diagnosis not present

## 2020-12-19 DIAGNOSIS — R2681 Unsteadiness on feet: Secondary | ICD-10-CM | POA: Diagnosis not present

## 2020-12-19 DIAGNOSIS — M6281 Muscle weakness (generalized): Secondary | ICD-10-CM | POA: Diagnosis not present

## 2020-12-28 DIAGNOSIS — I6521 Occlusion and stenosis of right carotid artery: Secondary | ICD-10-CM | POA: Diagnosis not present

## 2020-12-28 DIAGNOSIS — I10 Essential (primary) hypertension: Secondary | ICD-10-CM | POA: Diagnosis not present

## 2020-12-28 DIAGNOSIS — I214 Non-ST elevation (NSTEMI) myocardial infarction: Secondary | ICD-10-CM | POA: Diagnosis not present

## 2020-12-28 DIAGNOSIS — E785 Hyperlipidemia, unspecified: Secondary | ICD-10-CM | POA: Diagnosis not present

## 2020-12-28 DIAGNOSIS — M6281 Muscle weakness (generalized): Secondary | ICD-10-CM | POA: Diagnosis not present

## 2020-12-28 DIAGNOSIS — R2681 Unsteadiness on feet: Secondary | ICD-10-CM | POA: Diagnosis not present

## 2021-01-03 DIAGNOSIS — M6281 Muscle weakness (generalized): Secondary | ICD-10-CM | POA: Diagnosis not present

## 2021-01-03 DIAGNOSIS — R2681 Unsteadiness on feet: Secondary | ICD-10-CM | POA: Diagnosis not present

## 2021-01-03 DIAGNOSIS — I10 Essential (primary) hypertension: Secondary | ICD-10-CM | POA: Diagnosis not present

## 2021-01-03 DIAGNOSIS — E785 Hyperlipidemia, unspecified: Secondary | ICD-10-CM | POA: Diagnosis not present

## 2021-01-03 DIAGNOSIS — I6521 Occlusion and stenosis of right carotid artery: Secondary | ICD-10-CM | POA: Diagnosis not present

## 2021-01-03 DIAGNOSIS — I214 Non-ST elevation (NSTEMI) myocardial infarction: Secondary | ICD-10-CM | POA: Diagnosis not present

## 2021-01-26 ENCOUNTER — Ambulatory Visit: Payer: Medicare Other | Admitting: Interventional Cardiology

## 2021-02-12 DIAGNOSIS — Z9181 History of falling: Secondary | ICD-10-CM | POA: Diagnosis not present

## 2021-02-12 DIAGNOSIS — E785 Hyperlipidemia, unspecified: Secondary | ICD-10-CM | POA: Diagnosis not present

## 2021-02-12 DIAGNOSIS — Z0000 Encounter for general adult medical examination without abnormal findings: Secondary | ICD-10-CM | POA: Diagnosis not present

## 2021-02-13 DIAGNOSIS — E785 Hyperlipidemia, unspecified: Secondary | ICD-10-CM | POA: Diagnosis not present

## 2021-02-13 DIAGNOSIS — I6521 Occlusion and stenosis of right carotid artery: Secondary | ICD-10-CM | POA: Diagnosis not present

## 2021-02-13 DIAGNOSIS — R2681 Unsteadiness on feet: Secondary | ICD-10-CM | POA: Diagnosis not present

## 2021-02-13 DIAGNOSIS — I252 Old myocardial infarction: Secondary | ICD-10-CM | POA: Diagnosis not present

## 2021-02-13 DIAGNOSIS — I10 Essential (primary) hypertension: Secondary | ICD-10-CM | POA: Diagnosis not present

## 2021-02-13 DIAGNOSIS — G301 Alzheimer's disease with late onset: Secondary | ICD-10-CM | POA: Diagnosis not present

## 2021-02-21 ENCOUNTER — Ambulatory Visit: Payer: Medicare Other | Admitting: Student

## 2021-02-22 ENCOUNTER — Telehealth (INDEPENDENT_AMBULATORY_CARE_PROVIDER_SITE_OTHER): Payer: Medicare Other | Admitting: Neurology

## 2021-02-22 ENCOUNTER — Encounter: Payer: Self-pay | Admitting: Neurology

## 2021-02-22 ENCOUNTER — Telehealth: Payer: Self-pay | Admitting: *Deleted

## 2021-02-22 DIAGNOSIS — F0280 Dementia in other diseases classified elsewhere without behavioral disturbance: Secondary | ICD-10-CM | POA: Diagnosis not present

## 2021-02-22 DIAGNOSIS — G4710 Hypersomnia, unspecified: Secondary | ICD-10-CM | POA: Diagnosis not present

## 2021-02-22 DIAGNOSIS — G301 Alzheimer's disease with late onset: Secondary | ICD-10-CM | POA: Diagnosis not present

## 2021-02-22 DIAGNOSIS — I669 Occlusion and stenosis of unspecified cerebral artery: Secondary | ICD-10-CM | POA: Diagnosis not present

## 2021-02-22 NOTE — Telephone Encounter (Signed)
Called and spoke with daughter, Katelyn Powers. Updated med list, pharmacy, allergy list on file. Explained on to log into mychart to complete VV.

## 2021-02-22 NOTE — Progress Notes (Signed)
[facility]  PATIENT: Katelyn Powers DOB: 1932/03/24  REFERRING DOCTOR OR PCP:  Dr. Vanetta Shawl SOURCE: patient, notes from PCP and ED,  Imaging and lab reports, MRi and CT images on PACS personally reviewed.    _________________________________   HISTORICAL  CHIEF COMPLAINT:  Chief Complaint  Patient presents with  . Dementia    HISTORY OF PRESENT ILLNESS:  Katelyn Powers is a 87 y.o. woman with Alzheimer's disease without behavioral issues and gait issues  Virtual Visit via Video Note I connected with [name] on 02/22/21 at 10:00 AM EDT by a video enabled telemedicine application and verified that I am speaking with the correct person.  I discussed the limitations of evaluation and management by telemedicine and the availability of in person appointments. The patient expressed understanding and agreed to proceed.    The patient was in her home.  I was in my office.  Update 02/21/2021: Her daughter is noting continued issues with her mom's STM.   She does simple tasks around the house but not more complex ones.   She stopped driving.  She lives with her daughter.   They may have her return to her house for part of the day if they can find more help.   She eats well and sleeps well.   She wakes up once tio use the bathroom but returns to bed.  She naps some.   She is fairly active doing activities outdoors and doing things with her family.   The daughter does not note depression for the most part but she has had a few crying spells.    She stopped donepezil due to side effects.    She is now on Gingko Biloba, Omega 3 FA, tumeric and melatonin at night.   She often wakes up at 3 am but is getting falls asleep at 9-10 pm and gets 5-6 solid hours of sleep.  She naps multiple sort naps during the day (adds up to 2 hours/day)  She snores but has no gasping/OSA.   She does not have any behavioral issues..      Her gait is about the same .  She notes more pain in her left hip.    She is active and is doing more gardening with the improved weather.     In January, she had an episode of right-sided weakness and reduced gait if she presented to the emergency room.  She was started on aspirin and Plavix.  Since no acute findings were seen on the MRIs, she was felt to have had a TIA.  Personally reviewed MRIs from 11/07/2020.  The brain shows moderate generalized cortical atrophy, most pronounced in the medial temporal lobes.  There is a small chronic left parietal stroke a chronic right frontal stroke, a left medial cerebellar hemisphere lacunar and scattered T2/FLAIR hyperintense foci consistent with chronic microvascular ischemic change.  There were no acute ischemic findings.  MR angiogram 11/07/2020 showed left P1 severe stenosis or occlusion, right P2 stenosis and right V4 stenosis.   Montreal Cognitive Assessment  10/27/2019 10/27/2019 07/27/2018  Visuospatial/ Executive (0/5) 3 3 3   Naming (0/3) 2 2 2   Attention: Read list of digits (0/2) 2 2 1   Attention: Read list of letters (0/1) 1 1 1   Attention: Serial 7 subtraction starting at 100 (0/3) 1 1 3   Language: Repeat phrase (0/2) 2 2 1   Language : Fluency (0/1) 1 1 1   Abstraction (0/2) 0 0 2  Delayed Recall (  0/5) 0 0 0  Orientation (0/6) 5 5 5   Total 17 17 19   Adjusted Score (based on education) 18 - 20     From initial consult 07/27/2018 She is an 87 year old woman who has had some memory difficulty or a year.   Her family notes more difficulty than she does.    She states she has not lost many items though her daughter has noted issues with that.     She is driving and does not have any issues.     She states she does not leave items on but her daughter notes the stove has been left on multiple times.   She is good about locking up at night.   She reads some daily.   She gets together with others at church functions.   Her daughter notes she has more trouble coming up with words and sometimes gets family members  (grandchildren) mixed up.   There is mild confusion.    She worsened after her injury in May.    She fluctuates some.       She and daughter deny depression.  She is not apathetic.    She has normal B12 and TSH.  Sleep is poor.  She gets 4-5 hours sleep many nights.    She has sleep onset > sleep maintenance insomnia but wakes up around 3 am most nights and uses the bathroom.  She has only 1 x nocturia.      She snores but no snorts, gasps or pauses.    She falls asleep with reading.  She also has difficulty with gait and balance for the past 9 months.  She had a bad fall in May and broke 4 ribs on the right.   She was in a theater (lights dimmed) and she missed a step   Imaging: CT scan dated 06/13/2018 and MRI of the brain dated 12/31/2017.  The MRI of the brain shows moderate generalized cortical atrophy and chronic microvascular ischemic changes.  There were no acute findings.  The 7th/8th nerve complexes appeared normal.  The CT scan confirmed the MRI findings of a few months earlier.    Vitamin B12 and TSH were normal 06/13/2018.  Glucose was moderately elevated though sample was probably not fasting.  Bilirubin was slightly elevated.  The carotid Doppler study 07/22/2018 showed 60 to 79% stenosis on the right and no significant stenosis on the left.  MRI of the brain 11/07/2020 shows moderate generalized cortical atrophy, most pronounced in the medial temporal lobes.  There is a small chronic left parietal stroke a chronic right frontal stroke, a left medial cerebellar hemisphere lacunar and scattered T2/FLAIR hyperintense foci consistent with chronic microvascular ischemic change.  There were no acute ischemic findings.  MR angiogram 11/07/2020 showed left P1 severe stenosis or occlusion, right P2 stenosis and right V4 stenosis   REVIEW OF SYSTEMS: Constitutional: No fevers, chills, sweats, or change in appetite Eyes: No visual changes, double vision, eye pain Ear, nose and throat: No hearing loss,  ear pain, nasal congestion, sore throat Cardiovascular: No chest pain, palpitations Respiratory: No shortness of breath at rest or with exertion.   No wheezes GastrointestinaI: No nausea, vomiting, diarrhea, abdominal pain, fecal incontinence Genitourinary: No dysuria, urinary retention or frequency.  No nocturia. Musculoskeletal: No neck pain, back pain Integumentary: No rash, pruritus, skin lesions Neurological: as above Psychiatric: No depression at this time.  No anxiety. Endocrine: No palpitations, diaphoresis, change in appetite, change in weigh or increased  thirst Hematologic/Lymphatic: No anemia, purpura, petechiae. Allergic/Immunologic: No itchy/runny eyes, nasal congestion, recent allergic reactions, rashes  ALLERGIES: No Known Allergies  HOME MEDICATIONS:  Current Outpatient Medications:  .  amLODipine (NORVASC) 5 MG tablet, Take 1 tablet (5 mg total) by mouth daily., Disp: , Rfl:  .  aspirin 81 MG tablet, Take 81 mg by mouth daily., Disp: , Rfl:  .  Cholecalciferol (VITAMIN D3 PO), Take 1 Dose by mouth daily. Vit D3 (4,000U), Vit A, K2+ 4 immune boosters, Disp: , Rfl:  .  clopidogrel (PLAVIX) 75 MG tablet, Take 1 tablet (75 mg total) by mouth daily., Disp: 30 tablet, Rfl: 0 .  Ginkgo Biloba 120 MG CAPS, Take 550 mg by mouth in the morning and at bedtime., Disp: , Rfl:  .  losartan (COZAAR) 50 MG tablet, Take 1 tablet (50 mg total) by mouth daily., Disp: , Rfl:  .  Melatonin 10 MG TABS, Take 10 mg by mouth at bedtime. , Disp: , Rfl:  .  nitroGLYCERIN (NITROSTAT) 0.4 MG SL tablet, Place 1 tablet (0.4 mg total) under the tongue every 5 (five) minutes as needed for chest pain., Disp: 25 tablet, Rfl: 2 .  Omega-3-acid Eth &Multivit/Min (OMEGA-3 RX COMPLETE PO), Take by mouth., Disp: , Rfl:  .  UNABLE TO FIND, Take 2 tablets by mouth daily. Med Name: Living-good daily: Healthy Cholesterol Support, Disp: , Rfl:   PAST MEDICAL HISTORY: Past Medical History:  Diagnosis Date  .  Cataract   . Gout   . Hyperlipidemia   . Hypertension   . Memory loss   . NSTEMI (non-ST elevated myocardial infarction) ([place]) 08/2020  . Right-sided carotid artery disease ([place]) 09/17/2020   carotid doppler wth >60% occlusion    PAST SURGICAL HISTORY: Past Surgical History:  Procedure Laterality Date  . MELANOMA EXCISION Right 1972   level 2  . 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 Colon cancer Sister 1  . Colon polyps Brother     SOCIAL HISTORY:  Social History   Socioeconomic History  . Marital status: Widowed    Spouse name: Not on file  . Number of children: Not on file  . Years of education: Not on file  . Highest education level: Not on file  Occupational History  . Not on file  Tobacco Use  . Smoking status: Former Research scientist (life sciences)  . Smokeless tobacco: Never Used  Vaping Use  . Vaping Use: Never used  Substance and Sexual Activity  . Alcohol use: No    Alcohol/week: 0.0 standard drinks  . Drug use: No  . Sexual activity: Not on file  Other Topics Concern  . Not on file  Social History Narrative  . Not on file   Social Determinants of Health   Financial Resource Strain: Not on file  Food Insecurity: Not on file  Transportation Needs: Not on file  Physical Activity: Not on file  Stress: Not on file  Social Connections: Not on file  Intimate Partner Violence: Not on file     PHYSICAL EXAM  There were no vitals filed for this visit.  There is no height or weight on file to calculate BMI.   General: The patient is well-developed and well-nourished and in no acute distress  Neurologic Exam  Mental status: The patient is alert and oriented x 2 at the time of the examination.  She has  reduced short-term memory (0/3; 2/3 with hints) and executive function. Reduced attention (100-?; WORLD-DORLD).    This visit was virtual and a full MMSE could not be  performed.   Speech is normal.  Cranial nerves: Extraocular movements are full.  Facial symmetry is present.  Facial strength appears normal.  She has reduced hearing.  Motor: Arm strength appears normal.  Sensory: Could not be determined.  Coordination: Cerebellar testing reveals good finger-nose-finger and heel-to-shin bilaterally.      DIAGNOSTIC DATA (LABS, IMAGING, TESTING) - I reviewed patient records, labs, notes, testing and imaging myself where available.  Lab Results  Component Value Date   WBC 8.1 11/07/2020   HGB 13.6 11/07/2020   HCT 40.3 11/07/2020   MCV 91.6 11/07/2020   PLT 207 11/07/2020      Component Value Date/Time   NA 136 11/07/2020 1224   K 3.7 11/07/2020 1224   CL 105 11/07/2020 1224   CO2 20 (L) 11/07/2020 1224   GLUCOSE 189 (H) 11/07/2020 1224   BUN 19 11/07/2020 1224   CREATININE 0.93 11/07/2020 1224   CALCIUM 9.6 11/07/2020 1224   PROT 7.6 11/07/2020 1224   ALBUMIN 3.9 11/07/2020 1224   AST 21 11/07/2020 1224   ALT 14 11/07/2020 1224   ALKPHOS 80 11/07/2020 1224   BILITOT 0.9 11/07/2020 1224   GFRNONAA 60 (L) 11/07/2020 1224   GFRAA 56 (L) 05/10/2020 0150   Lab Results  Component Value Date   CHOL 234 (H) 09/04/2020   HDL 52 09/04/2020   LDLCALC 138 (H) 09/04/2020   TRIG 220 (H) 09/04/2020   CHOLHDL 4.5 09/04/2020   Lab Results  Component Value Date   HGBA1C 5.8 (H) 09/04/2020   Lab Results  Component Value Date   [id number] 06/13/2018   Lab Results  Component Value Date   TSH 2.924 09/04/2020       ASSESSMENT AND PLAN  Late onset Alzheimer's disease without behavioral disturbance (HCC)  Stenosis of intracranial vessel  Excessive sleepiness  1.   Dementia is most c/w late onset Alzheimer's disease.  Her daughter would prefer her to stay on supplements rather than try a different Alzheimer's medication..  She has been unable to tolerate donepezil. 2.    Continue melatonin 3 or 5 mg nightly to help with sleep.    3.    Try to stay active and exercise as tolerated. 4.   We discussed prognosis and treatments.  Advised to stay on aspirin and Plavix due to the intracranial stenosis. 5.   Return in 12 months or sooner for new or worsening neurologic symptoms.  Follow Up Instructions: I discussed the assessment and treatment plan with the patient. The patient was provided an opportunity to ask questions and all were answered. The patient agreed with the plan and demonstrated an understanding of the instructions.    The patient was advised to call back or seek an in-person evaluation if the symptoms worsen or if the condition fails to improve as anticipated.  I provided 33 minutes of non-face-to-face time during this encounter.     Knox Cervi A. Felecia Shelling, MD, [facility] [id number], [id number] AM Certified in Neurology, Clinical Neurophysiology, Sleep Medicine, Pain Medicine and Neuroimaging  [facility] [address], [place], [place] [address] [phone]

## 2021-02-27 ENCOUNTER — Telehealth: Payer: Self-pay | Admitting: *Deleted

## 2021-02-27 NOTE — Telephone Encounter (Signed)
  Patient Consent for Virtual Visit         Katelyn Powers has provided verbal consent on 02/27/2021 for a virtual visit (video or telephone).   CONSENT FOR VIRTUAL VISIT FOR:  Katelyn Powers  By participating in this virtual visit I agree to the following:  I hereby voluntarily request, consent and authorize [facility] and its employed or contracted physicians, physician assistants, nurse practitioners or other licensed health care professionals (the Practitioner), to provide me with telemedicine health care services (the "Services") as deemed necessary by the treating Practitioner. I acknowledge and consent to receive the Services by the Practitioner via telemedicine. I understand that the telemedicine visit will involve communicating with the Practitioner through live audiovisual communication technology and the disclosure of certain medical information by electronic transmission. I acknowledge that I have been given the opportunity to request an in-person assessment or other available alternative prior to the telemedicine visit and am voluntarily participating in the telemedicine visit.  I understand that I have the right to withhold or withdraw my consent to the use of telemedicine in the course of my care at any time, without affecting my right to future care or treatment, and that the Practitioner or I may terminate the telemedicine visit at any time. I understand that I have the right to inspect all information obtained and/or recorded in the course of the telemedicine visit and may receive copies of available information for a reasonable fee.  I understand that some of the potential risks of receiving the Services via telemedicine include:  Marland Kitchen Delay or interruption in medical evaluation due to technological equipment failure or disruption; . Information transmitted may not be sufficient (e.g. poor resolution of images) to allow for appropriate medical decision making by the Practitioner; and/or   . In rare instances, security protocols could fail, causing a breach of personal health information.  Furthermore, I acknowledge that it is my responsibility to provide information about my medical history, conditions and care that is complete and accurate to the best of my ability. I acknowledge that Practitioner's advice, recommendations, and/or decision may be based on factors not within their control, such as incomplete or inaccurate data provided by me or distortions of diagnostic images or specimens that may result from electronic transmissions. I understand that the practice of medicine is not an exact science and that Practitioner makes no warranties or guarantees regarding treatment outcomes. I acknowledge that a copy of this consent can be made available to me via my patient portal ([facility]), or I can request a printed copy by calling the office of [facility].    I understand that my insurance will be billed for this visit.   I have read or had this consent read to me. . I understand the contents of this consent, which adequately explains the benefits and risks of the Services being provided via telemedicine.  . I have been provided ample opportunity to ask questions regarding this consent and the Services and have had my questions answered to my satisfaction. . I give my informed consent for the services to be provided through the use of telemedicine in my medical care

## 2021-03-07 ENCOUNTER — Telehealth (INDEPENDENT_AMBULATORY_CARE_PROVIDER_SITE_OTHER): Payer: Medicare Other | Admitting: Physician Assistant

## 2021-03-07 ENCOUNTER — Other Ambulatory Visit: Payer: Self-pay

## 2021-03-07 ENCOUNTER — Encounter: Payer: Self-pay | Admitting: Physician Assistant

## 2021-03-07 VITALS — BP 120/70 | Ht 63.0 in | Wt 154.0 lb

## 2021-03-07 DIAGNOSIS — I10 Essential (primary) hypertension: Secondary | ICD-10-CM | POA: Diagnosis not present

## 2021-03-07 DIAGNOSIS — E782 Mixed hyperlipidemia: Secondary | ICD-10-CM

## 2021-03-07 DIAGNOSIS — I2510 Atherosclerotic heart disease of native coronary artery without angina pectoris: Secondary | ICD-10-CM

## 2021-03-07 DIAGNOSIS — I6521 Occlusion and stenosis of right carotid artery: Secondary | ICD-10-CM

## 2021-03-07 NOTE — Patient Instructions (Signed)
Medication Instructions:  Your physician recommends that you continue on your current medications as directed. Please refer to the Current Medication list given to you today.  *If you need a refill on your cardiac medications before your next appointment, please call your pharmacy*   Lab Work: NONE If you have labs (blood work) drawn today and your tests are completely normal, you will receive your results only by: Marland Kitchen MyChart Message (if you have MyChart) OR . A paper copy in the mail If you have any lab test that is abnormal or we need to change your treatment, we will call you to review the results.   Testing/Procedures: NONE   Follow-Up: At [facility], you and your health needs are our priority.  As part of our continuing mission to provide you with exceptional heart care, we have created designated Provider Care Teams.  These Care Teams include your primary Cardiologist (physician) and Advanced Practice Providers (APPs -  Physician Assistants and Nurse Practitioners) who all work together to provide you with the care you need, when you need it.  We recommend signing up for the patient portal called "MyChart".  Sign up information is provided on this After Visit Summary.  MyChart is used to connect with patients for Virtual Visits (Telemedicine).  Patients are able to view lab/test results, encounter notes, upcoming appointments, etc.  Non-urgent messages can be sent to your provider as well.   To learn more about what you can do with MyChart, go to [email or web address].    Your next appointment:   7 month(s) (December 2022) AFTER CAROTIDS ARE DONE  The format for your next appointment:   In Person   Provider:   You may see Sinclair Grooms, MD or one of the following Advanced Practice Providers on your designated Care Team:    Kathyrn Drown, NP

## 2021-03-07 NOTE — Progress Notes (Addendum)
Virtual Visit via Telephone Note   This visit type was conducted due to national recommendations for restrictions regarding the COVID-19 Pandemic (e.g. social distancing) in an effort to limit this patient's exposure and mitigate transmission in our community.  Due to her co-morbid illnesses, this patient is at least at moderate risk for complications without adequate follow up.  This format is felt to be most appropriate for this patient at this time.  The patient did not have access to video technology/had technical difficulties with video requiring transitioning to audio format only (telephone).  All issues noted in this document were discussed and addressed.  No physical exam could be performed with this format.  Please refer to the patient's chart for her  consent to telehealth for [facility].    Date:  03/07/2021   ID:  Katelyn Powers, DOB [birth year], MRN [id number] The patient was identified using 2 identifiers.  Patient Location: Home Provider Location: Office/Clinic   PCP:  Philmore Pali, NP   [facility] Providers Cardiologist:  Sinclair Grooms, MD     Evaluation Performed:  Follow-Up Visit  Chief Complaint:  Follow-up (CAD)    Patient Profile: Katelyn Powers is a 87 y.o. female with:  Coronary artery disease   NSTEMI in 08/2020 >> med Rx (due to dementia)  Pt/family declined beta-blocker Rx  Statin Intol   Carotid artery disease  [place] 12/21: R 40-59, L 1-39  Hypertension   Hyperlipidemia   Statin intol   Late onset Alzheimer's Dementia   Hx of CVA (noted on MRI)  Prior CV Studies: Carotid US 10/02/20 R 40-59, L 1-39  Limited Echocardiogram 09/06/20 EF 55-60, Gr 1 DD, mild MR, mild to mod AV sclerosis, no AS  Echocardiogram 09/05/20 EF 50-55, apical inf HK, Gr 1 DD, normal RVSF, mild LAE, trivial MR, mild AI, mod calcification of AV    History of Present Illness:   Ms. Depaul was last seen by Dr. Tamala Powers in 11/21.  She was admitted for possible TIA in  1/22.  MRI did not show anything acute, only an old CVA.  She was kept on DAPT with ASA and Plavix.  She is seen for f/u. Her daughter, Katelyn Powers provided the hx. DPR on file.  The pt is doing well without chest pain, shortness of breath, syncope, leg edema.  She stopped taking ezetimibe and is now on an Omega-3 supplement developed by a local physician.  Her PCP will check labs again in 6 mos.    Past Medical History:  Diagnosis Date  . Cataract   . Gout   . Hyperlipidemia   . Hypertension   . Memory loss   . NSTEMI (non-ST elevated myocardial infarction) ([place]) 08/2020  . Right-sided carotid artery disease ([place]) 09/17/2020   carotid doppler wth >60% occlusion   Past Surgical History:  Procedure Laterality Date  . MELANOMA EXCISION Right 1972   level 2  . 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 amLODipine (NORVASC) 5 MG tablet Take 1 tablet (5 mg total) by mouth daily.  Marland Kitchen aspirin 81 MG tablet Take 81 mg by mouth daily.  . Cholecalciferol (VITAMIN D3 PO) Take 1 Dose by mouth daily. Vit D3 (4,000U), Vit A, K2+ 4 immune boosters  . clopidogrel (PLAVIX) 75 MG tablet Take 1 tablet (75 mg total) by mouth daily.  . Ginkgo Biloba 120 MG CAPS Take  550 mg by mouth in the morning and at bedtime.  Marland Kitchen losartan (COZAAR) 50 MG tablet Take 1 tablet (50 mg total) by mouth daily.  . Melatonin 10 MG TABS Take 10 mg by mouth at bedtime.   . nitroGLYCERIN (NITROSTAT) 0.4 MG SL tablet Place 1 tablet (0.4 mg total) under the tongue every 5 (five) minutes as needed for chest pain.  . Omega-3-acid Eth &Multivit/Min (OMEGA-3 RX COMPLETE PO) Take by mouth.  Marland Kitchen UNABLE TO FIND Take 2 tablets by mouth daily. Med Name: Living-good daily: Healthy Cholesterol Support     Allergies:   Patient has no known allergies.   Social History   Tobacco Use  . Smoking status: Former Research scientist (life sciences)  . Smokeless tobacco: Never Used  Vaping Use  . Vaping  Use: Never used  Substance Use Topics  . Alcohol use: No    Alcohol/week: 0.0 standard drinks  . Drug use: No     Family Hx: The patient's family history includes Colon cancer (age of onset: 36) in her sister; Colon polyps in her brother.  ROS:   Please see the history of present illness.   Labs/Other Tests and Data Reviewed:    EKG:  No ECG reviewed.  Recent Labs: 09/04/2020: TSH 2.924 09/06/2020: Magnesium 1.9 11/07/2020: ALT 14; BUN 19; Creatinine, Ser 0.93; Hemoglobin 13.6; Platelets 207; Potassium 3.7; Sodium 136   Recent Lipid Panel Lab Results  Component Value Date/Time   CHOL 234 (H) 09/04/2020 09:18 AM   TRIG 220 (H) 09/04/2020 09:18 AM   HDL 52 09/04/2020 09:18 AM   CHOLHDL 4.5 09/04/2020 09:18 AM   LDLCALC 138 (H) 09/04/2020 09:18 AM    Wt Readings from Last 3 Encounters:  03/07/21 154 lb (69.9 kg)  11/08/20 149 lb 14.6 oz (68 kg)  09/26/20 150 lb (68 kg)     Risk Assessment/Calculations:      Objective:    Vital Signs:  BP 120/70   Ht 5\' 3"  (1.6 m)   Wt 154 lb (69.9 kg)   BMI 27.28 kg/m    VITAL SIGNS:  reviewed  ASSESSMENT & PLAN:    1. Coronary artery disease involving native coronary artery of native heart without angina pectoris S/p NSTEMI 11/21 managed medically.  The patient is doing well without chest pain, shortness of breath.  Continue dual antiplatelet Rx.  She has declined beta-blocker, statin Rx.  I suggested she f/u with Dr. Tamala Powers in person around Dec 2022 as this would be 1 year out from her MI.  Her daughter noted that she would not come in if she has to wear a mask.  As it stands now, our community is having another surge in cases of COVID-19 with a highly contagious variant and increasing hospitalizations.  Given data to support mask wearing that reduces the spread of the virus to others, especially our most vulnerable patients that are at risk for respiratory failure and death, I explained that we will likely be wearing masks in  clinic and hospital settings for a long time.  Therefore, it is highly likely her next visit will need to be virtual.     2. Essential hypertension The patient's blood pressure is controlled on her current regimen.  Continue current therapy.    3. Mixed hyperlipidemia Managed by primary care.  Goal LDL in patients with coronary artery disease and prior ACS is < 70.    4. Carotid stenosis, right Continue ASA, plavix.  Repeat carotid US is pending in 09/2021.  Time:   Today, I have spent 11 minutes with the patient with telehealth technology discussing the above problems.     Medication Adjustments/Labs and Tests Ordered: Current medicines are reviewed at length with the patient today.  Concerns regarding medicines are outlined above.   Tests Ordered: No orders of the defined types were placed in this encounter.   Medication Changes: No orders of the defined types were placed in this encounter.   Follow Up:  In Person in 6 month(s)  Signed, Richardson Dopp, PA-C  03/07/2021 4:35 PM    [place]

## 2021-04-09 ENCOUNTER — Emergency Department: Payer: Medicare Other

## 2021-04-09 ENCOUNTER — Other Ambulatory Visit: Payer: Self-pay

## 2021-04-09 ENCOUNTER — Emergency Department
Admission: EM | Admit: 2021-04-09 | Discharge: 2021-04-09 | Disposition: A | Discharge status: Home / Self Care | Payer: Medicare Other | Attending: Emergency Medicine | Admitting: Emergency Medicine

## 2021-04-09 DIAGNOSIS — Z87891 Personal history of nicotine dependence: Secondary | ICD-10-CM | POA: Diagnosis not present

## 2021-04-09 DIAGNOSIS — X32XXXA Exposure to sunlight, initial encounter: Secondary | ICD-10-CM | POA: Diagnosis not present

## 2021-04-09 DIAGNOSIS — I10 Essential (primary) hypertension: Secondary | ICD-10-CM | POA: Diagnosis not present

## 2021-04-09 DIAGNOSIS — R112 Nausea with vomiting, unspecified: Secondary | ICD-10-CM | POA: Diagnosis not present

## 2021-04-09 DIAGNOSIS — T675XXA Heat exhaustion, unspecified, initial encounter: Secondary | ICD-10-CM | POA: Insufficient documentation

## 2021-04-09 DIAGNOSIS — R42 Dizziness and giddiness: Secondary | ICD-10-CM | POA: Diagnosis not present

## 2021-04-09 DIAGNOSIS — Z7982 Long term (current) use of aspirin: Secondary | ICD-10-CM | POA: Insufficient documentation

## 2021-04-09 DIAGNOSIS — Z7902 Long term (current) use of antithrombotics/antiplatelets: Secondary | ICD-10-CM | POA: Insufficient documentation

## 2021-04-09 DIAGNOSIS — Z79899 Other long term (current) drug therapy: Secondary | ICD-10-CM | POA: Diagnosis not present

## 2021-04-09 DIAGNOSIS — Z743 Need for continuous supervision: Secondary | ICD-10-CM | POA: Diagnosis not present

## 2021-04-09 DIAGNOSIS — R1110 Vomiting, unspecified: Secondary | ICD-10-CM | POA: Diagnosis not present

## 2021-04-09 LAB — CBC WITH DIFFERENTIAL/PLATELET
Abs Immature Granulocytes: 0.12 10*3/uL — ABNORMAL HIGH (ref 0.00–0.07)
Basophils Absolute: 0.1 10*3/uL (ref 0.0–0.1)
Basophils Relative: 1 %
Eosinophils Absolute: 0.1 10*3/uL (ref 0.0–0.5)
Eosinophils Relative: 1 %
HCT: 38.2 % (ref 36.0–46.0)
Hemoglobin: 12.6 g/dL (ref 12.0–15.0)
Immature Granulocytes: 1 %
Lymphocytes Relative: 12 %
Lymphs Abs: 1.5 10*3/uL (ref 0.7–4.0)
MCH: 30 pg (ref 26.0–34.0)
MCHC: 33 g/dL (ref 30.0–36.0)
MCV: 91 fL (ref 80.0–100.0)
Monocytes Absolute: 0.8 10*3/uL (ref 0.1–1.0)
Monocytes Relative: 6 %
Neutro Abs: 10.4 10*3/uL — ABNORMAL HIGH (ref 1.7–7.7)
Neutrophils Relative %: 79 %
Platelets: 195 10*3/uL (ref 150–400)
RBC: 4.2 MIL/uL (ref 3.87–5.11)
RDW: 13.4 % (ref 11.5–15.5)
WBC: 12.9 10*3/uL — ABNORMAL HIGH (ref 4.0–10.5)
nRBC: 0 % (ref 0.0–0.2)

## 2021-04-09 LAB — URINALYSIS, COMPLETE (UACMP) WITH MICROSCOPIC
Bacteria, UA: NONE SEEN
Bilirubin Urine: NEGATIVE
Glucose, UA: 50 mg/dL — AB
Hgb urine dipstick: NEGATIVE
Ketones, ur: 5 mg/dL — AB
Leukocytes,Ua: NEGATIVE
Nitrite: NEGATIVE
Protein, ur: 100 mg/dL — AB
Specific Gravity, Urine: 1.014 (ref 1.005–1.030)
pH: 5 (ref 5.0–8.0)

## 2021-04-09 LAB — COMPREHENSIVE METABOLIC PANEL
ALT: 12 U/L (ref 0–44)
AST: 17 U/L (ref 15–41)
Albumin: 3.9 g/dL (ref 3.5–5.0)
Alkaline Phosphatase: 73 U/L (ref 38–126)
Anion gap: 9 (ref 5–15)
BUN: 25 mg/dL — ABNORMAL HIGH (ref 8–23)
CO2: 21 mmol/L — ABNORMAL LOW (ref 22–32)
Calcium: 8.8 mg/dL — ABNORMAL LOW (ref 8.9–10.3)
Chloride: 108 mmol/L (ref 98–111)
Creatinine, Ser: 0.96 mg/dL (ref 0.44–1.00)
GFR, Estimated: 58 mL/min — ABNORMAL LOW (ref >60)
Glucose, Bld: 159 mg/dL — ABNORMAL HIGH (ref 70–99)
Potassium: 3.8 mmol/L (ref 3.5–5.1)
Sodium: 138 mmol/L (ref 135–145)
Total Bilirubin: 1.1 mg/dL (ref 0.3–1.2)
Total Protein: 6.7 g/dL (ref 6.5–8.1)

## 2021-04-09 LAB — CK: Total CK: 32 U/L — ABNORMAL LOW (ref 38–234)

## 2021-04-09 LAB — TROPONIN I (HIGH SENSITIVITY)
Troponin I (High Sensitivity): 5 ng/L (ref <18)
Troponin I (High Sensitivity): 5 ng/L (ref <18)

## 2021-04-09 LAB — MAGNESIUM: Magnesium: 2 mg/dL (ref 1.7–2.4)

## 2021-04-09 LAB — CBG MONITORING, ED: Glucose-Capillary: 174 mg/dL — ABNORMAL HIGH (ref 70–99)

## 2021-04-09 MED ORDER — ONDANSETRON 4 MG PO TBDP: 4.0000 mg | ORAL_TABLET | Freq: Three times a day (TID) | ORAL | 0 refills | Status: DC | PRN

## 2021-04-09 MED ORDER — METOCLOPRAMIDE HCL 5 MG/ML IJ SOLN
10.0000 mg | Freq: Once | INTRAMUSCULAR | Status: AC
  Administered 2021-04-09: 15:00:00 10 mg via INTRAVENOUS
  Filled 2021-04-09: qty 2

## 2021-04-09 MED ORDER — SODIUM CHLORIDE 0.9 % IV SOLN
6.2500 mg | Freq: Four times a day (QID) | INTRAVENOUS | Status: DC | PRN
  Administered 2021-04-09: 18:00:00 6.25 mg via INTRAVENOUS
  Filled 2021-04-09: qty 0.25

## 2021-04-09 MED ORDER — SODIUM CHLORIDE 0.9 % IV BOLUS
500.0000 mL | Freq: Once | INTRAVENOUS | Status: AC
  Administered 2021-04-09: 18:00:00 500 mL via INTRAVENOUS

## 2021-04-09 MED ORDER — SODIUM CHLORIDE 0.9 % IV BOLUS
500.0000 mL | Freq: Once | INTRAVENOUS | Status: AC
  Administered 2021-04-09: 15:00:00 500 mL via INTRAVENOUS

## 2021-04-09 MED ORDER — ONDANSETRON HCL 4 MG/2ML IJ SOLN
4.0000 mg | Freq: Once | INTRAMUSCULAR | Status: AC
  Administered 2021-04-09: 15:00:00 4 mg via INTRAVENOUS

## 2021-04-09 MED ORDER — ONDANSETRON HCL 4 MG/2ML IJ SOLN
INTRAMUSCULAR | Status: AC
  Filled 2021-04-09: qty 2

## 2021-04-09 NOTE — ED Triage Notes (Signed)
Pt coming from home , Per daughter pt was sitting outside in the heat for aprox 1 hr. Pt started to have n/v , and general weakness   Hx, Dementia

## 2021-04-09 NOTE — ED Notes (Signed)
ED Provider at bedside. 

## 2021-04-09 NOTE — ED Provider Notes (Signed)
[facility] Emergency Department Provider Note  ____________________________________________   Event Date/Time   First MD Initiated Contact with Patient 04/09/21 1439     (approximate)  I have reviewed the triage vital signs and the nursing notes.   HISTORY  Chief Complaint Heat Exposure    HPI CHELLIE VANLUE is a 87 y.o. female with prior NSTEMI who comes in for concern for heat exhaustion.  Patient was outside for about an hour when the daughter came to her she was complaining of dizziness and weakness.  She noted to be very dry in nature and confused and they were concerned that she was having heat exhaustion.  It was 90 degrees outside.  They started to cool her down with ice around her feet and wet blankets.  However she had a lot of nausea and vomiting.  She is had multiple episodes of vomiting and got 4 mg of Zofran by EMS.  She continues to have vomiting here.  Denies any chest pain, shortness, abdominal pain.  She states that she felt her normal self prior to this happening.          Past Medical History:  Diagnosis Date   Cataract    Gout    Hyperlipidemia    Hypertension    Memory loss    NSTEMI (non-ST elevated myocardial infarction) ([place]) 08/2020   Right-sided carotid artery disease ([place]) 09/17/2020   carotid doppler wth >60% occlusion    Patient Active Problem List   Diagnosis Date Noted   Late onset Alzheimer's disease without behavioral disturbance ([place]) 02/22/2021   Stenosis of intracranial vessel 02/22/2021   CVA (cerebral vascular accident) ([place]) 11/07/2020   Hypotension 09/06/2020   NSTEMI (non-ST elevated myocardial infarction) ([place]) 09/04/2020   Vitamin D deficiency 10/27/2019   Excessive sleepiness 10/27/2019   Benign essential HTN 07/27/2018   Disorder of arteries and arterioles ([place]) 07/27/2018   Hyperlipidemia 07/27/2018   Memory loss 07/27/2018   Insomnia 07/27/2018   Abnormal brain MRI 07/27/2018   Carotid  stenosis, right 07/27/2018    Past Surgical History:  Procedure Laterality Date   MELANOMA EXCISION Right 1972   level 2   SUPERFICIAL LYMPH 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 Authorizing Provider  amLODipine (NORVASC) 5 MG tablet Take 1 tablet (5 mg total) by mouth daily. 09/07/20   Darreld Mclean, PA-C  aspirin 81 MG tablet Take 81 mg by mouth daily.    [provider]  Cholecalciferol (VITAMIN D3 PO) Take 1 Dose by mouth daily. Vit D3 (4,000U), Vit A, K2+ 4 immune boosters    [provider]  clopidogrel (PLAVIX) 75 MG tablet Take 1 tablet (75 mg total) by mouth daily. 09/07/20   Sande Rives E, PA-C  Ginkgo Biloba 120 MG CAPS Take 550 mg by mouth in the morning and at bedtime.    [provider]  losartan (COZAAR) 50 MG tablet Take 1 tablet (50 mg total) by mouth daily. 09/07/20   Darreld Mclean, PA-C  Melatonin 10 MG TABS Take 10 mg by mouth at bedtime.     [provider]  nitroGLYCERIN (NITROSTAT) 0.4 MG SL tablet Place 1 tablet (0.4 mg total) under the tongue every 5 (five) minutes as needed for chest pain. 09/07/20   Sande Rives E, PA-C  Omega-3-acid Eth &Multivit/Min (OMEGA-3 RX COMPLETE PO) Take by mouth.  [provider]  UNABLE TO FIND Take 2 tablets by mouth daily. Med Name: Living-good daily: Healthy Cholesterol Support    [provider]    Allergies Patient has no known allergies.  Family History  Problem Relation Age of Onset   Colon cancer Sister 5   Colon polyps Brother     Social History Social History   Tobacco Use   Smoking status: Former    Pack years: 0.00   Smokeless tobacco: Never  Vaping Use   Vaping Use: Never used  Substance Use Topics   Alcohol use: No    Alcohol/week: 0.0 standard drinks   Drug use: No      Review of Systems Constitutional: No fever/chills,  confusion Eyes: No visual changes. ENT: No sore throat. Cardiovascular: Denies chest pain. Respiratory: Denies shortness of breath. Gastrointestinal: Positive nausea, vomiting Genitourinary: Negative for dysuria. Musculoskeletal: Negative for back pain. Skin: Negative for rash. Neurological: Negative for headaches, focal weakness or numbness. All other ROS negative ____________________________________________   PHYSICAL EXAM:  VITAL SIGNS: Blood pressure (!) 144/72, pulse 85, temperature 97.8 F (36.6 C), temperature source Oral, resp. rate 14, height 5\' 3"  (1.6 m), weight 69.9 kg, SpO2 96 %.  Constitutional: Alert and oriented.  Actively vomiting Eyes: Conjunctivae are normal. EOMI. Head: Atraumatic. Nose: No congestion/rhinnorhea. Mouth/Throat: Mucous membranes are moist.   Neck: No stridor. Trachea Midline. FROM Cardiovascular: Normal rate, regular rhythm. Grossly normal heart sounds.  Good peripheral circulation. Respiratory: Normal respiratory effort.  No retractions. Lungs CTAB. Gastrointestinal: Soft and nontender. No distention. No abdominal bruits.  Patient is actively vomiting Musculoskeletal: No lower extremity tenderness nor edema.  No joint effusions. Neurologic:  Normal speech and language. No gross focal neurologic deficits are appreciated.  Skin:  Skin is warm, dry and intact. No rash noted. Psychiatric: Mood and affect are normal. Speech and behavior are normal. GU: Deferred   ____________________________________________   LABS (all labs ordered are listed, but only abnormal results are displayed)  Labs Reviewed  CBC WITH DIFFERENTIAL/PLATELET - Abnormal; Notable for the following components:      Result Value   WBC 12.9 (*)    Neutro Abs 10.4 (*)    Abs Immature Granulocytes 0.12 (*)    All other components within normal limits  COMPREHENSIVE METABOLIC PANEL - Abnormal; Notable for the following components:   CO2 21 (*)    Glucose, Bld 159 (*)     BUN 25 (*)    Calcium 8.8 (*)    GFR, Estimated 58 (*)    All other components within normal limits  CK - Abnormal; Notable for the following components:   Total CK 32 (*)    All other components within normal limits  CBG MONITORING, ED - Abnormal; Notable for the following components:   Glucose-Capillary 174 (*)    All other components within normal limits  MAGNESIUM  URINALYSIS, COMPLETE (UACMP) WITH MICROSCOPIC  TROPONIN I (HIGH SENSITIVITY)  TROPONIN I (HIGH SENSITIVITY)   ____________________________________________   ED ECG REPORT I, Vanessa McEwen, the attending physician, personally viewed and interpreted this ECG.  Normal sinus rate of 79, no ST elevation, no T wave inversions, type I AV block  ___________________  RADIOLOGY Robert Bellow, personally viewed and evaluated these images (plain radiographs) as part of my medical decision making, as well as reviewing the written report by the radiologist.  ED MD interpretation no PNA  Official radiology report(s): CT Head Wo Contrast  Result Date: 04/09/2021 CLINICAL  DATA:  Dizziness.  Nausea, vomiting, and weakness. EXAM: CT HEAD WITHOUT CONTRAST TECHNIQUE: Contiguous axial images were obtained from the base of the skull through the vertex without intravenous contrast. COMPARISON:  Head CT and MRI 11/07/2020 FINDINGS: Brain: There is no evidence of an acute infarct, intracranial hemorrhage, mass, midline shift, or extra-axial fluid collection. Small chronic infarcts are again noted involving left parietal cortex and right frontal subcortical white matter. Patchy hypodensities in the cerebral white matter bilaterally are nonspecific but compatible with moderate chronic small vessel ischemic disease. Cerebral atrophy is unchanged with note made of asymmetrically advanced mesial left temporal lobe volume loss. Vascular: Calcified atherosclerosis at the skull base. No hyperdense vessel. Skull: No fracture or suspicious osseous lesion.  Sinuses/Orbits: Visualized paranasal sinuses and mastoid air cells are clear. Unremarkable orbits. Other: None. IMPRESSION: 1. No evidence of acute intracranial abnormality. 2. Moderate chronic small vessel ischemic disease. Electronically Signed   By: Logan Bores M.D.   On: 04/09/2021 18:47   DG Abdomen Acute W/Chest  Result Date: 04/09/2021 CLINICAL DATA:  Vomiting EXAM: DG ABDOMEN ACUTE WITH 1 VIEW CHEST COMPARISON:  None. FINDINGS: There is no evidence of dilated bowel loops or free intraperitoneal air. No radiopaque calculi or other significant radiographic abnormality is seen. Heart size and mediastinal contours are within normal limits. No focal airspace consolidation. IMPRESSION: Negative abdominal radiographs.  No acute cardiopulmonary disease. Electronically Signed   By: Ulyses Jarred M.D.   On: 04/09/2021 19:20    ____________________________________________   PROCEDURES  Procedure(s) performed (including Critical Care):  .1-3 Lead EKG Interpretation  Date/Time: 04/09/2021 5:48 PM Performed by: Vanessa Bethel, MD Authorized by: Vanessa Cornelius, MD     Interpretation: normal     ECG rate:  80s   ECG rate assessment: normal     Rhythm: sinus rhythm     Ectopy: none     Conduction: normal     ____________________________________________   INITIAL IMPRESSION / ASSESSMENT AND PLAN / ED COURSE  NOAMI BOVE was evaluated in Emergency Department on 04/09/2021 for the symptoms described in the history of present illness. She was evaluated in the context of the global COVID-19 pandemic, which necessitated consideration that the patient might be at risk for infection with the SARS-CoV-2 virus that causes COVID-19. Institutional protocols and algorithms that pertain to the evaluation of patients at risk for COVID-19 are in a state of rapid change based on information released by regulatory bodies including the CDC and federal and state organizations. These policies and algorithms were  followed during the patient's care in the ED.    Patient is a 87 year old who comes in with sudden onset of nausea, vomiting, and confusion after being out in the heat for over 1 hour.  Labs to be ordered to evaluate for Electra MIs, AKI, rhabdo cardiac marker to evaluate for ACS.  Patient be given 540mL of fluids to help with heat exhaustion.  Patient is already received 700 mL with EMS patient will be given another 4 mg of IV Zofran.  At this time abdomen is soft and nontender and low suspicion for acute abdominal pathology  Patient remains very nauseous and required additional IV Reglan and then additional IV Phenergan.  She was having a lot of weakness with standing up so we will give another 500 mL of fluid given no signs of any respiratory issues.  Given the continued vomiting we will get a x-ray of the abdomen just to ensure no obstruction also CT  head to make sure no evidence of mass, bleed that would cause continued vomiting.  CT imaging and x-ray negative.  Rediscussed with daughter about getting her up and walking around but she is very sleepy from the medications.  Patient be handed off to oncoming team pending reassessment and most likely discharge home if symptoms are better         ____________________________________________   FINAL CLINICAL IMPRESSION(S) / ED DIAGNOSES   Final diagnoses:  Nausea & vomiting  Heat exhaustion, initial encounter      MEDICATIONS GIVEN DURING THIS VISIT:  Medications  ondansetron (ZOFRAN) 4 MG/2ML injection (  Not Given 04/09/21 1520)  promethazine (PHENERGAN) 6.25 mg in sodium chloride 0.9 % 50 mL IVPB (0 mg Intravenous Stopped 04/09/21 1824)  ondansetron (ZOFRAN) injection 4 mg (4 mg Intravenous Given 04/09/21 1445)  sodium chloride 0.9 % bolus 500 mL (0 mLs Intravenous Stopped 04/09/21 1614)  metoCLOPramide (REGLAN) injection 10 mg (10 mg Intravenous Given 04/09/21 1522)  sodium chloride 0.9 % bolus 500 mL (0 mLs Intravenous Stopped  04/09/21 1824)     ED Discharge Orders          Ordered    ondansetron (ZOFRAN ODT) 4 MG disintegrating tablet  Every 8 hours PRN,   Status:  Discontinued        04/09/21 1935    ondansetron (ZOFRAN ODT) 4 MG disintegrating tablet  Every 8 hours PRN        04/09/21 1935             Note:  This document was prepared using Dragon voice recognition software and may include unintentional dictation errors.    Vanessa [place], MD 04/09/21 [phone]

## 2021-04-09 NOTE — ED Notes (Signed)
Patient sitting on side of bed with daughter assistance. Fall socks placed on patient. Patient ambulatory to the restroom with x2 standby assistance. Generalized weakness noted. Urine specimen obtained. Peri care provided and brief placed on patient. Patient back on monitor with daughter at bedside. Plan of care discussed with no further questions at this time.

## 2021-04-09 NOTE — ED Notes (Signed)
Patient transported to CT 

## 2021-04-09 NOTE — ED Provider Notes (Signed)
-----------------------------------------   10:28 PM on 04/09/2021 ----------------------------------------- Patient was able to sleep in the emergency department.  No longer feels nauseous.  Patient and daughter both asking be discharged home.  They were able to ambulate to the restroom with some help.  Daughter states they will be able to watch her at home.  Discussed supportive care at home including plenty of fluids and rest.  Lab work was nonrevealing including negative troponin x2.   Harvest Dark, MD 04/09/21 2229

## 2021-04-09 NOTE — ED Notes (Signed)
Pt daughter refused covid swab , MD. Jari Pigg advised

## 2021-05-16 IMAGING — MR MR MRA HEAD W/O CM
3 series · 48 of 48 positions shown · non-contrast
Comparison: 11/07/2020 and prior.

CLINICAL DATA: Neuro deficit, acute, stroke suspected

EXAM:
MRI HEAD WITHOUT CONTRAST
MRA HEAD WITHOUT CONTRAST
TECHNIQUE: Multiplanar, multiecho pulse sequences of the brain and surrounding
structures were obtained without intravenous contrast. Angiographic
images of the head were obtained using MRA technique without
contrast.

[Series 20: FLAIR · axial · 3.0mm · 0.53mm/px · z∈[-105,+55]mm · 11 of 55 slices shown]
[im 1/55]
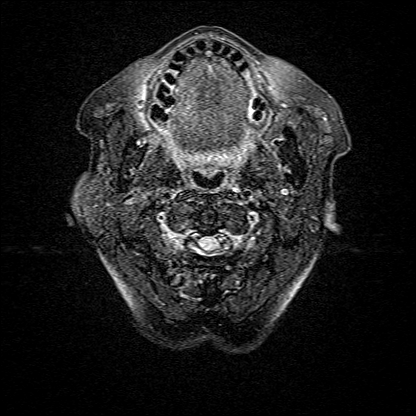
[im 6/55]
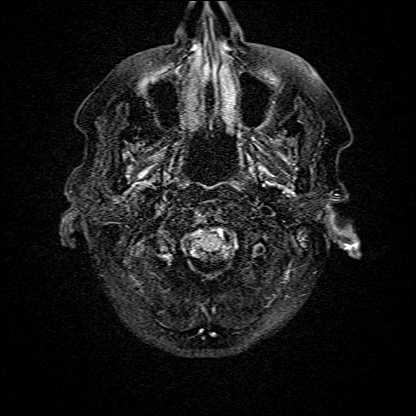
[im 11/55]
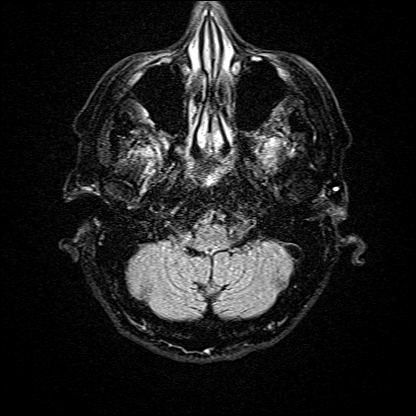
[im 17/55]
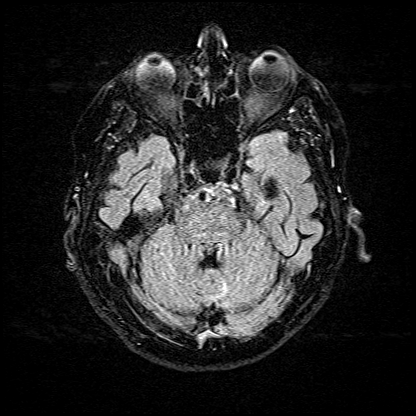
[im 22/55]
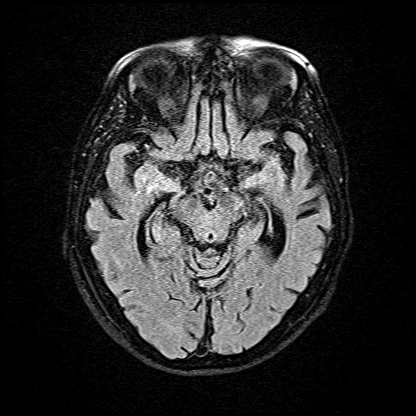
[im 28/55]
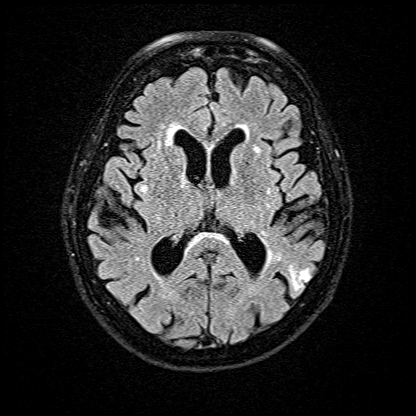
[im 33/55]
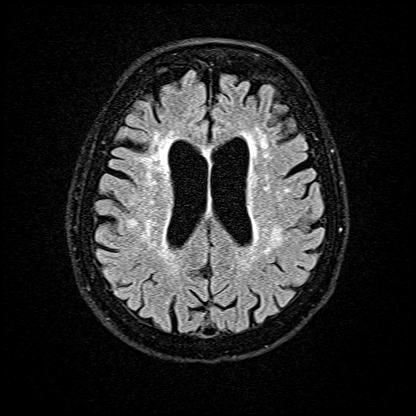
[im 38/55]
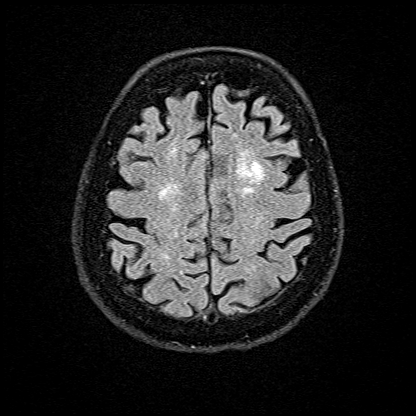
[im 44/55]
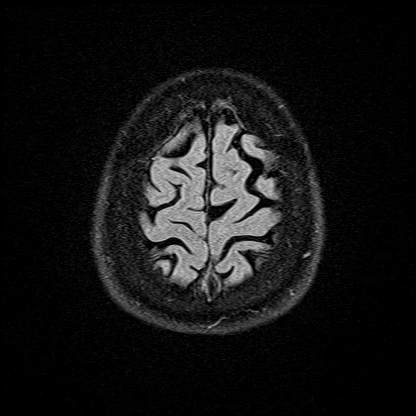
[im 49/55]
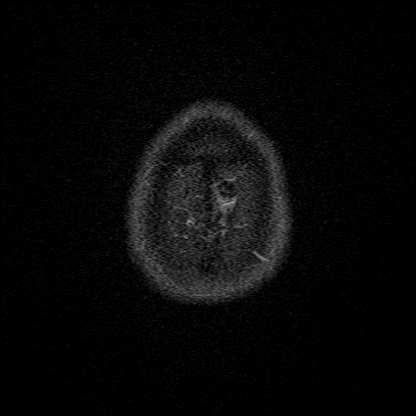
[im 55/55]
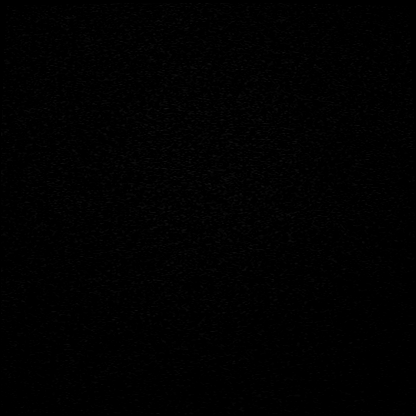

[Series 21: T1 · axial · 1.0mm · 0.98mm/px · z∈[-114,+59]mm · 32 of 174 slices shown]
[im 1/174]
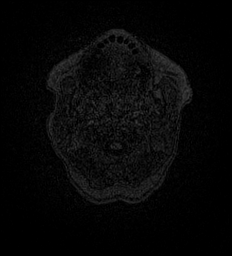
[im 6/174]
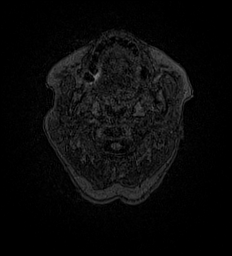
[im 12/174]
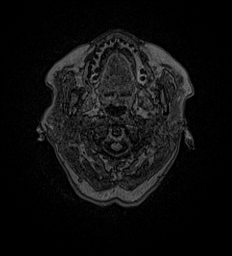
[im 17/174]
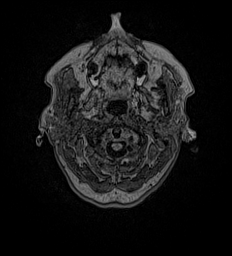
[im 23/174]
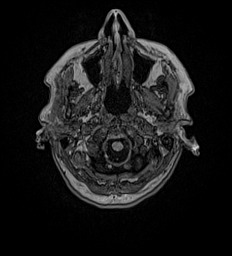
[im 28/174]
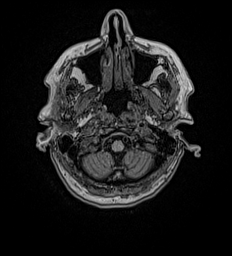
[im 34/174]
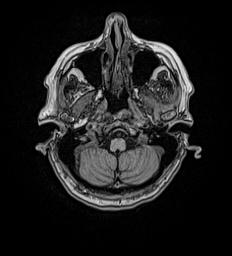
[im 40/174]
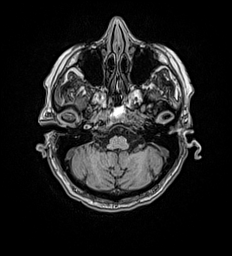
[im 45/174]
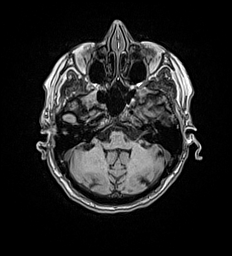
[im 51/174]
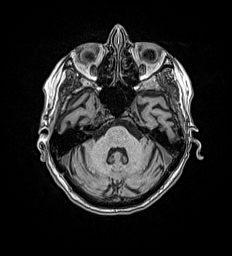
[im 56/174]
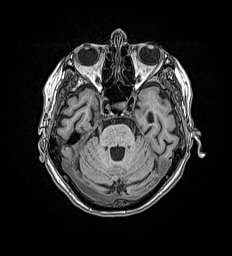
[im 62/174]
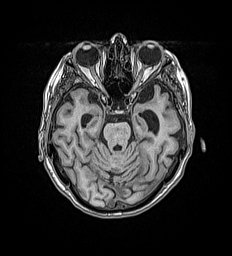
[im 67/174]
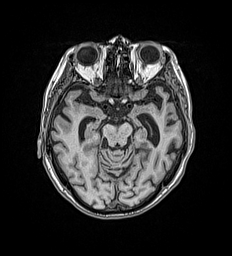
[im 73/174]
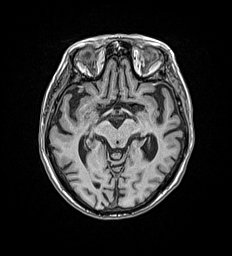
[im 79/174]
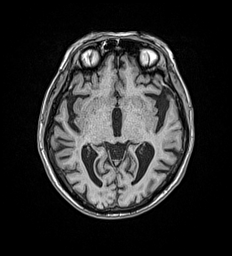
[im 84/174]
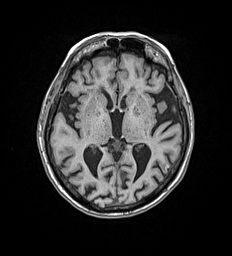
[im 90/174]
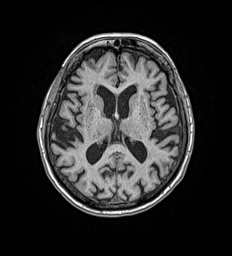
[im 95/174]
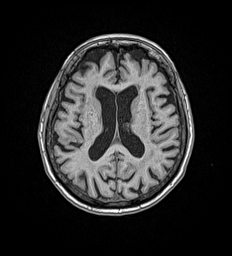
[im 101/174]
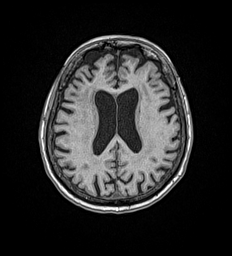
[im 107/174]
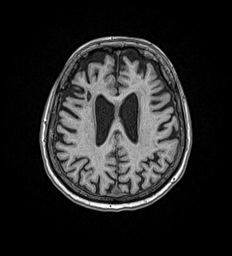
[im 112/174]
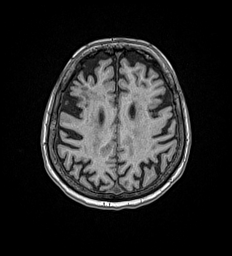
[im 118/174]
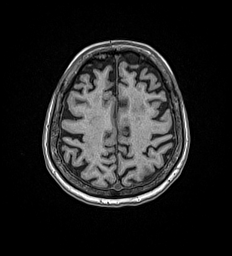
[im 123/174]
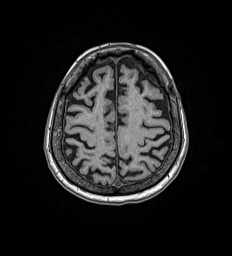
[im 129/174]
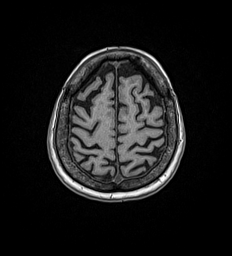
[im 134/174]
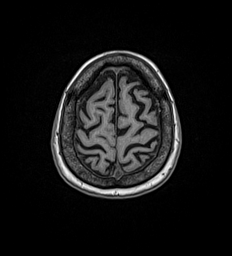
[im 140/174]
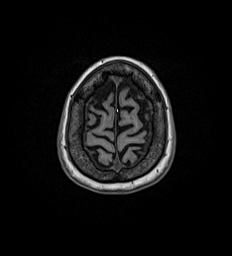
[im 146/174]
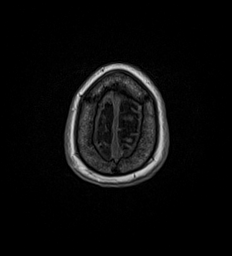
[im 151/174]
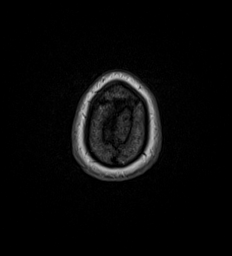
[im 157/174]
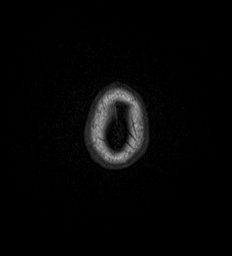
[im 162/174]
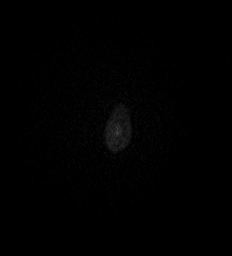
[im 168/174]
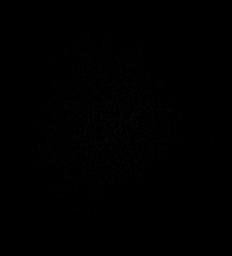
[im 174/174]
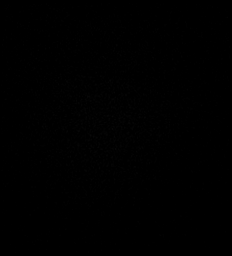

[Series 22: T2 · coronal · 5.0mm · 0.57mm/px · 5 of 29 slices shown]
[im 1/29]
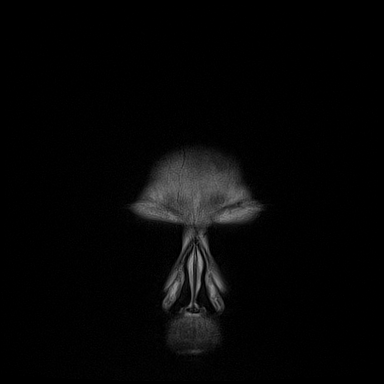
[im 8/29]
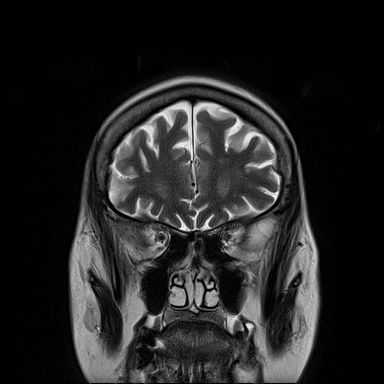
[im 15/29]
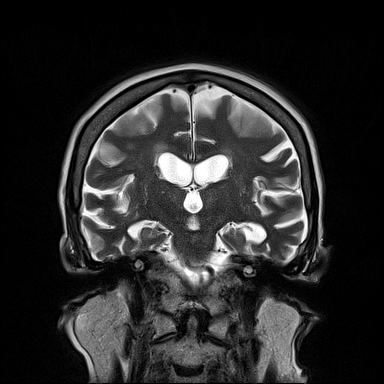
[im 22/29]
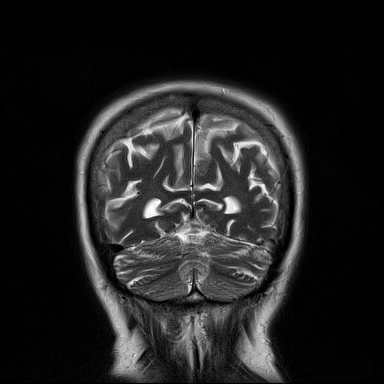
[im 29/29]
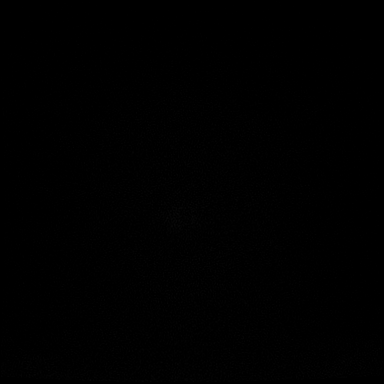

[48 of 48 positions shown; findings below may reference images not displayed]

FINDINGS: MRI HEAD FINDINGS

Brain: No diffusion-weighted signal abnormality. No midline shift,
ventriculomegaly or extra-axial fluid collection. No mass lesion.
Mild cerebral atrophy with ex vacuo dilatation. Moderate chronic
microvascular ischemic changes. Remote right corona radiata insult.
Sequela of prior cortically based left parietal insult ([DATE]).
Associated intrinsically T1 hyperintense signal/SWI signal dropout
likely reflects cortical laminar necrosis.

Vascular: Please see MRA.

Skull and upper cervical spine: Normal marrow signal.

Sinuses/Orbits: Normal orbits. Clear paranasal sinuses. Trace left
mastoid effusion.

Other: None.

MRA HEAD FINDINGS

Anterior circulation: Patent ICAs. Patent ACAs. Patent MCAs. No
significant stenosis, proximal occlusion, aneurysm, or vascular
malformation.

Posterior circulation: Dominant and patent left V4 segment. Moderate
to severe multifocal right V4 segment narrowing and irregularity
likely secondary to atheromatous disease. Proximally patent PICA.
Patent basilar and proximal superior cerebellar arteries. Patent
right PCA. High-grade narrowing of the left P1 segment with non
opacification distally.

Venous sinuses: No evidence of thrombosis.

Anatomic variants: None of significance.
IMPRESSION: MRI head:

No acute intracranial process. Remote left parietal insult with
sequela of cortical laminar necrosis.

Remote right corona radiata insult. Moderate chronic microvascular
ischemic changes.

MRA head:

High-grade narrowing of the left P1 segment with non opacification
distally, likely chronic.

Moderate to severe right V4 segment narrowing and irregularity
likely secondary to atheromatous disease.

## 2021-05-16 IMAGING — CT CT HEAD W/O CM
3 series · 16 of 46 positions shown, 19 images · non-contrast
Comparison: 06/13/18

CLINICAL DATA: Neurologic deficit.  Stroke suspected.

EXAM:
CT HEAD WITHOUT CONTRAST
TECHNIQUE: Contiguous axial images were obtained from the base of the skull
through the vertex without intravenous contrast.

[Series 2: head wo · axial · 0.42mm/px · z∈[-165,-45]mm · 10 of 29 slices shown, 13 images]
[im 3/29  brain]
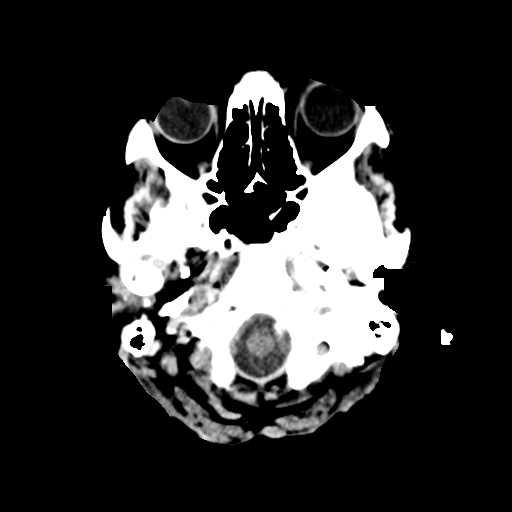
[im 3/29  bone]
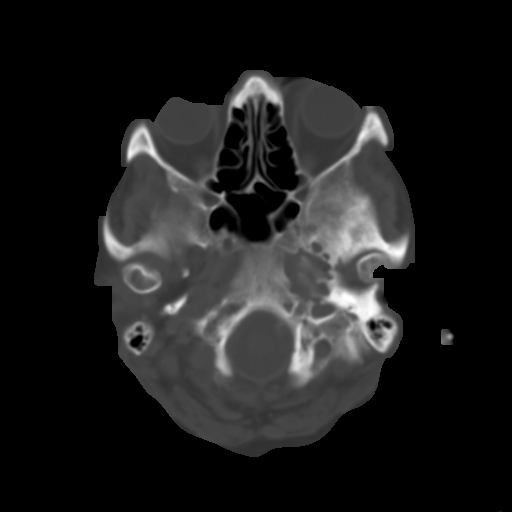
[im 6/29  brain]
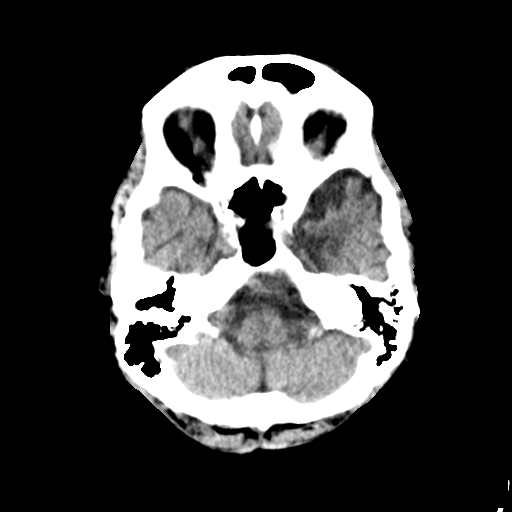
[im 8/29  brain]
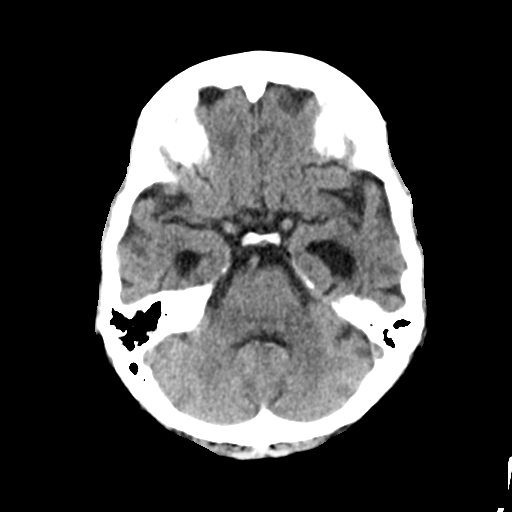
[im 11/29  brain]
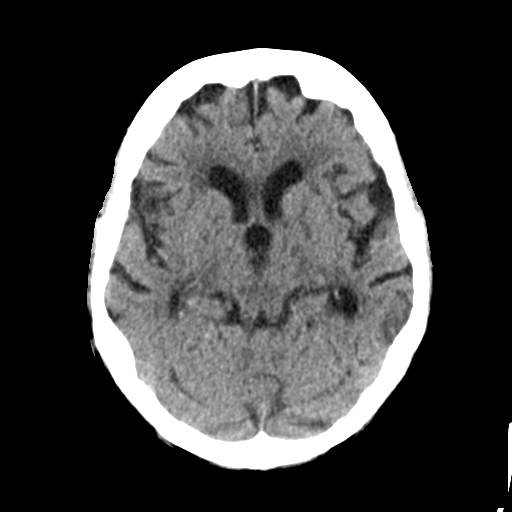
[im 14/29  brain]
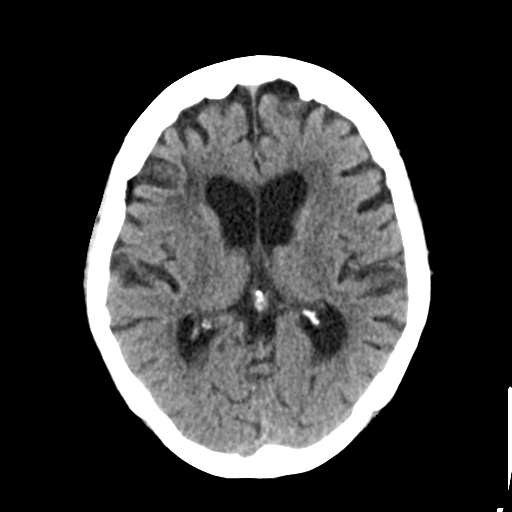
[im 14/29  bone]
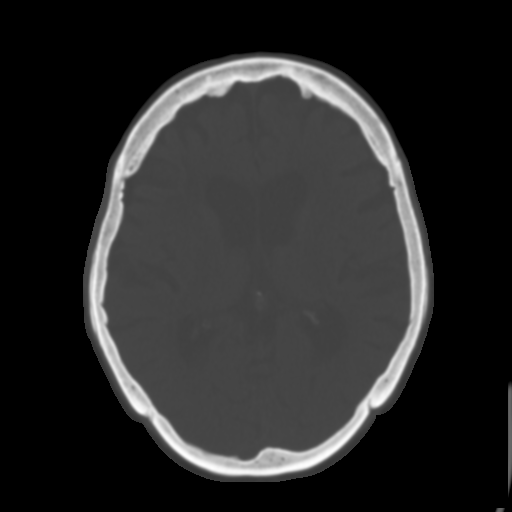
[im 16/29  brain]
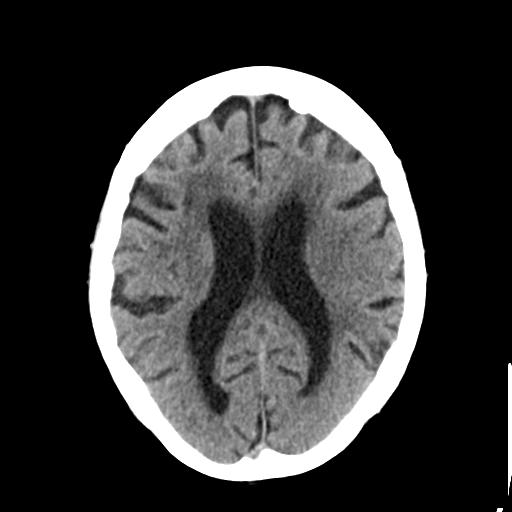
[im 19/29  brain]
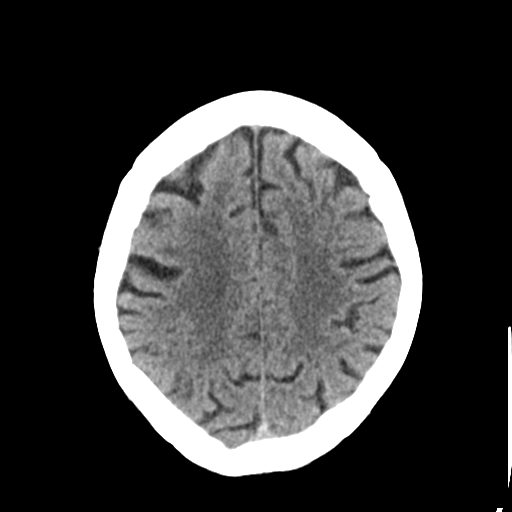
[im 22/29  brain]
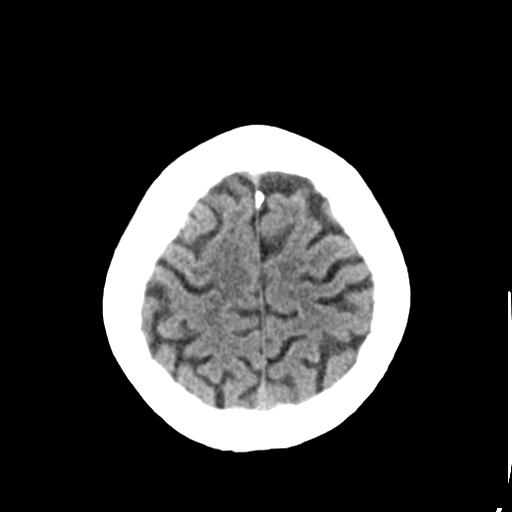
[im 24/29  brain]
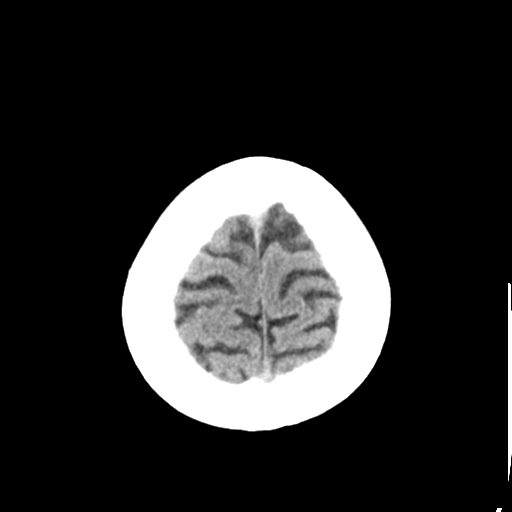
[im 24/29  bone]
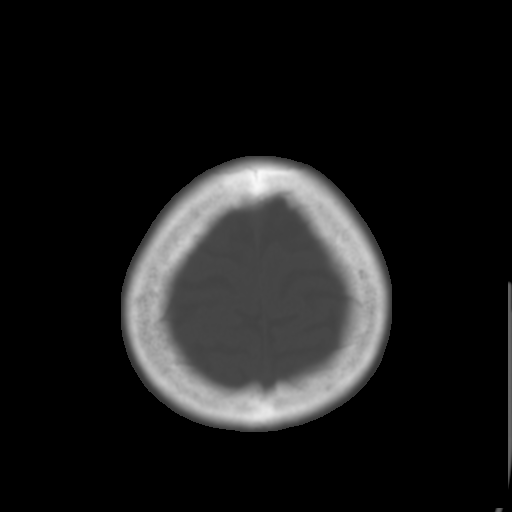
[im 27/29  brain]
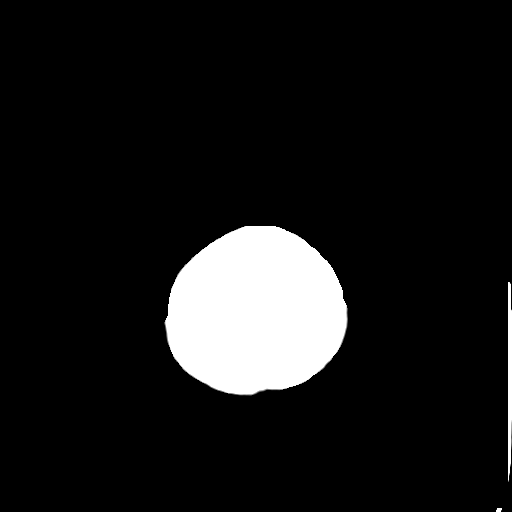

[Series 4: coronal soft tissue · coronal · 0.29mm/px · 3 of 59 slices shown]
[im 20/59  brain]
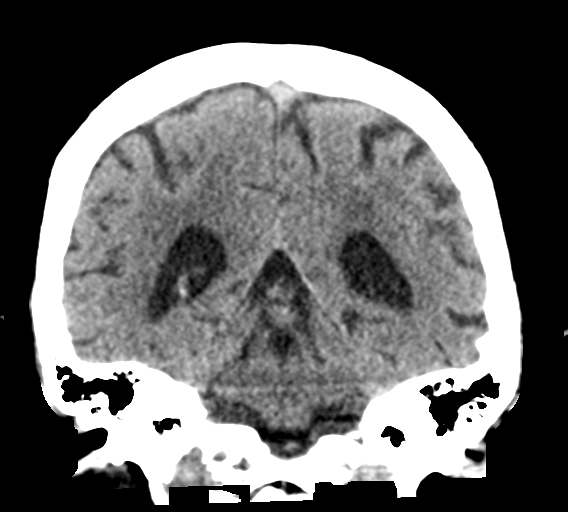
[im 26/59  brain]
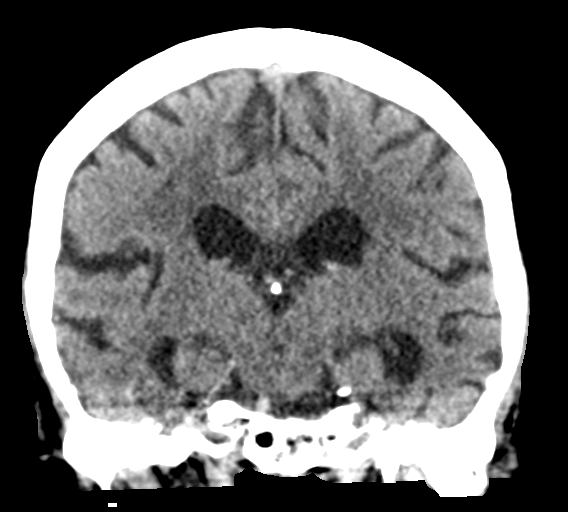
[im 33/59  brain]
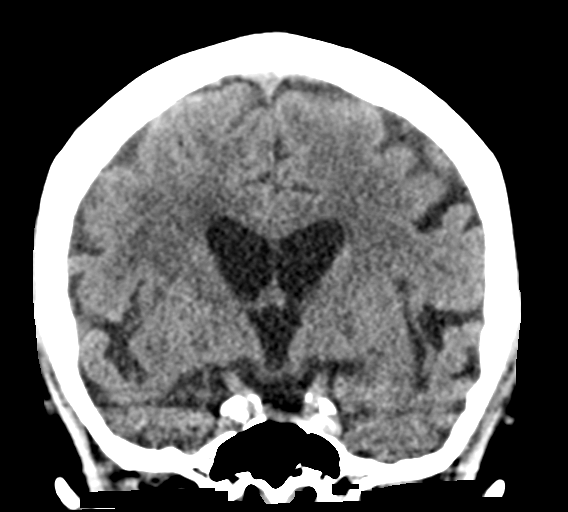

[Series 5: sagittal soft tissue · sagittal · 0.31mm/px · 3 of 51 slices shown]
[im 17/51  brain]
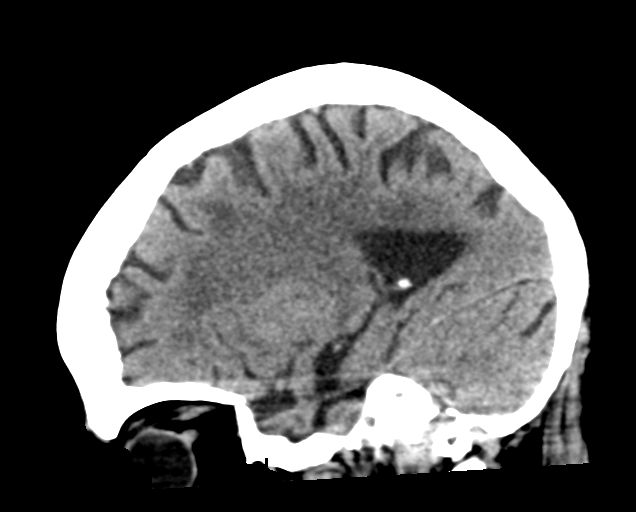
[im 26/51  brain]
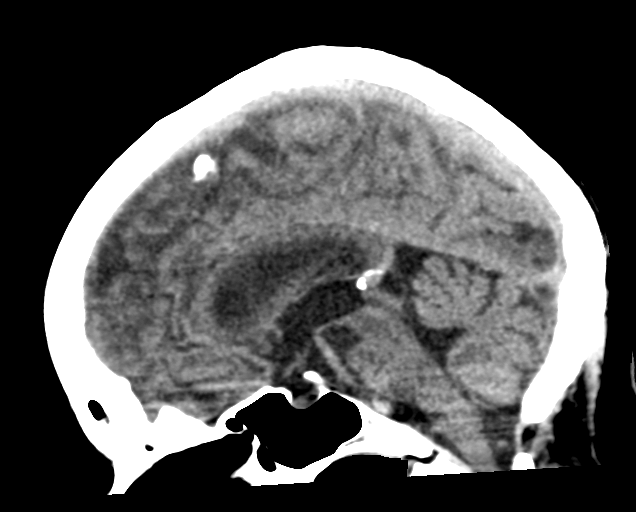
[im 34/51  brain]
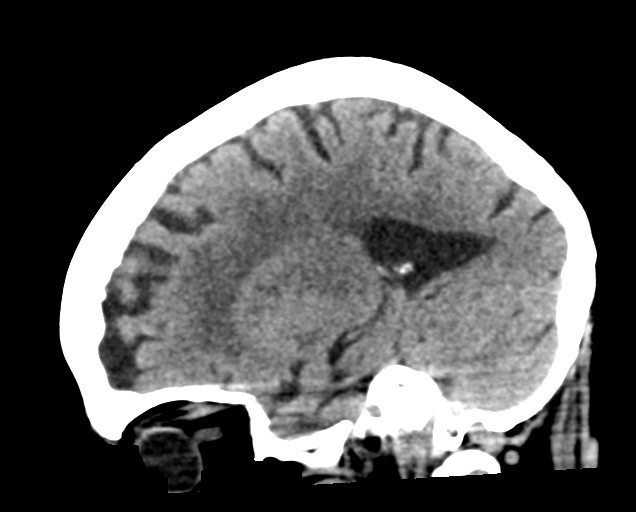

[16 of 46 positions shown; findings below may reference images not displayed]

FINDINGS: Brain: No evidence of acute infarction, hemorrhage, hydrocephalus,
extra-axial collection or mass lesion/mass effect. There is
progressive diffuse low-attenuation within the subcortical and
periventricular white matter compatible with chronic microvascular
disease. Prominence of the sulci and ventricles compatible with
brain atrophy.

Vascular: No hyperdense vessel or unexpected calcification.

Skull: Normal. Negative for fracture or focal lesion.

Sinuses/Orbits: No acute finding.

Other: None
IMPRESSION: 1. No acute intracranial abnormalities.
2. Chronic small vessel ischemic disease and brain atrophy.

## 2021-07-13 ENCOUNTER — Other Ambulatory Visit: Payer: Self-pay | Admitting: Student

## 2021-07-14 ENCOUNTER — Other Ambulatory Visit: Payer: Self-pay | Admitting: Student

## 2021-08-22 DIAGNOSIS — I2510 Atherosclerotic heart disease of native coronary artery without angina pectoris: Secondary | ICD-10-CM | POA: Diagnosis not present

## 2021-08-22 DIAGNOSIS — I6521 Occlusion and stenosis of right carotid artery: Secondary | ICD-10-CM | POA: Diagnosis not present

## 2021-08-22 DIAGNOSIS — I10 Essential (primary) hypertension: Secondary | ICD-10-CM | POA: Diagnosis not present

## 2021-08-22 DIAGNOSIS — E785 Hyperlipidemia, unspecified: Secondary | ICD-10-CM | POA: Diagnosis not present

## 2021-09-18 DIAGNOSIS — E119 Type 2 diabetes mellitus without complications: Secondary | ICD-10-CM | POA: Diagnosis not present

## 2021-09-25 ENCOUNTER — Inpatient Hospital Stay (HOSPITAL_COMMUNITY)
Admission: EM | Admit: 2021-09-25 | Discharge: 2021-09-27 | DRG: 388 | Disposition: A | Discharge status: Home Health | Payer: Medicare Other | Attending: Internal Medicine | Admitting: Internal Medicine

## 2021-09-25 ENCOUNTER — Emergency Department (HOSPITAL_COMMUNITY): Payer: Medicare Other

## 2021-09-25 ENCOUNTER — Other Ambulatory Visit: Payer: Self-pay

## 2021-09-25 ENCOUNTER — Ambulatory Visit: Payer: Self-pay

## 2021-09-25 ENCOUNTER — Encounter (HOSPITAL_COMMUNITY): Payer: Self-pay

## 2021-09-25 ENCOUNTER — Inpatient Hospital Stay (HOSPITAL_COMMUNITY): Payer: Medicare Other

## 2021-09-25 DIAGNOSIS — Z85820 Personal history of malignant melanoma of skin: Secondary | ICD-10-CM | POA: Diagnosis not present

## 2021-09-25 DIAGNOSIS — E1122 Type 2 diabetes mellitus with diabetic chronic kidney disease: Secondary | ICD-10-CM | POA: Diagnosis not present

## 2021-09-25 DIAGNOSIS — E782 Mixed hyperlipidemia: Secondary | ICD-10-CM | POA: Diagnosis present

## 2021-09-25 DIAGNOSIS — Z7902 Long term (current) use of antithrombotics/antiplatelets: Secondary | ICD-10-CM

## 2021-09-25 DIAGNOSIS — R9389 Abnormal findings on diagnostic imaging of other specified body structures: Secondary | ICD-10-CM | POA: Diagnosis not present

## 2021-09-25 DIAGNOSIS — I6529 Occlusion and stenosis of unspecified carotid artery: Secondary | ICD-10-CM | POA: Diagnosis present

## 2021-09-25 DIAGNOSIS — Z8673 Personal history of transient ischemic attack (TIA), and cerebral infarction without residual deficits: Secondary | ICD-10-CM

## 2021-09-25 DIAGNOSIS — E119 Type 2 diabetes mellitus without complications: Secondary | ICD-10-CM

## 2021-09-25 DIAGNOSIS — R011 Cardiac murmur, unspecified: Secondary | ICD-10-CM | POA: Diagnosis not present

## 2021-09-25 DIAGNOSIS — Z79899 Other long term (current) drug therapy: Secondary | ICD-10-CM | POA: Diagnosis not present

## 2021-09-25 DIAGNOSIS — Z79890 Hormone replacement therapy (postmenopausal): Secondary | ICD-10-CM | POA: Diagnosis not present

## 2021-09-25 DIAGNOSIS — K56609 Unspecified intestinal obstruction, unspecified as to partial versus complete obstruction: Secondary | ICD-10-CM | POA: Diagnosis not present

## 2021-09-25 DIAGNOSIS — Z20822 Contact with and (suspected) exposure to covid-19: Secondary | ICD-10-CM | POA: Diagnosis present

## 2021-09-25 DIAGNOSIS — D72829 Elevated white blood cell count, unspecified: Secondary | ICD-10-CM

## 2021-09-25 DIAGNOSIS — J690 Pneumonitis due to inhalation of food and vomit: Secondary | ICD-10-CM | POA: Diagnosis present

## 2021-09-25 DIAGNOSIS — K56600 Partial intestinal obstruction, unspecified as to cause: Principal | ICD-10-CM | POA: Diagnosis present

## 2021-09-25 DIAGNOSIS — G301 Alzheimer's disease with late onset: Secondary | ICD-10-CM | POA: Diagnosis present

## 2021-09-25 DIAGNOSIS — M109 Gout, unspecified: Secondary | ICD-10-CM | POA: Diagnosis not present

## 2021-09-25 DIAGNOSIS — Z90710 Acquired absence of both cervix and uterus: Secondary | ICD-10-CM

## 2021-09-25 DIAGNOSIS — I5032 Chronic diastolic (congestive) heart failure: Secondary | ICD-10-CM | POA: Diagnosis not present

## 2021-09-25 DIAGNOSIS — Z87891 Personal history of nicotine dependence: Secondary | ICD-10-CM | POA: Diagnosis not present

## 2021-09-25 DIAGNOSIS — R1111 Vomiting without nausea: Secondary | ICD-10-CM | POA: Diagnosis not present

## 2021-09-25 DIAGNOSIS — Z7982 Long term (current) use of aspirin: Secondary | ICD-10-CM | POA: Diagnosis not present

## 2021-09-25 DIAGNOSIS — R109 Unspecified abdominal pain: Secondary | ICD-10-CM

## 2021-09-25 DIAGNOSIS — F0280 Dementia in other diseases classified elsewhere without behavioral disturbance: Secondary | ICD-10-CM | POA: Diagnosis not present

## 2021-09-25 DIAGNOSIS — R1032 Left lower quadrant pain: Secondary | ICD-10-CM | POA: Diagnosis not present

## 2021-09-25 DIAGNOSIS — Z743 Need for continuous supervision: Secondary | ICD-10-CM | POA: Diagnosis not present

## 2021-09-25 DIAGNOSIS — K8020 Calculus of gallbladder without cholecystitis without obstruction: Secondary | ICD-10-CM | POA: Diagnosis not present

## 2021-09-25 DIAGNOSIS — R6889 Other general symptoms and signs: Secondary | ICD-10-CM | POA: Diagnosis not present

## 2021-09-25 DIAGNOSIS — N189 Chronic kidney disease, unspecified: Secondary | ICD-10-CM | POA: Diagnosis present

## 2021-09-25 DIAGNOSIS — Z28310 Unvaccinated for covid-19: Secondary | ICD-10-CM

## 2021-09-25 DIAGNOSIS — Z9851 Tubal ligation status: Secondary | ICD-10-CM

## 2021-09-25 DIAGNOSIS — K6389 Other specified diseases of intestine: Secondary | ICD-10-CM | POA: Diagnosis not present

## 2021-09-25 DIAGNOSIS — R110 Nausea: Secondary | ICD-10-CM | POA: Diagnosis not present

## 2021-09-25 DIAGNOSIS — D72823 Leukemoid reaction: Secondary | ICD-10-CM | POA: Diagnosis not present

## 2021-09-25 DIAGNOSIS — R404 Transient alteration of awareness: Secondary | ICD-10-CM | POA: Diagnosis not present

## 2021-09-25 DIAGNOSIS — R1110 Vomiting, unspecified: Secondary | ICD-10-CM | POA: Diagnosis not present

## 2021-09-25 DIAGNOSIS — K5730 Diverticulosis of large intestine without perforation or abscess without bleeding: Secondary | ICD-10-CM | POA: Diagnosis not present

## 2021-09-25 DIAGNOSIS — I130 Hypertensive heart and chronic kidney disease with heart failure and stage 1 through stage 4 chronic kidney disease, or unspecified chronic kidney disease: Secondary | ICD-10-CM | POA: Diagnosis present

## 2021-09-25 DIAGNOSIS — I2510 Atherosclerotic heart disease of native coronary artery without angina pectoris: Secondary | ICD-10-CM | POA: Diagnosis not present

## 2021-09-25 DIAGNOSIS — I517 Cardiomegaly: Secondary | ICD-10-CM | POA: Diagnosis not present

## 2021-09-25 DIAGNOSIS — I252 Old myocardial infarction: Secondary | ICD-10-CM

## 2021-09-25 DIAGNOSIS — K5669 Other partial intestinal obstruction: Secondary | ICD-10-CM | POA: Diagnosis not present

## 2021-09-25 DIAGNOSIS — E785 Hyperlipidemia, unspecified: Secondary | ICD-10-CM | POA: Diagnosis present

## 2021-09-25 LAB — URINALYSIS, ROUTINE W REFLEX MICROSCOPIC
Bacteria, UA: NONE SEEN
Bilirubin Urine: NEGATIVE
Glucose, UA: NEGATIVE mg/dL
Hgb urine dipstick: NEGATIVE
Ketones, ur: 5 mg/dL — AB
Leukocytes,Ua: NEGATIVE
Nitrite: NEGATIVE
Protein, ur: >=300 mg/dL — AB
Specific Gravity, Urine: 1.022 (ref 1.005–1.030)
pH: 5 (ref 5.0–8.0)

## 2021-09-25 LAB — COMPREHENSIVE METABOLIC PANEL
ALT: 12 U/L (ref 0–44)
AST: 18 U/L (ref 15–41)
Albumin: 4.3 g/dL (ref 3.5–5.0)
Alkaline Phosphatase: 75 U/L (ref 38–126)
Anion gap: 11 (ref 5–15)
BUN: 25 mg/dL — ABNORMAL HIGH (ref 8–23)
CO2: 24 mmol/L (ref 22–32)
Calcium: 10.2 mg/dL (ref 8.9–10.3)
Chloride: 102 mmol/L (ref 98–111)
Creatinine, Ser: 1.04 mg/dL — ABNORMAL HIGH (ref 0.44–1.00)
GFR, Estimated: 52 mL/min — ABNORMAL LOW (ref >60)
Glucose, Bld: 153 mg/dL — ABNORMAL HIGH (ref 70–99)
Potassium: 4.1 mmol/L (ref 3.5–5.1)
Sodium: 137 mmol/L (ref 135–145)
Total Bilirubin: 1.1 mg/dL (ref 0.3–1.2)
Total Protein: 8.4 g/dL — ABNORMAL HIGH (ref 6.5–8.1)

## 2021-09-25 LAB — CBC
HCT: 43.5 % (ref 36.0–46.0)
Hemoglobin: 14.4 g/dL (ref 12.0–15.0)
MCH: 30.3 pg (ref 26.0–34.0)
MCHC: 33.1 g/dL (ref 30.0–36.0)
MCV: 91.4 fL (ref 80.0–100.0)
Platelets: 222 10*3/uL (ref 150–400)
RBC: 4.76 MIL/uL (ref 3.87–5.11)
RDW: 13.6 % (ref 11.5–15.5)
WBC: 17.1 10*3/uL — ABNORMAL HIGH (ref 4.0–10.5)
nRBC: 0 % (ref 0.0–0.2)

## 2021-09-25 LAB — LIPASE, BLOOD: Lipase: 29 U/L (ref 11–51)

## 2021-09-25 MED ORDER — LACTATED RINGERS IV BOLUS
1000.0000 mL | Freq: Once | INTRAVENOUS | Status: AC
  Administered 2021-09-25: 1000 mL via INTRAVENOUS

## 2021-09-25 MED ORDER — ONDANSETRON HCL 4 MG/2ML IJ SOLN
INTRAMUSCULAR | Status: AC
  Administered 2021-09-25: 4 mg via INTRAVENOUS
  Filled 2021-09-25: qty 2

## 2021-09-25 MED ORDER — ONDANSETRON HCL 4 MG/2ML IJ SOLN: 4.0000 mg | Freq: Once | INTRAMUSCULAR | Status: AC

## 2021-09-25 MED ORDER — LACTATED RINGERS IV SOLN: INTRAVENOUS | Status: DC

## 2021-09-25 NOTE — ED Provider Notes (Signed)
[facility] DEPT Provider Note   CSN: [id number] Arrival date & time: 09/25/21  2051     History Chief Complaint  Patient presents with   Abdominal Pain   Emesis    Katelyn Powers is a 87 y.o. female.  87 year old female presents with acute onset of emesis and abdominal pain which began today.  Patient is emesis has been slightly bilious.  No diarrhea.  No fever or chills.  Some abdominal distention noted.  No prior history of same.  No treatment use prior to arrival      Past Medical History:  Diagnosis Date   Cataract    Gout    Hyperlipidemia    Hypertension    Memory loss    NSTEMI (non-ST elevated myocardial infarction) ([place]) 08/2020   Right-sided carotid artery disease ([place]) 09/17/2020   carotid doppler wth >60% occlusion    Patient Active Problem List   Diagnosis Date Noted   Late onset Alzheimer's disease without behavioral disturbance ([place]) 02/22/2021   Stenosis of intracranial vessel 02/22/2021   CVA (cerebral vascular accident) ([place]) 11/07/2020   Hypotension 09/06/2020   NSTEMI (non-ST elevated myocardial infarction) ([place]) 09/04/2020   Vitamin D deficiency 10/27/2019   Excessive sleepiness 10/27/2019   Benign essential HTN 07/27/2018   Disorder of arteries and arterioles ([place]) 07/27/2018   Hyperlipidemia 07/27/2018   Memory loss 07/27/2018   Insomnia 07/27/2018   Abnormal brain MRI 07/27/2018   Carotid stenosis, right 07/27/2018    Past Surgical History:  Procedure Laterality Date   MELANOMA EXCISION Right 1972   level 2   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     Family History  Problem Relation Age of Onset   Colon cancer Sister 6   Colon polyps Brother     Social History   Tobacco Use   Smoking status: Former   Smokeless tobacco: Never  Scientific laboratory technician Use: Never used  Substance Use Topics   Alcohol use: No     Alcohol/week: 0.0 standard drinks   Drug use: No    Home Medications Prior to Admission medications   Medication Sig Start Date End Date Taking? Authorizing Provider  amLODipine (NORVASC) 5 MG tablet Take 1 tablet (5 mg total) by mouth daily. 09/07/20   Darreld Mclean, PA-C  aspirin 81 MG tablet Take 81 mg by mouth daily.    [provider]  Cholecalciferol (VITAMIN D3 PO) Take 1 Dose by mouth daily. Vit D3 (4,000U), Vit A, K2+ 4 immune boosters    [provider]  clopidogrel (PLAVIX) 75 MG tablet TAKE 1 TABLET BY MOUTH  DAILY 07/16/21   Sande Rives E, PA-C  ezetimibe (ZETIA) 10 MG tablet TAKE 1 TABLET BY MOUTH  DAILY 07/16/21   Belva Crome, MD  Ginkgo Biloba 120 MG CAPS Take 550 mg by mouth in the morning and at bedtime.    [provider]  losartan (COZAAR) 50 MG tablet Take 1 tablet (50 mg total) by mouth daily. 09/07/20   Darreld Mclean, PA-C  Melatonin 10 MG TABS Take 10 mg by mouth at bedtime.     [provider]  nitroGLYCERIN (NITROSTAT) 0.4 MG SL tablet Place 1 tablet (0.4 mg total) under the tongue every 5 (five) minutes as needed for chest pain. 09/07/20   Darreld Mclean, PA-C  Omega-3-acid Eth &Multivit/Min (  OMEGA-3 RX COMPLETE PO) Take by mouth.    [provider]  ondansetron (ZOFRAN ODT) 4 MG disintegrating tablet Take 1 tablet (4 mg total) by mouth every 8 (eight) hours as needed for nausea or vomiting. 04/09/21   Vanessa [place], MD  UNABLE TO FIND Take 2 tablets by mouth daily. Med Name: Living-good daily: Healthy Cholesterol Support    [provider]    Allergies    Patient has no known allergies.  Review of Systems   Review of Systems  Unable to perform ROS: Dementia   Physical Exam Updated Vital Signs BP (!) 155/63 (BP Location: Left Arm)   Pulse 84   Temp 98.4 F (36.9 C) (Oral)   Resp 17   Ht 1.575 m (5\' 2" )   Wt 70.3 kg   SpO2 99%   BMI 28.35 kg/m   Physical Exam Vitals and nursing  note reviewed.  Constitutional:      General: She is not in acute distress.    Appearance: Normal appearance. She is well-developed. She is not toxic-appearing.  HENT:     Head: Normocephalic and atraumatic.  Eyes:     General: Lids are normal.     Conjunctiva/sclera: Conjunctivae normal.     Pupils: Pupils are equal, round, and reactive to light.  Neck:     Thyroid: No thyroid mass.     Trachea: No tracheal deviation.  Cardiovascular:     Rate and Rhythm: Normal rate and regular rhythm.     Heart sounds: Normal heart sounds. No murmur heard.   No gallop.  Pulmonary:     Effort: Pulmonary effort is normal. No respiratory distress.     Breath sounds: Normal breath sounds. No stridor. No decreased breath sounds, wheezing, rhonchi or rales.  Abdominal:     General: There is distension.     Palpations: Abdomen is soft.     Tenderness: There is abdominal tenderness in the left lower quadrant. There is guarding. There is no rebound.  Musculoskeletal:        General: No tenderness. Normal range of motion.     Cervical back: Normal range of motion and neck supple.  Skin:    General: Skin is warm and dry.     Findings: No abrasion or rash.  Neurological:     Mental Status: She is alert and oriented to person, place, and time. Mental status is at baseline.     GCS: GCS eye subscore is 4. GCS verbal subscore is 5. GCS motor subscore is 6.     Cranial Nerves: No cranial nerve deficit.     Sensory: No sensory deficit.     Motor: Motor function is intact.  Psychiatric:        Attention and Perception: Attention normal.        Speech: Speech normal.        Behavior: Behavior normal.    ED Results / Procedures / Treatments   Labs (all labs ordered are listed, but only abnormal results are displayed) Labs Reviewed  LIPASE, BLOOD  COMPREHENSIVE METABOLIC PANEL  CBC  URINALYSIS, ROUTINE W REFLEX MICROSCOPIC    EKG None  Radiology No results found.  Procedures Procedures    Medications Ordered in ED Medications  lactated ringers infusion (has no administration in time range)  lactated ringers bolus 1,000 mL (has no administration in time range)  ondansetron (ZOFRAN) injection 4 mg (has no administration in time range)    ED Course  I have  reviewed the triage vital signs and the nursing notes.  Pertinent labs & imaging results that were available during my care of the patient were reviewed by me and considered in my medical decision making (see chart for details).    MDM Rules/Calculators/A&P                           Patient's labs and CT scan results noted and patient has evidence of partial small bowel obstruction.  She was given IV fluids here as well as antiemetics.  Plan will be to admit to the hospitalist service Final Clinical Impression(s) / ED Diagnoses Final diagnoses:  None    Rx / DC Orders ED Discharge Orders     None        Lacretia Leigh, MD 09/25/21 2324

## 2021-09-25 NOTE — ED Triage Notes (Addendum)
Pt BIB Ems from home. Pt states that she has had abdominal pain and vomiting x 3 hrs. Pt had a tele health visit today and was told to come to the ED. Pt reports with upper right and left abdominal pain. Hx of dementia. Pt stays with her daughter.

## 2021-09-25 NOTE — ED Triage Notes (Signed)
Patient is from home complaining of abdominal pain and vomiting for 3 hours. Pain is in the LUQ. Patient spoke with telehealth and they advised her to come to the ER. Hx of dementia.

## 2021-09-25 NOTE — Telephone Encounter (Signed)
Pt  with severe abdominal pain that began at 1600. She has vomited twice - clear and then tea that she had just drank. Pt c/o  midline intermittent abdominal pain that daughter stated iwas severe. Daughter stated abdomen is distended and tight. Onset was sudden.   Ended triage and advised daughter to call 911 as soon as we hang up. Daughter verbalized understanding and stated she will call 911.

## 2021-09-25 NOTE — Telephone Encounter (Signed)
Answer Assessment - Initial Assessment Questions 1. LOCATION: "Where does it hurt?"      Above  and lower abdominal 2. RADIATION: "Does the pain shoot anywhere else?" (e.g., chest, back)     no 3. ONSET: "When did the pain begin?" (e.g., minutes, hours or days ago)      1600 4. SUDDEN: "Gradual or sudden onset?"     sudden 5. PATTERN "Does the pain come and go, or is it constant?"    - If constant: "Is it getting better, staying the same, or worsening?"      (Note: Constant means the pain never goes away completely; most serious pain is constant and it progresses)     - If intermittent: "How long does it last?" "Do you have pain now?"     (Note: Intermittent means the pain goes away completely between bouts)     Comes and goes 6. SEVERITY: "How bad is the pain?"  (e.g., Scale 1-10; mild, moderate, or severe)   - MILD (1-3): doesn't interfere with normal activities, abdomen soft and not tender to touch    - MODERATE (4-7): interferes with normal activities or awakens from sleep, abdomen tender to touch    - SEVERE (8-10): excruciating pain, doubled over, unable to do any normal activities      severe 7. RECURRENT SYMPTOM: "Have you ever had this type of stomach pain before?" If Yes, ask: "When was the last time?" and "What happened that time?"       8. CAUSE: "What do you think is causing the stomach pain?"     *No Answer* 9. RELIEVING/AGGRAVATING FACTORS: "What makes it better or worse?" (e.g., movement, antacids, bowel movement)     *No Answer* 10. OTHER SYMPTOMS: "Do you have any other symptoms?" (e.g., back pain, diarrhea, fever, urination pain, vomiting)       nausea 11. PREGNANCY: "Is there any chance you are pregnant?" "When was your last menstrual period?"       *No Answer*  Protocols used: Abdominal Pain - [facility]

## 2021-09-25 NOTE — H&P (Signed)
Katelyn Powers UEK:[id number] DOB: November 28, 1931 DOA: 09/25/2021     PCP: Philmore Pali, NP   Outpatient Specialists:  CARDS:  PA Richardson Dopp   NEurology Dr. Felecia Shelling    Patient arrived to ER on 09/25/21 at 2051 Referred by Attending Lacretia Leigh, MD   Patient coming from: home Lives With family     Chief Complaint:   Chief Complaint  Patient presents with   Abdominal Pain   Emesis    HPI: Katelyn Powers is a 87 y.o. female with medical history significant of  HLD HTN CAD, dementia, hx of CVA, Carotid stenosis. prediabetes    Presented with   N/V and abd pain no diarrhea no fever or chills LUQ pain  Called [facility] and was told to go to ER  Hx of vaginal hysterectomy Has been constipated a little BM today She was passing gas yesterday not much today Does not smoke or drink No CP has been more fatigue lately family is worried is is not too weak to walk Has  NOt been vaccinated against COVID  no flu shots   Initial COVID TEST  NEGATIVE   Lab Results  Component Value Date   [place] NEGATIVE 09/25/2021   [place] NEGATIVE 11/08/2020   [place] NEGATIVE 09/04/2020     Regarding pertinent Chronic problems:    Hyperlipidemia -  on Zetia does not tolerate statins  Lipid Panel     Component Value Date/Time   CHOL 234 (H) 09/04/2020 0918   TRIG 220 (H) 09/04/2020 0918   HDL 52 09/04/2020 0918   CHOLHDL 4.5 09/04/2020 0918   VLDL 44 (H) 09/04/2020 0918   LDLCALC 138 (H) 09/04/2020 1505   Carotid artery stenosis - ASA, plavix.   HTN on Norvasc, cozaar   chronic CHF diastolic - last echo 6979 Grade I diastolic dysfunction (impaired relaxation).  Elevated left atrial pressure.  EF 55 to 60%.    CAD  - On Aspirin, statin, betablocker, Plavix                 -  followed by cardiology                - NSTEMI in 08/2020 >> med Rx    Dementia -  not on meds   Prediabetes - diet controled  While in ER: CT scan partial SBO leukocytosis    ED Triage  Vitals  Enc Vitals Group     BP 09/25/21 2115 (!) 155/63     Pulse Rate 09/25/21 2115 84     Resp 09/25/21 2115 17     Temp 09/25/21 2115 98.4 F (36.9 C)     Temp Source 09/25/21 2115 Oral     SpO2 09/25/21 2113 96 %     Weight 09/25/21 2115 155 lb (70.3 kg)     Height 09/25/21 2115 5\' 2"  (1.575 m)     Head Circumference --      Peak Flow --      Pain Score --      Pain Loc --      Pain Edu? --      Excl. in [place]? --   TMAX(24)@     _________________________________________ Significant initial  Findings: Abnormal Labs Reviewed  COMPREHENSIVE METABOLIC PANEL - Abnormal; Notable for the following components:      Result Value   Glucose, Bld 153 (*)    BUN 25 (*)    Creatinine, Ser 1.04 (*)    Total  Protein 8.4 (*)    GFR, Estimated 52 (*)    All other components within normal limits  CBC - Abnormal; Notable for the following components:   WBC 17.1 (*)    All other components within normal limits  URINALYSIS, ROUTINE W REFLEX MICROSCOPIC - Abnormal; Notable for the following components:   Ketones, ur 5 (*)    Protein, ur >=300 (*)    All other components within normal limits   ____________________________________________ Ordered  CXR - There are increased interstitial markings in the lung fields and mild central vascular fullness,   CTabd/pelvis - MIld partial SBO Small low-attenuation left adrenal masses which likely represent adrenal adenomas.    ECG: Ordered Personally reviewed by me showing: HR : 84 Rhythm: NSR,    no evidence of ischemic changes QTC 447 ___________   The recent clinical data is shown below. Vitals:   09/25/21 2113 09/25/21 2115 09/25/21 2219  BP:  (!) 155/63 (!) 169/80  Pulse:  84 85  Resp:  17 18  Temp:  98.4 F (36.9 C)   TempSrc:  Oral   SpO2: 96% 99% 99%  Weight:  70.3 kg   Height:  5\' 2"  (1.575 m)     WBC     Component Value Date/Time   WBC 17.1 (H) 09/25/2021 2130   LYMPHSABS 1.5 04/09/2021 1453   MONOABS 0.8 04/09/2021  1453   EOSABS 0.1 04/09/2021 1453   BASOSABS 0.1 04/09/2021 1453      UA   no evidence of UTI      Urine analysis:    Component Value Date/Time   COLORURINE YELLOW 09/25/2021 2130   APPEARANCEUR CLEAR 09/25/2021 2130   LABSPEC 1.022 09/25/2021 2130   PHURINE 5.0 09/25/2021 2130   GLUCOSEU NEGATIVE 09/25/2021 2130   HGBUR NEGATIVE 09/25/2021 2130   [place] NEGATIVE 09/25/2021 2130   KETONESUR 5 (A) 09/25/2021 2130   PROTEINUR >=300 (A) 09/25/2021 2130   NITRITE NEGATIVE 09/25/2021 2130   LEUKOCYTESUR NEGATIVE 09/25/2021 2130    Results for orders placed or performed during the hospital encounter of 09/25/21  Resp Panel by RT-PCR (Flu A&B, Covid) Nasopharyngeal Swab     Status: None   Collection Time: 09/25/21 11:21 PM   Specimen: Nasopharyngeal Swab; Nasopharyngeal(NP) swabs in vial transport medium  Result Value Ref Range Status   SARS Coronavirus 2 by RT PCR NEGATIVE NEGATIVE Final         Influenza A by PCR NEGATIVE NEGATIVE Final   Influenza B by PCR NEGATIVE NEGATIVE Final           _______________________________________________ Hospitalist was called for admission for partial SBO  The following Work up has been ordered so far:  Orders Placed This Encounter  Procedures   Resp Panel by RT-PCR (Flu A&B, Covid) Nasopharyngeal Swab   CT Abdomen Pelvis Wo Contrast   Lipase, blood   Comprehensive metabolic panel   CBC   Urinalysis, Routine w reflex microscopic   Diet NPO time specified   In and Out Cath   Consult to hospitalist     Following Medications were ordered in ER: Medications  lactated ringers infusion ( Intravenous New Bag/Given 09/25/21 2234)  lactated ringers bolus 1,000 mL (1,000 mLs Intravenous New Bag/Given 09/25/21 2233)  ondansetron (ZOFRAN) injection 4 mg (4 mg Intravenous Given 09/25/21 2233)        Consult Orders  (From admission, onward)           Start     Ordered  09/25/21 2321  Consult to hospitalist  Once        Provider:  (Not yet assigned)  Question Answer Comment  Place call to: [facility]   Reason for Consult Admit      09/25/21 2320            OTHER Significant initial  Findings:  labs showing:    Recent Labs  Lab 09/25/21 2130  NA 137  K 4.1  CO2 24  GLUCOSE 153*  BUN 25*  CREATININE 1.04*  CALCIUM 10.2    Cr     Up from baseline see below Lab Results  Component Value Date   CREATININE 1.04 (H) 09/25/2021   CREATININE 0.96 04/09/2021   CREATININE 0.93 11/07/2020    Recent Labs  Lab 09/25/21 2130  AST 18  ALT 12  ALKPHOS 75  BILITOT 1.1  PROT 8.4*  ALBUMIN 4.3   Lab Results  Component Value Date   CALCIUM 10.2 09/25/2021    Plt: Lab Results  Component Value Date   PLT 222 09/25/2021        Recent Labs  Lab 09/25/21 2130  WBC 17.1*  HGB 14.4  HCT 43.5  MCV 91.4  PLT 222    HG/HCT  stable,     Component Value Date/Time   HGB 14.4 09/25/2021 2130   HCT 43.5 09/25/2021 2130   MCV 91.4 09/25/2021 2130     Recent Labs  Lab 09/25/21 2130  LIPASE 29    Cardiac Panel (last 3 results) No results for input(s): CKTOTAL, CKMB, TROPONINI, RELINDX in the last 72 hours.   BNP (last 3 results) No results for input(s): BNP in the last 8760 hours.    DM  labs:  HbA1C: No results for input(s): HGBA1C in the last 8760 hours.     CBG (last 3)  Recent Labs    09/26/21 0058  GLUCAP 149*        Cultures: No results found for: SDES, SPECREQUEST, CULT, REPTSTATUS   Radiological Exams on Admission: CT Abdomen Pelvis Wo Contrast  Result Date: 09/25/2021 CLINICAL DATA:  Abdominal distension. EXAM: CT ABDOMEN AND PELVIS WITHOUT CONTRAST TECHNIQUE: Multidetector CT imaging of the abdomen and pelvis was performed following the standard protocol without IV contrast. COMPARISON:  None. FINDINGS: Lower chest: Mild linear atelectasis is seen within the bilateral lung bases. Hepatobiliary: No focal liver abnormality is seen. A 1.6 cm gallstone  is seen within the lumen of an otherwise normal-appearing gallbladder. Pancreas: Unremarkable. No pancreatic ductal dilatation or surrounding inflammatory changes. Spleen: Normal in size without focal abnormality. Adrenals/Urinary Tract: The right adrenal gland is normal in appearance. 1.0 cm x 0.8 cm and 1.3 cm x 0.8 cm low-attenuation left adrenal masses are noted (approximately 3.6 Hounsfield units). Kidneys are normal in size, without renal calculi or hydronephrosis. 2.5 cm diameter and 4.1 cm diameter cysts are seen within the right kidney. Bladder is unremarkable. Stomach/Bowel: There is a small hiatal hernia. Appendix appears normal. Multiple dilated small bowel loops are seen throughout the abdomen (maximum small bowel diameter of approximately 3.2 cm). A gradual transition zone is seen within the mid to lower left abdomen (axial CT images 41 through 55, CT series 2). This is within the mid to distal jejunum. Noninflamed diverticula are seen throughout the large bowel. Vascular/Lymphatic: Aortic atherosclerosis. No enlarged abdominal or pelvic lymph nodes. Reproductive: Uterus and bilateral adnexa are unremarkable. Other: No abdominal wall hernia or abnormality. No abdominopelvic ascites. Musculoskeletal: Multilevel degenerative changes are seen  throughout the lumbar spine. IMPRESSION: 1. Mid partial small bowel obstruction. 2. Cholelithiasis. 3. Colonic diverticulosis. 4. Small low-attenuation left adrenal masses which likely represent adrenal adenomas. 5. Aortic atherosclerosis. Aortic Atherosclerosis (ICD10-I70.0). Electronically Signed   By: Virgina Norfolk M.D.   On: 09/25/2021 23:19   DG CHEST PORT 1 VIEW  Result Date: 09/25/2021 CLINICAL DATA:  Nausea with leukocytosis. EXAM: PORTABLE CHEST 1 VIEW COMPARISON:  09/04/2020 portable chest FINDINGS: There is mild cardiomegaly and mild central vascular fullness. This could be due to interval low lung volumes or mild perihilar vascular congestion.  There are increased interstitial markings throughout the hypoinflated lungs, which could indicate interstitial edema, interstitial pneumonitis or changes of low inspiration. There is patchy calcification of the aorta. No dense focal pulmonary infiltrate is seen. Generalized osteopenia. Right axillary surgical clips are again noted. IMPRESSION: There are increased interstitial markings in the lung fields and mild central vascular fullness, but the lungs are near expiratory in the current exam. This could relate to changes of low inspiration or the patient could have mild perihilar vascular congestion and interstitial edema or an interstitial pneumonitis. No pleural effusion is seen. A follow-up study is recommended in full inspiration. Electronically Signed   By: Telford Nab M.D.   On: 09/25/2021 23:51   _______________________________________________________________________________________________________ Latest   Blood pressure (!) 169/80, pulse 85, temperature 98.4 F (36.9 C), temperature source Oral, resp. rate 18, height 5\' 2"  (1.575 m), weight 70.3 kg, SpO2 99 %.   Review of Systems:    Pertinent positives include:  abdominal pain, nausea, vomiting,   Constitutional:  No weight loss, night sweats, Fevers, chills, fatigue, weight loss  HEENT:  No headaches, Difficulty swallowing,Tooth/dental problems,Sore throat,  No sneezing, itching, ear ache, nasal congestion, post nasal drip,  Cardio-vascular:  No chest pain, Orthopnea, PND, anasarca, dizziness, palpitations.no Bilateral lower extremity swelling  GI:  No heartburn, indigestion, diarrhea, change in bowel habits, loss of appetite, melena, blood in stool, hematemesis Resp:  no shortness of breath at rest. No dyspnea on exertion, No excess mucus, no productive cough, No non-productive cough, No coughing up of blood.No change in color of mucus.No wheezing. Skin:  no rash or lesions. No jaundice GU:  no dysuria, change in color of  urine, no urgency or frequency. No straining to urinate.  No flank pain.  Musculoskeletal:  No joint pain or no joint swelling. No decreased range of motion. No back pain.  Psych:  No change in mood or affect. No depression or anxiety. No memory loss.  Neuro: no localizing neurological complaints, no tingling, no weakness, no double vision, no gait abnormality, no slurred speech, no confusion  All systems reviewed and apart from [place] all are negative _______________________________________________________________________________________________ Past Medical History:   Past Medical History:  Diagnosis Date   Cataract    Gout    Hyperlipidemia    Hypertension    Memory loss    NSTEMI (non-ST elevated myocardial infarction) ([place]) 08/2020   Right-sided carotid artery disease ([place]) 09/17/2020   carotid doppler wth >60% occlusion     Past Surgical History:  Procedure Laterality Date   MELANOMA EXCISION Right 1972   level 2   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 She has never used smokeless tobacco. She reports that she does not drink alcohol and does not use drugs.   Family  History:   Family History  Problem Relation Age of Onset   Colon cancer Sister 35   Colon polyps Brother    ______________________________________________________________________________________________ Allergies: No Known Allergies   Prior to Admission medications   Medication Sig Start Date End Date Taking? Authorizing Provider  amLODipine (NORVASC) 5 MG tablet Take 1 tablet (5 mg total) by mouth daily. 09/07/20   Darreld Mclean, PA-C  aspirin 81 MG tablet Take 81 mg by mouth daily.    [provider]  Cholecalciferol (VITAMIN D3 PO) Take 1 Dose by mouth daily. Vit D3 (4,000U), Vit A, K2+ 4 immune boosters    [provider]  clopidogrel (PLAVIX) 75 MG  tablet TAKE 1 TABLET BY MOUTH  DAILY 07/16/21   Sande Rives E, PA-C  ezetimibe (ZETIA) 10 MG tablet TAKE 1 TABLET BY MOUTH  DAILY 07/16/21   Belva Crome, MD  Ginkgo Biloba 120 MG CAPS Take 550 mg by mouth in the morning and at bedtime.    [provider]  losartan (COZAAR) 50 MG tablet Take 1 tablet (50 mg total) by mouth daily. 09/07/20   Darreld Mclean, PA-C  Melatonin 10 MG TABS Take 10 mg by mouth at bedtime.     [provider]  nitroGLYCERIN (NITROSTAT) 0.4 MG SL tablet Place 1 tablet (0.4 mg total) under the tongue every 5 (five) minutes as needed for chest pain. 09/07/20   Sande Rives E, PA-C  Omega-3-acid Eth &Multivit/Min (OMEGA-3 RX COMPLETE PO) Take by mouth.    [provider]  ondansetron (ZOFRAN ODT) 4 MG disintegrating tablet Take 1 tablet (4 mg total) by mouth every 8 (eight) hours as needed for nausea or vomiting. 04/09/21   Vanessa , MD  UNABLE TO FIND Take 2 tablets by mouth daily. Med Name: Living-good daily: Healthy Cholesterol Support    [provider]    ___________________________________________________________________________________________________ Physical Exam: Vitals with BMI 09/25/2021 09/25/2021 04/09/2021  Height - 5\' 2"  -  Weight - 155 lbs -  BMI - 12.45 -  Systolic [phone]  Diastolic 80 63 93  Pulse 85 84 86     1. General:  in No  Acute distress   Chronically ill  -appearing 2. Psychological: Alert and   Oriented to self 3. Head/ENT:    Dry Mucous Membranes                          Head Non traumatic, neck supple                           Poor Dentition 4. SKIN:  decreased Skin turgor,  Skin clean Dry and intact no rash 5. Heart: Regular rate and rhythm systolic Murmur, no Rub or gallop 6. Lungs:  no wheezes or crackles   7. Abdomen: Soft,  non-tender,   distended   obese  bowel sounds present 8. Lower extremities: no clubbing, cyanosis, no  edema 9. Neurologically Grossly intact, moving  all 4 extremities equally  10. MSK: Normal range of motion    Chart has been reviewed  ______________________________________________________________________________________________  Assessment/Plan  87 y.o. female with medical history significant of  HLD HTN CAD, dementia, hx of CVA, Carotid stenosis   Admitted for partial SBO  Present on Admission:  SBO (small bowel obstruction) ([place]) -  Likely cause unknown  - admit for conservative management  - NG tube - NPO - KUB  in AM  If no improvement consult general surgery   Late onset Alzheimer's disease without behavioral disturbance (HCC) - moniotr for any sundowning   Hyperlipidemia - restrt when able to tolerate  Abnormal CXR - with congestion vs pneumonitis no hypoxia Check procalcitonin, given SBO with hx of N/V and leukocytosis Will cover with unasyn for possible aspiration  Heart murmur - will order echo  Elevated LActic acid - in the setting of n/V SBO cont with gentle rehydraiton  Dm 2 -  - Order Sensitive   SSI    -  check TSH and HgA1C    CAD- stable not endorsing any CP, hold PO meds for tonight  Hx of Carotid stenosis - on double therapy on hold while unable to take PO  Other plan as per orders.  DVT prophylaxis:  SCD      Code Status:    Code Status: Prior FULL CODE  as per patient  I had personally discussed CODE STATUS with patient     Family Communication:   Family    Bedside  plan of care was discussed  with   Daughter   Disposition Plan:     To home once workup is complete and patient is stable   Following barriers for discharge:                                                          Pain controlled with PO medications                                               Will need to be able to tolerate PO                            Will likely need home health, home O2, set up                         Would benefit from PT/OT eval prior to DC  Ordered                      Consults called:  none  Admission status:  ED Disposition     ED Disposition  Admit   Condition  --   [place]: Mariano Colon [[id number]]  Level of Care: Med-Surg [16]  May admit patient to [facility] or [facility] if equivalent level of care is available:: No  Covid Evaluation: Asymptomatic Screening Protocol (No Symptoms)  Diagnosis: SBO (small bowel obstruction) [facility]) [[id number]]  Admitting Physician: Toy Baker [[id number]]  Attending Physician: Toy Baker [[id number]]  Estimated length of stay: past midnight tomorrow  Certification:: I certify this patient will need inpatient services for at least 2 midnights           inpatient     I Expect 2 midnight stay secondary to severity of patient's current illness need for inpatient interventions justified by the following:  Severe lab/radiological/exam abnormalities including:  SBO   and extensive comorbidities including:    CHF  CAD  CKD     That are currently affecting medical  management.   I expect  patient to be hospitalized for 2 midnights requiring inpatient medical care.  Patient is at high risk for adverse outcome (such as loss of life or disability) if not treated.  Indication for inpatient stay as follows:   Hemodynamic instability despite maximal medical therapy,    severe pain requiring acute inpatient management,  inability to maintain oral hydration    Need for IV fluids,     Level of care     medical floor     Lab Results  Component Value Date   [place] 09/25/2021     Precautions: admitted as   Covid Negative     PPE: Used by the provider:   N95  eye Goggles,  Gloves    Ozie Dimaria 09/26/2021, 2:42 AM    [facility]     after 2 AM please page floor coverage PA If 7AM-7PM, please contact the day team taking care of the patient using [email or web address]   Patient was evaluated in the context of the global COVID-19 pandemic, which necessitated  consideration that the patient might be at risk for infection with the SARS-CoV-2 virus that causes COVID-19. Institutional protocols and algorithms that pertain to the evaluation of patients at risk for COVID-19 are in a state of rapid change based on information released by regulatory bodies including the CDC and federal and state organizations. These policies and algorithms were followed during the patient's care.

## 2021-09-26 ENCOUNTER — Encounter (HOSPITAL_COMMUNITY): Payer: Self-pay | Admitting: Internal Medicine

## 2021-09-26 ENCOUNTER — Inpatient Hospital Stay (HOSPITAL_COMMUNITY): Payer: Medicare Other

## 2021-09-26 DIAGNOSIS — R011 Cardiac murmur, unspecified: Secondary | ICD-10-CM | POA: Diagnosis not present

## 2021-09-26 LAB — COMPREHENSIVE METABOLIC PANEL
ALT: 11 U/L (ref 0–44)
AST: 18 U/L (ref 15–41)
Albumin: 3.4 g/dL — ABNORMAL LOW (ref 3.5–5.0)
Alkaline Phosphatase: 61 U/L (ref 38–126)
Anion gap: 5 (ref 5–15)
BUN: 20 mg/dL (ref 8–23)
CO2: 26 mmol/L (ref 22–32)
Calcium: 9 mg/dL (ref 8.9–10.3)
Chloride: 107 mmol/L (ref 98–111)
Creatinine, Ser: 1.03 mg/dL — ABNORMAL HIGH (ref 0.44–1.00)
GFR, Estimated: 53 mL/min — ABNORMAL LOW (ref >60)
Glucose, Bld: 115 mg/dL — ABNORMAL HIGH (ref 70–99)
Potassium: 4 mmol/L (ref 3.5–5.1)
Sodium: 138 mmol/L (ref 135–145)
Total Bilirubin: 0.7 mg/dL (ref 0.3–1.2)
Total Protein: 6.6 g/dL (ref 6.5–8.1)

## 2021-09-26 LAB — CBC WITH DIFFERENTIAL/PLATELET
Abs Immature Granulocytes: 0.04 10*3/uL (ref 0.00–0.07)
Basophils Absolute: 0 10*3/uL (ref 0.0–0.1)
Basophils Relative: 0 %
Eosinophils Absolute: 0.1 10*3/uL (ref 0.0–0.5)
Eosinophils Relative: 1 %
HCT: 36 % (ref 36.0–46.0)
Hemoglobin: 11.8 g/dL — ABNORMAL LOW (ref 12.0–15.0)
Immature Granulocytes: 0 %
Lymphocytes Relative: 11 %
Lymphs Abs: 1.3 10*3/uL (ref 0.7–4.0)
MCH: 30.3 pg (ref 26.0–34.0)
MCHC: 32.8 g/dL (ref 30.0–36.0)
MCV: 92.5 fL (ref 80.0–100.0)
Monocytes Absolute: 1.1 10*3/uL — ABNORMAL HIGH (ref 0.1–1.0)
Monocytes Relative: 9 %
Neutro Abs: 8.9 10*3/uL — ABNORMAL HIGH (ref 1.7–7.7)
Neutrophils Relative %: 79 %
Platelets: 201 10*3/uL (ref 150–400)
RBC: 3.89 MIL/uL (ref 3.87–5.11)
RDW: 13.8 % (ref 11.5–15.5)
WBC: 11.5 10*3/uL — ABNORMAL HIGH (ref 4.0–10.5)
nRBC: 0 % (ref 0.0–0.2)

## 2021-09-26 LAB — PROCALCITONIN
Procalcitonin: <0.1 ng/mL
Procalcitonin: <0.1 ng/mL

## 2021-09-26 LAB — DIFFERENTIAL
Abs Immature Granulocytes: 0.08 10*3/uL — ABNORMAL HIGH (ref 0.00–0.07)
Basophils Absolute: 0.1 10*3/uL (ref 0.0–0.1)
Basophils Relative: 0 %
Eosinophils Absolute: 0 10*3/uL (ref 0.0–0.5)
Eosinophils Relative: 0 %
Immature Granulocytes: 1 %
Lymphocytes Relative: 6 %
Lymphs Abs: 0.8 10*3/uL (ref 0.7–4.0)
Monocytes Absolute: 0.5 10*3/uL (ref 0.1–1.0)
Monocytes Relative: 3 %
Neutro Abs: 13.3 10*3/uL — ABNORMAL HIGH (ref 1.7–7.7)
Neutrophils Relative %: 90 %

## 2021-09-26 LAB — GLUCOSE, CAPILLARY
Glucose-Capillary: 134 mg/dL — ABNORMAL HIGH (ref 70–99)
Glucose-Capillary: 144 mg/dL — ABNORMAL HIGH (ref 70–99)
Glucose-Capillary: 88 mg/dL (ref 70–99)
Glucose-Capillary: 99 mg/dL (ref 70–99)

## 2021-09-26 LAB — ECHOCARDIOGRAM COMPLETE
AR max vel: 1.06 cm2
AV Area VTI: 0.87 cm2
AV Area mean vel: 0.88 cm2
AV Mean grad: 11 mmHg
AV Peak grad: 18.4 mmHg
Ao pk vel: 2.15 m/s
Area-P 1/2: 4.77 cm2
Height: 62 in
S' Lateral: 2.8 cm
Weight: 2480 oz

## 2021-09-26 LAB — RESP PANEL BY RT-PCR (FLU A&B, COVID) ARPGX2
Influenza A by PCR: NEGATIVE
Influenza B by PCR: NEGATIVE
SARS Coronavirus 2 by RT PCR: NEGATIVE

## 2021-09-26 LAB — BRAIN NATRIURETIC PEPTIDE: B Natriuretic Peptide: 108.6 pg/mL — ABNORMAL HIGH (ref 0.0–100.0)

## 2021-09-26 LAB — LACTIC ACID, PLASMA
Lactic Acid, Venous: 2.6 mmol/L (ref 0.5–1.9)
Lactic Acid, Venous: 2.3 mmol/L (ref 0.5–1.9)

## 2021-09-26 LAB — MAGNESIUM
Magnesium: 2 mg/dL (ref 1.7–2.4)
Magnesium: 2.1 mg/dL (ref 1.7–2.4)

## 2021-09-26 LAB — PHOSPHORUS: Phosphorus: 3.3 mg/dL (ref 2.5–4.6)

## 2021-09-26 LAB — CBG MONITORING, ED
Glucose-Capillary: 102 mg/dL — ABNORMAL HIGH (ref 70–99)
Glucose-Capillary: 121 mg/dL — ABNORMAL HIGH (ref 70–99)
Glucose-Capillary: 149 mg/dL — ABNORMAL HIGH (ref 70–99)

## 2021-09-26 LAB — CK: Total CK: 44 U/L (ref 38–234)

## 2021-09-26 MED ORDER — SODIUM CHLORIDE 0.9 % IV SOLN
3.0000 g | Freq: Three times a day (TID) | INTRAVENOUS | Status: DC
  Administered 2021-09-26 – 2021-09-27 (×5): 3 g via INTRAVENOUS
  Filled 2021-09-26 (×6): qty 8

## 2021-09-26 MED ORDER — MELATONIN 3 MG PO TABS
3.0000 mg | ORAL_TABLET | Freq: Every day | ORAL | Status: DC
  Administered 2021-09-26 (×2): 3 mg via ORAL
  Filled 2021-09-26 (×2): qty 1

## 2021-09-26 MED ORDER — INSULIN ASPART 100 UNIT/ML IJ SOLN
0.0000 [IU] | INTRAMUSCULAR | Status: DC
  Administered 2021-09-26 – 2021-09-27 (×3): 1 [IU] via SUBCUTANEOUS
  Filled 2021-09-26: qty 0.09

## 2021-09-26 MED ORDER — ACETAMINOPHEN 650 MG RE SUPP: 650.0000 mg | Freq: Four times a day (QID) | RECTAL | Status: DC | PRN

## 2021-09-26 MED ORDER — SODIUM CHLORIDE 0.9 % IV SOLN
75.0000 mL/h | INTRAVENOUS | Status: DC
  Administered 2021-09-26: 75 mL/h via INTRAVENOUS

## 2021-09-26 MED ORDER — ACETAMINOPHEN 325 MG PO TABS: 650.0000 mg | ORAL_TABLET | Freq: Four times a day (QID) | ORAL | Status: DC | PRN

## 2021-09-26 MED ORDER — ONDANSETRON 4 MG PO TBDP
4.0000 mg | ORAL_TABLET | Freq: Once | ORAL | Status: AC
  Administered 2021-09-26: 4 mg via ORAL
  Filled 2021-09-26: qty 1

## 2021-09-26 MED ORDER — AMLODIPINE BESYLATE 5 MG PO TABS
5.0000 mg | ORAL_TABLET | Freq: Every day | ORAL | Status: DC
  Administered 2021-09-26 – 2021-09-27 (×2): 5 mg via ORAL
  Filled 2021-09-26: qty 1

## 2021-09-26 MED ORDER — HYDROCODONE-ACETAMINOPHEN 5-325 MG PO TABS: 1.0000 | ORAL_TABLET | ORAL | Status: DC | PRN

## 2021-09-26 MED ORDER — SODIUM CHLORIDE 0.9 % IV BOLUS
500.0000 mL | Freq: Once | INTRAVENOUS | Status: AC
  Administered 2021-09-26: 500 mL via INTRAVENOUS

## 2021-09-26 NOTE — Progress Notes (Signed)
  Echocardiogram 2D Echocardiogram has been performed.  Darlina Sicilian M 09/26/2021, 10:37 AM

## 2021-09-26 NOTE — ED Notes (Signed)
Patient pulled out IV.

## 2021-09-26 NOTE — Progress Notes (Signed)
PROGRESS NOTE    AUSTRALIA DROLL  TFT:[id number] DOB: 10-03-1932 DOA: 09/25/2021 PCP: Katelyn Pali, NP    Chief Complaint  Patient presents with   Abdominal Pain   Emesis    Brief Narrative:  87 year old lady with prior history of hypertension, hyperlipidemia, dementia, carotid stenosis, history of CVA, dementia, coronary artery disease presents with nausea vomiting and abdominal pain, distention and diarrhea.  CT of the abdomen showed partial SBO. Assessment & Plan:   Principal Problem:   SBO (small bowel obstruction) (HCC) Active Problems:   Hyperlipidemia   Late onset Alzheimer's disease without behavioral disturbance (HCC)    SBO, Unclear etiology, repeat x-rays this morning showed nonobstructive gas pattern. General surgery consulted and appreciate recommendations.  Patient was started on clear liquid diet and advance as tolerated.  Recommend and encourage getting out of bed and walking.  So far no flatulence as per the patient.     Dementia No behavioral abnormalities    Abnormal chest x-ray Suspect aspiration pneumonia:  Continue with IV unasyn.     Hyperlipidemia:  Resume statin.    H/o CVA;     Diabetes mellitus:  CBG (last 3)  Recent Labs    09/26/21 0410 09/26/21 0818 09/26/21 1141  GLUCAP 121* 102* 99   Resume SSI.    Hypertension:  Well controlled on norvasc.    DVT prophylaxis: (Lovenox/) Code Status: (Full code) Family Communication: family at bedside.  Disposition:   Status is: Inpatient  Remains inpatient appropriate because: IV antibiotics.        Consultants:  Surgery.    Procedures: none.   Antimicrobials:  Antibiotics Given (last 72 hours)     Date/Time Action Medication Dose Rate   09/26/21 0330 New Bag/Given   Ampicillin-Sulbactam (UNASYN) 3 g in sodium chloride 0.9 % 100 mL IVPB 3 g 200 mL/hr   09/26/21 1026 New Bag/Given   Ampicillin-Sulbactam (UNASYN) 3 g in sodium chloride 0.9 % 100 mL IVPB 3 g 200  mL/hr        Subjective: Reports feeling better, abd distention is better. No nausea, vomiting.  Not passing flatulence.  No sob.   Objective: Vitals:   09/26/21 1028 09/26/21 1030 09/26/21 1144 09/26/21 1220  BP: (!) 161/82 (!) 141/84 (!) 156/78   Pulse:  82 75   Resp:  16 16 18   Temp:  98.2 F (36.8 C) 98.1 F (36.7 C)   TempSrc:  Oral Oral   SpO2:  97% 97%   Weight:      Height:        Intake/Output Summary (Last 24 hours) at 09/26/2021 1410 Last data filed at 09/26/2021 1056 Gross per 24 hour  Intake 607.26 ml  Output --  Net 607.26 ml   Filed Weights   09/25/21 2115  Weight: 70.3 kg    Examination:  General exam: Appears calm and comfortable  Respiratory system: Clear to auscultation. Respiratory effort normal. Cardiovascular system: S1 & S2 heard, RRR. No JVD, murmer present.  No pedal edema. Gastrointestinal system: Abdomen is nondistended, soft and nontender. Normal bowel sounds heard. Central nervous system: Alert and oriented. No focal neurological deficits. Extremities: Symmetric 5 x 5 power. Skin: No rashes, lesions or ulcers Psychiatry: Mood & affect appropriate.     Data Reviewed: I have personally reviewed following labs and imaging studies  CBC: Recent Labs  Lab 09/25/21 2130 09/26/21 0905  WBC 17.1* 11.5*  NEUTROABS 13.3* 8.9*  HGB 14.4 11.8*  HCT 43.5 36.0  MCV  91.4 92.5  PLT 222 324    Basic Metabolic Panel: Recent Labs  Lab 09/25/21 2130 09/26/21 0905  NA 137 138  K 4.1 4.0  CL 102 107  CO2 24 26  GLUCOSE 153* 115*  BUN 25* 20  CREATININE 1.04* 1.03*  CALCIUM 10.2 9.0  MG 2.0 2.1  PHOS 3.3  --     GFR: Estimated Creatinine Clearance: 35.4 mL/min (A) (by C-G formula based on SCr of 1.03 mg/dL (H)).  Liver Function Tests: Recent Labs  Lab 09/25/21 2130 09/26/21 0905  AST 18 18  ALT 12 11  ALKPHOS 75 61  BILITOT 1.1 0.7  PROT 8.4* 6.6  ALBUMIN 4.3 3.4*    CBG: Recent Labs  Lab 09/26/21 0058  09/26/21 0410 09/26/21 0818 09/26/21 1141  GLUCAP 149* 121* 102* 99     Recent Results (from the past 240 hour(s))  Resp Panel by RT-PCR (Flu A&B, Covid) Nasopharyngeal Swab     Status: None   Collection Time: 09/25/21 11:21 PM   Specimen: Nasopharyngeal Swab; Nasopharyngeal(NP) swabs in vial transport medium  Result Value Ref Range Status   SARS Coronavirus 2 by RT PCR NEGATIVE NEGATIVE Final    Comment: (NOTE) SARS-CoV-2 target nucleic acids are NOT DETECTED.  The SARS-CoV-2 RNA is generally detectable in upper respiratory specimens during the acute phase of infection. The lowest concentration of SARS-CoV-2 viral copies this assay can detect is 138 copies/mL. A negative result does not preclude SARS-Cov-2 infection and should not be used as the sole basis for treatment or other patient management decisions. A negative result may occur with  improper specimen collection/handling, submission of specimen other than nasopharyngeal swab, presence of viral mutation(s) within the areas targeted by this assay, and inadequate number of viral copies(<138 copies/mL). A negative result must be combined with clinical observations, patient history, and epidemiological information. The expected result is Negative.  Fact Sheet for Patients:  EntrepreneurPulse.com.au  Fact Sheet for Healthcare Providers:  IncredibleEmployment.be  This test is no t yet approved or cleared by the [place] FDA and  has been authorized for detection and/or diagnosis of SARS-CoV-2 by FDA under an Emergency Use Authorization (EUA). This EUA will remain  in effect (meaning this test can be used) for the duration of the COVID-19 declaration under Section 564(b)(1) of the Act, 21 U.S.C.section 360bbb-3(b)(1), unless the authorization is terminated  or revoked sooner.       Influenza A by PCR NEGATIVE NEGATIVE Final   Influenza B by PCR NEGATIVE NEGATIVE Final     Comment: (NOTE) The Xpert Xpress SARS-CoV-2/FLU/RSV plus assay is intended as an aid in the diagnosis of influenza from Nasopharyngeal swab specimens and should not be used as a sole basis for treatment. Nasal washings and aspirates are unacceptable for Xpert Xpress SARS-CoV-2/FLU/RSV testing.  Fact Sheet for Patients: EntrepreneurPulse.com.au  Fact Sheet for Healthcare Providers: IncredibleEmployment.be  This test is not yet approved or cleared by the [place] FDA and has been authorized for detection and/or diagnosis of SARS-CoV-2 by FDA under an Emergency Use Authorization (EUA). This EUA will remain in effect (meaning this test can be used) for the duration of the COVID-19 declaration under Section 564(b)(1) of the Act, 21 U.S.C. section 360bbb-3(b)(1), unless the authorization is terminated or revoked.  Performed at [facility], [place] [address] [place], [place] [address]          Radiology Studies: CT Abdomen Pelvis Wo Contrast  Result Date: 09/25/2021 CLINICAL DATA:  Abdominal  distension. EXAM: CT ABDOMEN AND PELVIS WITHOUT CONTRAST TECHNIQUE: Multidetector CT imaging of the abdomen and pelvis was performed following the standard protocol without IV contrast. COMPARISON:  None. FINDINGS: Lower chest: Mild linear atelectasis is seen within the bilateral lung bases. Hepatobiliary: No focal liver abnormality is seen. A 1.6 cm gallstone is seen within the lumen of an otherwise normal-appearing gallbladder. Pancreas: Unremarkable. No pancreatic ductal dilatation or surrounding inflammatory changes. Spleen: Normal in size without focal abnormality. Adrenals/Urinary Tract: The right adrenal gland is normal in appearance. 1.0 cm x 0.8 cm and 1.3 cm x 0.8 cm low-attenuation left adrenal masses are noted (approximately 3.6 Hounsfield units). Kidneys are normal in size, without renal calculi or hydronephrosis. 2.5 cm diameter  and 4.1 cm diameter cysts are seen within the right kidney. Bladder is unremarkable. Stomach/Bowel: There is a small hiatal hernia. Appendix appears normal. Multiple dilated small bowel loops are seen throughout the abdomen (maximum small bowel diameter of approximately 3.2 cm). A gradual transition zone is seen within the mid to lower left abdomen (axial CT images 41 through 55, CT series 2). This is within the mid to distal jejunum. Noninflamed diverticula are seen throughout the large bowel. Vascular/Lymphatic: Aortic atherosclerosis. No enlarged abdominal or pelvic lymph nodes. Reproductive: Uterus and bilateral adnexa are unremarkable. Other: No abdominal wall hernia or abnormality. No abdominopelvic ascites. Musculoskeletal: Multilevel degenerative changes are seen throughout the lumbar spine. IMPRESSION: 1. Mid partial small bowel obstruction. 2. Cholelithiasis. 3. Colonic diverticulosis. 4. Small low-attenuation left adrenal masses which likely represent adrenal adenomas. 5. Aortic atherosclerosis. Aortic Atherosclerosis (ICD10-I70.0). Electronically Signed   By: Virgina Norfolk M.D.   On: 09/25/2021 23:19   DG Abd 1 View  Result Date: 09/26/2021 CLINICAL DATA:  Small bowel obstruction. Abdominal pain and vomiting EXAM: ABDOMEN - 1 VIEW COMPARISON:  04/09/2021 FINDINGS: Supine views of the abdomen were obtained. Nonobstructive bowel gas pattern. Again noted is levoscoliosis in the lumbar spine. Small amount of gas in the stomach. Stool in the left upper quadrant of the abdomen. IMPRESSION: Nonobstructive bowel gas pattern. Electronically Signed   By: Markus Daft M.D.   On: 09/26/2021 08:59   DG CHEST PORT 1 VIEW  Result Date: 09/25/2021 CLINICAL DATA:  Nausea with leukocytosis. EXAM: PORTABLE CHEST 1 VIEW COMPARISON:  09/04/2020 portable chest FINDINGS: There is mild cardiomegaly and mild central vascular fullness. This could be due to interval low lung volumes or mild perihilar vascular  congestion. There are increased interstitial markings throughout the hypoinflated lungs, which could indicate interstitial edema, interstitial pneumonitis or changes of low inspiration. There is patchy calcification of the aorta. No dense focal pulmonary infiltrate is seen. Generalized osteopenia. Right axillary surgical clips are again noted. IMPRESSION: There are increased interstitial markings in the lung fields and mild central vascular fullness, but the lungs are near expiratory in the current exam. This could relate to changes of low inspiration or the patient could have mild perihilar vascular congestion and interstitial edema or an interstitial pneumonitis. No pleural effusion is seen. A follow-up study is recommended in full inspiration. Electronically Signed   By: Telford Nab M.D.   On: 09/25/2021 23:51   ECHOCARDIOGRAM COMPLETE  Result Date: 09/26/2021    ECHOCARDIOGRAM REPORT   Patient Name:   Katelyn Powers Date of Exam: 09/26/2021 Medical Rec #:  [id number]   Height:       62.0 in Accession #:    [id number]  Weight:       155.0 lb Date of  Birth:  Aug 18, 1932  BSA:          1.715 m Patient Age:    72 years    BP:           161/82 mmHg Patient Gender: F           HR:           75 bpm. Exam Location:  Inpatient Procedure: 2D Echo, 3D Echo, Cardiac Doppler and Color Doppler Indications:    Murmur R01.1  History:        Patient has prior history of Echocardiogram examinations, most                 recent 09/06/2020. CHF, CAD and Previous Myocardial Infarction,                 Carotid Disease; Risk Factors:Hypertension and Dyslipidemia.  Sonographer:    Darlina Sicilian RDCS Referring Phys: [id number] ANASTASSIA DOUTOVA  Sonographer Comments: Global longitudinal strain was attempted. IMPRESSIONS  1. Left ventricular ejection fraction, by estimation, is 55 to 60%. The left ventricle has normal function. The left ventricle has no regional wall motion abnormalities. Impaired relaxation with elevated left atrial  pressure.  2. Right ventricular systolic function is normal. The right ventricular size is normal.  3. The mitral valve is normal in structure. Mild to moderate mitral valve regurgitation. No evidence of mitral stenosis.  4. The aortic valve is calcified. Aortic valve regurgitation is mild. Paradoxical moderate low flow low gradient aortic stenosis. AVA by VTI 0.87 cm2, Aortic valve mean gradient measures 11.0 mmHg. Aortic valve Vmax measures 2.14 m/s, DI 0.34, SVI 26.  5. The inferior vena cava is normal in size with greater than 50% respiratory variability, suggesting right atrial pressure of 3 mmHg. FINDINGS  Left Ventricle: Left ventricular ejection fraction, by estimation, is 55 to 60%. The left ventricle has normal function. The left ventricle has no regional wall motion abnormalities. The left ventricular internal cavity size was normal in size. There is  no left ventricular hypertrophy. Impaired relaxation with elevated left atrial pressure. Right Ventricle: The right ventricular size is normal. No increase in right ventricular wall thickness. Right ventricular systolic function is normal. Left Atrium: Left atrial size was normal in size. Right Atrium: Right atrial size was normal in size. Pericardium: There is no evidence of pericardial effusion. Mitral Valve: The mitral valve is normal in structure. Mild to moderate mitral valve regurgitation. No evidence of mitral valve stenosis. Tricuspid Valve: The tricuspid valve is normal in structure. Tricuspid valve regurgitation is not demonstrated. No evidence of tricuspid stenosis. Aortic Valve: The aortic valve is calcified. Aortic valve regurgitation is mild. Paradoxical moderate low flow low gradient aortic stenosis. Aortic valve mean gradient measures 11.0 mmHg. Aortic valve peak gradient measures 18.4 mmHg. Aortic valve area, by VTI measures 0.87 cm. Pulmonic Valve: The pulmonic valve was normal in structure. Pulmonic valve regurgitation is not visualized.  No evidence of pulmonic stenosis. Aorta: The aortic root is normal in size and structure. Venous: The inferior vena cava is normal in size with greater than 50% respiratory variability, suggesting right atrial pressure of 3 mmHg. IAS/Shunts: No atrial level shunt detected by color flow Doppler.  LEFT VENTRICLE PLAX 2D LVIDd:         3.95 cm   Diastology LVIDs:         2.80 cm   LV e' medial:    3.41 cm/s LV PW:  0.90 cm   LV E/e' medial:  31.7 LV IVS:        1.00 cm   LV e' lateral:   4.22 cm/s LVOT diam:     1.80 cm   LV E/e' lateral: 25.6 LV SV:         45 LV SV Index:   26 LVOT Area:     2.54 cm  RIGHT VENTRICLE RV S prime:     11.60 cm/s TAPSE (M-mode): 1.8 cm LEFT ATRIUM           Index        RIGHT ATRIUM          Index LA diam:      3.40 cm 1.98 cm/m   RA Area:     9.95 cm LA Vol (A4C): 34.9 ml 20.34 ml/m  RA Volume:   20.70 ml 12.07 ml/m  AORTIC VALVE AV Area (Vmax):    1.06 cm AV Area (Vmean):   0.88 cm AV Area (VTI):     0.87 cm AV Vmax:           214.50 cm/s AV Vmean:          154.500 cm/s AV VTI:            0.514 m AV Peak Grad:      18.4 mmHg AV Mean Grad:      11.0 mmHg LVOT Vmax:         89.40 cm/s LVOT Vmean:        53.300 cm/s LVOT VTI:          0.175 m LVOT/AV VTI ratio: 0.34  AORTA Ao Root diam: 3.20 cm Ao Asc diam:  3.20 cm MITRAL VALVE MV Area (PHT): 4.77 cm     SHUNTS MV Decel Time: 159 msec     Systemic VTI:  0.18 m MV E velocity: 108.00 cm/s  Systemic Diam: 1.80 cm MV A velocity: 124.00 cm/s MV E/A ratio:  0.87 Kardie Tobb DO Electronically signed by Berniece Salines DO Signature Date/Time: 09/26/2021/12:49:20 PM    Final         Scheduled Meds:  amLODipine  5 mg Oral Daily   insulin aspart  0-9 Units Subcutaneous Q4H   melatonin  3 mg Oral QHS   Continuous Infusions:  ampicillin-sulbactam (UNASYN) IV Stopped (09/26/21 1056)   lactated ringers 75 mL/hr at 09/26/21 1353     LOS: 1 day      Hosie Poisson, MD [facility]   To contact the attending  provider between 7A-7P or the covering provider during after hours 7P-7A, please log into the web site [email or web address] and access using universal Roy password for that web site. If you do not have the password, please call the hospital operator.  09/26/2021, 2:10 PM

## 2021-09-26 NOTE — Evaluation (Signed)
Physical Therapy Evaluation Patient Details Name: Katelyn Powers MRN: [id number] DOB: April 14, 1932 Today's Date: 09/26/2021  History of Present Illness  Patient is 87 y.o. female presented to hospital with nausea, vomiting, abdominal pain, and diarrhea. CT revelaed partial SBO. PMH significant for HTN, HLD, dementia, CVA, CAD, carotid stenosis.   Clinical Impression  Katelyn Powers is 87 y.o. female admitted with above HPI and diagnosis. Patient is currently limited by functional impairments below (see PT problem list). Patient lives with her daughters and alternates houses and is independent at baseline. Patient is mobilizing at supervision level with no device. Patient will benefit from continued skilled PT interventions to address impairments and progress independence with mobility, recommending HHPT follow up. Patient is mobilizing at safe level to ambulate with RN/NT or family during her stay. PT is signing off. Thank you for this referral.      Recommendations for follow up therapy are one component of a multi-disciplinary discharge planning process, led by the attending physician.  Recommendations may be updated based on patient status, additional functional criteria and insurance authorization.  Follow Up Recommendations Home health PT    Assistance Recommended at Discharge Set up Supervision/Assistance  Functional Status Assessment Patient has had a recent decline in their functional status and demonstrates the ability to make significant improvements in function in a reasonable and predictable amount of time.  Equipment Recommendations  None recommended by PT    Recommendations for Other Services       Precautions / Restrictions Precautions Precautions: Fall Restrictions Weight Bearing Restrictions: No      Mobility  Bed Mobility Overal bed mobility: Needs Assistance Bed Mobility: Supine to Sit     Supine to sit: Supervision     General bed mobility comments: supervision  for safety    Transfers Overall transfer level: Needs assistance Equipment used: None Transfers: Sit to/from Stand Sit to Stand: Min guard;Supervision           General transfer comment: guard/supervision for safety, pt required extra effort to rise from soft EOB surface and used grab bar in bathroom    Ambulation/Gait Ambulation/Gait assistance: Min guard;Supervision Gait Distance (Feet): 170 Feet Assistive device: None;1 person hand held assist Gait Pattern/deviations: Step-through pattern;Decreased stride length;Shuffle Gait velocity: fair     General Gait Details: overall steady gait with no LOB. pt with shuffled but quick steps initially and cues to slow down steps for more cautious gait. pt asking therapist to hold hands while walking but no assist needed.  Stairs            Wheelchair Mobility    Modified Rankin (Stroke Patients Only)       Balance Overall balance assessment: Needs assistance Sitting-balance support: Feet supported Sitting balance-Leahy Scale: Good     Standing balance support: Reliant on assistive device for balance;During functional activity;Bilateral upper extremity supported Standing balance-Leahy Scale: Fair                               Pertinent Vitals/Pain Pain Assessment: No/denies pain    Home Living Family/patient expects to be discharged to:: Private residence Living Arrangements: Children Available Help at Discharge: Family;Available 24 hours/day Type of Home: House Home Access: Stairs to enter Entrance Stairs-Rails: Psychiatric nurse of Steps: 5   Home Layout: One level Home Equipment: Conservation officer, nature (2 wheels);Cane - single point;Shower seat      Prior Function Prior Level of Function : Independent/Modified  Independent                     Hand Dominance   Dominant Hand: Right    Extremity/Trunk Assessment   Upper Extremity Assessment Upper Extremity Assessment:  Overall WFL for tasks assessed    Lower Extremity Assessment Lower Extremity Assessment: Overall WFL for tasks assessed    Cervical / Trunk Assessment Cervical / Trunk Assessment: Normal  Communication   Communication: No difficulties  Cognition Arousal/Alertness: Awake/alert Behavior During Therapy: WFL for tasks assessed/performed Overall Cognitive Status: Within Functional Limits for tasks assessed                                          General Comments      Exercises     Assessment/Plan    PT Assessment All further PT needs can be met in the next venue of care  PT Problem List Decreased strength;Decreased balance;Decreased mobility;Decreased knowledge of use of DME;Decreased activity tolerance       PT Treatment Interventions      PT Goals (Current goals can be found in the Care Plan section)  Acute Rehab PT Goals Patient Stated Goal: to get back home and stay independent PT Goal Formulation: All assessment and education complete, DC therapy Time For Goal Achievement: 09/26/21 Potential to Achieve Goals: Good    Frequency     Barriers to discharge        Co-evaluation               AM-PAC PT "6 Clicks" Mobility  Outcome Measure Help needed turning from your back to your side while in a flat bed without using bedrails?: None Help needed moving from lying on your back to sitting on the side of a flat bed without using bedrails?: None Help needed moving to and from a bed to a chair (including a wheelchair)?: A Little Help needed standing up from a chair using your arms (e.g., wheelchair or bedside chair)?: A Little Help needed to walk in hospital room?: A Little Help needed climbing 3-5 steps with a railing? : A Little 6 Click Score: 20    End of Session Equipment Utilized During Treatment: Gait belt Activity Tolerance: Patient tolerated treatment well Patient left: in chair;with call bell/phone within reach;with family/visitor  present Nurse Communication: Mobility status PT Visit Diagnosis: Muscle weakness (generalized) (M62.81);Unsteadiness on feet (R26.81)    Time: 0623-7628 PT Time Calculation (min) (ACUTE ONLY): 21 min   Charges:   PT Evaluation $PT Eval Low Complexity: 1 Low          Verner Mould, DPT Acute Rehabilitation Services Office [phone] Pager [phone]   Jacques Navy 09/26/2021, 5:11 PM

## 2021-09-26 NOTE — Progress Notes (Signed)
Pharmacy Antibiotic Note  Katelyn Powers is a 87 y.o. female admitted on 09/25/2021 with medical history significant of  HLD HTN CAD, dementia, hx of CVA, Carotid stenosis. Prediabetes, admited for partial SBO.  Pharmacy has been consulted to dose unasyn for asp pna  Plan: Unasyn 3gm IV q8h Follow renal function and clinical course  Height: 5\' 2"  (157.5 cm) Weight: 70.3 kg (155 lb) IBW/kg (Calculated) : 50.1  Temp (24hrs), Avg:98.4 F (36.9 C), Min:98.4 F (36.9 C), Max:98.4 F (36.9 C)  Recent Labs  Lab 09/25/21 2130 09/26/21 0120  WBC 17.1*  --   CREATININE 1.04*  --   LATICACIDVEN  --  2.6*    Estimated Creatinine Clearance: 35 mL/min (A) (by C-G formula based on SCr of 1.04 mg/dL (H)).    No Known Allergies   Thank you for allowing pharmacy to be a part of this patient's care. Dolly Rias RPh 09/26/2021, 2:48 AM

## 2021-09-26 NOTE — Consult Note (Signed)
[facility] Surgery Consult Note  Katelyn Powers 06/27/1932  [id number].    Requesting MD: Jeoffrey Massed Chief Complaint/Reason for Consult: SBO  HPI:  Katelyn Powers is an 87yo female PMH HTN, HLD, DM, CAD, carotid stenosis, hx CVA on plavix (last dose unknown as daughter who is with her was not the one she was living with, suspect yesterday though) who was admitted to [facility] yesterday with SBO. Patient presented to the ED with acute onset abdominal pain/distension, nausea, and vomiting. Symptoms started around 1600 yesterday after eating a sandwich, salad, nuts, and mac and cheese. She vomited twice and was unable to tolerate PO. Last BM was yesterday twice.  The daughter denies any known sick contacts.  History obtained from the daughter due to the patient's dementia. In the ED patient underwent CT scan which shows mid partial SBO. She was admitted to the medical service and general surgery asked to see. She had an abdominal film this morning that shows a nonobstructive bowel gas pattern.   Abdominal surgical history: tubal ligation via pfannenstiel incision Former smoker Denies alcohol or illicit drug use Lives at home with daughter and alternates between the two daughters  Review of Systems  Unable to perform ROS: Dementia    Family History  Problem Relation Age of Onset   Colon cancer Sister 66   Colon polyps Brother     Past Medical History:  Diagnosis Date   Cataract    Gout    Hyperlipidemia    Hypertension    Memory loss    NSTEMI (non-ST elevated myocardial infarction) ([place]) 08/2020   Right-sided carotid artery disease ([place]) 09/17/2020   carotid doppler wth >60% occlusion    Past Surgical History:  Procedure Laterality Date   MELANOMA EXCISION Right 1972   level 2   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 She has never used smokeless tobacco. She reports that she does not drink  alcohol and does not use drugs.  Allergies: No Known Allergies  (Not in a hospital admission)   Prior to Admission medications   Medication Sig Start Date End Date Taking? Authorizing Provider  amLODipine (NORVASC) 5 MG tablet Take 1 tablet (5 mg total) by mouth daily. 09/07/20  Yes Sande Rives E, PA-C  aspirin 81 MG tablet Take 81 mg by mouth daily.   Yes [provider]  Cholecalciferol (VITAMIN D3 PO) Take 1 Dose by mouth daily. Vit D3 (4,000U), Vit A, K2+ 4 immune boosters   Yes [provider]  clopidogrel (PLAVIX) 75 MG tablet TAKE 1 TABLET BY MOUTH  DAILY Patient taking differently: Take 75 mg by mouth daily. 07/16/21  Yes Sande Rives E, PA-C  Ginkgo Biloba 120 MG CAPS Take 550 mg by mouth in the morning.   Yes [provider]  losartan (COZAAR) 50 MG tablet Take 1 tablet (50 mg total) by mouth daily. 09/07/20  Yes Darreld Mclean, PA-C  Melatonin 10 MG TABS Take 10 mg by mouth at bedtime. NEEDS NIGHTLY TO SLEEP OR WILL BE RESTLESS   Yes [provider]  Omega-3-acid Eth &Multivit/Min (OMEGA-3 RX COMPLETE PO) Take 1 capsule by mouth at bedtime.   Yes [provider]  ondansetron (ZOFRAN ODT) 4 MG disintegrating tablet Take 1 tablet (4 mg total) by mouth every 8 (eight) hours as needed for nausea or vomiting. 04/09/21  Yes Vanessa Conde, MD  UNABLE  TO FIND Take 2 tablets by mouth every evening. Med Name: Living-good daily: Healthy Cholesterol Support   Yes [provider]  nitroGLYCERIN (NITROSTAT) 0.4 MG SL tablet Place 1 tablet (0.4 mg total) under the tongue every 5 (five) minutes as needed for chest pain. 09/07/20   Sande Rives E, PA-C    Blood pressure (!) 146/93, pulse 80, temperature 98.4 F (36.9 C), temperature source Oral, resp. rate 18, height _0  (1.575 m), weight 70.3 kg, SpO2 96 %. Physical Exam: General: pleasant, WD/WN female who is laying in bed in NAD HEENT: head is normocephalic, atraumatic.   Sclera are noninjected.  Pupils equal and round.  Ears and nose without any masses or lesions.  Mouth is pink and moist. Dentition fair Heart: regular, rate, and rhythm.  Normal s1,s2. No obvious gallops, or rubs noted.  +murmur.  Palpable pedal pulses bilaterally  Lungs: CTAB, no wheezes, rhonchi, or rales noted.  Respiratory effort nonlabored Abd: soft, NT/ND, +BS, no masses, hernias, or organomegaly.  Pfannenstiel incision noted MS: all 4 extremities are symmetrical with no cyanosis, clubbing, or edema Skin: warm and dry with no masses, lesions, or rashes Neuro: Cranial nerves 2-12 grossly intact, sensation is normal throughout Psych: Alert with a an appropriate affect   Results for orders placed or performed during the hospital encounter of 09/25/21 (from the past 48 hour(s))  Lipase, blood     Status: None   Collection Time: 09/25/21  9:30 PM  Result Value Ref Range   Lipase 29 11 - 51 U/L    Comment: Performed at [facility], [place] [address], [place], [place] [address]  Comprehensive metabolic panel     Status: Abnormal   Collection Time: 09/25/21  9:30 PM  Result Value Ref Range   Sodium 137 135 - 145 mmol/L   Potassium 4.1 3.5 - 5.1 mmol/L   Chloride 102 98 - 111 mmol/L   CO2 24 22 - 32 mmol/L   Glucose, Bld 153 (H) 70 - 99 mg/dL    Comment: Glucose reference range applies only to samples taken after fasting for at least 8 hours.   BUN 25 (H) 8 - 23 mg/dL   Creatinine, Ser 1.04 (H) 0.44 - 1.00 mg/dL   Calcium 10.2 8.9 - 10.3 mg/dL   Total Protein 8.4 (H) 6.5 - 8.1 g/dL   Albumin 4.3 3.5 - 5.0 g/dL   AST 18 15 - 41 U/L   ALT 12 0 - 44 U/L   Alkaline Phosphatase 75 38 - 126 U/L   Total Bilirubin 1.1 0.3 - 1.2 mg/dL   GFR, Estimated 52 (L) >60 mL/min    Comment: (NOTE) Calculated using the CKD-EPI Creatinine Equation (2021)    Anion gap 11 5 - 15    Comment: Performed at [facility], [place] [address] [place], [place] [address]  CBC      Status: Abnormal   Collection Time: 09/25/21  9:30 PM  Result Value Ref Range   WBC 17.1 (H) 4.0 - 10.5 K/uL   RBC 4.76 3.87 - 5.11 MIL/uL   Hemoglobin 14.4 12.0 - 15.0 g/dL   HCT 43.5 36.0 - 46.0 %   MCV 91.4 80.0 - 100.0 fL   MCH 30.3 26.0 - 34.0 pg   MCHC 33.1 30.0 - 36.0 g/dL   RDW 13.6 11.5 - 15.5 %   Platelets 222 150 - 400 K/uL   nRBC 0.0 0.0 - 0.2 %    Comment: Performed at  [facility], [place] [address] [place], [place] [address]  Urinalysis, Routine w reflex microscopic     Status: Abnormal   Collection Time: 09/25/21  9:30 PM  Result Value Ref Range   Color, Urine YELLOW YELLOW   APPearance CLEAR CLEAR   Specific Gravity, Urine 1.022 1.005 - 1.030   pH 5.0 5.0 - 8.0   Glucose, UA NEGATIVE NEGATIVE mg/dL   Hgb urine dipstick NEGATIVE NEGATIVE   Bilirubin Urine NEGATIVE NEGATIVE   Ketones, ur 5 (A) NEGATIVE mg/dL   Protein, ur >=300 (A) NEGATIVE mg/dL   Nitrite NEGATIVE NEGATIVE   Leukocytes,Ua NEGATIVE NEGATIVE   RBC / HPF 0-5 0 - 5 RBC/hpf   WBC, UA 0-5 0 - 5 WBC/hpf   Bacteria, UA NONE SEEN NONE SEEN   Squamous Epithelial / LPF 0-5 0 - 5   Mucus PRESENT    Hyaline Casts, UA PRESENT     Comment: Performed at [facility], [place] [address], [place], [place] [address]  CK     Status: None   Collection Time: 09/25/21  9:30 PM  Result Value Ref Range   Total CK 44 38 - 234 U/L    Comment: Performed at [facility], [place] [address] [place], [place] [address]  Differential     Status: Abnormal   Collection Time: 09/25/21  9:30 PM  Result Value Ref Range   Neutrophils Relative % 90 %   Neutro Abs 13.3 (H) 1.7 - 7.7 K/uL   Lymphocytes Relative 6 %   Lymphs Abs 0.8 0.7 - 4.0 K/uL   Monocytes Relative 3 %   Monocytes Absolute 0.5 0.1 - 1.0 K/uL   Eosinophils Relative 0 %   Eosinophils Absolute 0.0 0.0 - 0.5 K/uL   Basophils Relative 0 %   Basophils Absolute 0.1 0.0 - 0.1 K/uL   Immature Granulocytes 1 %    Abs Immature Granulocytes 0.08 (H) 0.00 - 0.07 K/uL    Comment: Performed at [facility], [place] [address] [place], [place] [address]  Magnesium     Status: None   Collection Time: 09/25/21  9:30 PM  Result Value Ref Range   Magnesium 2.0 1.7 - 2.4 mg/dL    Comment: Performed at [facility], [place] [address], [place], [place] [address]  Phosphorus     Status: None   Collection Time: 09/25/21  9:30 PM  Result Value Ref Range   Phosphorus 3.3 2.5 - 4.6 mg/dL    Comment: Performed at [facility], [place] [address], [place],  [address]  Procalcitonin - Baseline     Status: None   Collection Time: 09/25/21  9:30 PM  Result Value Ref Range   Procalcitonin <0.10 ng/mL    Comment:        Interpretation: PCT (Procalcitonin) <= 0.5 ng/mL: Systemic infection (sepsis) is not likely. Local bacterial infection is possible. (NOTE)       Sepsis PCT Algorithm           Lower Respiratory Tract                                      Infection PCT Algorithm    ----------------------------     ----------------------------         PCT < 0.25 ng/mL  PCT < 0.10 ng/mL          Strongly encourage             Strongly discourage   discontinuation of antibiotics    initiation of antibiotics    ----------------------------     -----------------------------       PCT 0.25 - 0.50 ng/mL            PCT 0.10 - 0.25 ng/mL               OR       >80% decrease in PCT            Discourage initiation of                                            antibiotics      Encourage discontinuation           of antibiotics    ----------------------------     -----------------------------         PCT >= 0.50 ng/mL              PCT 0.26 - 0.50 ng/mL               AND        <80% decrease in PCT             Encourage initiation of                                             antibiotics       Encourage continuation           of antibiotics     ----------------------------     -----------------------------        PCT >= 0.50 ng/mL                  PCT > 0.50 ng/mL               AND         increase in PCT                  Strongly encourage                                      initiation of antibiotics    Strongly encourage escalation           of antibiotics                                     -----------------------------                                           PCT <= 0.25 ng/mL                                                 OR                                        >  80% decrease in PCT                                      Discontinue / Do not initiate                                             antibiotics  Performed at [place] [address], [place], [place] [address]   Resp Panel by RT-PCR (Flu A&B, Covid) Nasopharyngeal Swab     Status: None   Collection Time: 09/25/21 11:21 PM   Specimen: Nasopharyngeal Swab; Nasopharyngeal(NP) swabs in vial transport medium  Result Value Ref Range   SARS Coronavirus 2 by RT PCR NEGATIVE NEGATIVE    Comment: (NOTE) SARS-CoV-2 target nucleic acids are NOT DETECTED.  The SARS-CoV-2 RNA is generally detectable in upper respiratory specimens during the acute phase of infection. The lowest concentration of SARS-CoV-2 viral copies this assay can detect is 138 copies/mL. A negative result does not preclude SARS-Cov-2 infection and should not be used as the sole basis for treatment or other patient management decisions. A negative result may occur with  improper specimen collection/handling, submission of specimen other than nasopharyngeal swab, presence of viral mutation(s) within the areas targeted by this assay, and inadequate number of viral copies(<138 copies/mL). A negative result must be combined with clinical observations, patient history, and epidemiological information. The expected result is Negative.  Fact Sheet for Patients:   EntrepreneurPulse.com.au  Fact Sheet for Healthcare Providers:  IncredibleEmployment.be  This test is no t yet approved or cleared by the [place] FDA and  has been authorized for detection and/or diagnosis of SARS-CoV-2 by FDA under an Emergency Use Authorization (EUA). This EUA will remain  in effect (meaning this test can be used) for the duration of the COVID-19 declaration under Section 564(b)(1) of the Act, 21 U.S.C.section 360bbb-3(b)(1), unless the authorization is terminated  or revoked sooner.       Influenza A by PCR NEGATIVE NEGATIVE   Influenza B by PCR NEGATIVE NEGATIVE    Comment: (NOTE) The Xpert Xpress SARS-CoV-2/FLU/RSV plus assay is intended as an aid in the diagnosis of influenza from Nasopharyngeal swab specimens and should not be used as a sole basis for treatment. Nasal washings and aspirates are unacceptable for Xpert Xpress SARS-CoV-2/FLU/RSV testing.  Fact Sheet for Patients: EntrepreneurPulse.com.au  Fact Sheet for Healthcare Providers: IncredibleEmployment.be  This test is not yet approved or cleared by the [place] FDA and has been authorized for detection and/or diagnosis of SARS-CoV-2 by FDA under an Emergency Use Authorization (EUA). This EUA will remain in effect (meaning this test can be used) for the duration of the COVID-19 declaration under Section 564(b)(1) of the Act, 21 U.S.C. section 360bbb-3(b)(1), unless the authorization is terminated or revoked.  Performed at [facility], [place] [address] [place], [place] [address]   CBG monitoring, ED     Status: Abnormal   Collection Time: 09/26/21 12:58 AM  Result Value Ref Range   Glucose-Capillary 149 (H) 70 - 99 mg/dL    Comment: Glucose reference range applies only to samples taken after fasting for at least 8 hours.  Lactic acid, plasma     Status: Abnormal   Collection Time: 09/26/21   1:20 AM  Result Value Ref  Range   Lactic Acid, Venous 2.6 (HH) 0.5 - 1.9 mmol/L    Comment: CRITICAL RESULT CALLED TO, READ BACK BY AND VERIFIED WITH:  Tye Savoy RN 09/26/21 @ 8889 VS Performed at [facility], [place] [address] [place], [place] [address]   Lactic acid, plasma     Status: Abnormal   Collection Time: 09/26/21  3:24 AM  Result Value Ref Range   Lactic Acid, Venous 2.3 (HH) 0.5 - 1.9 mmol/L    Comment: CRITICAL VALUE NOTED.  VALUE IS CONSISTENT WITH PREVIOUSLY REPORTED AND CALLED VALUE. Performed at [facility], [place] [address] [place], [place] [address]   Brain natriuretic peptide     Status: Abnormal   Collection Time: 09/26/21  3:24 AM  Result Value Ref Range   B Natriuretic Peptide 108.6 (H) 0.0 - 100.0 pg/mL    Comment: Performed at [facility], [place] [address] [place], [place] [address]  CBG monitoring, ED     Status: Abnormal   Collection Time: 09/26/21  4:10 AM  Result Value Ref Range   Glucose-Capillary 121 (H) 70 - 99 mg/dL    Comment: Glucose reference range applies only to samples taken after fasting for at least 8 hours.  CBG monitoring, ED     Status: Abnormal   Collection Time: 09/26/21  8:18 AM  Result Value Ref Range   Glucose-Capillary 102 (H) 70 - 99 mg/dL    Comment: Glucose reference range applies only to samples taken after fasting for at least 8 hours.  CBC with Differential/Platelet     Status: Abnormal   Collection Time: 09/26/21  9:05 AM  Result Value Ref Range   WBC 11.5 (H) 4.0 - 10.5 K/uL   RBC 3.89 3.87 - 5.11 MIL/uL   Hemoglobin 11.8 (L) 12.0 - 15.0 g/dL   HCT 36.0 36.0 - 46.0 %   MCV 92.5 80.0 - 100.0 fL   MCH 30.3 26.0 - 34.0 pg   MCHC 32.8 30.0 - 36.0 g/dL   RDW 13.8 11.5 - 15.5 %   Platelets 201 150 - 400 K/uL   nRBC 0.0 0.0 - 0.2 %   Neutrophils Relative % 79 %   Neutro Abs 8.9 (H) 1.7 - 7.7 K/uL   Lymphocytes Relative 11 %   Lymphs Abs 1.3 0.7 - 4.0 K/uL    Monocytes Relative 9 %   Monocytes Absolute 1.1 (H) 0.1 - 1.0 K/uL   Eosinophils Relative 1 %   Eosinophils Absolute 0.1 0.0 - 0.5 K/uL   Basophils Relative 0 %   Basophils Absolute 0.0 0.0 - 0.1 K/uL   Immature Granulocytes 0 %   Abs Immature Granulocytes 0.04 0.00 - 0.07 K/uL    Comment: Performed at [facility], [place] [address], [place], [place] [address]   CT Abdomen Pelvis Wo Contrast  Result Date: 09/25/2021 CLINICAL DATA:  Abdominal distension. EXAM: CT ABDOMEN AND PELVIS WITHOUT CONTRAST TECHNIQUE: Multidetector CT imaging of the abdomen and pelvis was performed following the standard protocol without IV contrast. COMPARISON:  None. FINDINGS: Lower chest: Mild linear atelectasis is seen within the bilateral lung bases. Hepatobiliary: No focal liver abnormality is seen. A 1.6 cm gallstone is seen within the lumen of an otherwise normal-appearing gallbladder. Pancreas: Unremarkable. No pancreatic ductal dilatation or surrounding inflammatory changes. Spleen: Normal in size without focal abnormality. Adrenals/Urinary Tract: The right adrenal gland is normal in appearance. 1.0 cm x 0.8 cm and 1.3 cm x 0.8 cm low-attenuation  left adrenal masses are noted (approximately 3.6 Hounsfield units). Kidneys are normal in size, without renal calculi or hydronephrosis. 2.5 cm diameter and 4.1 cm diameter cysts are seen within the right kidney. Bladder is unremarkable. Stomach/Bowel: There is a small hiatal hernia. Appendix appears normal. Multiple dilated small bowel loops are seen throughout the abdomen (maximum small bowel diameter of approximately 3.2 cm). A gradual transition zone is seen within the mid to lower left abdomen (axial CT images 41 through 55, CT series 2). This is within the mid to distal jejunum. Noninflamed diverticula are seen throughout the large bowel. Vascular/Lymphatic: Aortic atherosclerosis. No enlarged abdominal or pelvic lymph nodes. Reproductive: Uterus and  bilateral adnexa are unremarkable. Other: No abdominal wall hernia or abnormality. No abdominopelvic ascites. Musculoskeletal: Multilevel degenerative changes are seen throughout the lumbar spine. IMPRESSION: 1. Mid partial small bowel obstruction. 2. Cholelithiasis. 3. Colonic diverticulosis. 4. Small low-attenuation left adrenal masses which likely represent adrenal adenomas. 5. Aortic atherosclerosis. Aortic Atherosclerosis (ICD10-I70.0). Electronically Signed   By: Virgina Norfolk M.D.   On: 09/25/2021 23:19   DG Abd 1 View  Result Date: 09/26/2021 CLINICAL DATA:  Small bowel obstruction. Abdominal pain and vomiting EXAM: ABDOMEN - 1 VIEW COMPARISON:  04/09/2021 FINDINGS: Supine views of the abdomen were obtained. Nonobstructive bowel gas pattern. Again noted is levoscoliosis in the lumbar spine. Small amount of gas in the stomach. Stool in the left upper quadrant of the abdomen. IMPRESSION: Nonobstructive bowel gas pattern. Electronically Signed   By: Markus Daft M.D.   On: 09/26/2021 08:59   DG CHEST PORT 1 VIEW  Result Date: 09/25/2021 CLINICAL DATA:  Nausea with leukocytosis. EXAM: PORTABLE CHEST 1 VIEW COMPARISON:  09/04/2020 portable chest FINDINGS: There is mild cardiomegaly and mild central vascular fullness. This could be due to interval low lung volumes or mild perihilar vascular congestion. There are increased interstitial markings throughout the hypoinflated lungs, which could indicate interstitial edema, interstitial pneumonitis or changes of low inspiration. There is patchy calcification of the aorta. No dense focal pulmonary infiltrate is seen. Generalized osteopenia. Right axillary surgical clips are again noted. IMPRESSION: There are increased interstitial markings in the lung fields and mild central vascular fullness, but the lungs are near expiratory in the current exam. This could relate to changes of low inspiration or the patient could have mild perihilar vascular congestion and  interstitial edema or an interstitial pneumonitis. No pleural effusion is seen. A follow-up study is recommended in full inspiration. Electronically Signed   By: Telford Nab M.D.   On: 09/25/2021 23:51    Anti-infectives (From admission, onward)    Start     Dose/Rate Route Frequency Ordered Stop   09/26/21 0245  Ampicillin-Sulbactam (UNASYN) 3 g in sodium chloride 0.9 % 100 mL IVPB        3 g 200 mL/hr over 30 Minutes Intravenous Every 8 hours 09/26/21 0244          Assessment/Plan SBO - Patient was admitted last night with SBO. She has only had a prior tubal ligation in the past. She seems to be improving this morning as her abdominal film shows a normal bowel gas pattern. Unsure if any return in bowel function given her dementia.  No BM since arrival but unclear on flatus. Her nausea, vomiting, and abdominal pain have resolved. Given she has no dilated SB and air throughout her colon, we will go ahead and give a po challenge.  If she tolerates liquids, her diet can be ADAT and  DC home even possibly later today if she is having some bowel function vs tomorrow.  If she develops worsening abdominal pain, N/V, then may need an NGT with a SBO protocol.  Discussed with patient's daughter at bedside as well as with Dr. Karleen Hampshire.  ID - unasyn 11/30>> VTE - SCDs, ok for chemical dvt prophylaxis FEN - IVF, CLD, ADAT Foley - none  HTN HLD DM CAD Carotid stenosis Hx CVA on plavix   Henreitta Cea, [facility] Surgery 09/26/2021, 9:29 AM Please see Amion for pager number during day hours 7:00am-4:30pm

## 2021-09-26 NOTE — Progress Notes (Signed)
Ambulated 200 ft in hallway on room air while wearing a droplet mask. Patient did not appear to be short of breath. O2 sat maintained in the 90's closer to 94% during ambulation with HR in 90's. Assisted back to bed, will continue to monitor

## 2021-09-27 LAB — CBC
HCT: 36.7 % (ref 36.0–46.0)
Hemoglobin: 12.1 g/dL (ref 12.0–15.0)
MCH: 30.3 pg (ref 26.0–34.0)
MCHC: 33 g/dL (ref 30.0–36.0)
MCV: 91.8 fL (ref 80.0–100.0)
Platelets: 181 10*3/uL (ref 150–400)
RBC: 4 MIL/uL (ref 3.87–5.11)
RDW: 13.9 % (ref 11.5–15.5)
WBC: 7.8 10*3/uL (ref 4.0–10.5)
nRBC: 0 % (ref 0.0–0.2)

## 2021-09-27 LAB — BASIC METABOLIC PANEL
Anion gap: 5 (ref 5–15)
BUN: 16 mg/dL (ref 8–23)
CO2: 26 mmol/L (ref 22–32)
Calcium: 9.2 mg/dL (ref 8.9–10.3)
Chloride: 108 mmol/L (ref 98–111)
Creatinine, Ser: 0.98 mg/dL (ref 0.44–1.00)
GFR, Estimated: 56 mL/min — ABNORMAL LOW (ref >60)
Glucose, Bld: 114 mg/dL — ABNORMAL HIGH (ref 70–99)
Potassium: 3.8 mmol/L (ref 3.5–5.1)
Sodium: 139 mmol/L (ref 135–145)

## 2021-09-27 LAB — TSH: TSH: 3.098 u[IU]/mL (ref 0.350–4.500)

## 2021-09-27 LAB — LACTIC ACID, PLASMA: Lactic Acid, Venous: 1.5 mmol/L (ref 0.5–1.9)

## 2021-09-27 LAB — HEMOGLOBIN A1C
Hgb A1c MFr Bld: 6.1 % — ABNORMAL HIGH (ref 4.8–5.6)
Mean Plasma Glucose: 128 mg/dL

## 2021-09-27 LAB — GLUCOSE, CAPILLARY
Glucose-Capillary: 105 mg/dL — ABNORMAL HIGH (ref 70–99)
Glucose-Capillary: 107 mg/dL — ABNORMAL HIGH (ref 70–99)
Glucose-Capillary: 141 mg/dL — ABNORMAL HIGH (ref 70–99)

## 2021-09-27 LAB — PROCALCITONIN: Procalcitonin: <0.1 ng/mL

## 2021-09-27 MED ORDER — GLYCERIN (LAXATIVE) 2 G RE SUPP
1.0000 | Freq: Once | RECTAL | Status: AC
  Administered 2021-09-27: 1 via RECTAL
  Filled 2021-09-27: qty 1

## 2021-09-27 MED ORDER — AMOXICILLIN-POT CLAVULANATE 875-125 MG PO TABS: 1.0000 | ORAL_TABLET | Freq: Two times a day (BID) | ORAL | 0 refills | Status: AC

## 2021-09-27 MED ORDER — POLYETHYLENE GLYCOL 3350 17 G PO PACK
17.0000 g | PACK | Freq: Every day | ORAL | Status: DC
  Administered 2021-09-27: 17 g via ORAL
  Filled 2021-09-27: qty 1

## 2021-09-27 NOTE — Plan of Care (Signed)

## 2021-09-27 NOTE — Progress Notes (Signed)
Patient ready for DC. Tele and iv removed. DC instructions reviewed with daughter Coralyn Mark. Reviewed new medications and home medications to resume. Provided diabetic resources per request. Martin Majestic over follow up appointments and care. All questions answered. Pt changed and collected all belongings. Pt dc via wheelchair.

## 2021-09-27 NOTE — Evaluation (Signed)
Occupational Therapy Evaluation Patient Details Name: Katelyn Powers MRN: [id number] DOB: 1932/08/02 Today's Date: 09/27/2021   History of Present Illness Patient is 87 y.o. female presented to hospital with nausea, vomiting, abdominal pain, and diarrhea. CT revelaed partial SBO. PMH significant for HTN, HLD, dementia, CVA, CAD, carotid stenosis.   Clinical Impression   Patient admitted for the diagnosis above.  PTA she lives with her daughter's and has supervision for ADL and mobility, and assist as needed with IADL, community mobility, medications and bill payment due to baseline cognitive impairment.  Patient is essentially at her baseline, daughter endorses, and no acute OT needs exist.  Recommend home with family when cleared medically.      Recommendations for follow up therapy are one component of a multi-disciplinary discharge planning process, led by the attending physician.  Recommendations may be updated based on patient status, additional functional criteria and insurance authorization.   Follow Up Recommendations  No OT follow up    Assistance Recommended at Discharge    Functional Status Assessment  Patient has not had a recent decline in their functional status  Equipment Recommendations  None recommended by OT    Recommendations for Other Services       Precautions / Restrictions Precautions Precautions: Fall Restrictions Weight Bearing Restrictions: No      Mobility Bed Mobility               General bed mobility comments: up in recliner    Transfers Overall transfer level: Needs assistance   Transfers: Sit to/from Stand Sit to Stand: Supervision                  Balance Overall balance assessment: Mild deficits observed, not formally tested                                         ADL either performed or assessed with clinical judgement   ADL Overall ADL's : At baseline                                        General ADL Comments: supervision for ADL/IADL via family     Vision Patient Visual Report: No change from baseline       Perception     Praxis      Pertinent Vitals/Pain Pain Assessment: No/denies pain     Hand Dominance Right   Extremity/Trunk Assessment Upper Extremity Assessment Upper Extremity Assessment: Overall WFL for tasks assessed   Lower Extremity Assessment Lower Extremity Assessment: Defer to PT evaluation       Communication Communication Communication: No difficulties   Cognition Arousal/Alertness: Awake/alert   Overall Cognitive Status: History of cognitive impairments - at baseline                                 General Comments: baseline dementia     General Comments       Exercises     Shoulder Instructions      Home Living Family/patient expects to be discharged to:: Private residence Living Arrangements: Children Available Help at Discharge: Family;Available 24 hours/day Type of Home: House Home Access: Stairs to enter   Entrance Stairs-Rails: Right;Left Home Layout: One level  Bathroom Shower/Tub: Teacher, early years/pre: Standard Bathroom Accessibility: Yes   Home Equipment: Conservation officer, nature (2 wheels);Cane - single point;Shower seat          Prior Functioning/Environment Prior Level of Function : Independent/Modified Independent                        OT Problem List: Impaired balance (sitting and/or standing)      OT Treatment/Interventions:      OT Goals(Current goals can be found in the care plan section) Acute Rehab OT Goals Patient Stated Goal: Per daughter, return home OT Goal Formulation: With patient Time For Goal Achievement: 09/28/21 Potential to Achieve Goals: Good  OT Frequency:     Barriers to D/C:            Co-evaluation              AM-PAC OT "6 Clicks" Daily Activity     Outcome Measure Help from another person eating meals?: None Help  from another person taking care of personal grooming?: None Help from another person toileting, which includes using toliet, bedpan, or urinal?: A Little Help from another person bathing (including washing, rinsing, drying)?: A Little Help from another person to put on and taking off regular upper body clothing?: A Little Help from another person to put on and taking off regular lower body clothing?: A Little 6 Click Score: 20   End of Session    Activity Tolerance: Patient tolerated treatment well Patient left: in chair;with call bell/phone within reach;with family/visitor present  OT Visit Diagnosis: Unsteadiness on feet (R26.81)                Time: 1021-1040 OT Time Calculation (min): 19 min Charges:  OT General Charges $OT Visit: 1 Visit OT Evaluation $OT Eval Moderate Complexity: 1 Mod  09/27/2021  RP, OTR/L  Acute Rehabilitation Services  Office:  [phone]   Metta Clines 09/27/2021, 1:23 PM

## 2021-09-27 NOTE — Progress Notes (Signed)
Subjective: No new complaints this morning.  Tolerating solid diet with no issues.  Daughter states she thinks she has heard her pass some flatus.  No BM yet.    ROS: See above, otherwise other systems negative  Objective: Vital signs in last 24 hours: Temp:  [97.8 F (36.6 C)-98.5 F (36.9 C)] 98.5 F (36.9 C) (12/01 0437) Pulse Rate:  [70-86] 70 (12/01 0437) Resp:  [16-18] 16 (12/01 0437) BP: (139-161)/(54-86) 139/67 (12/01 0437) SpO2:  [94 %-97 %] 94 % (12/01 0437)    Intake/Output from previous day: 11/30 0701 - 12/01 0700 In: 1533.1 [P.O.:360; I.V.:969.5; IV Piggyback:203.6] Out: -  Intake/Output this shift: No intake/output data recorded.  PE: Abd: soft, NT, ND, +BS  Lab Results:  Recent Labs    09/26/21 0905 09/27/21 0435  WBC 11.5* 7.8  HGB 11.8* 12.1  HCT 36.0 36.7  PLT 201 181   BMET Recent Labs    09/26/21 0905 09/27/21 0435  NA 138 139  K 4.0 3.8  CL 107 108  CO2 26 26  GLUCOSE 115* 114*  BUN 20 16  CREATININE 1.03* 0.98  CALCIUM 9.0 9.2   PT/INR No results for input(s): LABPROT, INR in the last 72 hours. CMP     Component Value Date/Time   NA 139 09/27/2021 0435   K 3.8 09/27/2021 0435   CL 108 09/27/2021 0435   CO2 26 09/27/2021 0435   GLUCOSE 114 (H) 09/27/2021 0435   BUN 16 09/27/2021 0435   CREATININE 0.98 09/27/2021 0435   CALCIUM 9.2 09/27/2021 0435   PROT 6.6 09/26/2021 0905   ALBUMIN 3.4 (L) 09/26/2021 0905   AST 18 09/26/2021 0905   ALT 11 09/26/2021 0905   ALKPHOS 61 09/26/2021 0905   BILITOT 0.7 09/26/2021 0905   GFRNONAA 56 (L) 09/27/2021 0435   GFRAA 56 (L) 05/10/2020 0150   Lipase     Component Value Date/Time   LIPASE 29 09/25/2021 2130       Studies/Results: CT Abdomen Pelvis Wo Contrast  Result Date: 09/25/2021 CLINICAL DATA:  Abdominal distension. EXAM: CT ABDOMEN AND PELVIS WITHOUT CONTRAST TECHNIQUE: Multidetector CT imaging of the abdomen and pelvis was performed following the standard  protocol without IV contrast. COMPARISON:  None. FINDINGS: Lower chest: Mild linear atelectasis is seen within the bilateral lung bases. Hepatobiliary: No focal liver abnormality is seen. A 1.6 cm gallstone is seen within the lumen of an otherwise normal-appearing gallbladder. Pancreas: Unremarkable. No pancreatic ductal dilatation or surrounding inflammatory changes. Spleen: Normal in size without focal abnormality. Adrenals/Urinary Tract: The right adrenal gland is normal in appearance. 1.0 cm x 0.8 cm and 1.3 cm x 0.8 cm low-attenuation left adrenal masses are noted (approximately 3.6 Hounsfield units). Kidneys are normal in size, without renal calculi or hydronephrosis. 2.5 cm diameter and 4.1 cm diameter cysts are seen within the right kidney. Bladder is unremarkable. Stomach/Bowel: There is a small hiatal hernia. Appendix appears normal. Multiple dilated small bowel loops are seen throughout the abdomen (maximum small bowel diameter of approximately 3.2 cm). A gradual transition zone is seen within the mid to lower left abdomen (axial CT images 41 through 55, CT series 2). This is within the mid to distal jejunum. Noninflamed diverticula are seen throughout the large bowel. Vascular/Lymphatic: Aortic atherosclerosis. No enlarged abdominal or pelvic lymph nodes. Reproductive: Uterus and bilateral adnexa are unremarkable. Other: No abdominal wall hernia or abnormality. No abdominopelvic ascites. Musculoskeletal: Multilevel degenerative changes are seen throughout the  lumbar spine. IMPRESSION: 1. Mid partial small bowel obstruction. 2. Cholelithiasis. 3. Colonic diverticulosis. 4. Small low-attenuation left adrenal masses which likely represent adrenal adenomas. 5. Aortic atherosclerosis. Aortic Atherosclerosis (ICD10-I70.0). Electronically Signed   By: Virgina Norfolk M.D.   On: 09/25/2021 23:19   DG Abd 1 View  Result Date: 09/26/2021 CLINICAL DATA:  Small bowel obstruction. Abdominal pain and vomiting  EXAM: ABDOMEN - 1 VIEW COMPARISON:  04/09/2021 FINDINGS: Supine views of the abdomen were obtained. Nonobstructive bowel gas pattern. Again noted is levoscoliosis in the lumbar spine. Small amount of gas in the stomach. Stool in the left upper quadrant of the abdomen. IMPRESSION: Nonobstructive bowel gas pattern. Electronically Signed   By: Markus Daft M.D.   On: 09/26/2021 08:59   DG CHEST PORT 1 VIEW  Result Date: 09/25/2021 CLINICAL DATA:  Nausea with leukocytosis. EXAM: PORTABLE CHEST 1 VIEW COMPARISON:  09/04/2020 portable chest FINDINGS: There is mild cardiomegaly and mild central vascular fullness. This could be due to interval low lung volumes or mild perihilar vascular congestion. There are increased interstitial markings throughout the hypoinflated lungs, which could indicate interstitial edema, interstitial pneumonitis or changes of low inspiration. There is patchy calcification of the aorta. No dense focal pulmonary infiltrate is seen. Generalized osteopenia. Right axillary surgical clips are again noted. IMPRESSION: There are increased interstitial markings in the lung fields and mild central vascular fullness, but the lungs are near expiratory in the current exam. This could relate to changes of low inspiration or the patient could have mild perihilar vascular congestion and interstitial edema or an interstitial pneumonitis. No pleural effusion is seen. A follow-up study is recommended in full inspiration. Electronically Signed   By: Telford Nab M.D.   On: 09/25/2021 23:51   ECHOCARDIOGRAM COMPLETE  Result Date: 09/26/2021    ECHOCARDIOGRAM REPORT   Patient Name:   Katelyn Powers Date of Exam: 09/26/2021 Medical Rec #:  [id number]   Height:       62.0 in Accession #:    [id number]  Weight:       155.0 lb Date of Birth:  Feb 09, 1932  BSA:          1.715 m Patient Age:    87 years    BP:           161/82 mmHg Patient Gender: F           HR:           75 bpm. Exam Location:  Inpatient Procedure:  2D Echo, 3D Echo, Cardiac Doppler and Color Doppler Indications:    Murmur R01.1  History:        Patient has prior history of Echocardiogram examinations, most                 recent 09/06/2020. CHF, CAD and Previous Myocardial Infarction,                 Carotid Disease; Risk Factors:Hypertension and Dyslipidemia.  Sonographer:    Darlina Sicilian RDCS Referring Phys: [phone] ANASTASSIA DOUTOVA  Sonographer Comments: Global longitudinal strain was attempted. IMPRESSIONS  1. Left ventricular ejection fraction, by estimation, is 55 to 60%. The left ventricle has normal function. The left ventricle has no regional wall motion abnormalities. Impaired relaxation with elevated left atrial pressure.  2. Right ventricular systolic function is normal. The right ventricular size is normal.  3. The mitral valve is normal in structure. Mild to moderate mitral valve regurgitation. No evidence of mitral stenosis.  4. The aortic valve is calcified. Aortic valve regurgitation is mild. Paradoxical moderate low flow low gradient aortic stenosis. AVA by VTI 0.87 cm2, Aortic valve mean gradient measures 11.0 mmHg. Aortic valve Vmax measures 2.14 m/s, DI 0.34, SVI 26.  5. The inferior vena cava is normal in size with greater than 50% respiratory variability, suggesting right atrial pressure of 3 mmHg. FINDINGS  Left Ventricle: Left ventricular ejection fraction, by estimation, is 55 to 60%. The left ventricle has normal function. The left ventricle has no regional wall motion abnormalities. The left ventricular internal cavity size was normal in size. There is  no left ventricular hypertrophy. Impaired relaxation with elevated left atrial pressure. Right Ventricle: The right ventricular size is normal. No increase in right ventricular wall thickness. Right ventricular systolic function is normal. Left Atrium: Left atrial size was normal in size. Right Atrium: Right atrial size was normal in size. Pericardium: There is no evidence of  pericardial effusion. Mitral Valve: The mitral valve is normal in structure. Mild to moderate mitral valve regurgitation. No evidence of mitral valve stenosis. Tricuspid Valve: The tricuspid valve is normal in structure. Tricuspid valve regurgitation is not demonstrated. No evidence of tricuspid stenosis. Aortic Valve: The aortic valve is calcified. Aortic valve regurgitation is mild. Paradoxical moderate low flow low gradient aortic stenosis. Aortic valve mean gradient measures 11.0 mmHg. Aortic valve peak gradient measures 18.4 mmHg. Aortic valve area, by VTI measures 0.87 cm. Pulmonic Valve: The pulmonic valve was normal in structure. Pulmonic valve regurgitation is not visualized. No evidence of pulmonic stenosis. Aorta: The aortic root is normal in size and structure. Venous: The inferior vena cava is normal in size with greater than 50% respiratory variability, suggesting right atrial pressure of 3 mmHg. IAS/Shunts: No atrial level shunt detected by color flow Doppler.  LEFT VENTRICLE PLAX 2D LVIDd:         3.95 cm   Diastology LVIDs:         2.80 cm   LV e' medial:    3.41 cm/s LV PW:         0.90 cm   LV E/e' medial:  31.7 LV IVS:        1.00 cm   LV e' lateral:   4.22 cm/s LVOT diam:     1.80 cm   LV E/e' lateral: 25.6 LV SV:         45 LV SV Index:   26 LVOT Area:     2.54 cm  RIGHT VENTRICLE RV S prime:     11.60 cm/s TAPSE (M-mode): 1.8 cm LEFT ATRIUM           Index        RIGHT ATRIUM          Index LA diam:      3.40 cm 1.98 cm/m   RA Area:     9.95 cm LA Vol (A4C): 34.9 ml 20.34 ml/m  RA Volume:   20.70 ml 12.07 ml/m  AORTIC VALVE AV Area (Vmax):    1.06 cm AV Area (Vmean):   0.88 cm AV Area (VTI):     0.87 cm AV Vmax:           214.50 cm/s AV Vmean:          154.500 cm/s AV VTI:            0.514 m AV Peak Grad:      18.4 mmHg AV Mean Grad:  11.0 mmHg LVOT Vmax:         89.40 cm/s LVOT Vmean:        53.300 cm/s LVOT VTI:          0.175 m LVOT/AV VTI ratio: 0.34  AORTA Ao Root diam: 3.20  cm Ao Asc diam:  3.20 cm MITRAL VALVE MV Area (PHT): 4.77 cm     SHUNTS MV Decel Time: 159 msec     Systemic VTI:  0.18 m MV E velocity: 108.00 cm/s  Systemic Diam: 1.80 cm MV A velocity: 124.00 cm/s MV E/A ratio:  0.87 Kardie Tobb DO Electronically signed by Berniece Salines DO Signature Date/Time: 09/26/2021/12:49:20 PM    Final     Anti-infectives: Anti-infectives (From admission, onward)    Start     Dose/Rate Route Frequency Ordered Stop   09/26/21 0245  Ampicillin-Sulbactam (UNASYN) 3 g in sodium chloride 0.9 % 100 mL IVPB        3 g 200 mL/hr over 30 Minutes Intravenous Every 8 hours 09/26/21 0244          Assessment/Plan SBO - tolerating solid diet.  Passing flatus.  No BM currently -patient is surgically stable for DC from our standpoint given she is having some bowel function and tolerating solid diet with no issues   ID - unasyn 11/30>> for aspiration PNA VTE - SCDs, ok for chemical dvt prophylaxis FEN - D3 Foley - none   HTN HLD DM CAD Carotid stenosis Hx CVA on plavix  Aspiration PNA   LOS: 2 days    Henreitta Cea , [facility] 09/27/2021, 9:59 AM Please see Amion for pager number during day hours 7:00am-4:30pm or 7:00am -11:30am on weekends

## 2021-09-27 NOTE — TOC Transition Note (Addendum)
Transition of Care [facility]) - CM/SW Discharge Note   Patient Details  Name: Katelyn Powers MRN: [id number] Date of Birth: [birth year]  Transition of [facility] CM/SW Contact:  Dessa Phi, RN Phone Number: 09/27/2021, 12:14 PM   Clinical Narrative:  [facility] HHPT rep Levada Dy aware. Address patient [address], [place] [address].No further CM needs.     Final next level of care: [place] Barriers to Discharge: No Barriers Identified   Patient Goals and CMS Choice Patient states their goals for this hospitalization and ongoing recovery are:: go home CMS Medicare.gov Compare Post Acute Care list provided to:: Patient Represenative (must comment) Choice offered to / list presented to : Adult Children  Discharge Placement                       Discharge Plan and Services   Discharge Planning Services: CM Consult Post Acute Care Choice: Home Health                    HH Arranged: PT [facility] Agency: [facility] Date [facility] Agency Contacted: 09/27/21 Time [place]: [id number] Representative spoke with at [place]: [place] ([place]) Interventions     Readmission Risk Interventions No flowsheet data found.

## 2021-09-28 NOTE — Discharge Summary (Addendum)
Physician Discharge Summary  Katelyn Powers BOF:[id number] DOB: [birth year] DOA: 09/25/2021  PCP: Philmore Pali, NP  Admit date: 09/25/2021 Discharge date: 09/27/2021  Admitted From: Home.  Disposition: Home.   Recommendations for Outpatient Follow-up:  Follow up with PCP in 1-2 weeks Please obtain BMP/CBC in one week Please follow up with general surgery as needed.   Discharge Condition:stable.  CODE STATUS:Full code.  Diet recommendation: Heart Healthy  Brief/Interim Summary: 87 year old lady with prior history of hypertension, hyperlipidemia, dementia, carotid stenosis, history of CVA, dementia, coronary artery disease presents with nausea vomiting and abdominal pain, distention and diarrhea.  CT of the abdomen showed partial SBO.  Discharge Diagnoses:  Principal Problem:   SBO (small bowel obstruction) (HCC) Active Problems:   Hyperlipidemia   Late onset Alzheimer's disease without behavioral disturbance (HCC)  SBO, Unclear etiology, repeat x-rays this morning showed nonobstructive gas pattern. General surgery consulted and appreciate recommendations.  Patient was started on clear liquid diet and advance as tolerated.  Recommend and encourage getting out of bed and walking.   Pt passing flatus and able to tolerate soft diet without any issues.        Dementia No behavioral abnormalities       Abnormal chest x-ray Suspect aspiration pneumonia:  Started on  IV unasyn transitioned to oral augmentin.        Hyperlipidemia:  Resume statin.      H/o CVA;        Diabetes mellitus:  Diet controlled.  Non insulin dependent.      Hypertension:  Well controlled on norvasc.       Discharge Instructions  Discharge Instructions     Diet - low sodium heart healthy   Complete by: As directed    Discharge instructions   Complete by: As directed    Please follow up with PCP in one week.   Increase activity slowly   Complete by: As directed       Allergies as  of 09/27/2021   No Known Allergies      Medication List     STOP taking these medications    UNABLE TO FIND       TAKE these medications    amLODipine 5 MG tablet Commonly known as: NORVASC Take 1 tablet (5 mg total) by mouth daily.   amoxicillin-clavulanate 875-125 MG tablet Commonly known as: Augmentin Take 1 tablet by mouth every 12 (twelve) hours for 5 days.   aspirin 81 MG tablet Take 81 mg by mouth daily.   clopidogrel 75 MG tablet Commonly known as: PLAVIX TAKE 1 TABLET BY MOUTH  DAILY   Ginkgo Biloba 120 MG Caps Take 550 mg by mouth in the morning.   losartan 50 MG tablet Commonly known as: COZAAR Take 1 tablet (50 mg total) by mouth daily.   Melatonin 10 MG Tabs Take 10 mg by mouth at bedtime. NEEDS NIGHTLY TO SLEEP OR WILL BE RESTLESS   nitroGLYCERIN 0.4 MG SL tablet Commonly known as: NITROSTAT Place 1 tablet (0.4 mg total) under the tongue every 5 (five) minutes as needed for chest pain.   OMEGA-3 RX COMPLETE PO Take 1 capsule by mouth at bedtime.   ondansetron 4 MG disintegrating tablet Commonly known as: Zofran ODT Take 1 tablet (4 mg total) by mouth every 8 (eight) hours as needed for nausea or vomiting.   VITAMIN D3 PO Take 1 Dose by mouth daily. Vit D3 (4,000U), Vit A, K2+ 4 immune boosters  [place] [place] [facility] Follow up.   Specialty: [place] Why: [facility] physical therapy Contact information: [place] [address] [phone]         Philmore Pali, NP. Schedule an appointment as soon as possible for a visit in 1 week(s).   Specialty: Nurse Practitioner Contact information: [place] [address] [phone]         Belva Crome, MD .   Specialty: Cardiology Contact information: [phone] [place] [address] [phone]                No Known Allergies  Consultations: General  surgery.    Procedures/Studies: CT Abdomen Pelvis Wo Contrast  Result Date: 09/25/2021 CLINICAL DATA:  Abdominal distension. EXAM: CT ABDOMEN AND PELVIS WITHOUT CONTRAST TECHNIQUE: Multidetector CT imaging of the abdomen and pelvis was performed following the standard protocol without IV contrast. COMPARISON:  None. FINDINGS: Lower chest: Mild linear atelectasis is seen within the bilateral lung bases. Hepatobiliary: No focal liver abnormality is seen. A 1.6 cm gallstone is seen within the lumen of an otherwise normal-appearing gallbladder. Pancreas: Unremarkable. No pancreatic ductal dilatation or surrounding inflammatory changes. Spleen: Normal in size without focal abnormality. Adrenals/Urinary Tract: The right adrenal gland is normal in appearance. 1.0 cm x 0.8 cm and 1.3 cm x 0.8 cm low-attenuation left adrenal masses are noted (approximately 3.6 Hounsfield units). Kidneys are normal in size, without renal calculi or hydronephrosis. 2.5 cm diameter and 4.1 cm diameter cysts are seen within the right kidney. Bladder is unremarkable. Stomach/Bowel: There is a small hiatal hernia. Appendix appears normal. Multiple dilated small bowel loops are seen throughout the abdomen (maximum small bowel diameter of approximately 3.2 cm). A gradual transition zone is seen within the mid to lower left abdomen (axial CT images 41 through 55, CT series 2). This is within the mid to distal jejunum. Noninflamed diverticula are seen throughout the large bowel. Vascular/Lymphatic: Aortic atherosclerosis. No enlarged abdominal or pelvic lymph nodes. Reproductive: Uterus and bilateral adnexa are unremarkable. Other: No abdominal wall hernia or abnormality. No abdominopelvic ascites. Musculoskeletal: Multilevel degenerative changes are seen throughout the lumbar spine. IMPRESSION: 1. Mid partial small bowel obstruction. 2. Cholelithiasis. 3. Colonic diverticulosis. 4. Small low-attenuation left adrenal masses which likely  represent adrenal adenomas. 5. Aortic atherosclerosis. Aortic Atherosclerosis (ICD10-I70.0). Electronically Signed   By: Virgina Norfolk M.D.   On: 09/25/2021 23:19   DG Abd 1 View  Result Date: 09/26/2021 CLINICAL DATA:  Small bowel obstruction. Abdominal pain and vomiting EXAM: ABDOMEN - 1 VIEW COMPARISON:  04/09/2021 FINDINGS: Supine views of the abdomen were obtained. Nonobstructive bowel gas pattern. Again noted is levoscoliosis in the lumbar spine. Small amount of gas in the stomach. Stool in the left upper quadrant of the abdomen. IMPRESSION: Nonobstructive bowel gas pattern. Electronically Signed   By: Markus Daft M.D.   On: 09/26/2021 08:59   DG CHEST PORT 1 VIEW  Result Date: 09/25/2021 CLINICAL DATA:  Nausea with leukocytosis. EXAM: PORTABLE CHEST 1 VIEW COMPARISON:  09/04/2020 portable chest FINDINGS: There is mild cardiomegaly and mild central vascular fullness. This could be due to interval low lung volumes or mild perihilar vascular congestion. There are increased interstitial markings throughout the hypoinflated lungs, which could indicate interstitial edema, interstitial pneumonitis or changes of low inspiration. There is patchy calcification of the aorta. No dense focal pulmonary infiltrate is seen. Generalized  osteopenia. Right axillary surgical clips are again noted. IMPRESSION: There are increased interstitial markings in the lung fields and mild central vascular fullness, but the lungs are near expiratory in the current exam. This could relate to changes of low inspiration or the patient could have mild perihilar vascular congestion and interstitial edema or an interstitial pneumonitis. No pleural effusion is seen. A follow-up study is recommended in full inspiration. Electronically Signed   By: Telford Nab M.D.   On: 09/25/2021 23:51   ECHOCARDIOGRAM COMPLETE  Result Date: 09/26/2021    ECHOCARDIOGRAM REPORT   Patient Name:   Katelyn Powers Date of Exam: 09/26/2021 Medical Rec  #:  [id number]   Height:       62.0 in Accession #:    [id number]  Weight:       155.0 lb Date of Birth:  12-12-1931  BSA:          1.715 m Patient Age:    36 years    BP:           161/82 mmHg Patient Gender: F           HR:           75 bpm. Exam Location:  Inpatient Procedure: 2D Echo, 3D Echo, Cardiac Doppler and Color Doppler Indications:    Murmur R01.1  History:        Patient has prior history of Echocardiogram examinations, most                 recent 09/06/2020. CHF, CAD and Previous Myocardial Infarction,                 Carotid Disease; Risk Factors:Hypertension and Dyslipidemia.  Sonographer:    Darlina Sicilian RDCS Referring Phys: [phone] ANASTASSIA DOUTOVA  Sonographer Comments: Global longitudinal strain was attempted. IMPRESSIONS  1. Left ventricular ejection fraction, by estimation, is 55 to 60%. The left ventricle has normal function. The left ventricle has no regional wall motion abnormalities. Impaired relaxation with elevated left atrial pressure.  2. Right ventricular systolic function is normal. The right ventricular size is normal.  3. The mitral valve is normal in structure. Mild to moderate mitral valve regurgitation. No evidence of mitral stenosis.  4. The aortic valve is calcified. Aortic valve regurgitation is mild. Paradoxical moderate low flow low gradient aortic stenosis. AVA by VTI 0.87 cm2, Aortic valve mean gradient measures 11.0 mmHg. Aortic valve Vmax measures 2.14 m/s, DI 0.34, SVI 26.  5. The inferior vena cava is normal in size with greater than 50% respiratory variability, suggesting right atrial pressure of 3 mmHg. FINDINGS  Left Ventricle: Left ventricular ejection fraction, by estimation, is 55 to 60%. The left ventricle has normal function. The left ventricle has no regional wall motion abnormalities. The left ventricular internal cavity size was normal in size. There is  no left ventricular hypertrophy. Impaired relaxation with elevated left atrial pressure. Right  Ventricle: The right ventricular size is normal. No increase in right ventricular wall thickness. Right ventricular systolic function is normal. Left Atrium: Left atrial size was normal in size. Right Atrium: Right atrial size was normal in size. Pericardium: There is no evidence of pericardial effusion. Mitral Valve: The mitral valve is normal in structure. Mild to moderate mitral valve regurgitation. No evidence of mitral valve stenosis. Tricuspid Valve: The tricuspid valve is normal in structure. Tricuspid valve regurgitation is not demonstrated. No evidence of tricuspid stenosis. Aortic Valve: The aortic valve is calcified. Aortic valve regurgitation  is mild. Paradoxical moderate low flow low gradient aortic stenosis. Aortic valve mean gradient measures 11.0 mmHg. Aortic valve peak gradient measures 18.4 mmHg. Aortic valve area, by VTI measures 0.87 cm. Pulmonic Valve: The pulmonic valve was normal in structure. Pulmonic valve regurgitation is not visualized. No evidence of pulmonic stenosis. Aorta: The aortic root is normal in size and structure. Venous: The inferior vena cava is normal in size with greater than 50% respiratory variability, suggesting right atrial pressure of 3 mmHg. IAS/Shunts: No atrial level shunt detected by color flow Doppler.  LEFT VENTRICLE PLAX 2D LVIDd:         3.95 cm   Diastology LVIDs:         2.80 cm   LV e' medial:    3.41 cm/s LV PW:         0.90 cm   LV E/e' medial:  31.7 LV IVS:        1.00 cm   LV e' lateral:   4.22 cm/s LVOT diam:     1.80 cm   LV E/e' lateral: 25.6 LV SV:         45 LV SV Index:   26 LVOT Area:     2.54 cm  RIGHT VENTRICLE RV S prime:     11.60 cm/s TAPSE (M-mode): 1.8 cm LEFT ATRIUM           Index        RIGHT ATRIUM          Index LA diam:      3.40 cm 1.98 cm/m   RA Area:     9.95 cm LA Vol (A4C): 34.9 ml 20.34 ml/m  RA Volume:   20.70 ml 12.07 ml/m  AORTIC VALVE AV Area (Vmax):    1.06 cm AV Area (Vmean):   0.88 cm AV Area (VTI):     0.87 cm AV  Vmax:           214.50 cm/s AV Vmean:          154.500 cm/s AV VTI:            0.514 m AV Peak Grad:      18.4 mmHg AV Mean Grad:      11.0 mmHg LVOT Vmax:         89.40 cm/s LVOT Vmean:        53.300 cm/s LVOT VTI:          0.175 m LVOT/AV VTI ratio: 0.34  AORTA Ao Root diam: 3.20 cm Ao Asc diam:  3.20 cm MITRAL VALVE MV Area (PHT): 4.77 cm     SHUNTS MV Decel Time: 159 msec     Systemic VTI:  0.18 m MV E velocity: 108.00 cm/s  Systemic Diam: 1.80 cm MV A velocity: 124.00 cm/s MV E/A ratio:  0.87 Kardie Tobb DO Electronically signed by Berniece Salines DO Signature Date/Time: 09/26/2021/12:49:20 PM    Final       Subjective: No chest pain or sob.  No nausea or vomiting. No abd pain.  Passing flatus and able to tolerate diet.   Discharge Exam: Vitals:   09/27/21 0437 09/27/21 1141  BP: 139/67 (!) 158/70  Pulse: 70 79  Resp: 16 (!) 23  Temp: 98.5 F (36.9 C) 98.3 F (36.8 C)  SpO2: 94% 94%   Vitals:   09/26/21 1939 09/26/21 2346 09/27/21 0437 09/27/21 1141  BP: (!) 144/73 (!) 142/54 139/67 (!) 158/70  Pulse: 86 70 70 79  Resp:  16 16 16  (!) 23  Temp: 97.8 F (36.6 C) 98 F (36.7 C) 98.5 F (36.9 C) 98.3 F (36.8 C)  TempSrc: Oral Axillary  Oral  SpO2: 96% 96% 94% 94%  Weight:      Height:        General: Pt is alert, awake, not in acute distress Cardiovascular: RRR, S1/S2 +, no rubs, no gallops Respiratory: CTA bilaterally, no wheezing, no rhonchi Abdominal: Soft, NT, ND, bowel sounds + Extremities: no edema, no cyanosis    The results of significant diagnostics from this hospitalization (including imaging, microbiology, ancillary and laboratory) are listed below for reference.     Microbiology: Recent Results (from the past 240 hour(s))  Resp Panel by RT-PCR (Flu A&B, Covid) Nasopharyngeal Swab     Status: None   Collection Time: 09/25/21 11:21 PM   Specimen: Nasopharyngeal Swab; Nasopharyngeal(NP) swabs in vial transport medium  Result Value Ref Range Status   SARS  Coronavirus 2 by RT PCR NEGATIVE NEGATIVE Final    Comment: (NOTE) SARS-CoV-2 target nucleic acids are NOT DETECTED.  The SARS-CoV-2 RNA is generally detectable in upper respiratory specimens during the acute phase of infection. The lowest concentration of SARS-CoV-2 viral copies this assay can detect is 138 copies/mL. A negative result does not preclude SARS-Cov-2 infection and should not be used as the sole basis for treatment or other patient management decisions. A negative result may occur with  improper specimen collection/handling, submission of specimen other than nasopharyngeal swab, presence of viral mutation(s) within the areas targeted by this assay, and inadequate number of viral copies(<138 copies/mL). A negative result must be combined with clinical observations, patient history, and epidemiological information. The expected result is Negative.  Fact Sheet for Patients:  EntrepreneurPulse.com.au  Fact Sheet for Healthcare Providers:  IncredibleEmployment.be  This test is no t yet approved or cleared by the [place] FDA and  has been authorized for detection and/or diagnosis of SARS-CoV-2 by FDA under an Emergency Use Authorization (EUA). This EUA will remain  in effect (meaning this test can be used) for the duration of the COVID-19 declaration under Section 564(b)(1) of the Act, 21 U.S.C.section 360bbb-3(b)(1), unless the authorization is terminated  or revoked sooner.       Influenza A by PCR NEGATIVE NEGATIVE Final   Influenza B by PCR NEGATIVE NEGATIVE Final    Comment: (NOTE) The Xpert Xpress SARS-CoV-2/FLU/RSV plus assay is intended as an aid in the diagnosis of influenza from Nasopharyngeal swab specimens and should not be used as a sole basis for treatment. Nasal washings and aspirates are unacceptable for Xpert Xpress SARS-CoV-2/FLU/RSV testing.  Fact Sheet for  Patients: EntrepreneurPulse.com.au  Fact Sheet for Healthcare Providers: IncredibleEmployment.be  This test is not yet approved or cleared by the [place] FDA and has been authorized for detection and/or diagnosis of SARS-CoV-2 by FDA under an Emergency Use Authorization (EUA). This EUA will remain in effect (meaning this test can be used) for the duration of the COVID-19 declaration under Section 564(b)(1) of the Act, 21 U.S.C. section 360bbb-3(b)(1), unless the authorization is terminated or revoked.  Performed at [facility], [place] [address] [place], [place] [address]      Labs: BNP (last 3 results) Recent Labs    09/26/21 0324  BNP 712.4*   Basic Metabolic Panel: Recent Labs  Lab 09/25/21 2130 09/26/21 0905 09/27/21 0435  NA 137 138 139  K 4.1 4.0 3.8  CL 102 107 108  CO2 24 26 26  GLUCOSE 153* 115* 114*  BUN 25* 20 16  CREATININE 1.04* 1.03* 0.98  CALCIUM 10.2 9.0 9.2  MG 2.0 2.1  --   PHOS 3.3  --   --    Liver Function Tests: Recent Labs  Lab 09/25/21 2130 09/26/21 0905  AST 18 18  ALT 12 11  ALKPHOS 75 61  BILITOT 1.1 0.7  PROT 8.4* 6.6  ALBUMIN 4.3 3.4*   Recent Labs  Lab 09/25/21 2130  LIPASE 29   No results for input(s): AMMONIA in the last 168 hours. CBC: Recent Labs  Lab 09/25/21 2130 09/26/21 0905 09/27/21 0435  WBC 17.1* 11.5* 7.8  NEUTROABS 13.3* 8.9*  --   HGB 14.4 11.8* 12.1  HCT 43.5 36.0 36.7  MCV 91.4 92.5 91.8  PLT 222 201 181   Cardiac Enzymes: Recent Labs  Lab 09/25/21 2130  CKTOTAL 44   BNP: Invalid input(s): POCBNP CBG: Recent Labs  Lab 09/26/21 1927 09/26/21 2339 09/27/21 0301 09/27/21 0730 09/27/21 1137  GLUCAP 144* 88 105* 107* 141*   D-Dimer No results for input(s): DDIMER in the last 72 hours. Hgb A1c Recent Labs    09/26/21 0030  HGBA1C 6.1*   Lipid Profile No results for input(s): CHOL, HDL, LDLCALC, TRIG, CHOLHDL, LDLDIRECT  in the last 72 hours. Thyroid function studies Recent Labs    09/27/21 0435  TSH 3.098   Anemia work up No results for input(s): VITAMINB12, FOLATE, FERRITIN, TIBC, IRON, RETICCTPCT in the last 72 hours. Urinalysis    Component Value Date/Time   COLORURINE YELLOW 09/25/2021 2130   APPEARANCEUR CLEAR 09/25/2021 2130   LABSPEC 1.022 09/25/2021 2130   PHURINE 5.0 09/25/2021 2130   GLUCOSEU NEGATIVE 09/25/2021 2130   HGBUR NEGATIVE 09/25/2021 2130   [place] NEGATIVE 09/25/2021 2130   KETONESUR 5 (A) 09/25/2021 2130   PROTEINUR >=300 (A) 09/25/2021 2130   NITRITE NEGATIVE 09/25/2021 2130   LEUKOCYTESUR NEGATIVE 09/25/2021 2130   Sepsis Labs Invalid input(s): PROCALCITONIN,  WBC,  LACTICIDVEN Microbiology Recent Results (from the past 240 hour(s))  Resp Panel by RT-PCR (Flu A&B, Covid) Nasopharyngeal Swab     Status: None   Collection Time: 09/25/21 11:21 PM   Specimen: Nasopharyngeal Swab; Nasopharyngeal(NP) swabs in vial transport medium  Result Value Ref Range Status   SARS Coronavirus 2 by RT PCR NEGATIVE NEGATIVE Final    Comment: (NOTE) SARS-CoV-2 target nucleic acids are NOT DETECTED.  The SARS-CoV-2 RNA is generally detectable in upper respiratory specimens during the acute phase of infection. The lowest concentration of SARS-CoV-2 viral copies this assay can detect is 138 copies/mL. A negative result does not preclude SARS-Cov-2 infection and should not be used as the sole basis for treatment or other patient management decisions. A negative result may occur with  improper specimen collection/handling, submission of specimen other than nasopharyngeal swab, presence of viral mutation(s) within the areas targeted by this assay, and inadequate number of viral copies(<138 copies/mL). A negative result must be combined with clinical observations, patient history, and epidemiological information. The expected result is Negative.  Fact Sheet for Patients:   EntrepreneurPulse.com.au  Fact Sheet for Healthcare Providers:  IncredibleEmployment.be  This test is no t yet approved or cleared by the [place] FDA and  has been authorized for detection and/or diagnosis of SARS-CoV-2 by FDA under an Emergency Use Authorization (EUA). This EUA will remain  in effect (meaning this test can be used) for the duration of the COVID-19 declaration under Section 564(b)(1) of the Act, 21  U.S.C.section 360bbb-3(b)(1), unless the authorization is terminated  or revoked sooner.       Influenza A by PCR NEGATIVE NEGATIVE Final   Influenza B by PCR NEGATIVE NEGATIVE Final    Comment: (NOTE) The Xpert Xpress SARS-CoV-2/FLU/RSV plus assay is intended as an aid in the diagnosis of influenza from Nasopharyngeal swab specimens and should not be used as a sole basis for treatment. Nasal washings and aspirates are unacceptable for Xpert Xpress SARS-CoV-2/FLU/RSV testing.  Fact Sheet for Patients: EntrepreneurPulse.com.au  Fact Sheet for Healthcare Providers: IncredibleEmployment.be  This test is not yet approved or cleared by the [place] FDA and has been authorized for detection and/or diagnosis of SARS-CoV-2 by FDA under an Emergency Use Authorization (EUA). This EUA will remain in effect (meaning this test can be used) for the duration of the COVID-19 declaration under Section 564(b)(1) of the Act, 21 U.S.C. section 360bbb-3(b)(1), unless the authorization is terminated or revoked.  Performed at [facility], [place] [address] [place], [place] [address]      Time coordinating discharge: 35 minutes.   SIGNED:   Hosie Poisson, MD  [facility] 09/28/2021, 7:18 PM

## 2021-10-02 ENCOUNTER — Other Ambulatory Visit: Payer: Self-pay

## 2021-10-02 ENCOUNTER — Ambulatory Visit (HOSPITAL_COMMUNITY)
Admission: RE | Admit: 2021-10-02 | Discharge: 2021-10-02 | Disposition: A | Discharge status: Home / Self Care | Payer: Medicare Other | Source: Ambulatory Visit | Attending: Internal Medicine | Admitting: Internal Medicine

## 2021-10-02 DIAGNOSIS — I6523 Occlusion and stenosis of bilateral carotid arteries: Secondary | ICD-10-CM | POA: Diagnosis not present

## 2021-10-03 ENCOUNTER — Other Ambulatory Visit: Payer: Self-pay | Admitting: Interventional Cardiology

## 2021-10-03 DIAGNOSIS — Z79899 Other long term (current) drug therapy: Secondary | ICD-10-CM | POA: Diagnosis not present

## 2021-10-03 DIAGNOSIS — K56600 Partial intestinal obstruction, unspecified as to cause: Secondary | ICD-10-CM | POA: Diagnosis not present

## 2021-10-03 DIAGNOSIS — J690 Pneumonitis due to inhalation of food and vomit: Secondary | ICD-10-CM | POA: Diagnosis not present

## 2021-10-03 DIAGNOSIS — Z139 Encounter for screening, unspecified: Secondary | ICD-10-CM | POA: Diagnosis not present

## 2021-10-03 DIAGNOSIS — I6521 Occlusion and stenosis of right carotid artery: Secondary | ICD-10-CM | POA: Diagnosis not present

## 2021-10-12 ENCOUNTER — Telehealth: Payer: Self-pay | Admitting: Interventional Cardiology

## 2021-10-12 NOTE — Telephone Encounter (Signed)
Left message for daughter to call back.  

## 2021-10-12 NOTE — Telephone Encounter (Signed)
Patient's daughter called and wants to know more about the results from the doppler. Please call back to discuss

## 2021-10-16 NOTE — Telephone Encounter (Signed)
Left message to call back  

## 2021-10-23 ENCOUNTER — Encounter (HOSPITAL_COMMUNITY): Payer: Self-pay | Admitting: Certified Registered Nurse Anesthetist

## 2021-10-23 ENCOUNTER — Other Ambulatory Visit: Payer: Self-pay

## 2021-10-23 ENCOUNTER — Emergency Department (HOSPITAL_COMMUNITY): Payer: Medicare Other

## 2021-10-23 ENCOUNTER — Encounter (HOSPITAL_COMMUNITY)
Admission: EM | Disposition: A | Discharge status: Home Health | Payer: Self-pay | Source: Home / Self Care | Attending: Neurology

## 2021-10-23 ENCOUNTER — Inpatient Hospital Stay (HOSPITAL_COMMUNITY)
Admission: EM | Admit: 2021-10-23 | Discharge: 2021-10-25 | DRG: 062 | Disposition: A | Discharge status: Home Health | Payer: Medicare Other | Attending: Neurology | Admitting: Neurology

## 2021-10-23 ENCOUNTER — Encounter (HOSPITAL_COMMUNITY): Payer: Self-pay | Admitting: Neurology

## 2021-10-23 ENCOUNTER — Inpatient Hospital Stay (HOSPITAL_COMMUNITY): Payer: Medicare Other

## 2021-10-23 DIAGNOSIS — I161 Hypertensive emergency: Secondary | ICD-10-CM | POA: Diagnosis not present

## 2021-10-23 DIAGNOSIS — I2510 Atherosclerotic heart disease of native coronary artery without angina pectoris: Secondary | ICD-10-CM | POA: Diagnosis present

## 2021-10-23 DIAGNOSIS — M109 Gout, unspecified: Secondary | ICD-10-CM | POA: Diagnosis not present

## 2021-10-23 DIAGNOSIS — I63432 Cerebral infarction due to embolism of left posterior cerebral artery: Secondary | ICD-10-CM | POA: Diagnosis not present

## 2021-10-23 DIAGNOSIS — G319 Degenerative disease of nervous system, unspecified: Secondary | ICD-10-CM | POA: Diagnosis not present

## 2021-10-23 DIAGNOSIS — Z7982 Long term (current) use of aspirin: Secondary | ICD-10-CM

## 2021-10-23 DIAGNOSIS — G459 Transient cerebral ischemic attack, unspecified: Principal | ICD-10-CM | POA: Diagnosis present

## 2021-10-23 DIAGNOSIS — I639 Cerebral infarction, unspecified: Secondary | ICD-10-CM

## 2021-10-23 DIAGNOSIS — R4781 Slurred speech: Secondary | ICD-10-CM | POA: Diagnosis present

## 2021-10-23 DIAGNOSIS — I10 Essential (primary) hypertension: Secondary | ICD-10-CM | POA: Diagnosis not present

## 2021-10-23 DIAGNOSIS — Z7902 Long term (current) use of antithrombotics/antiplatelets: Secondary | ICD-10-CM

## 2021-10-23 DIAGNOSIS — R471 Dysarthria and anarthria: Secondary | ICD-10-CM | POA: Diagnosis not present

## 2021-10-23 DIAGNOSIS — R1310 Dysphagia, unspecified: Secondary | ICD-10-CM | POA: Diagnosis present

## 2021-10-23 DIAGNOSIS — I6521 Occlusion and stenosis of right carotid artery: Secondary | ICD-10-CM | POA: Diagnosis not present

## 2021-10-23 DIAGNOSIS — Z20822 Contact with and (suspected) exposure to covid-19: Secondary | ICD-10-CM | POA: Diagnosis not present

## 2021-10-23 DIAGNOSIS — F0280 Dementia in other diseases classified elsewhere without behavioral disturbance: Secondary | ICD-10-CM | POA: Diagnosis present

## 2021-10-23 DIAGNOSIS — Z8673 Personal history of transient ischemic attack (TIA), and cerebral infarction without residual deficits: Secondary | ICD-10-CM

## 2021-10-23 DIAGNOSIS — Z87891 Personal history of nicotine dependence: Secondary | ICD-10-CM | POA: Diagnosis not present

## 2021-10-23 DIAGNOSIS — Z743 Need for continuous supervision: Secondary | ICD-10-CM | POA: Diagnosis not present

## 2021-10-23 DIAGNOSIS — I679 Cerebrovascular disease, unspecified: Secondary | ICD-10-CM | POA: Diagnosis not present

## 2021-10-23 DIAGNOSIS — R29810 Facial weakness: Secondary | ICD-10-CM | POA: Diagnosis present

## 2021-10-23 DIAGNOSIS — Z6830 Body mass index (BMI) 30.0-30.9, adult: Secondary | ICD-10-CM

## 2021-10-23 DIAGNOSIS — R4020 Unspecified coma: Secondary | ICD-10-CM | POA: Diagnosis not present

## 2021-10-23 DIAGNOSIS — E669 Obesity, unspecified: Secondary | ICD-10-CM | POA: Diagnosis present

## 2021-10-23 DIAGNOSIS — R29721 NIHSS score 21: Secondary | ICD-10-CM | POA: Diagnosis present

## 2021-10-23 DIAGNOSIS — I160 Hypertensive urgency: Secondary | ICD-10-CM | POA: Diagnosis not present

## 2021-10-23 DIAGNOSIS — I252 Old myocardial infarction: Secondary | ICD-10-CM | POA: Diagnosis not present

## 2021-10-23 DIAGNOSIS — Z9282 Status post administration of tPA (rtPA) in a different facility within the last 24 hours prior to admission to current facility: Secondary | ICD-10-CM | POA: Diagnosis not present

## 2021-10-23 DIAGNOSIS — G8194 Hemiplegia, unspecified affecting left nondominant side: Secondary | ICD-10-CM | POA: Diagnosis not present

## 2021-10-23 DIAGNOSIS — R6889 Other general symptoms and signs: Secondary | ICD-10-CM | POA: Diagnosis not present

## 2021-10-23 DIAGNOSIS — Z85820 Personal history of malignant melanoma of skin: Secondary | ICD-10-CM

## 2021-10-23 DIAGNOSIS — I6601 Occlusion and stenosis of right middle cerebral artery: Secondary | ICD-10-CM | POA: Diagnosis not present

## 2021-10-23 DIAGNOSIS — R29898 Other symptoms and signs involving the musculoskeletal system: Secondary | ICD-10-CM | POA: Diagnosis not present

## 2021-10-23 DIAGNOSIS — E785 Hyperlipidemia, unspecified: Secondary | ICD-10-CM | POA: Diagnosis not present

## 2021-10-23 DIAGNOSIS — I669 Occlusion and stenosis of unspecified cerebral artery: Secondary | ICD-10-CM | POA: Diagnosis not present

## 2021-10-23 DIAGNOSIS — E7800 Pure hypercholesterolemia, unspecified: Secondary | ICD-10-CM | POA: Diagnosis not present

## 2021-10-23 DIAGNOSIS — Z79899 Other long term (current) drug therapy: Secondary | ICD-10-CM | POA: Diagnosis not present

## 2021-10-23 DIAGNOSIS — R404 Transient alteration of awareness: Secondary | ICD-10-CM | POA: Diagnosis not present

## 2021-10-23 LAB — CBC
HCT: 42.4 % (ref 36.0–46.0)
Hemoglobin: 13.8 g/dL (ref 12.0–15.0)
MCH: 30.1 pg (ref 26.0–34.0)
MCHC: 32.5 g/dL (ref 30.0–36.0)
MCV: 92.6 fL (ref 80.0–100.0)
Platelets: 168 10*3/uL (ref 150–400)
RBC: 4.58 MIL/uL (ref 3.87–5.11)
RDW: 13.8 % (ref 11.5–15.5)
WBC: 8.2 10*3/uL (ref 4.0–10.5)
nRBC: 0 % (ref 0.0–0.2)

## 2021-10-23 LAB — DIFFERENTIAL
Abs Immature Granulocytes: 0.05 10*3/uL (ref 0.00–0.07)
Basophils Absolute: 0.1 10*3/uL (ref 0.0–0.1)
Basophils Relative: 1 %
Eosinophils Absolute: 0.2 10*3/uL (ref 0.0–0.5)
Eosinophils Relative: 2 %
Immature Granulocytes: 1 %
Lymphocytes Relative: 21 %
Lymphs Abs: 1.7 10*3/uL (ref 0.7–4.0)
Monocytes Absolute: 0.7 10*3/uL (ref 0.1–1.0)
Monocytes Relative: 8 %
Neutro Abs: 5.6 10*3/uL (ref 1.7–7.7)
Neutrophils Relative %: 67 %

## 2021-10-23 LAB — I-STAT CHEM 8, ED
BUN: 29 mg/dL — ABNORMAL HIGH (ref 8–23)
Calcium, Ion: 1.08 mmol/L — ABNORMAL LOW (ref 1.15–1.40)
Chloride: 110 mmol/L (ref 98–111)
Creatinine, Ser: 1 mg/dL (ref 0.44–1.00)
Glucose, Bld: 91 mg/dL (ref 70–99)
HCT: 42 % (ref 36.0–46.0)
Hemoglobin: 14.3 g/dL (ref 12.0–15.0)
Potassium: 4.3 mmol/L (ref 3.5–5.1)
Sodium: 140 mmol/L (ref 135–145)
TCO2: 24 mmol/L (ref 22–32)

## 2021-10-23 LAB — PROTIME-INR
INR: 1 (ref 0.8–1.2)
Prothrombin Time: 13 seconds (ref 11.4–15.2)

## 2021-10-23 LAB — COMPREHENSIVE METABOLIC PANEL
ALT: 10 U/L (ref 0–44)
AST: 14 U/L — ABNORMAL LOW (ref 15–41)
Albumin: 3.8 g/dL (ref 3.5–5.0)
Alkaline Phosphatase: 72 U/L (ref 38–126)
Anion gap: 12 (ref 5–15)
BUN: 23 mg/dL (ref 8–23)
CO2: 20 mmol/L — ABNORMAL LOW (ref 22–32)
Calcium: 9.4 mg/dL (ref 8.9–10.3)
Chloride: 107 mmol/L (ref 98–111)
Creatinine, Ser: 0.98 mg/dL (ref 0.44–1.00)
GFR, Estimated: 56 mL/min — ABNORMAL LOW (ref >60)
Glucose, Bld: 93 mg/dL (ref 70–99)
Potassium: 4.3 mmol/L (ref 3.5–5.1)
Sodium: 139 mmol/L (ref 135–145)
Total Bilirubin: 0.7 mg/dL (ref 0.3–1.2)
Total Protein: 7.2 g/dL (ref 6.5–8.1)

## 2021-10-23 LAB — URINALYSIS, COMPLETE (UACMP) WITH MICROSCOPIC
Bacteria, UA: NONE SEEN
Bilirubin Urine: NEGATIVE
Glucose, UA: NEGATIVE mg/dL
Ketones, ur: NEGATIVE mg/dL
Leukocytes,Ua: NEGATIVE
Nitrite: NEGATIVE
Protein, ur: NEGATIVE mg/dL
Specific Gravity, Urine: 1.017 (ref 1.005–1.030)
pH: 5 (ref 5.0–8.0)

## 2021-10-23 LAB — APTT: aPTT: 24 seconds (ref 24–36)

## 2021-10-23 LAB — RAPID URINE DRUG SCREEN, HOSP PERFORMED
Amphetamines: NOT DETECTED
Barbiturates: NOT DETECTED
Benzodiazepines: NOT DETECTED
Cocaine: NOT DETECTED
Opiates: NOT DETECTED
Tetrahydrocannabinol: NOT DETECTED

## 2021-10-23 LAB — CBG MONITORING, ED: Glucose-Capillary: 76 mg/dL (ref 70–99)

## 2021-10-23 SURGERY — IR WITH ANESTHESIA
Anesthesia: Choice

## 2021-10-23 MED ORDER — TENECTEPLASE FOR STROKE
0.2500 mg/kg | PACK | Freq: Once | INTRAVENOUS | Status: AC
  Administered 2021-10-23: 18:00:00 19 mg via INTRAVENOUS

## 2021-10-23 MED ORDER — ACETAMINOPHEN 325 MG PO TABS: 650.0000 mg | ORAL_TABLET | ORAL | Status: DC | PRN

## 2021-10-23 MED ORDER — EPTIFIBATIDE 20 MG/10ML IV SOLN
INTRAVENOUS | Status: AC
  Filled 2021-10-23: qty 10

## 2021-10-23 MED ORDER — ACETAMINOPHEN 650 MG RE SUPP: 650.0000 mg | RECTAL | Status: DC | PRN

## 2021-10-23 MED ORDER — TIROFIBAN HCL IN NACL 5-0.9 MG/100ML-% IV SOLN
INTRAVENOUS | Status: AC
  Filled 2021-10-23: qty 100

## 2021-10-23 MED ORDER — ACETAMINOPHEN 160 MG/5ML PO SOLN: 650.0000 mg | ORAL | Status: DC | PRN

## 2021-10-23 MED ORDER — SENNOSIDES-DOCUSATE SODIUM 8.6-50 MG PO TABS: 1.0000 | ORAL_TABLET | Freq: Every evening | ORAL | Status: DC | PRN

## 2021-10-23 MED ORDER — SODIUM CHLORIDE 0.9 % IV SOLN: INTRAVENOUS | Status: DC

## 2021-10-23 MED ORDER — SODIUM CHLORIDE 0.9% FLUSH
3.0000 mL | Freq: Once | INTRAVENOUS | Status: DC
Start: 2021-10-23 — End: 2021-10-25

## 2021-10-23 MED ORDER — NALOXONE HCL 0.4 MG/ML IJ SOLN
0.4000 mg | Freq: Once | INTRAMUSCULAR | Status: AC
  Administered 2021-10-23: 18:00:00 0.4 mg via INTRAVENOUS

## 2021-10-23 MED ORDER — CLEVIDIPINE BUTYRATE 0.5 MG/ML IV EMUL
INTRAVENOUS | Status: AC
  Filled 2021-10-23: qty 50

## 2021-10-23 MED ORDER — CHLORHEXIDINE GLUCONATE CLOTH 2 % EX PADS
6.0000 | MEDICATED_PAD | Freq: Every day | CUTANEOUS | Status: DC
  Administered 2021-10-23 – 2021-10-24 (×2): 6 via TOPICAL

## 2021-10-23 MED ORDER — VERAPAMIL HCL 2.5 MG/ML IV SOLN
INTRAVENOUS | Status: AC
  Filled 2021-10-23: qty 2

## 2021-10-23 MED ORDER — STROKE: EARLY STAGES OF RECOVERY BOOK
Freq: Once | Status: AC
  Filled 2021-10-23: qty 1

## 2021-10-23 MED ORDER — FENTANYL CITRATE (PF) 100 MCG/2ML IJ SOLN
INTRAMUSCULAR | Status: AC
  Filled 2021-10-23: qty 2

## 2021-10-23 MED ORDER — TICAGRELOR 90 MG PO TABS
ORAL_TABLET | ORAL | Status: AC
  Filled 2021-10-23: qty 2

## 2021-10-23 MED ORDER — PANTOPRAZOLE SODIUM 40 MG IV SOLR
40.0000 mg | Freq: Every day | INTRAVENOUS | Status: DC
  Filled 2021-10-23: qty 40

## 2021-10-23 MED ORDER — CANGRELOR TETRASODIUM 50 MG IV SOLR
INTRAVENOUS | Status: AC
  Filled 2021-10-23: qty 50

## 2021-10-23 MED ORDER — IOHEXOL 350 MG/ML SOLN
75.0000 mL | Freq: Once | INTRAVENOUS | Status: AC | PRN
  Administered 2021-10-23: 18:00:00 75 mL via INTRAVENOUS

## 2021-10-23 MED ORDER — CLOPIDOGREL BISULFATE 300 MG PO TABS
ORAL_TABLET | ORAL | Status: AC
  Filled 2021-10-23: qty 1

## 2021-10-23 MED ORDER — ASPIRIN 81 MG PO CHEW
CHEWABLE_TABLET | ORAL | Status: AC
  Filled 2021-10-23: qty 1

## 2021-10-23 NOTE — Progress Notes (Signed)
PHARMACIST CODE STROKE RESPONSE  Notified to mix TNK at [place] by Dr. Theda Sers Delivered TNK to RN at 1800 given at 1801  TNK dose = 19 mg IV over 5 seconds  Issues/delays encountered (if applicable): n/a  Heloise Purpura 10/23/21 6:02 PM

## 2021-10-23 NOTE — ED Notes (Signed)
Neurologist Dr Earnestine Leys assessed pt d/t concerns reported by family regarding tongue biting. Recommends monitoring for potential tongue swelling. No new orders.

## 2021-10-23 NOTE — ED Triage Notes (Signed)
Pt here via EMS d/t code stroke. Symptoms are slurred speech, L sided facial droop and left sided weakness. Pt at home sitting on couch, daughter noticed slurred speech and pt then became unresponsive.   142/88 HR 78 CBG 99

## 2021-10-23 NOTE — ED Notes (Signed)
On assessment noted pt has bitten left side of her tongue. No bleeding at this time. Family at bedside states this was not present before. Pt does not have known history of seizures.

## 2021-10-23 NOTE — H&P (Signed)
Neurology H&P  Reason for Consult: Code Stroke Referring Physician: Dr. Billy Fischer  CC: Patient is unable to provide a chief complaint due to patient condition  History is obtained from: EMS, Patient's daughter at bedside, Chart review  HPI: Katelyn Powers is a 87 y.o. female with a medical history significant for hyperlipidemia, essential hypertension, dementia, NSTEMI on ASA and clopidogrel, and carotid artery disease who presented to the ED via EMS from home after having a witnessed neurologic change in front of her daughter. This afternoon, the patient was in her normal state of health talking in the kitchen with her daughter with the patient suddenly had slurred speech, was complaining of vision changes, and then became unresponsive. On evaluation from EMS, patient initially was flaccid on the left side and a Code Stroke was activated but the unilateral weakness had resolved prior to hospital arrival.   At baseline, Katelyn Powers lives with her daughter but is able to complete most of her ADLs independently. She walks and is able to dress herself and feed herself. Her daughter does assist with bathing minimally but she does need some help.   LKW: 16:21 TNK given?: yes, initially patient's strength had improved and initial review of images was unrevealing but once back in her ED room, patient became more somnolent. Images were reviewed again with evidence of a focal non opacification of the left P1 and TNK was administered at 1801. IR Thrombectomy? No, patient with significant improvement following TNK and family decided they no longer wanted the patient to undergo the procedure Modified Rankin Scale: 3-Moderate disability-requires help but walks WITHOUT assistance  ROS: Unable to obtain due to altered mental status.   Past Medical History:  Diagnosis Date   Cataract    Gout    Hyperlipidemia    Hypertension    Memory loss    NSTEMI (non-ST elevated myocardial infarction) ([place]) 08/2020    Right-sided carotid artery disease ([place]) 09/17/2020   carotid doppler wth >60% occlusion   Past Surgical History:  Procedure Laterality Date   MELANOMA EXCISION Right 10/28/1970   level 2   SUPERFICIAL LYMPH NODE BIOPSY / EXCISION  10/28/1982   amelanotic melanoma   TUBAL LIGATION     Family History  Problem Relation Age of Onset   Colon cancer Sister 17   Colon polyps Brother    Social History:   reports that she has quit smoking. She has never used smokeless tobacco. She reports that she does not drink alcohol and does not use drugs.  Medications  Current Facility-Administered Medications:     stroke: mapping our early stages of recovery book, , Does not apply, Once, Toberman, Stevi W, NP   0.9 %  sodium chloride infusion, , Intravenous, Continuous, Toberman, Stevi W, NP   acetaminophen (TYLENOL) tablet 650 mg, 650 mg, Oral, Q4H PRN **OR** acetaminophen (TYLENOL) 160 MG/5ML solution 650 mg, 650 mg, Per Tube, Q4H PRN **OR** acetaminophen (TYLENOL) suppository 650 mg, 650 mg, Rectal, Q4H PRN, Ebbie Latus, Stevi W, NP   pantoprazole (PROTONIX) injection 40 mg, 40 mg, Intravenous, QHS, Toberman, Stevi W, NP   senna-docusate (Senokot-S) tablet 1 tablet, 1 tablet, Oral, QHS PRN, Rikki Spearing, NP   sodium chloride flush (NS) 0.9 % injection 3 mL, 3 mL, Intravenous, Once, Gareth Morgan, MD   ticagrelor (BRILINTA) 90 MG tablet, , , ,   Current Outpatient Medications:    amLODipine (NORVASC) 5 MG tablet, Take 1 tablet (5 mg total) by mouth daily., Disp: , Rfl:  aspirin 81 MG tablet, Take 81 mg by mouth daily., Disp: , Rfl:    Cholecalciferol (VITAMIN D3 PO), Take 1 Dose by mouth daily. Vit D3 (4,000U), Vit A, K2+ 4 immune boosters, Disp: , Rfl:    clopidogrel (PLAVIX) 75 MG tablet, TAKE 1 TABLET BY MOUTH  DAILY (Patient taking differently: Take 75 mg by mouth daily.), Disp: 90 tablet, Rfl: 3   Ginkgo Biloba 120 MG CAPS, Take 550 mg by mouth in the morning., Disp: , Rfl:    losartan  (COZAAR) 50 MG tablet, Take 1 tablet (50 mg total) by mouth daily., Disp: , Rfl:    Melatonin 10 MG TABS, Take 10 mg by mouth at bedtime. NEEDS NIGHTLY TO SLEEP OR WILL BE RESTLESS, Disp: , Rfl:    nitroGLYCERIN (NITROSTAT) 0.4 MG SL tablet, Place 1 tablet (0.4 mg total) under the tongue every 5 (five) minutes as needed for chest pain., Disp: 25 tablet, Rfl: 2   Omega-3-acid Eth &Multivit/Min (OMEGA-3 RX COMPLETE PO), Take 1 capsule by mouth at bedtime., Disp: , Rfl:    ondansetron (ZOFRAN ODT) 4 MG disintegrating tablet, Take 1 tablet (4 mg total) by mouth every 8 (eight) hours as needed for nausea or vomiting., Disp: 20 tablet, Rfl: 0  Exam: Current vital signs: BP (!) 160/74    Pulse 73    Temp 98.1 F (36.7 C)    Resp 15    Ht 5\' 2"  (1.575 m)    Wt 75.4 kg Comment: today   SpO2 98%    BMI 30.40 kg/m  Vital signs in last 24 hours: Temp:  [98 F (36.7 C)-98.1 F (36.7 C)] 98.1 F (36.7 C) (12/27 1845) Pulse Rate:  [58-79] 73 (12/27 1915) Resp:  [11-17] 15 (12/27 1915) BP: (142-172)/(66-157) 160/74 (12/27 1915) SpO2:  [96 %-100 %] 98 % (12/27 1915) Weight:  [75.4 kg] 75.4 kg (12/27 1906)  GENERAL: Somnolent, ill appearing female laying on CT table with some agitation with stimulation  Psych: Unable to assess due to patient's condition Head: Normocephalic and atraumatic, without obvious abnormality EENT: Normal conjunctivae, dry mucous membranes, no OP obstruction LUNGS: Patient has some snoring respirations with a decreased respiratory rate initially CV: Regular rate and rhythm on telemetry ABDOMEN: Soft, non-tender, non-distended Extremities: warm, well perfused, without obvious deformity  NEURO:  Mental Status: Patient is lethargic and does not verbalize throughout examination.  She is unable to provide a clear and coherent history of present illness. She does not attempt to name objects or repeat phrases.  She follows simple commands No neglect is noted Cranial Nerves:  II:  PERRL 2 mm/brisk.  III, IV, VI: EOMI without gaze preference.  V: Does not blink to threat throughout VII: Face is symmetric resting and with movement.  VIII: Hearing is intact to voice IX, X: Does not allow for visualization of soft palate XI: Head is grossly midline, shoulders shrug symmetrically XII: Does not protrude tongue to command  Motor: 5/5 strength is all muscle groups.  Tone is normal. Bulk is normal.  Sensation: Intact to light touch bilaterally in all four extremities. No extinction to DSS present.  Coordination: FTN intact bilaterally. HKS intact bilaterally. No pronator drift. Alternating hand movements.  DTRs: 2+ throughout.  Gait: Deferred  NIHSS: 1a Level of Conscious.: 1 1b LOC Questions: 2 1c LOC Commands: 2 2 Best Gaze: 0 3 Visual: 3 4 Facial Palsy: 0 5a Motor Arm - left: 2 5b Motor Arm - Right: 2 6a Motor Leg - Left:  2 6b Motor Leg - Right: 2 7 Limb Ataxia: 0 8 Sensory: 0 9 Best Language: 3 10 Dysarthria: 2 11 Extinct. and Inatten.: 0 TOTAL: 21  Labs I have reviewed labs in epic and the results pertinent to this consultation are: CBC    Component Value Date/Time   WBC 8.2 10/23/2021 1735   RBC 4.58 10/23/2021 1735   HGB 14.3 10/23/2021 1744   HCT 42.0 10/23/2021 1744   PLT 168 10/23/2021 1735   MCV 92.6 10/23/2021 1735   MCH 30.1 10/23/2021 1735   MCHC 32.5 10/23/2021 1735   RDW 13.8 10/23/2021 1735   LYMPHSABS 1.7 10/23/2021 1735   MONOABS 0.7 10/23/2021 1735   EOSABS 0.2 10/23/2021 1735   BASOSABS 0.1 10/23/2021 1735   CMP     Component Value Date/Time   NA 140 10/23/2021 1744   K 4.3 10/23/2021 1744   CL 110 10/23/2021 1744   CO2 20 (L) 10/23/2021 1735   GLUCOSE 91 10/23/2021 1744   BUN 29 (H) 10/23/2021 1744   CREATININE 1.00 10/23/2021 1744   CALCIUM 9.4 10/23/2021 1735   PROT 7.2 10/23/2021 1735   ALBUMIN 3.8 10/23/2021 1735   AST 14 (L) 10/23/2021 1735   ALT 10 10/23/2021 1735   ALKPHOS 72 10/23/2021 1735   BILITOT 0.7  10/23/2021 1735   GFRNONAA 56 (L) 10/23/2021 1735   GFRAA 56 (L) 05/10/2020 0150   Lipid Panel     Component Value Date/Time   CHOL 234 (H) 09/04/2020 0918   TRIG 220 (H) 09/04/2020 0918   HDL 52 09/04/2020 0918   CHOLHDL 4.5 09/04/2020 0918   VLDL 44 (H) 09/04/2020 0918   LDLCALC 138 (H) 09/04/2020 0918   Lab Results  Component Value Date   HGBA1C 6.1 (H) 09/26/2021   Imaging I have reviewed the images obtained:  CT-scan of the brain 12/27: 1. Right cerebellar infarct, of indeterminate acuity, new compared to 04/09/2021, but favored to be remote. No other acute intracranial process. 2. ASPECTS is 10  CT angio head and neck 12/27: 1. Focal non opacification of the left P1. Severe focal stenosis in the proximal right M1, right A1, and left P2. 2. 65-70% stenosis in the proximal right ICA, secondary to calcified and noncalcified plaque. No other hemodynamically significant stenosis in the neck. 3. 50-60% stenosis in the proximal left subclavian artery, secondary to calcified plaque.  Assessment: 87 y.o. female with PMHx as above who presented to the ED as a Code Stroke for evaluation of sudden onset of slurred speech, vision changes, and left-sided weakness. - Examination reveals patient without focal weakness but with decreased LOC, nonverbal, and not following commands with an initial NIHSS of 21. - CTH was without acute intracranial process but with a right cerebellar infarct new in the past 6 months- but not responsible for her current presentation. CT angio imaging was obtained revealing a focal non opacification of the left P1.  - TNKase was administered following review of angiography imaging with significant improvement in patient's presentation. Family initially consented for IR but with improvement in patient's status, did not wish to proceed and IR was cancelled.  - Stroke risk factors include patient's advanced age, HTN, HLD, and history of NSTEMI/CAD.  Plan: Acute  Ischemic Stroke Cerebral infarction due to embolism of left posterior cerebral artery   Acuity: Acute Current Suspected Etiology: Pending further stroke work up Continue Evaluation:  -Admit to: ICU -Hold Aspirin until 24 hour post tPA neuroimaging is stable and without evidence  of bleeding -Blood pressure control, goal of SYS < 180 -MRI/ECHO/A1C/Lipid panel. -Hyperglycemia management per SSI to maintain glucose 140-180mg /dL. -PT/OT/ST therapies and recommendations when able  CNS Cerebral edema -Close neuro monitoring  Dysarthria Dysphagia following cerebral infarction  -NPO until cleared by speech -ST -Advance diet as tolerated  Transient hemiplegia and hemiparesis following cerebral infarction affecting left non-dominant side  -PT/OT consult as needed  RESP Maintain SpO2 > 94% -Supplemental oxygen as needed  CV Essential (primary) hypertension -Aggressive BP control, goal SBP < 180 -Titrate oral agents  Hyperlipidemia, unspecified  - Statin for goal LDL < 70  HEME AM CBC -Monitor -Transfuse for hgb < 7 -Strict bedrest 24 hours s/p TNK -Bleeding precautions  ENDO Hemoglobin A1c pending -goal HgbA1c < 7%  GI/GU -Gentle hydration  Fluid/Electrolyte Disorders AM CMP -Replete -Repeat labs -Trend  ID Trend fever and WBC curve -UA pending  Nutrition E66.9 Obesity   Prophylaxis DVT:  SCDs GI: PPI Bowel: Docusate / Senna  Diet: NPO until cleared by speech  Code Status: Full Code   THE FOLLOWING WERE PRESENT ON ADMISSION: CNS -  Acute Ischemic Stroke, Hemiplegia Cardiovascular - Hypertensive Urgency  Anibal Henderson, AGAC-NP [facility] Pager: [phone]  The patient was seen and examined with Anibal Henderson NP. The chart and diagnostic tests were reviewed. Discussed the case with neuroIR and patient was a candidate for intervention however, due to significant improvement in symptoms after tNK the procedure was not  pursued.   Electronically signed by:  Lynnae Sandhoff, MD Page: [phone] 10/23/2021, 9:16 PM

## 2021-10-23 NOTE — ED Notes (Signed)
Spoke with pharmacy concerning order for Protonix. Family informed Protonix ordered as a preventative measure to prevent stomach ulcers. Daughter verbalized understanding but does not want medication given.

## 2021-10-23 NOTE — ED Provider Notes (Signed)
[facility] EMERGENCY DEPARTMENT Provider Note   CSN: [id number] Arrival date & time: 10/23/21  1714  An emergency department physician performed an initial assessment on this suspected stroke patient at 1716.  History Chief Complaint  Patient presents with   Code Stroke    Katelyn Powers is a 87 y.o. female.  HPI     87yo female with history of hyperlipidemia, hypertension, dementia, NSTEMI on ASA and plavix, CAD, who presented as a CODE STROKE with visual changes, slurred speech followed by unresponsiveness.   Family as with her at table around 330PM when she began to state she had vision changes, her speech then became slurred and she went unresponsive. EMS arrived, called CODE STROKE with this as well as concern for left sided history on their exam.   Past Medical History:  Diagnosis Date   Cataract    Gout    Hyperlipidemia    Hypertension    Memory loss    NSTEMI (non-ST elevated myocardial infarction) ([place]) 08/2020   Right-sided carotid artery disease ([place]) 09/17/2020   carotid doppler wth >60% occlusion    Patient Active Problem List   Diagnosis Date Noted   Acute ischemic stroke ([place]) 10/23/2021   SBO (small bowel obstruction) ([place]) 09/25/2021   Late onset Alzheimer's disease without behavioral disturbance ([place]) 02/22/2021   Stenosis of intracranial vessel 02/22/2021   CVA (cerebral vascular accident) ([place]) 11/07/2020   Hypotension 09/06/2020   NSTEMI (non-ST elevated myocardial infarction) ([place]) 09/04/2020   Vitamin D deficiency 10/27/2019   Excessive sleepiness 10/27/2019   Benign essential HTN 07/27/2018   Disorder of arteries and arterioles ([place]) 07/27/2018   Hyperlipidemia 07/27/2018   Memory loss 07/27/2018   Insomnia 07/27/2018   Abnormal brain MRI 07/27/2018   Carotid stenosis, right 07/27/2018    Past Surgical History:  Procedure Laterality Date   MELANOMA EXCISION Right 10/28/1970   level 2   SUPERFICIAL LYMPH NODE BIOPSY  / EXCISION  10/28/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     Family History  Problem Relation Age of Onset   Colon cancer Sister 63   Colon polyps Brother     Social History   Tobacco Use   Smoking status: Former   Smokeless tobacco: Never  Scientific laboratory technician Use: Never used  Substance Use Topics   Alcohol use: No    Alcohol/week: 0.0 standard drinks   Drug use: No    Home Medications Prior to Admission medications   Medication Sig Start Date End Date Taking? Authorizing Provider  amLODipine (NORVASC) 5 MG tablet Take 1 tablet (5 mg total) by mouth daily. 09/07/20  Yes Sande Rives E, PA-C  aspirin 81 MG tablet Take 81 mg by mouth daily.   Yes [provider]  Cholecalciferol (VITAMIN D3 PO) Take 1 capsule by mouth daily. Vit D3 (4,000U), Vit A, K2+ 4 immune boosters- Take 1 capsule by mouth daily   Yes [provider]  clopidogrel (PLAVIX) 75 MG tablet TAKE 1 TABLET BY MOUTH  DAILY Patient taking differently: Take 75 mg by mouth daily. 07/16/21  Yes Sande Rives E, PA-C  Ginkgo Biloba 120 MG CAPS Take 120 mg by mouth in the morning.   Yes [provider]  losartan (COZAAR) 50 MG tablet Take 1 tablet (50 mg total) by mouth daily. 09/07/20  Yes Sande Rives E, PA-C  Melatonin 10  MG TABS Take 10 mg by mouth at bedtime. NEEDS NIGHTLY TO SLEEP OR WILL BE RESTLESS   Yes [provider]  nitroGLYCERIN (NITROSTAT) 0.4 MG SL tablet Place 1 tablet (0.4 mg total) under the tongue every 5 (five) minutes as needed for chest pain. 09/07/20  Yes Goodrich, Callie E, PA-C  NON FORMULARY Take 2 tablets by mouth See admin instructions. [employer] Mushroom tablets- Take 2 tablets by mouth daily with lunch   Yes [provider]  NON FORMULARY Take 2 tablets by mouth See admin instructions. Real Mushrooms Reishi Mushroom Extract tablets- Take 2 tablets by mouth daily with lunch   Yes  [provider]  NON FORMULARY Take 2 capsules by mouth See admin instructions. [employer] Daily Healthy Cholesterol Support capsules- Take 2 capsules by mouth at bedtime   Yes [provider]  Omega-3-acid Eth &Multivit/Min (OMEGA-3 RX COMPLETE PO) Take 1 capsule by mouth at bedtime.   Yes [provider]  vitamin E 1000 UNIT capsule Take 1,000 Units by mouth daily.   Yes [provider]  ondansetron (ZOFRAN ODT) 4 MG disintegrating tablet Take 1 tablet (4 mg total) by mouth every 8 (eight) hours as needed for nausea or vomiting. Patient not taking: Reported on 10/23/2021 04/09/21   Vanessa Bratenahl, MD    Allergies    Patient has no known allergies.  Review of Systems   Review of Systems  Constitutional:  Negative for fever (family denies).  Eyes:  Positive for visual disturbance.  Respiratory:  Negative for cough.   Cardiovascular:  Negative for chest pain.  Gastrointestinal:  Negative for abdominal pain.  Skin:  Negative for rash.  Neurological:  Positive for speech difficulty, weakness and headaches.   Physical Exam Updated Vital Signs BP (!) 145/115    Pulse 75    Temp 98.1 F (36.7 C) (Oral)    Resp 16    Ht 5\' 2"  (1.575 m)    Wt 75.4 kg Comment: today   SpO2 94%    BMI 30.40 kg/m   Physical Exam Vitals and nursing note reviewed.  Constitutional:      General: She is not in acute distress.    Appearance: She is well-developed. She is not diaphoretic.  HENT:     Head: Normocephalic and atraumatic.  Eyes:     Conjunctiva/sclera: Conjunctivae normal.  Cardiovascular:     Rate and Rhythm: Normal rate and regular rhythm.     Heart sounds: Normal heart sounds. No murmur heard.   No friction rub. No gallop.  Pulmonary:     Effort: Pulmonary effort is normal. No respiratory distress.     Breath sounds: Normal breath sounds. No wheezing or rales.  Abdominal:     General: There is no distension.     Palpations: Abdomen is soft.     Tenderness:  There is no abdominal tenderness. There is no guarding.  Musculoskeletal:        General: No tenderness.     Cervical back: Normal range of motion.  Skin:    General: Skin is warm and dry.     Findings: No erythema or rash.  Neurological:     Mental Status: She is alert.     GCS: GCS eye subscore is 2. GCS verbal subscore is 3. GCS motor subscore is 5.     Motor: Weakness: generalized.     Comments: Unable to participate     ED Results / Procedures / Treatments   Labs (  all labs ordered are listed, but only abnormal results are displayed) Labs Reviewed  COMPREHENSIVE METABOLIC PANEL - Abnormal; Notable for the following components:      Result Value   CO2 20 (*)    AST 14 (*)    GFR, Estimated 56 (*)    All other components within normal limits  HEMOGLOBIN A1C - Abnormal; Notable for the following components:   Hgb A1c MFr Bld 5.9 (*)    All other components within normal limits  COMPREHENSIVE METABOLIC PANEL - Abnormal; Notable for the following components:   CO2 20 (*)    Glucose, Bld 110 (*)    All other components within normal limits  URINALYSIS, COMPLETE (UACMP) WITH MICROSCOPIC - Abnormal; Notable for the following components:   Color, Urine COLORLESS (*)    Hgb urine dipstick SMALL (*)    All other components within normal limits  LIPID PANEL - Abnormal; Notable for the following components:   Cholesterol 224 (*)    Triglycerides 260 (*)    HDL 37 (*)    VLDL 52 (*)    LDL Cholesterol 135 (*)    All other components within normal limits  I-STAT CHEM 8, ED - Abnormal; Notable for the following components:   BUN 29 (*)    Calcium, Ion 1.08 (*)    All other components within normal limits  MRSA NEXT GEN BY PCR, NASAL  PROTIME-INR  CBC  DIFFERENTIAL  CBC  RAPID URINE DRUG SCREEN, HOSP PERFORMED  ETHANOL  PROTIME-INR  APTT  CBG MONITORING, ED    EKG EKG Interpretation  Date/Time:  Tuesday October 23 2021 17:54:14 EST Ventricular Rate:  72 PR  Interval:  159 QRS Duration: 95 QT Interval:  404 QTC Calculation: 443 R Axis:   75 Text Interpretation: Sinus rhythm Low voltage, precordial leads When compared with ECG of 09/26/2021, No significant change was found Confirmed by Delora Fuel ([id number]) on 10/23/2021 11:15:10 PM  Radiology CT HEAD CODE STROKE WO CONTRAST  Result Date: 10/23/2021 CLINICAL DATA:  Code stroke. Left-sided facial droop, left-sided weakness EXAM: CT HEAD WITHOUT CONTRAST TECHNIQUE: Contiguous axial images were obtained from the base of the skull through the vertex without intravenous contrast. COMPARISON:  04/09/2021. FINDINGS: Brain: No definite acute infarct. No acute hemorrhage, mass, mass effect, or midline shift. Hypodensity in the right cerebellum, likely sequela of remote infarct, although this is new compared to April 09, 2021. Redemonstrated remote left parietal and right frontal subcortical white matter infarct. Periventricular white matter changes, likely the sequela of chronic small vessel ischemic disease. Slightly disproportionate atrophy in the medial left temporal lobe, unchanged. Vascular: No hyperdense vessel. Atherosclerotic calcifications in the intracranial carotid and vertebral arteries. Skull: Normal. Negative for fracture or focal lesion. Sinuses/Orbits: No acute finding. Other: The mastoids are well aerated. ASPECTS [facility] Stroke Program Early CT Score) - Ganglionic level infarction (caudate, lentiform nuclei, internal capsule, insula, M1-M3 cortex): 7 - Supraganglionic infarction (M4-M6 cortex): 3 Total score (0-10 with 10 being normal): 10 IMPRESSION: 1. Right cerebellar infarct, of indeterminate acuity, new compared to 04/09/2021, but favored to be remote. No other acute intracranial process. 2. ASPECTS is 10 Code stroke imaging results were communicated on 10/23/2021 at 5:38 pm to provider [facility] via secure text paging. Electronically Signed   By: Merilyn Baba M.D.   On: 10/23/2021 17:38   CT  ANGIO HEAD NECK W WO CM (CODE STROKE)  Result Date: 10/23/2021 CLINICAL DATA:  Neuro deficit, left-sided facial droop, left-sided weakness, slurred  speech EXAM: CT ANGIOGRAPHY HEAD AND NECK TECHNIQUE: Multidetector CT imaging of the head and neck was performed using the standard protocol during bolus administration of intravenous contrast. Multiplanar CT image reconstructions and MIPs were obtained to evaluate the vascular anatomy. Carotid stenosis measurements (when applicable) are obtained utilizing NASCET criteria, using the distal internal carotid diameter as the denominator. CONTRAST:  40mL OMNIPAQUE IOHEXOL 350 MG/ML SOLN COMPARISON:  11/07/2020 MRA head, no prior CTA, correlation is also made with same day CT head. FINDINGS: CT HEAD FINDINGS For noncontrast findings, please see same day CT head. CTA NECK FINDINGS Aortic arch: Standard branching. Imaged portion shows no evidence of aneurysm or dissection. Aortic atherosclerosis, which extends into the branch vessel origins and causes 50-60% narrowing in the proximal left subclavian artery. The subclavian is otherwise patent. No significant stenosis of the other major arch vessel origins. Right carotid system: 65-70% stenosis of the proximal right ICA, just distal to the bifurcation, secondary to severe calcified and noncalcified plaque. No evidence of dissection. Left carotid system: No evidence of dissection, stenosis (50% or greater) or occlusion. Vertebral arteries: Diminutive right vertebral artery. Left dominant. No evidence of dissection, stenosis (50% or greater) or occlusion. Skeleton: Osteopenia.  No acute osseous abnormality. Other neck: Negative Upper chest: Negative Review of the MIP images confirms the above findings CTA HEAD FINDINGS Anterior circulation: Both internal carotid arteries are patent to the termini, without stenosis or other abnormality. Focal stenosis at the origin of the right A1 (series 7, image 104). Normal left A1. Normal  anterior communicating artery. Anterior cerebral arteries are patent to their distal aspects. Severe focal stenosis in the proximal right M1 (series 7, image 105. Normal left M1. Normal MCA bifurcations. Distal MCA branches perfused, although there is slightly less opacification on the right compared to the left. Posterior circulation: Diminutive right vertebral artery. Vertebral arteries patent to the vertebrobasilar junction without stenosis. Posterior inferior cerebral arteries patent bilaterally. Basilar patent to its distal aspect. Superior cerebellar arteries patent bilaterally. Bilateral P1 segments originate from the basilar artery. Severe focal stenosis/non opacification in the left P1, although there is a patent left posterior communicating artery. Normal right P1. No right posterior communicating artery. Severe stenosis is also noted in the proximal left P2, with multifocal narrowing in the distal left P2. The right PCA is perfused to its distal aspects without stenosis. Venous sinuses: Minimally opacified. As permitted by contrast timing, patent. Anatomic variants: None significant Review of the MIP images confirms the above findings IMPRESSION: 1. Focal non opacification of the left P1. Severe focal stenosis in the proximal right M1, right A1, and left P2. 2. 65-70% stenosis in the proximal right ICA, secondary to calcified and noncalcified plaque. No other hemodynamically significant stenosis in the neck. 3. 50-60% stenosis in the proximal left subclavian artery, secondary to calcified plaque. Electronically Signed   By: Merilyn Baba M.D.   On: 10/23/2021 18:15    Procedures .Critical Care Performed by: Gareth Morgan, MD Authorized by: Gareth Morgan, MD   Critical care provider statement:    Critical care time (minutes):  30   Critical care was time spent personally by me on the following activities:  Development of treatment plan with patient or surrogate, discussions with consultants,  evaluation of patient's response to treatment, examination of patient, ordering and review of laboratory studies, ordering and review of radiographic studies, ordering and performing treatments and interventions, pulse oximetry, re-evaluation of patient's condition and review of old charts   Medications Ordered in  ED Medications  sodium chloride flush (NS) 0.9 % injection 3 mL (3 mLs Intravenous Not Given 10/23/21 1904)   stroke: mapping our early stages of recovery book (has no administration in time range)  0.9 %  sodium chloride infusion ( Intravenous Stopped 10/24/21 1155)  acetaminophen (TYLENOL) tablet 650 mg (has no administration in time range)    Or  acetaminophen (TYLENOL) 160 MG/5ML solution 650 mg (has no administration in time range)    Or  acetaminophen (TYLENOL) suppository 650 mg (has no administration in time range)  senna-docusate (Senokot-S) tablet 1 tablet (has no administration in time range)  ticagrelor (BRILINTA) 90 MG tablet (0 mg  Hold 10/23/21 1908)  Chlorhexidine Gluconate Cloth 2 % PADS 6 each (6 each Topical Given 10/23/21 2330)  pantoprazole (PROTONIX) EC tablet 40 mg (has no administration in time range)  iohexol (OMNIPAQUE) 350 MG/ML injection 75 mL (75 mLs Intravenous Contrast Given 10/23/21 1737)  tenecteplase (TNKASE) injection for Stroke 19 mg (19 mg Intravenous Given 10/23/21 1801)  naloxone (NARCAN) injection 0.4 mg (0.4 mg Intravenous Given 10/23/21 1747)    ED Course  I have reviewed the triage vital signs and the nursing notes.  Pertinent labs & imaging results that were available during my care of the patient were reviewed by me and considered in my medical decision making (see chart for details).    MDM Rules/Calculators/A&P                          87yo female with history of hyperlipidemia, hypertension, dementia, NSTEMI on ASA and plavix, CAD, who presented as a CODE STROKE with visual changes, slurred speech followed by unresponsiveness.    Arrives to the ED very sleepy, CT head completed without ICH. Small pupils, episodes of central and obstructive apnea. Given narcan. Neurology reviewed CTA, noted focal non opacification left P1, TNK given.  Shortly after these interventions she is more alert, participating in exam, no airway concerns.  Family denies her taking medications other than those prescribed.  Presentation most consistent with left PCA stroke. Admitted for further care.      Final Clinical Impression(s) / ED Diagnoses Final diagnoses:  Cerebral infarction due to embolism of left posterior cerebral artery [facility])    Rx / DC Orders ED Discharge Orders     None        Gareth Morgan, MD 10/24/21 1529

## 2021-10-23 NOTE — ED Notes (Signed)
Recollect blue tube for lab. Sample for PT INR.

## 2021-10-23 NOTE — Anesthesia Preprocedure Evaluation (Deleted)
Anesthesia Evaluation    Airway        Dental   Pulmonary neg pulmonary ROS, former smoker,           Cardiovascular hypertension, Pt. on medications + Past MI (11/21)       Neuro/Psych Dementia CODE STROKE CVA    GI/Hepatic negative GI ROS, Neg liver ROS,   Endo/Other  negative endocrine ROS  Renal/GU negative Renal ROS  negative genitourinary   Musculoskeletal negative musculoskeletal ROS (+)   Abdominal   Peds  Hematology negative hematology ROS (+)   Anesthesia Other Findings   Reproductive/Obstetrics                             Anesthesia Physical Anesthesia Plan  ASA: 4 and emergent  Anesthesia Plan: General   Post-op Pain Management:    Induction: Intravenous and Rapid sequence  PONV Risk Score and Plan: 3 and Ondansetron and Treatment may vary due to age or medical condition  Airway Management Planned: Mask and Oral ETT  Additional Equipment: Arterial line  Intra-op Plan:   Post-operative Plan: Possible Post-op intubation/ventilation  Informed Consent:   Plan Discussed with: CRNA  Anesthesia Plan Comments: (Lab Results      Component                Value               Date                      WBC                      8.2                 10/23/2021                HGB                      14.3                10/23/2021                HCT                      42.0                10/23/2021                MCV                      92.6                10/23/2021                PLT                      168                 10/23/2021           Lab Results      Component                Value               Date  NA                       140                 10/23/2021                K                        4.3                 10/23/2021                CO2                      20 (L)              10/23/2021                GLUCOSE                  91                   10/23/2021                BUN                      29 (H)              10/23/2021                CREATININE               1.00                10/23/2021                CALCIUM                  9.4                 10/23/2021                GFRNONAA                 56 (L)              10/23/2021          ECHO 11/22: 1. Left ventricular ejection fraction, by estimation, is 55 to 60%. The  left ventricle has normal function. The left ventricle has no regional  wall motion abnormalities. Impaired relaxation with elevated left atrial  pressure.  2. Right ventricular systolic function is normal. The right ventricular  size is normal.  3. The mitral valve is normal in structure. Mild to moderate mitral valve  regurgitation. No evidence of mitral stenosis.  4. The aortic valve is calcified. Aortic valve regurgitation is mild.  Paradoxical moderate low flow low gradient aortic stenosis. AVA by VTI  0.87 cm2, Aortic valve mean gradient measures 11.0 mmHg. Aortic valve Vmax  measures 2.14 m/s, DI 0.34, SVI 26.  5. The inferior vena cava is normal in size with greater than 50%  respiratory variability, suggesting right atrial pressure of 3 mmHg.   )       Anesthesia Quick Evaluation

## 2021-10-23 NOTE — Code Documentation (Signed)
Stroke Response Nurse Documentation Code Documentation  Katelyn Powers is a 87 y.o. female arriving to [facility] ED via [facility] on 10/23/2021 with past medical hx of cataracts, HLD, HTN, NSTEMI, R-carotid artery disease, Alzheimer. On aspirin 81 mg daily and clopidogrel 75 mg daily. Code stroke was activated by EMS.   Patient from home where she was LKW at 1621 and now complaining of slurred speech, left facial droop and left sided weakness. Pt lives with her daughter. Pt was sitting on the couch when her daughter witnessed pt have sudden onset of vision changes, left sided facial droop, and left sided weakness.   Stroke team at the bedside on patient arrival. Labs drawn and patient cleared for CT by Dr. Jerrol Banana. Patient to CT with team. NIHSS 21, see documentation for details and code stroke times. Patient with decreased LOC, disoriented, not following commands, left facial droop, global aphasia, and dysarthria  on exam. The following imaging was completed: CT, CTA head and neck. Patient is a candidate for IV Thrombolytic. Patient is not a candidate for IR due to symptoms resolving, family refusing.   Care/Plan: NIHSS and VS q15 minutes.   Bedside handoff with ED RN Kathlee Nations.    Jisela Merlino L Sheba Whaling  Rapid Response RN

## 2021-10-24 ENCOUNTER — Inpatient Hospital Stay (HOSPITAL_COMMUNITY): Payer: Medicare Other

## 2021-10-24 DIAGNOSIS — I6521 Occlusion and stenosis of right carotid artery: Secondary | ICD-10-CM

## 2021-10-24 DIAGNOSIS — I669 Occlusion and stenosis of unspecified cerebral artery: Secondary | ICD-10-CM

## 2021-10-24 DIAGNOSIS — Z8673 Personal history of transient ischemic attack (TIA), and cerebral infarction without residual deficits: Secondary | ICD-10-CM

## 2021-10-24 DIAGNOSIS — Z9282 Status post administration of tPA (rtPA) in a different facility within the last 24 hours prior to admission to current facility: Secondary | ICD-10-CM

## 2021-10-24 LAB — HEMOGLOBIN A1C
Hgb A1c MFr Bld: 5.9 % — ABNORMAL HIGH (ref 4.8–5.6)
Mean Plasma Glucose: 122.63 mg/dL

## 2021-10-24 LAB — CBC
HCT: 40.2 % (ref 36.0–46.0)
Hemoglobin: 13.1 g/dL (ref 12.0–15.0)
MCH: 29.3 pg (ref 26.0–34.0)
MCHC: 32.6 g/dL (ref 30.0–36.0)
MCV: 89.9 fL (ref 80.0–100.0)
Platelets: 178 10*3/uL (ref 150–400)
RBC: 4.47 MIL/uL (ref 3.87–5.11)
RDW: 13.6 % (ref 11.5–15.5)
WBC: 8.7 10*3/uL (ref 4.0–10.5)
nRBC: 0 % (ref 0.0–0.2)

## 2021-10-24 LAB — COMPREHENSIVE METABOLIC PANEL
ALT: 11 U/L (ref 0–44)
AST: 17 U/L (ref 15–41)
Albumin: 3.6 g/dL (ref 3.5–5.0)
Alkaline Phosphatase: 73 U/L (ref 38–126)
Anion gap: 12 (ref 5–15)
BUN: 18 mg/dL (ref 8–23)
CO2: 20 mmol/L — ABNORMAL LOW (ref 22–32)
Calcium: 9.2 mg/dL (ref 8.9–10.3)
Chloride: 109 mmol/L (ref 98–111)
Creatinine, Ser: 0.91 mg/dL (ref 0.44–1.00)
GFR, Estimated: >60 mL/min (ref >60)
Glucose, Bld: 110 mg/dL — ABNORMAL HIGH (ref 70–99)
Potassium: 3.8 mmol/L (ref 3.5–5.1)
Sodium: 141 mmol/L (ref 135–145)
Total Bilirubin: 1 mg/dL (ref 0.3–1.2)
Total Protein: 6.9 g/dL (ref 6.5–8.1)

## 2021-10-24 LAB — RESP PANEL BY RT-PCR (FLU A&B, COVID) ARPGX2
Influenza A by PCR: NEGATIVE
Influenza B by PCR: NEGATIVE
SARS Coronavirus 2 by RT PCR: NEGATIVE

## 2021-10-24 LAB — LIPID PANEL
Cholesterol: 224 mg/dL — ABNORMAL HIGH (ref 0–200)
HDL: 37 mg/dL — ABNORMAL LOW (ref >40)
LDL Cholesterol: 135 mg/dL — ABNORMAL HIGH (ref 0–99)
Total CHOL/HDL Ratio: 6.1 RATIO
Triglycerides: 260 mg/dL — ABNORMAL HIGH (ref <150)
VLDL: 52 mg/dL — ABNORMAL HIGH (ref 0–40)

## 2021-10-24 LAB — ETHANOL: Alcohol, Ethyl (B): <10 mg/dL (ref <10)

## 2021-10-24 LAB — MRSA NEXT GEN BY PCR, NASAL: MRSA by PCR Next Gen: NOT DETECTED

## 2021-10-24 MED ORDER — ASPIRIN EC 81 MG PO TBEC
81.0000 mg | DELAYED_RELEASE_TABLET | Freq: Every day | ORAL | Status: DC
  Administered 2021-10-25: 10:00:00 81 mg via ORAL
  Filled 2021-10-24: qty 1

## 2021-10-24 MED ORDER — CLOPIDOGREL BISULFATE 75 MG PO TABS
75.0000 mg | ORAL_TABLET | Freq: Every day | ORAL | Status: DC
  Administered 2021-10-25: 10:00:00 75 mg via ORAL
  Filled 2021-10-24: qty 1

## 2021-10-24 MED ORDER — MELATONIN 5 MG PO TABS
10.0000 mg | ORAL_TABLET | Freq: Every day | ORAL | Status: DC
  Administered 2021-10-24: 21:00:00 10 mg via ORAL
  Filled 2021-10-24: qty 2

## 2021-10-24 MED ORDER — ATORVASTATIN CALCIUM 40 MG PO TABS
40.0000 mg | ORAL_TABLET | Freq: Every day | ORAL | Status: DC
  Administered 2021-10-24 – 2021-10-25 (×2): 40 mg via ORAL
  Filled 2021-10-24 (×2): qty 1

## 2021-10-24 MED ORDER — PANTOPRAZOLE SODIUM 40 MG PO TBEC
40.0000 mg | DELAYED_RELEASE_TABLET | Freq: Every day | ORAL | Status: DC
  Administered 2021-10-24: 21:00:00 40 mg via ORAL
  Filled 2021-10-24: qty 1

## 2021-10-24 NOTE — Progress Notes (Signed)
PT Cancellation Note  Patient Details Name: Katelyn Powers MRN: [id number] DOB: January 26, 1932   Cancelled Treatment:    Reason Eval/Treat Not Completed: Active bedrest order Will evaluate once activity orders are updated.   Hooper Petteway A. Gilford Rile PT, DPT Acute Rehabilitation Services Pager [phone] Office [phone]    Linna Hoff 10/24/2021, 8:09 AM

## 2021-10-24 NOTE — Telephone Encounter (Signed)
Daughter of the patient called. She was calling back to talk to [facility] in regards to the patient's test results

## 2021-10-24 NOTE — Progress Notes (Signed)
MD made aware of completed MRI and results and notified to start Asprin. New orders for Asprin and Plavix ordered and will start in the morning.

## 2021-10-24 NOTE — Progress Notes (Addendum)
STROKE TEAM PROGRESS NOTE   ATTENDING NOTE: I reviewed above note and agree with the assessment and plan. Pt was seen and examined.   87 year old female with history of hypertension, hyperlipidemia, dementia, non-STEMI on aspirin and Plavix, carotid artery disease admitted for episode of slurred speech, vision changes, altered mental status and left-sided weakness.  In ER, patient still has slurred speech and altered mental status but left-sided weakness resolved.  CT showed old right cerebellar infarct but new since 03/2021 and old left parietal infarct.  Status post TN K.  CTA head and neck showed right A1, right M1, left P1 and P2 high-grade stenosis, right ICA bulb at least 65 to 70% stenosis, left subclavian artery 50 to 60% stenosis.  MRI showed no acute infarct.  Carotid Doppler 10/02/2021 showed right ICA 40 to 59% stenosis.  EF 55 to 60% on 09/26/2021.  LDL 135, A1c 5.9.  UDS negative.  Creatinine 1.00.  On exam, patient daughter at bedside, patient sitting in chair, awake alert, however orientated to people and self but not to place or time.  Able to name but not corporative with repeating.  Paucity of speech, able to follow simple commands.  Otherwise no focal neurologic deficit.  Etiology for patient symptoms likely related to multifocal extracranial and intracranial stenosis.  Especially, with patient transient left-sided weakness and right carotid stenosis, concerning for symptomatic right carotid stenosis.  However, patient underwent drainage with dementia and the family prefer conservative management, may not be candidate for right ICA stenting.  We will double check with family and Dr. Norma Fredrickson interventional radiologist.  Will continue aspirin and Plavix for now, may consider Brilinta if family would like to proceed.  Add Lipitor 40 after discussed with daughter. PT/OT recommend home PT/OT.  For detailed assessment and plan, please refer to above as I have made changes wherever  appropriate.   Katelyn Powers, Katelyn Powers Stroke Neurology 10/24/2021 9:20 PM  This patient is critically ill due to stroke episode, multifocal intracranial and extracranial stenosis, likely symptomatic right ICA stenosis, status post TN K and at significant risk of neurological worsening, death form bleeding from TN K, recurrent stroke/TIA. This patient's care requires constant monitoring of vital signs, hemodynamics, respiratory and cardiac monitoring, review of multiple databases, neurological assessment, discussion with family, other specialists and medical decision making of high complexity. I spent 40 minutes of neurocritical care time in the care of this patient. I had long discussion with daughter at bedside, updated pt current condition, treatment plan and potential prognosis, and answered all the questions.  She expressed understanding and appreciation.       INTERVAL HISTORY Patient is seen in her room with her daughter at the bedside.  She was admitted yesterday with slurred speech, left sided weakness and vision changes.  TNK was given on arrival, and patient's symptoms rapidly improved.  Thrombectomy was not performed due to improvement of symptoms.  Patient has been hemodynamically stable, and her neurological exam is stable overnight.  Vitals:   10/24/21 1200 10/24/21 1300 10/24/21 1400 10/24/21 1500  BP: (!) 146/64 138/74 134/73 (!) 145/115  Pulse: 76 77 75 75  Resp: 14 13 14 16   Temp: 98.1 F (36.7 C)     TempSrc: Oral     SpO2: 95% 94% 95% 94%  Weight:      Height:       CBC:  Recent Labs  Lab 10/23/21 1735 10/23/21 1744 10/24/21 0402  WBC 8.2  --  8.7  NEUTROABS 5.6  --   --  HGB 13.8 14.3 13.1  HCT 42.4 42.0 40.2  MCV 92.6  --  89.9  PLT 168  --  503   Basic Metabolic Panel:  Recent Labs  Lab 10/23/21 1735 10/23/21 1744 10/24/21 0402  NA 139 140 141  K 4.3 4.3 3.8  CL 107 110 109  CO2 20*  --  20*  GLUCOSE 93 91 110*  BUN 23 29* 18  CREATININE 0.98  1.00 0.91  CALCIUM 9.4  --  9.2   Lipid Panel:  Recent Labs  Lab 10/24/21 0402  CHOL 224*  TRIG 260*  HDL 37*  CHOLHDL 6.1  VLDL 52*  LDLCALC 135*   HgbA1c:  Recent Labs  Lab 10/24/21 0402  HGBA1C 5.9*   Urine Drug Screen:  Recent Labs  Lab 10/23/21 1717  LABOPIA NONE DETECTED  COCAINSCRNUR NONE DETECTED  LABBENZ NONE DETECTED  AMPHETMU NONE DETECTED  THCU NONE DETECTED  LABBARB NONE DETECTED    Alcohol Level  Recent Labs  Lab 10/24/21 0402  ETH <10    IMAGING past 24 hours CT HEAD CODE STROKE WO CONTRAST  Result Date: 10/23/2021 CLINICAL DATA:  Code stroke. Left-sided facial droop, left-sided weakness EXAM: CT HEAD WITHOUT CONTRAST TECHNIQUE: Contiguous axial images were obtained from the base of the skull through the vertex without intravenous contrast. COMPARISON:  04/09/2021. FINDINGS: Brain: No definite acute infarct. No acute hemorrhage, mass, mass effect, or midline shift. Hypodensity in the right cerebellum, likely sequela of remote infarct, although this is new compared to April 09, 2021. Redemonstrated remote left parietal and right frontal subcortical white matter infarct. Periventricular white matter changes, likely the sequela of chronic small vessel ischemic disease. Slightly disproportionate atrophy in the medial left temporal lobe, unchanged. Vascular: No hyperdense vessel. Atherosclerotic calcifications in the intracranial carotid and vertebral arteries. Skull: Normal. Negative for fracture or focal lesion. Sinuses/Orbits: No acute finding. Other: The mastoids are well aerated. ASPECTS [facility] Stroke Program Early CT Score) - Ganglionic level infarction (caudate, lentiform nuclei, internal capsule, insula, M1-M3 cortex): 7 - Supraganglionic infarction (M4-M6 cortex): 3 Total score (0-10 with 10 being normal): 10 IMPRESSION: 1. Right cerebellar infarct, of indeterminate acuity, new compared to 04/09/2021, but favored to be remote. No other acute intracranial  process. 2. ASPECTS is 10 Code stroke imaging results were communicated on 10/23/2021 at 5:38 pm to provider [facility] via secure text paging. Electronically Signed   By: Merilyn Baba M.D.   On: 10/23/2021 17:38   CT ANGIO HEAD NECK W WO CM (CODE STROKE)  Result Date: 10/23/2021 CLINICAL DATA:  Neuro deficit, left-sided facial droop, left-sided weakness, slurred speech EXAM: CT ANGIOGRAPHY HEAD AND NECK TECHNIQUE: Multidetector CT imaging of the head and neck was performed using the standard protocol during bolus administration of intravenous contrast. Multiplanar CT image reconstructions and MIPs were obtained to evaluate the vascular anatomy. Carotid stenosis measurements (when applicable) are obtained utilizing NASCET criteria, using the distal internal carotid diameter as the denominator. CONTRAST:  39mL OMNIPAQUE IOHEXOL 350 MG/ML SOLN COMPARISON:  11/07/2020 MRA head, no prior CTA, correlation is also made with same day CT head. FINDINGS: CT HEAD FINDINGS For noncontrast findings, please see same day CT head. CTA NECK FINDINGS Aortic arch: Standard branching. Imaged portion shows no evidence of aneurysm or dissection. Aortic atherosclerosis, which extends into the branch vessel origins and causes 50-60% narrowing in the proximal left subclavian artery. The subclavian is otherwise patent. No significant stenosis of the other major arch vessel origins. Right carotid system: 65-70%  stenosis of the proximal right ICA, just distal to the bifurcation, secondary to severe calcified and noncalcified plaque. No evidence of dissection. Left carotid system: No evidence of dissection, stenosis (50% or greater) or occlusion. Vertebral arteries: Diminutive right vertebral artery. Left dominant. No evidence of dissection, stenosis (50% or greater) or occlusion. Skeleton: Osteopenia.  No acute osseous abnormality. Other neck: Negative Upper chest: Negative Review of the MIP images confirms the above findings CTA HEAD  FINDINGS Anterior circulation: Both internal carotid arteries are patent to the termini, without stenosis or other abnormality. Focal stenosis at the origin of the right A1 (series 7, image 104). Normal left A1. Normal anterior communicating artery. Anterior cerebral arteries are patent to their distal aspects. Severe focal stenosis in the proximal right M1 (series 7, image 105. Normal left M1. Normal MCA bifurcations. Distal MCA branches perfused, although there is slightly less opacification on the right compared to the left. Posterior circulation: Diminutive right vertebral artery. Vertebral arteries patent to the vertebrobasilar junction without stenosis. Posterior inferior cerebral arteries patent bilaterally. Basilar patent to its distal aspect. Superior cerebellar arteries patent bilaterally. Bilateral P1 segments originate from the basilar artery. Severe focal stenosis/non opacification in the left P1, although there is a patent left posterior communicating artery. Normal right P1. No right posterior communicating artery. Severe stenosis is also noted in the proximal left P2, with multifocal narrowing in the distal left P2. The right PCA is perfused to its distal aspects without stenosis. Venous sinuses: Minimally opacified. As permitted by contrast timing, patent. Anatomic variants: None significant Review of the MIP images confirms the above findings IMPRESSION: 1. Focal non opacification of the left P1. Severe focal stenosis in the proximal right M1, right A1, and left P2. 2. 65-70% stenosis in the proximal right ICA, secondary to calcified and noncalcified plaque. No other hemodynamically significant stenosis in the neck. 3. 50-60% stenosis in the proximal left subclavian artery, secondary to calcified plaque. Electronically Signed   By: Merilyn Baba M.D.   On: 10/23/2021 18:15    PHYSICAL EXAM General:  Alert, well developed, well nourished patient in no acute distress   NEURO:  Mental Status:  AA&Ox1 Speech/Language: speech is without dysarthria or aphasia.  Naming, repetition, fluency, and comprehension intact.  Cranial Nerves:  II: PERRL. Visual fields full.  III, IV, VI: EOMI. Eyelids elevate symmetrically.  V: Sensation is intact to light touch and symmetrical to face.  VII: Smile is symmetrical.  VIII: hearing intact to voice. IX, X: Phonation is normal.  XII: tongue is midline without fasciculations. Motor: 5/5 strength to all muscle groups tested.  Tone: is normal and bulk is normal Sensation- Intact to light touch bilaterally. Extinction absent to light touch to DSS.   Coordination: FTN intact bilaterally.No drift.  Gait- deferred   ASSESSMENT/PLAN Katelyn Powers is a 87 y.o. female with history of HTN, HLD, dementia and NSTEMI presenting with slurred speech, left sided weakness and vision changes.  TNK was given on arrival to the ED, and patient's symptoms rapidly improved.  Thrombectomy was not performed due to improvement of symptoms.  TIA - likely due to multifocal extracranial and intracranial stenosis Code Stroke CT head Remote right cerebellar infarct ASPECTS 10.    CTA head & neck Focal non opacification of left P1, severe focal stenosis in right M1, right A1 and left P2, 65-70% stenosis in proximal right ICA MRI  pending Carotis doppler right ICA 40-59% stenosis, left ICA 1-39% stenosis 2D Echo EF 55-60%, no  atrial level shunt LDL 135 HgbA1c 5.9 VTE prophylaxis - SCDs aspirin 81 mg daily and clopidogrel 75 mg daily prior to admission, now on No antithrombotic secondary to recent TNK administration Therapy recommendations:  pending Disposition:  pending  Hypertension Home meds:  losartan 50 mg daily Stable Keep BP <180/105 Long-term BP goal normotensive  Hyperlipidemia Home meds:  none LDL 135, goal < 70 Add atorvastatin 40 mg daily  Continue statin at discharge   Other Stroke Risk Factors Advanced Age >/= 46  Former cigarette  smoker Obesity, Body mass index is 30.4 kg/m., BMI >/= 30 associated with increased stroke risk, recommend weight loss, diet and exercise as appropriate  Coronary artery disease  Other Active Problems Dementia Reorient PRN Delirium precautions  Hospital day # [place] , MSN, AGACNP-BC [facility] See Amion for schedule and pager information 10/24/2021 4:32 PM    To contact Stroke Continuity provider, please refer to [email or web address] After hours, contact General Neurology

## 2021-10-24 NOTE — Progress Notes (Signed)
°  Transition of Care [facility]) Screening Note   Patient Details  Name: Katelyn Powers Date of Birth: [birth year]   Transition of Care [facility]) CM/SW Contact:    Benard Halsted, LCSW Phone Number: 10/24/2021, 9:32 AM    Transition of Care Department [facility]) has reviewed patient and no TOC needs have been identified at this time. We will continue to monitor patient advancement through interdisciplinary progression rounds. If new patient transition needs arise, please place a TOC consult.

## 2021-10-24 NOTE — Telephone Encounter (Signed)
Spoke with daughter, Lynelle Smoke, and reviewed carotid results.  Daughter appreciative for call.

## 2021-10-24 NOTE — Progress Notes (Signed)
Pt transferred from 4N ICU, accompanied by family member, alert to self only but follows simple command, denies any pain at this time, settled in bed with call light at bedside, tele monitor put and verified o n pt, safety concern explained and initiated as well, pt and family reassured and will continue to monitor, v/s stable. [place], Ambry Dix Efe

## 2021-10-24 NOTE — Evaluation (Signed)
Occupational Therapy Evaluation Patient Details Name: Katelyn Powers MRN: [id number] DOB: August 14, 1932 Today's Date: 10/24/2021   History of Present Illness 87 y/o female presented to ED on 12/27 for slurred speech, vision changes, and became unresponsive. TNK given. CT head showed R cerebellar infarct. PMH: HTN, dementia, NSTEMI, CAD   Clinical Impression   PT admitted with L side weakness s/p TKN. Pt currently with functional limitiations due to the deficits listed below (see OT problem list). Pt currently with posterior bias and needs min (A) with transfer. Pt progressing well this session. Daughter present and agreeable to d/c home with family (A).  Pt will benefit from skilled OT to increase their independence and safety with adls and balance to allow discharge [place].       Recommendations for follow up therapy are one component of a multi-disciplinary discharge planning process, led by the attending physician.  Recommendations may be updated based on patient status, additional functional criteria and insurance authorization.   Follow Up Recommendations  Home health OT    Assistance Recommended at Discharge Intermittent Supervision/Assistance  Functional Status Assessment  Patient has had a recent decline in their functional status and demonstrates the ability to make significant improvements in function in a reasonable and predictable amount of time.  Equipment Recommendations  None recommended by OT    Recommendations for Other Services       Precautions / Restrictions Precautions Precautions: Fall Precaution Comments: SBP <180, mittens Restrictions Weight Bearing Restrictions: No      Mobility Bed Mobility Overal bed mobility: Needs Assistance Bed Mobility: Supine to Sit     Supine to sit: Min guard          Transfers Overall transfer level: Needs assistance Equipment used: None Transfers: Sit to/from Stand Sit to Stand: Min assist           General  transfer comment: minA to steady due to patient initially leaning posteriorly. Requiring momentum to achieve upright. Using bed/chair to brace self to avoid posterior LOB      Balance Overall balance assessment: Needs assistance Sitting-balance support: No upper extremity supported;Feet supported Sitting balance-Leahy Scale: Good     Standing balance support: No upper extremity supported;During functional activity Standing balance-Leahy Scale: Poor Standing balance comment: minA for balance                           ADL either performed or assessed with clinical judgement   ADL Overall ADL's : Needs assistance/impaired Eating/Feeding: Supervision/ safety   Grooming: Set up   Upper Body Bathing: Set up   Lower Body Bathing: Moderate assistance   Upper Body Dressing : Minimal assistance   Lower Body Dressing: Moderate assistance Lower Body Dressing Details (indicate cue type and reason): don sock and pants Toilet Transfer: Minimal assistance   Toileting- Clothing Manipulation and Hygiene: Minimal assistance       Functional mobility during ADLs: Minimal assistance General ADL Comments: pt needs cues throughout session not to pull things off. pt very fixated on having clothing on that is "hers" and asking for shoes. Daughter requesting sister bring shoes for patient. Pt allowed home pants at this time but not top due to iv lines.     Vision   Additional Comments: scanning appropriately. does not appear to have any changes from baseline     Perception     Praxis      Pertinent Vitals/Pain Pain Assessment: No/denies pain  Hand Dominance Right   Extremity/Trunk Assessment Upper Extremity Assessment Upper Extremity Assessment: Overall WFL for tasks assessed   Lower Extremity Assessment Lower Extremity Assessment: Defer to PT evaluation   Cervical / Trunk Assessment Cervical / Trunk Assessment: Normal   Communication Communication Communication:  No difficulties   Cognition Arousal/Alertness: Awake/alert Behavior During Therapy: Restless Overall Cognitive Status: History of cognitive impairments - at baseline                                 General Comments: hx of dementia. Following commands with increased time and repetition     General Comments  posterior bias with balance. VSS    Exercises     Shoulder Instructions      Home Living Family/patient expects to be discharged to:: Private residence Living Arrangements: Children Available Help at Discharge: Family;Available 24 hours/day Type of Home: House Home Access: Stairs to enter [employer] of Steps: 5   Home Layout: One level     Bathroom Shower/Tub: Sponge bathes at baseline   Bathroom Toilet: Standard Bathroom Accessibility: Yes   Home Equipment: Conservation officer, nature (2 wheels);Cane - single point;Shower seat   Additional Comments: daughters alternate between homes every week. At most, 5 stairs to enter a home (number obtained from previous admission). Pt home, daughter home and second daughter home.      Prior Functioning/Environment Prior Level of Function : Independent/Modified Independent             Mobility Comments: ambulates with no AD. family allows patient to be as independentas possible ADLs Comments: sponges bathes at baseline        OT Problem List: Impaired balance (sitting and/or standing);Decreased cognition      OT Treatment/Interventions: Self-care/ADL training;Therapeutic exercise;Energy conservation;DME and/or AE instruction;Therapeutic activities;Patient/family education;Balance training;Cognitive remediation/compensation    OT Goals(Current goals can be found in the care plan section) Acute Rehab OT Goals Patient Stated Goal: to get my clothes on OT Goal Formulation: With patient/family Time For Goal Achievement: 11/07/21 Potential to Achieve Goals: Good  OT Frequency: Min 2X/week   Barriers to  D/C:            Co-evaluation              AM-PAC OT "6 Clicks" Daily Activity     Outcome Measure Help from another person eating meals?: None Help from another person taking care of personal grooming?: A Little Help from another person toileting, which includes using toliet, bedpan, or urinal?: A Little Help from another person bathing (including washing, rinsing, drying)?: A Little Help from another person to put on and taking off regular upper body clothing?: A Little Help from another person to put on and taking off regular lower body clothing?: A Lot 6 Click Score: 18   End of Session Equipment Utilized During Treatment: Gait belt Nurse Communication: Mobility status;Precautions  Activity Tolerance: Patient tolerated treatment well Patient left: in chair;with call bell/phone within reach;with chair alarm set;with family/visitor present  OT Visit Diagnosis: Unsteadiness on feet (R26.81)                Time: 6948-5462 OT Time Calculation (min): 23 min Charges:  OT General Charges $OT Visit: 1 Visit OT Evaluation $OT Eval Moderate Complexity: 1 Mod   Brynn, OTR/L  Acute Rehabilitation Services Pager: [phone] Office: [phone] .   Jeri Modena 10/24/2021, 10:38 AM

## 2021-10-24 NOTE — Evaluation (Signed)
Speech Language Pathology Evaluation Patient Details Name: DEADRA DIGGINS MRN: [id number] DOB: 11-Jan-1932 Today's Date: 10/24/2021 Time: 1100-1140 SLP Time Calculation (min) (ACUTE ONLY): 40 min  Problem List:  Patient Active Problem List   Diagnosis Date Noted   Acute ischemic stroke ([place]) 10/23/2021   SBO (small bowel obstruction) ([place]) 09/25/2021   Late onset Alzheimer's disease without behavioral disturbance ([place]) 02/22/2021   Stenosis of intracranial vessel 02/22/2021   CVA (cerebral vascular accident) ([place]) 11/07/2020   Hypotension 09/06/2020   NSTEMI (non-ST elevated myocardial infarction) ([place]) 09/04/2020   Vitamin D deficiency 10/27/2019   Excessive sleepiness 10/27/2019   Benign essential HTN 07/27/2018   Disorder of arteries and arterioles ([place]) 07/27/2018   Hyperlipidemia 07/27/2018   Memory loss 07/27/2018   Insomnia 07/27/2018   Abnormal brain MRI 07/27/2018   Carotid stenosis, right 07/27/2018   Past Medical History:  Past Medical History:  Diagnosis Date   Cataract    Gout    Hyperlipidemia    Hypertension    Memory loss    NSTEMI (non-ST elevated myocardial infarction) ([place]) 08/2020   Right-sided carotid artery disease ([place]) 09/17/2020   carotid doppler wth >60% occlusion   Past Surgical History:  Past Surgical History:  Procedure Laterality Date   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 [name] daughter was present during this evaluation. Pt has a history of cognitive impairment at baseline, related to dementia diagnosis. The Mini-Mental State Exam (MMSE) was administered today. Pt scored 12/30 on the  MMSE. Pt had difficulty with orientation, attention, delayed recall, following multistep commands, and design copying. She also had difficulty with clock drawing task. At this time, recommend home health speech therapy for education and to assist family with establishing routines to maximize independence and safety.    SLP Assessment  SLP Recommendation/Assessment: All further Speech Language Pathology needs can be addressed in the next venue of care  SLP Visit Diagnosis: Cognitive communication deficit (R41.841)    Recommendations for follow up therapy are one component of a multi-disciplinary discharge planning process, led by the attending physician.  Recommendations may be updated based on patient status, additional functional criteria and insurance authorization.    Follow Up Recommendations  Home health SLP    Assistance Recommended at Discharge  Frequent or constant Supervision/Assistance  Functional Status Assessment Patient has not had a recent decline in their functional status (baseline cognitive deficits)     SLP Evaluation Cognition  Overall Cognitive Status: History of cognitive impairments - at baseline Arousal/Alertness: Awake/alert Orientation Level: Oriented to person;Disoriented to place;Disoriented to time;Disoriented to situation       Comprehension  Auditory Comprehension Overall Auditory Comprehension: Impaired at baseline Conversation: Simple Interfering Components: Attention Reading Comprehension Reading Status: Within funtional limits    Expression Expression Primary Mode of Expression: Verbal Verbal Expression Overall Verbal Expression: Impaired at baseline Written Expression Dominant Hand: Right   Oral / Motor  Oral Motor/Sensory Function Overall Oral Motor/Sensory Function: Within functional limits Motor Speech Overall Motor Speech: Appears within functional limits for tasks assessed           Ramari Bray B. See Beharry, MSP, CCC-SLP Speech Language  Pathologist Office: [phone]  Shonna Chock 10/24/2021, 11:44 AM

## 2021-10-24 NOTE — Evaluation (Signed)
Physical Therapy Evaluation Patient Details Name: Katelyn Powers MRN: [id number] DOB: 1931/11/28 Today's Date: 10/24/2021  History of Present Illness  87 y/o female presented to ED on 12/27 for slurred speech, vision changes, and became unresponsive. TNK given. CT head showed R cerebellar infarct. PMH: HTN, dementia, NSTEMI, CAD  Clinical Impression  Patient admitted with above diagnosis. Patient presents with generalized weakness, decreased activity tolerance, impaired balance, and impaired cognition (hx of dementia). Patient requires minA for sit to stand transfer and ambulation with no AD. Patient lives with daughters and has necessary supervision/assist at discharge. Patient will benefit from skilled PT services during acute stay to address listed deficits. Recommend HHPT at discharge to maximize functional independence and safety. Patient will require close supervision/assist for mobility in the home at discharge, daughter verbalized understanding.        Recommendations for follow up therapy are one component of a multi-disciplinary discharge planning process, led by the attending physician.  Recommendations may be updated based on patient status, additional functional criteria and insurance authorization.  Follow Up Recommendations Home health PT    Assistance Recommended at Discharge Frequent or constant Supervision/Assistance  Functional Status Assessment Patient has had a recent decline in their functional status and demonstrates the ability to make significant improvements in function in a reasonable and predictable amount of time.  Equipment Recommendations  None recommended by PT    Recommendations for Other Services       Precautions / Restrictions Precautions Precautions: Fall Precaution Comments: SBP <180, mittens Restrictions Weight Bearing Restrictions: No      Mobility  Bed Mobility Overal bed mobility: Needs Assistance Bed Mobility: Supine to Sit     Supine to  sit: Min guard          Transfers Overall transfer level: Needs assistance Equipment used: None Transfers: Sit to/from Stand Sit to Stand: Min assist           General transfer comment: minA to steady due to patient initially leaning posteriorly. Requiring momentum to achieve upright. Using bed/chair to brace self to avoid posterior LOB    Ambulation/Gait Ambulation/Gait assistance: Min assist Gait Distance (Feet): 250 Feet Assistive device: None Gait Pattern/deviations: Step-through pattern;Decreased stride length;Drifts right/left Gait velocity: decreased     General Gait Details: minA for balance. LOB x 3 during ambulation. Slow guarded gait.  Stairs            Wheelchair Mobility    Modified Rankin (Stroke Patients Only)       Balance Overall balance assessment: Needs assistance Sitting-balance support: No upper extremity supported;Feet supported Sitting balance-Leahy Scale: Good     Standing balance support: No upper extremity supported;During functional activity Standing balance-Leahy Scale: Poor Standing balance comment: minA for balance                             Pertinent Vitals/Pain Pain Assessment: No/denies pain    Home Living Family/patient expects to be discharged to:: Private residence Living Arrangements: Children Available Help at Discharge: Family;Available 24 hours/day Type of Home: House Home Access: Stairs to enter   [employer] of Steps: 5   Home Layout: One level Home Equipment: Conservation officer, nature (2 wheels);Cane - single point;Shower seat Additional Comments: daughters alternate between homes every week. At most, 5 stairs to enter a home (number obtained from previous admission).    Prior Function Prior Level of Function : Independent/Modified Independent  Mobility Comments: ambulates with no AD. family allows patient to be as independentas possible ADLs Comments: sponges bathes at  baseline     Hand Dominance        Extremity/Trunk Assessment   Upper Extremity Assessment Upper Extremity Assessment: Defer to OT evaluation    Lower Extremity Assessment Lower Extremity Assessment: Generalized weakness;Difficult to assess due to impaired cognition (coordination appears intact)    Cervical / Trunk Assessment Cervical / Trunk Assessment: Normal  Communication   Communication: No difficulties  Cognition Arousal/Alertness: Awake/alert Behavior During Therapy: Restless Overall Cognitive Status: History of cognitive impairments - at baseline                                 General Comments: hx of dementia. Following commands with increased time and repetition        General Comments      Exercises     Assessment/Plan    PT Assessment Patient needs continued PT services  PT Problem List Decreased activity tolerance;Decreased strength;Decreased balance;Decreased mobility;Decreased safety awareness       PT Treatment Interventions DME instruction;Gait training;Stair training;Functional mobility training;Therapeutic activities;Therapeutic exercise;Balance training;Neuromuscular re-education;Patient/family education    PT Goals (Current goals can be found in the Care Plan section)  Acute Rehab PT Goals Patient Stated Goal: did not state PT Goal Formulation: With family Time For Goal Achievement: 11/07/21 Potential to Achieve Goals: Good    Frequency Min 4X/week   Barriers to discharge        Co-evaluation               AM-PAC PT "6 Clicks" Mobility  Outcome Measure Help needed turning from your back to your side while in a flat bed without using bedrails?: A Little Help needed moving from lying on your back to sitting on the side of a flat bed without using bedrails?: A Little Help needed moving to and from a bed to a chair (including a wheelchair)?: A Little Help needed standing up from a chair using your arms (e.g.,  wheelchair or bedside chair)?: A Little Help needed to walk in hospital room?: A Little Help needed climbing 3-5 steps with a railing? : A Little 6 Click Score: 18    End of Session Equipment Utilized During Treatment: Gait belt Activity Tolerance: Patient tolerated treatment well Patient left: in chair;with call bell/phone within reach;with chair alarm set;with family/visitor present Nurse Communication: Mobility status PT Visit Diagnosis: Unsteadiness on feet (R26.81);Muscle weakness (generalized) (M62.81)    Time: 6226-3335 PT Time Calculation (min) (ACUTE ONLY): 28 min   Charges:   PT Evaluation $PT Eval Moderate Complexity: 1 Mod          Jsiah Menta A. Gilford Rile PT, DPT Acute Rehabilitation Services Pager [phone] Office [phone]   Linna Hoff 10/24/2021, 10:21 AM

## 2021-10-25 ENCOUNTER — Telehealth: Payer: Self-pay | Admitting: Interventional Cardiology

## 2021-10-25 ENCOUNTER — Telehealth: Payer: Self-pay | Admitting: Neurology

## 2021-10-25 DIAGNOSIS — I10 Essential (primary) hypertension: Secondary | ICD-10-CM

## 2021-10-25 DIAGNOSIS — I679 Cerebrovascular disease, unspecified: Secondary | ICD-10-CM

## 2021-10-25 DIAGNOSIS — E7800 Pure hypercholesterolemia, unspecified: Secondary | ICD-10-CM

## 2021-10-25 DIAGNOSIS — G459 Transient cerebral ischemic attack, unspecified: Principal | ICD-10-CM

## 2021-10-25 MED ORDER — EZETIMIBE 10 MG PO TABS: 10.0000 mg | ORAL_TABLET | Freq: Every day | ORAL | Status: DC

## 2021-10-25 MED ORDER — EZETIMIBE 10 MG PO TABS: 10.0000 mg | ORAL_TABLET | Freq: Every day | ORAL | 1 refills | Status: DC

## 2021-10-25 MED ORDER — TICAGRELOR 90 MG PO TABS: 90.0000 mg | ORAL_TABLET | Freq: Two times a day (BID) | ORAL | Status: DC

## 2021-10-25 MED ORDER — PRAVASTATIN SODIUM 40 MG PO TABS: 40.0000 mg | ORAL_TABLET | Freq: Every day | ORAL | Status: DC

## 2021-10-25 MED ORDER — PRAVASTATIN SODIUM 40 MG PO TABS: 40.0000 mg | ORAL_TABLET | Freq: Every day | ORAL | 1 refills | Status: AC

## 2021-10-25 MED ORDER — ASPIRIN 81 MG PO TBEC: 81.0000 mg | DELAYED_RELEASE_TABLET | Freq: Every day | ORAL | 11 refills | Status: AC

## 2021-10-25 MED ORDER — TICAGRELOR 90 MG PO TABS: 90.0000 mg | ORAL_TABLET | Freq: Two times a day (BID) | ORAL | 0 refills | Status: DC

## 2021-10-25 NOTE — Consult Note (Signed)
Patient admitted with TIA likely due to multifocal extracranial and intracranial stenosis. Imaging shows right ICA stenosis and Neuro Interventional  Radiology was asked to speak with the patient and her family at the bedside to discuss possible stent placement.   Dr. Karenann Powers met with the patient and her daughter Katelyn Powers who was at the bedside while the patient's other daughter Katelyn Powers was present via speaker phone.   Time was spent discussing the imaging findings and treatment options. The risks of the procedures - cerebral angiogram only versus cerebral angiogram with stent placement were discussed. The risks of conservative management (medications, follow up imaging) were discussed. Katelyn Powers was very concerned regarding the discrepancies in percentage of stenosis that varied from the different imaging modalities. Dr. Karenann Powers explained that calcifications within the vessel make it challenging to form a precise measurement and that a cerebral angiogram is the most accurate method available to discern the percent of stenosis.   The family expressed a desire to discuss their options further. They also stated they wanted to speak with their mother's neurologist and cardiologist. The phone number to the outpatient clinic was provided with instructions to please call our office if they have further questions or if they wish to proceed with either the diagnostic cerebral angiogram or a cerebral angiogram with planned intervention/stent placement.   Katelyn Powers, [place] [phone] 10/25/2021, 5:13 PM

## 2021-10-25 NOTE — Progress Notes (Signed)
STROKE TEAM PROGRESS NOTE    INTERVAL HISTORY Patient is seen in Katelyn Powers room with Katelyn Powers daughters at the bedside.  She has moved out of the ICU overnight.  She has been hemodynamically stable and Katelyn Powers neurological exam is stable.  MRI reveals no stroke.  Patient's family is discussing whether they would like to have stenting or other intervention for patient's carotid stenosis.  Vitals:   10/25/21 0018 10/25/21 0427 10/25/21 0901 10/25/21 1221  BP: (!) 172/72 (!) 149/78 134/74 116/64  Pulse: 76 71 76 80  Resp: 18 17 16 14   Temp: (!) 97.5 F (36.4 C) (!) 97.5 F (36.4 C) 97.9 F (36.6 C) 97.6 F (36.4 C)  TempSrc: Oral Axillary Oral Oral  SpO2: 95% 97% 97% 97%  Weight:      Height:       CBC:  Recent Labs  Lab 10/23/21 1735 10/23/21 1744 10/24/21 0402  WBC 8.2  --  8.7  NEUTROABS 5.6  --   --   HGB 13.8 14.3 13.1  HCT 42.4 42.0 40.2  MCV 92.6  --  89.9  PLT 168  --  654    Basic Metabolic Panel:  Recent Labs  Lab 10/23/21 1735 10/23/21 1744 10/24/21 0402  NA 139 140 141  K 4.3 4.3 3.8  CL 107 110 109  CO2 20*  --  20*  GLUCOSE 93 91 110*  BUN 23 29* 18  CREATININE 0.98 1.00 0.91  CALCIUM 9.4  --  9.2    Lipid Panel:  Recent Labs  Lab 10/24/21 0402  CHOL 224*  TRIG 260*  HDL 37*  CHOLHDL 6.1  VLDL 52*  LDLCALC 135*    HgbA1c:  Recent Labs  Lab 10/24/21 0402  HGBA1C 5.9*    Urine Drug Screen:  Recent Labs  Lab 10/23/21 1717  LABOPIA NONE DETECTED  COCAINSCRNUR NONE DETECTED  LABBENZ NONE DETECTED  AMPHETMU NONE DETECTED  THCU NONE DETECTED  LABBARB NONE DETECTED     Alcohol Level  Recent Labs  Lab 10/24/21 0402  ETH <10     IMAGING past 24 hours MR BRAIN WO CONTRAST  Result Date: 10/24/2021 CLINICAL DATA:  Stroke suspected, visual changes, slurred speech EXAM: MRI HEAD WITHOUT CONTRAST TECHNIQUE: Multiplanar, multiecho pulse sequences of the brain and surrounding structures were obtained without intravenous contrast. COMPARISON:   11/07/2020 MRI, correlation is also made with 10/23/2021 CT head and CTA head and neck FINDINGS: Brain: No restricted diffusion to suggest acute or subacute infarct. No acute hemorrhage, mass, mass effect, or midline shift. Sequela of prior right cerebellar, left parietal, and right frontal infarcts. T2 hyperintense signal in the periventricular white matter, likely the sequela of moderate chronic small vessel ischemic disease. Disproportionate atrophy in the left temporal lobe, with significant widening of the left temporal horn compared to the right, which appears unchanged. Degree of global cerebral atrophy is likely within normal limits for age. Vascular: Normal flow voids. Skull and upper cervical spine: Normal marrow signal. Sinuses/Orbits: Minimal mucosal thickening in the right maxillary sinus. Otherwise negative. Other: Fluid in left mastoid air cells. IMPRESSION: No evidence of acute or subacute infarct. Electronically Signed   By: Merilyn Baba M.D.   On: 10/24/2021 16:46    PHYSICAL EXAM General:  Alert, well developed, well nourished patient in no acute distress   NEURO:  Mental Status: AA&Ox1 Speech/Language: speech is without dysarthria or aphasia.  Naming, repetition, fluency, and comprehension intact.  Cranial Nerves:  II: PERRL. Visual fields full.  III, IV, VI: EOMI. Eyelids elevate symmetrically.  V: Sensation is intact to light touch and symmetrical to face.  VII: Smile is symmetrical.  VIII: hearing intact to voice. IX, X: Phonation is normal.  XII: tongue is midline without fasciculations. Motor: 5/5 strength to all muscle groups tested.  Tone: is normal and bulk is normal Sensation- Intact to light touch bilaterally. Extinction absent to light touch to DSS.   Coordination: FTN intact bilaterally.No drift.  Gait- deferred   ASSESSMENT/PLAN Katelyn Powers is a 87 y.o. female with history of HTN, HLD, dementia and NSTEMI presenting with slurred speech, left sided  weakness and vision changes.  TNK was given on arrival to the ED, and patient's symptoms rapidly improved.  Thrombectomy was not performed due to improvement of symptoms.  TIA - likely due to multifocal extracranial and intracranial stenosis Code Stroke CT head Remote right cerebellar infarct ASPECTS 10.    CTA head & neck Focal non opacification of left P1, severe focal stenosis in right M1, right A1 and left P2, 65-70% stenosis in proximal right ICA MRI  No evidence of acute or subacute infarct Carotis doppler right ICA 40-59% stenosis, left ICA 1-39% stenosis 2D Echo EF 55-60%, no atrial level shunt LDL 135 HgbA1c 5.9 VTE prophylaxis - SCDs aspirin 81 mg daily and clopidogrel 75 mg daily prior to admission, now on aspirin 81 mg daily and clopidogrel 75 mg daily  Therapy recommendations:  home health PT/OT Disposition:  pending  Hypertension Home meds:  losartan 50 mg daily Stable Keep BP <180/105 Long-term BP goal normotensive  Hyperlipidemia Home meds:  none LDL 135, goal < 70 Add atorvastatin 40 mg daily  Continue statin at discharge   Other Stroke Risk Factors Advanced Age >/= 45  Former cigarette smoker Obesity, Body mass index is 30.4 kg/m., BMI >/= 30 associated with increased stroke risk, recommend weight loss, diet and exercise as appropriate  Coronary artery disease  Other Active Problems Dementia Reorient PRN Delirium precautions  Hospital day # [place] , MSN, AGACNP-BC [facility] See Amion for schedule and pager information 10/25/2021 2:10 PM    To contact Stroke Continuity provider, please refer to [email or web address] After hours, contact General Neurology

## 2021-10-25 NOTE — Telephone Encounter (Signed)
I called daughter, Benita Stabile and got VM (mobile and home).   I will call again in the morning

## 2021-10-25 NOTE — Discharge Instructions (Addendum)
Katelyn Powers, you were admitted with slurred speech, left-sided weakness and vision changes.  You were given TNK in the ED to treat a stroke, and your symptoms rapidly improved.  You were later found to have no stroke on your MRI.  You were found to have stenosis of the right carotid artery, and the plan is for you to discuss treatment of this with your outpatient neurologist and primary care provider.  You will need to take pravstatin and zetia to lower your cholesterol to prevent this stenosis from getting worse.  You will take Brillinta and aspirin for thirty days, followed by Plavix and aspirin to prevent stroke in the future.  If you experience recurrent symptoms of stroke, call 911 and go to the emergency department immediately.

## 2021-10-25 NOTE — Progress Notes (Signed)
Occupational Therapy Treatment Patient Details Name: MARTIN SMEAL MRN: [id number] DOB: [birth year] Today's Date: 10/25/2021   History of present illness 87 y/o female presented to ED on 12/27 for slurred speech, vision changes, and became unresponsive. TNK given. CT head showed R cerebellar infarct. PMH: HTN, dementia, NSTEMI, CAD   OT comments  Pt making good progress with OT goals this session. Pt able to complete most basic ADL's this session at min guard level for safety and cueing for sequencing/safety. Pt is mobilizing well with no DME. Requiring at least supervision level at all times due to cognitive deficits and mild balance deficits. OT will continue to follow acutely.    Recommendations for follow up therapy are one component of a multi-disciplinary discharge planning process, led by the attending physician.  Recommendations may be updated based on patient status, additional functional criteria and insurance authorization.    Follow Up Recommendations  Home health OT    Assistance Recommended at Discharge Intermittent Supervision/Assistance  Equipment Recommendations  None recommended by OT    Recommendations for Other Services      Precautions / Restrictions Precautions Precautions: Fall Restrictions Weight Bearing Restrictions: No       Mobility Bed Mobility Overal bed mobility: Needs Assistance Bed Mobility: Supine to Sit     Supine to sit: Supervision     General bed mobility comments: No difficulties    Transfers Overall transfer level: Needs assistance Equipment used: None Transfers: Sit to/from Stand Sit to Stand: Min guard           General transfer comment: Min guard for safety     Balance Overall balance assessment: Needs assistance Sitting-balance support: No upper extremity supported;Feet supported Sitting balance-Leahy Scale: Good     Standing balance support: No upper extremity supported;During functional activity Standing  balance-Leahy Scale: Fair Standing balance comment: A little wobbly                           ADL either performed or assessed with clinical judgement   ADL Overall ADL's : Needs assistance/impaired     Grooming: Min guard;Standing Grooming Details (indicate cue type and reason): Needs verbal cues for attention and sequencing             Lower Body Dressing: Min guard;Sitting/lateral leans;Sit to/from stand Lower Body Dressing Details (indicate cue type and reason): donned mess panties and pants Toilet Transfer: Min guard;Ambulation Toilet Transfer Details (indicate cue type and reason): no difficulties to and from bathroom, cuing for safety Toileting- Clothing Manipulation and Hygiene: Min guard;Sitting/lateral lean;Sit to/from stand Toileting - Clothing Manipulation Details (indicate cue type and reason): completed on toilet     Functional mobility during ADLs: Min guard General ADL Comments: Pt overall at min guard level with verbal cues needed throughout.    Extremity/Trunk Assessment              Vision       Perception     Praxis      Cognition Arousal/Alertness: Awake/alert Behavior During Therapy: Restless Overall Cognitive Status: History of cognitive impairments - at baseline                                 General Comments: hx of dementia. Following commands with increased time and repetition          Exercises     Shoulder Instructions  General Comments VSS on RA    Pertinent Vitals/ Pain       Pain Assessment: No/denies pain  Home Living                                          Prior Functioning/Environment              Frequency  Min 2X/week        Progress Toward Goals  OT Goals(current goals can now be found in the care plan section)  Progress towards OT goals: Progressing toward goals  Acute Rehab OT Goals Patient Stated Goal: To go home OT Goal Formulation: With  patient/family Time For Goal Achievement: 11/07/21 Potential to Achieve Goals: Good ADL Goals Pt Will Perform Grooming: with modified independence;standing Pt Will Perform Upper Body Bathing: with set-up;sitting Pt Will Transfer to Toilet: with set-up;ambulating;regular height toilet Additional ADL Goal #1: pt will complete DGI score <19 to indicate decrease fall risk with ADLS  Plan Discharge plan remains appropriate;Frequency remains appropriate    Co-evaluation                 AM-PAC OT "6 Clicks" Daily Activity     Outcome Measure   Help from another person eating meals?: None Help from another person taking care of personal grooming?: A Little Help from another person toileting, which includes using toliet, bedpan, or urinal?: A Little Help from another person bathing (including washing, rinsing, drying)?: A Little Help from another person to put on and taking off regular upper body clothing?: A Little Help from another person to put on and taking off regular lower body clothing?: A Little 6 Click Score: 19    End of Session Equipment Utilized During Treatment: Gait belt  OT Visit Diagnosis: Unsteadiness on feet (R26.81)   Activity Tolerance Patient tolerated treatment well   Patient Left with family/visitor present (EOB, MD x2 present)   Nurse Communication Mobility status        Time: 5830-9407 OT Time Calculation (min): 12 min  Charges: OT General Charges $OT Visit: 1 Visit OT Treatments $Self Care/Home Management : 8-22 mins  Tarren Sabree H., OTR/L Acute Rehabilitation  Masiah Lewing Elane Aya Geisel 10/25/2021, 4:54 PM

## 2021-10-25 NOTE — TOC Transition Note (Addendum)
Transition of Care [facility]) - CM/SW Discharge Note   Patient Details  Name: Katelyn Powers MRN: [id number] Date of Birth: Dec 27, 1931  Transition of Care [facility]) CM/SW Contact:  Pollie Friar, RN Phone Number: 10/25/2021, 12:06 PM   Clinical Narrative:    Patient is from home with her daughters. She spends a week at a time with one daughter and then the other. They oversee her medications at home. They also provide needed transportation.  DME at home: walker/ shower seat Recommendations for home health services. CM met with the patient and her daughter. They asked to use [facility] since they have used them in the past and had a good experience. Amy with [facility] will look at  the referral. Information on the AVS. Pt has transport home when discharged.   1400: [facility] has denied the patient for [facility] services. CM called several other [place] and was able to ger her set up with [facility]. CM will update the patients daughter.   1630: [facility] 30 day free card provided to family for the patient.   Final next level of care: Home w Home Health Services Barriers to Discharge: No Barriers Identified   Patient Goals and CMS Choice   CMS Medicare.gov Compare Post Acute Care list provided to:: Patient Represenative (must comment) Choice offered to / list presented to : Adult Children  Discharge Placement                       Discharge Plan and Services                          HH Arranged: PT, OT HH Agency: [place] Date [facility] Agency Contacted: 10/25/21   Representative spoke with at [place]: Amy  Social Determinants of Health ([place]) Interventions     Readmission Risk Interventions No flowsheet data found.

## 2021-10-25 NOTE — Telephone Encounter (Signed)
Pt c/o medication issue:  1. Name of Medication:  Zetia  2. How are you currently taking this medication (dosage and times per day)?   3. Are you having a reaction (difficulty breathing--STAT)?   4. What is your medication issue?   Patient's daughter is following up, requesting to speak with Marveen Reeks., RN to provide update on patient. Patient is currently admitted, post stroke. Had MRI and everything looked fine, but they are considering stents, but daughter has concerns with with this. She would also like to discuss Zetia.

## 2021-10-25 NOTE — Discharge Summary (Addendum)
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   aspirin 81 MG EC tablet Take 1 tablet (81 mg total) by mouth daily. Swallow whole. Start taking on: October 26, 2021 Replaces: aspirin 81 MG tablet   ezetimibe 10 MG tablet Commonly known as: ZETIA Take 1 tablet (10 mg total) by mouth daily.   Ginkgo Biloba 120 MG Caps Take 120 mg by mouth in the morning.   losartan 50 MG tablet Commonly known as: COZAAR Take 1 tablet (50 mg total) by mouth daily.   Melatonin 10 MG Tabs Take 10 mg by mouth at bedtime. NEEDS NIGHTLY TO SLEEP OR WILL BE RESTLESS   nitroGLYCERIN 0.4 MG SL tablet Commonly known as: NITROSTAT Place 1 tablet (0.4 mg total) under the tongue every 5 (five) minutes as needed for chest pain.   NON FORMULARY Take 2 tablets by mouth See admin instructions. [employer] Mushroom tablets- Take 2 tablets by mouth daily with lunch   NON FORMULARY Take 2 tablets by mouth See admin instructions. Real Mushrooms Reishi Mushroom Extract tablets- Take 2 tablets by mouth daily with lunch   NON FORMULARY Take 2 capsules by mouth See admin instructions. Living[employer] Daily Healthy  Cholesterol Support capsules- Take 2 capsules by mouth at bedtime   OMEGA-3 RX COMPLETE PO Take 1 capsule by mouth at bedtime.   pravastatin 40 MG tablet Commonly known as: PRAVACHOL Take 1 tablet (40 mg total) by mouth daily at 6 PM.   ticagrelor 90 MG Tabs tablet Commonly known as: BRILINTA Take 1 tablet (90 mg total) by mouth 2 (two) times daily.   VITAMIN D3 PO Take 1 capsule by mouth daily. Vit D3 (4,000U), Vit A, K2+ 4 immune boosters- Take 1 capsule by mouth daily   vitamin E 1000 UNIT capsule Take 1,000 Units by mouth daily.        LABORATORY STUDIES CBC    Component Value Date/Time   WBC 8.7 10/24/2021 0402   RBC 4.47 10/24/2021 0402   HGB 13.1 10/24/2021 0402   HCT 40.2 10/24/2021 0402   PLT 178 10/24/2021 0402   MCV 89.9 10/24/2021 0402   MCH 29.3 10/24/2021 0402   MCHC 32.6 10/24/2021 0402   RDW 13.6 10/24/2021 0402   LYMPHSABS 1.7 10/23/2021 1735   MONOABS 0.7 10/23/2021 1735   EOSABS 0.2 10/23/2021 1735   BASOSABS 0.1 10/23/2021 1735   CMP    Component Value Date/Time   NA 141 10/24/2021 0402   K 3.8 10/24/2021 0402   CL 109  10/24/2021 0402   CO2 20 (L) 10/24/2021 0402   GLUCOSE 110 (H) 10/24/2021 0402   BUN 18 10/24/2021 0402   CREATININE 0.91 10/24/2021 0402   CALCIUM 9.2 10/24/2021 0402   PROT 6.9 10/24/2021 0402   ALBUMIN 3.6 10/24/2021 0402   AST 17 10/24/2021 0402   ALT 11 10/24/2021 0402   ALKPHOS 73 10/24/2021 0402   BILITOT 1.0 10/24/2021 0402   GFRNONAA >60 10/24/2021 0402   GFRAA 56 (L) 05/10/2020 0150   COAGS Lab Results  Component Value Date   INR 1.0 10/23/2021   INR  10/23/2021    QUESTIONABLE RESULTS, RECOMMEND RECOLLECT TO VERIFY   INR 1.1 09/04/2020   Lipid Panel    Component Value Date/Time   CHOL 224 (H) 10/24/2021 0402   TRIG 260 (H) 10/24/2021 0402   HDL 37 (L) 10/24/2021 0402   CHOLHDL 6.1 10/24/2021 0402   VLDL 52 (H) 10/24/2021 0402   LDLCALC 135 (H) 10/24/2021 0402   HgbA1C  Lab Results  Component  Value Date   HGBA1C 5.9 (H) 10/24/2021   Urinalysis    Component Value Date/Time   COLORURINE COLORLESS (A) 10/23/2021 1717   APPEARANCEUR CLEAR 10/23/2021 1717   LABSPEC 1.017 10/23/2021 1717   PHURINE 5.0 10/23/2021 1717   GLUCOSEU NEGATIVE 10/23/2021 1717   HGBUR SMALL (A) 10/23/2021 1717   BILIRUBINUR NEGATIVE 10/23/2021 1717   KETONESUR NEGATIVE 10/23/2021 1717   PROTEINUR NEGATIVE 10/23/2021 1717   NITRITE NEGATIVE 10/23/2021 1717   LEUKOCYTESUR NEGATIVE 10/23/2021 1717   Urine Drug Screen     Component Value Date/Time   LABOPIA NONE DETECTED 10/23/2021 1717   COCAINSCRNUR NONE DETECTED 10/23/2021 1717   LABBENZ NONE DETECTED 10/23/2021 1717   AMPHETMU NONE DETECTED 10/23/2021 1717   THCU NONE DETECTED 10/23/2021 1717   LABBARB NONE DETECTED 10/23/2021 1717    Alcohol Level    Component Value Date/Time   ETH <10 10/24/2021 0402     SIGNIFICANT DIAGNOSTIC STUDIES CT Abdomen Pelvis Wo Contrast  Result Date: 09/25/2021 CLINICAL DATA:  Abdominal distension. EXAM: CT ABDOMEN AND PELVIS WITHOUT CONTRAST TECHNIQUE: Multidetector CT imaging of the abdomen and pelvis was performed following the standard protocol without IV contrast. COMPARISON:  None. FINDINGS: Lower chest: Mild linear atelectasis is seen within the bilateral lung bases. Hepatobiliary: No focal liver abnormality is seen. A 1.6 cm gallstone is seen within the lumen of an otherwise normal-appearing gallbladder. Pancreas: Unremarkable. No pancreatic ductal dilatation or surrounding inflammatory changes. Spleen: Normal in size without focal abnormality. Adrenals/Urinary Tract: The right adrenal gland is normal in appearance. 1.0 cm x 0.8 cm and 1.3 cm x 0.8 cm low-attenuation left adrenal masses are noted (approximately 3.6 Hounsfield units). Kidneys are normal in size, without renal calculi or hydronephrosis. 2.5 cm diameter and 4.1 cm diameter cysts are seen within the right kidney. Bladder is unremarkable.  Stomach/Bowel: There is a small hiatal hernia. Appendix appears normal. Multiple dilated small bowel loops are seen throughout the abdomen (maximum small bowel diameter of approximately 3.2 cm). A gradual transition zone is seen within the mid to lower left abdomen (axial CT images 41 through 55, CT series 2). This is within the mid to distal jejunum. Noninflamed diverticula are seen throughout the large bowel. Vascular/Lymphatic: Aortic atherosclerosis. No enlarged abdominal or pelvic lymph nodes. Reproductive: Uterus and bilateral adnexa are unremarkable. Other: No abdominal wall hernia or abnormality. No abdominopelvic ascites. Musculoskeletal: Multilevel degenerative changes are seen throughout the lumbar spine. IMPRESSION: 1. Mid partial small bowel  obstruction. 2. Cholelithiasis. 3. Colonic diverticulosis. 4. Small low-attenuation left adrenal masses which likely represent adrenal adenomas. 5. Aortic atherosclerosis. Aortic Atherosclerosis (ICD10-I70.0). Electronically Signed   By: Virgina Norfolk M.D.   On: 09/25/2021 23:19   DG Abd 1 View  Result Date: 09/26/2021 CLINICAL DATA:  Small bowel obstruction. Abdominal pain and vomiting EXAM: ABDOMEN - 1 VIEW COMPARISON:  04/09/2021 FINDINGS: Supine views of the abdomen were obtained. Nonobstructive bowel gas pattern. Again noted is levoscoliosis in the lumbar spine. Small amount of gas in the stomach. Stool in the left upper quadrant of the abdomen. IMPRESSION: Nonobstructive bowel gas pattern. Electronically Signed   By: Markus Daft M.D.   On: 09/26/2021 08:59   MR BRAIN WO CONTRAST  Result Date: 10/24/2021 CLINICAL DATA:  Stroke suspected, visual changes, slurred speech EXAM: MRI HEAD WITHOUT CONTRAST TECHNIQUE: Multiplanar, multiecho pulse sequences of the brain and surrounding structures were obtained without intravenous contrast. COMPARISON:  11/07/2020 MRI, correlation is also made with 10/23/2021 CT head and CTA head and neck FINDINGS: Brain: No  restricted diffusion to suggest acute or subacute infarct. No acute hemorrhage, mass, mass effect, or midline shift. Sequela of prior right cerebellar, left parietal, and right frontal infarcts. T2 hyperintense signal in the periventricular white matter, likely the sequela of moderate chronic small vessel ischemic disease. Disproportionate atrophy in the left temporal lobe, with significant widening of the left temporal horn compared to the right, which appears unchanged. Degree of global cerebral atrophy is likely within normal limits for age. Vascular: Normal flow voids. Skull and upper cervical spine: Normal marrow signal. Sinuses/Orbits: Minimal mucosal thickening in the right maxillary sinus. Otherwise negative. Other: Fluid in left mastoid air cells. IMPRESSION: No evidence of acute or subacute infarct. Electronically Signed   By: Merilyn Baba M.D.   On: 10/24/2021 16:46   DG CHEST PORT 1 VIEW  Result Date: 09/25/2021 CLINICAL DATA:  Nausea with leukocytosis. EXAM: PORTABLE CHEST 1 VIEW COMPARISON:  09/04/2020 portable chest FINDINGS: There is mild cardiomegaly and mild central vascular fullness. This could be due to interval low lung volumes or mild perihilar vascular congestion. There are increased interstitial markings throughout the hypoinflated lungs, which could indicate interstitial edema, interstitial pneumonitis or changes of low inspiration. There is patchy calcification of the aorta. No dense focal pulmonary infiltrate is seen. Generalized osteopenia. Right axillary surgical clips are again noted. IMPRESSION: There are increased interstitial markings in the lung fields and mild central vascular fullness, but the lungs are near expiratory in the current exam. This could relate to changes of low inspiration or the patient could have mild perihilar vascular congestion and interstitial edema or an interstitial pneumonitis. No pleural effusion is seen. A follow-up study is recommended in full  inspiration. Electronically Signed   By: Telford Nab M.D.   On: 09/25/2021 23:51   ECHOCARDIOGRAM COMPLETE  Result Date: 09/26/2021    ECHOCARDIOGRAM REPORT   Patient Name:   WYNETTE JERSEY Date of Exam: 09/26/2021 Medical Rec #:  [id number]   Height:       62.0 in Accession #:    [id number]  Weight:       155.0 lb Date of Birth:  [birth year]  BSA:          1.715 m Patient Age:    88 years    BP:           161/82 mmHg Patient Gender: F           HR:  75 bpm. Exam Location:  Inpatient Procedure: 2D Echo, 3D Echo, Cardiac Doppler and Color Doppler Indications:    Murmur R01.1  History:        Patient has prior history of Echocardiogram examinations, most                 recent 09/06/2020. CHF, CAD and Previous Myocardial Infarction,                 Carotid Disease; Risk Factors:Hypertension and Dyslipidemia.  Sonographer:    Darlina Sicilian RDCS Referring Phys: [phone] ANASTASSIA DOUTOVA  Sonographer Comments: Global longitudinal strain was attempted. IMPRESSIONS  1. Left ventricular ejection fraction, by estimation, is 55 to 60%. The left ventricle has normal function. The left ventricle has no regional wall motion abnormalities. Impaired relaxation with elevated left atrial pressure.  2. Right ventricular systolic function is normal. The right ventricular size is normal.  3. The mitral valve is normal in structure. Mild to moderate mitral valve regurgitation. No evidence of mitral stenosis.  4. The aortic valve is calcified. Aortic valve regurgitation is mild. Paradoxical moderate low flow low gradient aortic stenosis. AVA by VTI 0.87 cm2, Aortic valve mean gradient measures 11.0 mmHg. Aortic valve Vmax measures 2.14 m/s, DI 0.34, SVI 26.  5. The inferior vena cava is normal in size with greater than 50% respiratory variability, suggesting right atrial pressure of 3 mmHg. FINDINGS  Left Ventricle: Left ventricular ejection fraction, by estimation, is 55 to 60%. The left ventricle has normal function. The  left ventricle has no regional wall motion abnormalities. The left ventricular internal cavity size was normal in size. There is  no left ventricular hypertrophy. Impaired relaxation with elevated left atrial pressure. Right Ventricle: The right ventricular size is normal. No increase in right ventricular wall thickness. Right ventricular systolic function is normal. Left Atrium: Left atrial size was normal in size. Right Atrium: Right atrial size was normal in size. Pericardium: There is no evidence of pericardial effusion. Mitral Valve: The mitral valve is normal in structure. Mild to moderate mitral valve regurgitation. No evidence of mitral valve stenosis. Tricuspid Valve: The tricuspid valve is normal in structure. Tricuspid valve regurgitation is not demonstrated. No evidence of tricuspid stenosis. Aortic Valve: The aortic valve is calcified. Aortic valve regurgitation is mild. Paradoxical moderate low flow low gradient aortic stenosis. Aortic valve mean gradient measures 11.0 mmHg. Aortic valve peak gradient measures 18.4 mmHg. Aortic valve area, by VTI measures 0.87 cm. Pulmonic Valve: The pulmonic valve was normal in structure. Pulmonic valve regurgitation is not visualized. No evidence of pulmonic stenosis. Aorta: The aortic root is normal in size and structure. Venous: The inferior vena cava is normal in size with greater than 50% respiratory variability, suggesting right atrial pressure of 3 mmHg. IAS/Shunts: No atrial level shunt detected by color flow Doppler.  LEFT VENTRICLE PLAX 2D LVIDd:         3.95 cm   Diastology LVIDs:         2.80 cm   LV e' medial:    3.41 cm/s LV PW:         0.90 cm   LV E/e' medial:  31.7 LV IVS:        1.00 cm   LV e' lateral:   4.22 cm/s LVOT diam:     1.80 cm   LV E/e' lateral: 25.6 LV SV:         45 LV SV Index:   26 LVOT Area:  2.54 cm  RIGHT VENTRICLE RV S prime:     11.60 cm/s TAPSE (M-mode): 1.8 cm LEFT ATRIUM           Index        RIGHT ATRIUM          Index  LA diam:      3.40 cm 1.98 cm/m   RA Area:     9.95 cm LA Vol (A4C): 34.9 ml 20.34 ml/m  RA Volume:   20.70 ml 12.07 ml/m  AORTIC VALVE AV Area (Vmax):    1.06 cm AV Area (Vmean):   0.88 cm AV Area (VTI):     0.87 cm AV Vmax:           214.50 cm/s AV Vmean:          154.500 cm/s AV VTI:            0.514 m AV Peak Grad:      18.4 mmHg AV Mean Grad:      11.0 mmHg LVOT Vmax:         89.40 cm/s LVOT Vmean:        53.300 cm/s LVOT VTI:          0.175 m LVOT/AV VTI ratio: 0.34  AORTA Ao Root diam: 3.20 cm Ao Asc diam:  3.20 cm MITRAL VALVE MV Area (PHT): 4.77 cm     SHUNTS MV Decel Time: 159 msec     Systemic VTI:  0.18 m MV E velocity: 108.00 cm/s  Systemic Diam: 1.80 cm MV A velocity: 124.00 cm/s MV E/A ratio:  0.87 Kardie Tobb DO Electronically signed by Berniece Salines DO Signature Date/Time: 09/26/2021/12:49:20 PM    Final    CT HEAD CODE STROKE WO CONTRAST  Result Date: 10/23/2021 CLINICAL DATA:  Code stroke. Left-sided facial droop, left-sided weakness EXAM: CT HEAD WITHOUT CONTRAST TECHNIQUE: Contiguous axial images were obtained from the base of the skull through the vertex without intravenous contrast. COMPARISON:  04/09/2021. FINDINGS: Brain: No definite acute infarct. No acute hemorrhage, mass, mass effect, or midline shift. Hypodensity in the right cerebellum, likely sequela of remote infarct, although this is new compared to April 09, 2021. Redemonstrated remote left parietal and right frontal subcortical white matter infarct. Periventricular white matter changes, likely the sequela of chronic small vessel ischemic disease. Slightly disproportionate atrophy in the medial left temporal lobe, unchanged. Vascular: No hyperdense vessel. Atherosclerotic calcifications in the intracranial carotid and vertebral arteries. Skull: Normal. Negative for fracture or focal lesion. Sinuses/Orbits: No acute finding. Other: The mastoids are well aerated. ASPECTS [facility] Stroke Program Early CT Score) - Ganglionic  level infarction (caudate, lentiform nuclei, internal capsule, insula, M1-M3 cortex): 7 - Supraganglionic infarction (M4-M6 cortex): 3 Total score (0-10 with 10 being normal): 10 IMPRESSION: 1. Right cerebellar infarct, of indeterminate acuity, new compared to 04/09/2021, but favored to be remote. No other acute intracranial process. 2. ASPECTS is 10 Code stroke imaging results were communicated on 10/23/2021 at 5:38 pm to provider [facility] via secure text paging. Electronically Signed   By: Merilyn Baba M.D.   On: 10/23/2021 17:38   VAS US CAROTID  Result Date: 10/03/2021 Carotid Arterial Duplex Study Patient Name:  MALAYNA NOORI  Date of Exam:   10/02/2021 Medical Rec #: [id number]    Accession #:    [id number] Date of Birth: 1932-08-30   Patient Gender: F Patient Age:   79 years Exam Location:  [facility] Procedure:      VAS US  CAROTID Referring Phys: Daneen Schick --------------------------------------------------------------------------------  Indications:       Carotid artery disease follow-up. Patient has dementia and                    late-onset Alzheimers. Risk Factors:      Hypertension, hyperlipidemia, past history of smoking, prior                    MI, prior CVA. Comparison Study:  Previous carotid duplex performed 10/02/20 showed RICA                    velocities of 371/06 cm/sec and LICA velocities of 269/48                    cm/sec. Performing Technologist: Mariane Masters RVT  Examination Guidelines: A complete evaluation includes B-mode imaging, spectral Doppler, color Doppler, and power Doppler as needed of all accessible portions of each vessel. Bilateral testing is considered an integral part of a complete examination. Limited examinations for reoccurring indications may be performed as noted.  Right Carotid Findings: +----------+--------+--------+--------+---------------------+------------------+             PSV cm/s EDV cm/s Stenosis Plaque Description    Comments             +----------+--------+--------+--------+---------------------+------------------+  CCA Prox   38       6                                                           +----------+--------+--------+--------+---------------------+------------------+  CCA Distal 33       8                                                           +----------+--------+--------+--------+---------------------+------------------+  ICA Prox   111      22                heterogenous and      Shadowing                                                  calcific                                  +----------+--------+--------+--------+---------------------+------------------+  ICA Mid    167      26       40-59%                         based on PSV and  plaque              +----------+--------+--------+--------+---------------------+------------------+  ICA Distal 52       13                                                          +----------+--------+--------+--------+---------------------+------------------+  ECA        64       0                                                           +----------+--------+--------+--------+---------------------+------------------+ +----------+--------+-------+----------------+-------------------+             PSV cm/s EDV cms Describe         Arm Pressure (mmHG)  +----------+--------+-------+----------------+-------------------+  Subclavian 118              Multiphasic, WNL                      +----------+--------+-------+----------------+-------------------+ +---------+--------+--+--------+-+---------+  Vertebral PSV cm/s 38 EDV cm/s 7 Antegrade  +---------+--------+--+--------+-+---------+ Heavily calcified plaque with acoustic shadowing prevents full insonation of the proximal ICA; unable to rule out a higher degree of stenosis. Left Carotid Findings: +----------+--------+--------+--------+------------------+--------+             PSV cm/s EDV  cm/s Stenosis Plaque Description Comments  +----------+--------+--------+--------+------------------+--------+  CCA Prox   79       9                                              +----------+--------+--------+--------+------------------+--------+  CCA Distal 45       10                                             +----------+--------+--------+--------+------------------+--------+  ICA Prox   71       18                heterogenous                 +----------+--------+--------+--------+------------------+--------+  ICA Mid    96       23       1-39%                       tortuous  +----------+--------+--------+--------+------------------+--------+  ICA Distal 69       22                                             +----------+--------+--------+--------+------------------+--------+  ECA        74       0                                              +----------+--------+--------+--------+------------------+--------+ +----------+--------+--------+----------------+-------------------+  PSV cm/s EDV cm/s Describe         Arm Pressure (mmHG)  +----------+--------+--------+----------------+-------------------+  Subclavian 119               Multiphasic, WNL                      +----------+--------+--------+----------------+-------------------+ +---------+--------+--+--------+--+---------+  Vertebral PSV cm/s 47 EDV cm/s 12 Antegrade  +---------+--------+--+--------+--+---------+   Summary: Right Carotid: Velocities in the right ICA are consistent with a 40-59%                stenosis, based on PSV and plaque. Unable to rule out a higher                degree of stenosis due to acoustic shadowing from calcified                plaque. Left Carotid: Velocities in the left ICA are consistent with a 1-39% stenosis. Vertebrals:  Bilateral vertebral arteries demonstrate antegrade flow. Subclavians: Normal flow hemodynamics were seen in bilateral subclavian              arteries. *See table(s) above for measurements and  observations. Suggest follow up study in 12 months. Electronically signed by Kathlyn Sacramento MD on 10/03/2021 at 4:39:16 PM.    Final    CT ANGIO HEAD NECK W WO CM (CODE STROKE)  Result Date: 10/23/2021 CLINICAL DATA:  Neuro deficit, left-sided facial droop, left-sided weakness, slurred speech EXAM: CT ANGIOGRAPHY HEAD AND NECK TECHNIQUE: Multidetector CT imaging of the head and neck was performed using the standard protocol during bolus administration of intravenous contrast. Multiplanar CT image reconstructions and MIPs were obtained to evaluate the vascular anatomy. Carotid stenosis measurements (when applicable) are obtained utilizing NASCET criteria, using the distal internal carotid diameter as the denominator. CONTRAST:  65mL OMNIPAQUE IOHEXOL 350 MG/ML SOLN COMPARISON:  11/07/2020 MRA head, no prior CTA, correlation is also made with same day CT head. FINDINGS: CT HEAD FINDINGS For noncontrast findings, please see same day CT head. CTA NECK FINDINGS Aortic arch: Standard branching. Imaged portion shows no evidence of aneurysm or dissection. Aortic atherosclerosis, which extends into the branch vessel origins and causes 50-60% narrowing in the proximal left subclavian artery. The subclavian is otherwise patent. No significant stenosis of the other major arch vessel origins. Right carotid system: 65-70% stenosis of the proximal right ICA, just distal to the bifurcation, secondary to severe calcified and noncalcified plaque. No evidence of dissection. Left carotid system: No evidence of dissection, stenosis (50% or greater) or occlusion. Vertebral arteries: Diminutive right vertebral artery. Left dominant. No evidence of dissection, stenosis (50% or greater) or occlusion. Skeleton: Osteopenia.  No acute osseous abnormality. Other neck: Negative Upper chest: Negative Review of the MIP images confirms the above findings CTA HEAD FINDINGS Anterior circulation: Both internal carotid arteries are patent to the  termini, without stenosis or other abnormality. Focal stenosis at the origin of the right A1 (series 7, image 104). Normal left A1. Normal anterior communicating artery. Anterior cerebral arteries are patent to their distal aspects. Severe focal stenosis in the proximal right M1 (series 7, image 105. Normal left M1. Normal MCA bifurcations. Distal MCA branches perfused, although there is slightly less opacification on the right compared to the left. Posterior circulation: Diminutive right vertebral artery. Vertebral arteries patent to the vertebrobasilar junction without stenosis. Posterior inferior cerebral arteries patent bilaterally. Basilar patent to its distal aspect. Superior cerebellar arteries patent bilaterally. Bilateral P1 segments  originate from the basilar artery. Severe focal stenosis/non opacification in the left P1, although there is a patent left posterior communicating artery. Normal right P1. No right posterior communicating artery. Severe stenosis is also noted in the proximal left P2, with multifocal narrowing in the distal left P2. The right PCA is perfused to its distal aspects without stenosis. Venous sinuses: Minimally opacified. As permitted by contrast timing, patent. Anatomic variants: None significant Review of the MIP images confirms the above findings IMPRESSION: 1. Focal non opacification of the left P1. Severe focal stenosis in the proximal right M1, right A1, and left P2. 2. 65-70% stenosis in the proximal right ICA, secondary to calcified and noncalcified plaque. No other hemodynamically significant stenosis in the neck. 3. 50-60% stenosis in the proximal left subclavian artery, secondary to calcified plaque. Electronically Signed   By: Merilyn Baba M.D.   On: 10/23/2021 18:15      HISTORY OF PRESENT ILLNESS Patient with a history of HTN, HLD, dementia and NSTEMI presented with left sided weakness, slurred speech and vision changes.   HOSPITAL COURSE Patient was given TNK on  arrival to the ED, and her symptoms rapidly improved.  MRI later revealed no acute or subacute infarct.  Patient was found to have 65-70% stenosis of her right carotid artery.  This was discussed with her daughters, who do not wish to have intervention performed at this time but will follow up with her outpatient providers.  She will receive home health PT an OT services.  Patient will take Brillinta and aspirin for one month followed by Plavix and aspirin indefinitely.  She will take Pravastatin and Zetia to lower her LDL.    TIA - likely due to multifocal extracranial and intracranial stenosis Code Stroke CT head Remote right cerebellar infarct ASPECTS 10.    CTA head & neck Focal non opacification of left P1, severe focal stenosis in right M1, right A1 and left P2, 65-70% stenosis in proximal right ICA MRI no acute infarct Carotis doppler right ICA 40-59% stenosis, left ICA 1-39% stenosis 2D Echo EF 55-60%, no atrial level shunt LDL 135 HgbA1c 5.9 VTE prophylaxis - SCDs aspirin 81 mg daily and clopidogrel 75 mg daily prior to admission, now on aspirin 81 and Brilinta 90 twice daily for 3 days and then back to aspirin and Plavix. Therapy recommendations: Home health PT/OT Disposition: Home today   Carotid stenosis CTA head neck right ICA bulb at least 65 to 70% stenosis Carotid Doppler 10/02/2021 showed right ICA 40 to 59% stenosis. Patient presented with transient left-sided weakness, right ICA stenosis could be symptomatic. Patient discussed with interventional radiology Dr. Norma Fredrickson, no emergent carotid revascularization needed at this time but will follow-up with Dr. Norma Fredrickson as outpatient.  Hypertension Home meds:  losartan 50 mg daily Stable Keep BP <180/105 Long-term BP goal normotensive   Hyperlipidemia Home meds:  none LDL 135, goal < 70 Family against statin at this time, will add Zetia 10.   Continue Zetia at discharge    Other Stroke Risk Factors Advanced Age >/= 41   Former cigarette smoker Obesity, Body mass index is 30.4 kg/m., BMI >/= 30 associated with increased stroke risk, recommend weight loss, diet and exercise as appropriate  Coronary artery disease   Other Active Problems Dementia  RN Pressure Injury Documentation:     DISCHARGE EXAM Blood pressure 113/62, pulse 72, temperature 97.8 F (36.6 C), temperature source Oral, resp. rate 14, height 5\' 2"  (1.575 m), weight 75.4 kg, SpO2 97 %.  PHYSICAL EXAM General:  Alert, well developed, well nourished patient in no acute distress     NEURO:  Mental Status: AA&Ox1 Speech/Language: speech is without dysarthria or aphasia.  Naming, repetition, fluency, and comprehension intact.   Cranial Nerves:  II: PERRL. Visual fields full.  III, IV, VI: EOMI. Eyelids elevate symmetrically.  V: Sensation is intact to light touch and symmetrical to face.  VII: Smile is symmetrical.  VIII: hearing intact to voice. IX, X: Phonation is normal.  XII: tongue is midline without fasciculations. Motor: 5/5 strength to all muscle groups tested.  Tone: is normal and bulk is normal Sensation- Intact to light touch bilaterally. Extinction absent to light touch to DSS.   Coordination: FTN intact bilaterally.No drift.  Gait- deferred  Discharge Diet       Diet   Diet Heart Room service appropriate? Yes; Fluid consistency: Thin   liquids  DISCHARGE PLAN Disposition:  to home with daughters aspirin 81 mg daily and Brilinta (ticagrelor) 90 mg bid for secondary stroke prevention for 1 month then aspiring and Plavix. Ongoing stroke risk factor control by Primary Care Physician at time of discharge Follow-up PCP Philmore Pali, NP in 2 weeks. Follow-up in [facility] in 4 weeks, office to schedule an appointment.   35 minutes were spent preparing discharge.  Shepherd , MSN, AGACNP-BC [facility] See Amion for schedule and pager  information 10/25/2021 4:27 PM  ATTENDING NOTE: I reviewed above note and agree with the assessment and plan. Pt was seen and examined.   No acute event overnight, patient neurology was stable, no focal deficit.  MRI no acute infarct.  Family discussed with Dr. Norma Fredrickson regarding right parotid ICA stenosis, no emergent procedure needed at this time, but she will follow-up with Dr. Norma Fredrickson as outpatient.  Family still hesitant with a statin, will start Zetia 10 for HLD.  PT/OT recommend home health PT/OT.  Patient will be discharged today.  Follow-up as outpatient in 4 weeks.  For detailed assessment and plan, please refer to above as I have made changes wherever appropriate.   Rosalin Hawking, MD PhD Stroke Neurology 10/25/2021 7:16 PM

## 2021-10-25 NOTE — TOC CAGE-AID Note (Signed)
Transition of Care [facility]) - CAGE-AID Screening   Patient Details  Name: Katelyn Powers MRN: [id number] Date of Birth: 10-Feb-1932  Transition of Care [facility]) CM/SW Contact:    Thurmon Mizell C Tarpley-Carter, [place] Phone Number: 10/25/2021, 11:41 AM   Clinical Narrative: Pt is unable to participate in Cage Aid.  Pt is experiencing dementia.  Coretha Creswell Tarpley-Carter, MSW, LCSW-A Pronouns:  She/Her/Hers [place] Transitions of Care Clinical Social Worker Direct Number:  [phone] [email or web address]  CAGE-AID Screening: Substance Abuse Screening unable to be completed due to: : Patient unable to participate (Pt is experiencing dementia.)             Substance Abuse Education Offered: No

## 2021-10-25 NOTE — Telephone Encounter (Signed)
[name] daughter has called stating pt is in [place] there are wanting to put pt back on Lipitor or Crestor which daughter states pt has not good results with.  Daughter states this is a time sensitive matter and she would like a call from RN or Dr Felecia Shelling to discuss her concerns.

## 2021-10-25 NOTE — Telephone Encounter (Signed)
Spoke with daughter and she states that there was talk of the possibility of needing carotid stents from the doctors at the hospital.  Daughter was concerned since we recently did carotid doppler and they were stable.  Reviewed pt's chart and CT showed 65-70% stenosis on right side, which is worse than what the doppler showed.  Explained to the daughter that this may be where that recommendation was coming from.  Explained the different imaging and why they can show different numbers.  Encouraged her to at least follow through with a consult if they feel pt needs to see a vascular surgeon.  She also questioned their recommendation for pt to be on a traditional statin along with Zetia.  Explained the reasoning behind this.  She mentioned that pt has tried Atorvastatin and Rosuvastatin before and had myalgias.  Advised her to make sure she mentions this to the doctor before discharge as there are other options for them to try.  She said they mentioned one that started with a "P".  Told her that was possibly Pravastatin but to talk to the hospital MD about this for clarification and again, encouraged her to mention previous issues with statins so they can make a proper decision on care.  [name] was in agreement with plan and thankful for the call.

## 2021-10-25 NOTE — Telephone Encounter (Addendum)
Reviewed hospital notes. Note from Dr. Erlinda Hong 10/24/21: "Will continue aspirin and Plavix for now, may consider Brilinta if family would like to proceed. Add Lipitor 40 after discussed with daughter"  I called daughter, she placed me on hold because she was currently on the phone. She got back on the line and asked I call home # at [phone]. [employer] has bad service.   Mother admitted for stroke at [facility] 10/23/21. Still admitted. MRI brain completed (in epic). Daughter states it showed increased stenosis in brain. Had doppler study a few weeks ago and was stable. Mother back to baseline now. They did repeat carotid doppler while in hospital this week and found right side had increased stenosis, recommending stent placement. Talking about putting her on a statin in addition to plavix/asa. Daughter does not agree. States Lipitor/Crestor caused SE in past (joint pain, swelling). Does not feel Zetia would be enough at this time and recommending statin. Recommending Pravastatin w/ Zetia.   She called Dr. Thompson Caul office (cardiology). Prescribed Zetia a year ago but they stopped and were trying more of a holistic approach. Placed on supplement to take the place of zetia/ok'd by [place]. She was doing well, stable.  Does not want to do any dementia medications d/t SE (ie-donepezil). Taking more naturalistic approaches for this.

## 2021-10-26 NOTE — Telephone Encounter (Signed)
I spoke to Ms. Gieske's daughter, Lynelle Smoke.  I reviewed her recent hospitalization, imaging and notes from Dr. Erlinda Hong.  From the discharge summary, emergent recanalization of the right internal carotid artery was discussed but the family opted to maximize medical therapy.  Therefore, she will be on aspirin plus Brilinta for 30 days and then change over to aspirin 81 mg p.o. Plavix 75 mg indefinitely.  It is recommended that she follow-up with Dr. Norma Fredrickson.

## 2021-10-31 ENCOUNTER — Other Ambulatory Visit (HOSPITAL_COMMUNITY): Payer: Self-pay | Admitting: Interventional Cardiology

## 2021-10-31 DIAGNOSIS — I6523 Occlusion and stenosis of bilateral carotid arteries: Secondary | ICD-10-CM

## 2021-11-02 DIAGNOSIS — G459 Transient cerebral ischemic attack, unspecified: Secondary | ICD-10-CM | POA: Diagnosis not present

## 2021-11-20 ENCOUNTER — Other Ambulatory Visit: Payer: Self-pay

## 2021-11-20 MED ORDER — EZETIMIBE 10 MG PO TABS: 10.0000 mg | ORAL_TABLET | Freq: Every day | ORAL | 0 refills | Status: DC

## 2021-11-20 NOTE — Telephone Encounter (Signed)
Left message for the patient to contact the office for a follow up appointment; for additional refills.   Rx(s) sent to pharmacy electronically.

## 2021-12-14 DIAGNOSIS — E785 Hyperlipidemia, unspecified: Secondary | ICD-10-CM | POA: Diagnosis not present

## 2021-12-14 DIAGNOSIS — Z8673 Personal history of transient ischemic attack (TIA), and cerebral infarction without residual deficits: Secondary | ICD-10-CM | POA: Diagnosis not present

## 2021-12-14 DIAGNOSIS — I10 Essential (primary) hypertension: Secondary | ICD-10-CM | POA: Diagnosis not present

## 2021-12-14 DIAGNOSIS — I2510 Atherosclerotic heart disease of native coronary artery without angina pectoris: Secondary | ICD-10-CM | POA: Diagnosis not present

## 2021-12-14 DIAGNOSIS — G301 Alzheimer's disease with late onset: Secondary | ICD-10-CM | POA: Diagnosis not present

## 2021-12-14 DIAGNOSIS — I6521 Occlusion and stenosis of right carotid artery: Secondary | ICD-10-CM | POA: Diagnosis not present

## 2021-12-14 DIAGNOSIS — E119 Type 2 diabetes mellitus without complications: Secondary | ICD-10-CM | POA: Diagnosis not present

## 2021-12-20 ENCOUNTER — Telehealth (HOSPITAL_COMMUNITY): Payer: Self-pay

## 2021-12-20 NOTE — Telephone Encounter (Signed)
Called to schedule consult with Dr. Estanislado Pandy at pt's request, no answer, left vm. AW

## 2022-01-09 DIAGNOSIS — N6459 Other signs and symptoms in breast: Secondary | ICD-10-CM | POA: Diagnosis not present

## 2022-01-09 DIAGNOSIS — N63 Unspecified lump in unspecified breast: Secondary | ICD-10-CM | POA: Diagnosis not present

## 2022-01-10 ENCOUNTER — Other Ambulatory Visit: Payer: Self-pay | Admitting: Physician Assistant

## 2022-01-14 ENCOUNTER — Encounter: Payer: Self-pay | Admitting: Neurology

## 2022-01-14 ENCOUNTER — Ambulatory Visit: Payer: Medicare Other | Admitting: Neurology

## 2022-01-14 VITALS — BP 118/72 | HR 81 | Ht 62.5 in | Wt 158.0 lb

## 2022-01-14 DIAGNOSIS — G459 Transient cerebral ischemic attack, unspecified: Secondary | ICD-10-CM | POA: Diagnosis not present

## 2022-01-14 DIAGNOSIS — G301 Alzheimer's disease with late onset: Secondary | ICD-10-CM

## 2022-01-14 DIAGNOSIS — I6521 Occlusion and stenosis of right carotid artery: Secondary | ICD-10-CM | POA: Diagnosis not present

## 2022-01-14 DIAGNOSIS — I669 Occlusion and stenosis of unspecified cerebral artery: Secondary | ICD-10-CM | POA: Diagnosis not present

## 2022-01-14 DIAGNOSIS — F0280 Dementia in other diseases classified elsewhere without behavioral disturbance: Secondary | ICD-10-CM

## 2022-01-14 NOTE — Progress Notes (Signed)
? ?[facility] ? ?PATIENT: Katelyn Powers ?DOB: Oct 16, 1932 ? ?REFERRING DOCTOR OR PCP:  Dr. Vanetta Shawl ?SOURCE: patient, notes from PCP and ED,  Imaging and lab reports, MRi and CT images on PACS personally reviewed.   ? ?_________________________________ ? ? ?HISTORICAL ? ?CHIEF COMPLAINT:  ?Chief Complaint  ?Patient presents with  ? Follow-up  ?  Pt with daughter, rm 1. Had a stroke and she received the TPA. Following up post hospital. She is back to baseline receiving TPA. Since then there has been more noticeable problems with expressive aphasia. Memory there has been some decline  ? ? ?HISTORY OF PRESENT ILLNESS:  ?Katelyn Powers is a 88 y.o. woman with memory difficulties and gait issues ? ? ?Update 01/14/2022: ?She was hospitalized in December with a possible stroke - slurred speech and then dysconjugate gaze.   She became unconscious and was taken to the ED.    CT showed old ischemic changes but none acute.    CTA showed severe stenosis of the left P1, right M1, right A1, left P2 and 65-70% stenosis of the proximal right ICA.    She received TNK in the ED and uickly improved.   MRI followup shoed no acute stroke.  She continues have slow progression of her Alzheimer's disease and related symptoms ? ?She is on Zetia (new), Plavix, ASAand a pravastatin.    She had already been on ASA, plavix. . ? ?She takes donepezil with melatonin and the combination is helping her sleep better.     She often wakes up at 3 am but is getting falls asleep at 9-10 pm and gets 5-6 solid hours of sleep.  She naps multiple sort naps during the day (adds up to 2 hours/day)  She snores but has no gasping/OSA.   She has lost some weight and snores less.     ? ?At home, she has good behavior though sometimes refuses to change into her bed clothes.  She falls asleep well after she takes melatonin.   She will take occasional short naps.   She has had more incontinence.   She wears depends under garments.   She was hospitalized  10/23/2022 with a stoke .  She had agitiation and did not sleep much.    ? ?She notes more pain in her left hip.   She is active and is doing more gardening with the improved weather.    ? ?She is living with her daughters (rotates between the two).     ? ?Imaging: ?MRI from 10/24/2021 was compared to the MRI from 11/07/2020.  The brain shows moderate generalized cortical atrophy, most pronounced in the medial temporal lobes.  There is a small chronic left parietal stroke a chronic right frontal stroke, a left medial cerebellar hemisphere lacunar and scattered T2/FLAIR hyperintense foci consistent with chronic microvascular ischemic change.  There were no acute ischemic findings. ? ?CT angiogram 10/23/2021 showed severe stenosis of the left P1, right M1, right A1, left P2 and 65-70% stenosis of the proximal right ICA ? ?The carotid Doppler study 07/22/2018 showed 60 to 79% stenosis on the right and no significant stenosis on the left. ? ? ?EPWORTH SLEEPINESS SCALE ? ?On a scale of 0 - 3 what is the chance of dozing: ? ?Sitting and Reading:   3 ?Watching TV:    1 ?Sitting inactive in a public place: 0 ?Passenger in car for one hour: 3 ?Lying down to rest in the afternoon: 0 ?Sitting and talking to  someone: 0 ?Sitting quietly after lunch:  3 ?In a car, stopped in traffic:  0 ? ?Total (out of 24):  10/24 (mild excessive sleepiness) ? ?Montreal Cognitive Assessment  10/27/2019 10/27/2019 07/27/2018  ?Visuospatial/ Executive (0/5) '3 3 3  '$ ?Naming (0/3) '2 2 2  '$ ?Attention: Read list of digits (0/2) '2 2 1  '$ ?Attention: Read list of letters (0/1) '1 1 1  '$ ?Attention: Serial 7 subtraction starting at 100 (0/3) '1 1 3  '$ ?Language: Repeat phrase (0/2) '2 2 1  '$ ?Language : Fluency (0/1) '1 1 1  '$ ?Abstraction (0/2) 0 0 2  ?Delayed Recall (0/5) 0 0 0  ?Orientation (0/6) '5 5 5  '$ ?Total '17 17 19  '$ ?Adjusted Score (based on education) 18 - 20  ? ? ? ?REVIEW OF SYSTEMS: ?Constitutional: No fevers, chills, sweats, or change in appetite ?Eyes: No  visual changes, double vision, eye pain ?Ear, nose and throat: No hearing loss, ear pain, nasal congestion, sore throat ?Cardiovascular: No chest pain, palpitations ?Respiratory:  No shortness of breath at rest or with exertion.   No wheezes ?GastrointestinaI: No nausea, vomiting, diarrhea, abdominal pain, fecal incontinence ?Genitourinary:  No dysuria, urinary retention or frequency.  No nocturia. ?Musculoskeletal:  No neck pain, back pain ?Integumentary: No rash, pruritus, skin lesions ?Neurological: as above ?Psychiatric: No depression at this time.  No anxiety. ?Endocrine: No palpitations, diaphoresis, change in appetite, change in weigh or increased thirst ?Hematologic/Lymphatic:  No anemia, purpura, petechiae. ?Allergic/Immunologic: No itchy/runny eyes, nasal congestion, recent allergic reactions, rashes ? ?ALLERGIES: ?No Known Allergies ? ?HOME MEDICATIONS: ? ?Current Outpatient Medications:  ?  amLODipine (NORVASC) 5 MG tablet, Take 1 tablet (5 mg total) by mouth daily., Disp: , Rfl:  ?  aspirin EC 81 MG EC tablet, Take 1 tablet (81 mg total) by mouth daily. Swallow whole., Disp: 30 tablet, Rfl: 11 ?  Cholecalciferol (VITAMIN D3 PO), Take 1 capsule by mouth daily. Vit D3 (4,000U), Vit A, K2+ 4 immune boosters- Take 1 capsule by mouth daily, Disp: , Rfl:  ?  ezetimibe (ZETIA) 10 MG tablet, Take 1 tablet (10 mg total) by mouth daily., Disp: 90 tablet, Rfl: 0 ?  Ginkgo Biloba 120 MG CAPS, Take 120 mg by mouth in the morning., Disp: , Rfl:  ?  losartan (COZAAR) 50 MG tablet, Take 1 tablet (50 mg total) by mouth daily., Disp: , Rfl:  ?  Melatonin 10 MG TABS, Take 10 mg by mouth at bedtime. NEEDS NIGHTLY TO SLEEP OR WILL BE RESTLESS, Disp: , Rfl:  ?  nitroGLYCERIN (NITROSTAT) 0.4 MG SL tablet, Place 1 tablet (0.4 mg total) under the tongue every 5 (five) minutes as needed for chest pain., Disp: 25 tablet, Rfl: 2 ?  NON FORMULARY, Take 2 tablets by mouth See admin instructions. [employer] Mushroom tablets- Take 2  tablets by mouth daily with lunch, Disp: , Rfl:  ?  NON FORMULARY, Take 2 tablets by mouth See admin instructions. Real Mushrooms Reishi Mushroom Extract tablets- Take 2 tablets by mouth daily with lunch, Disp: , Rfl:  ?  NON FORMULARY, Take 2 capsules by mouth See admin instructions. [employer] Daily Healthy Cholesterol Support capsules- Take 2 capsules by mouth at bedtime, Disp: , Rfl:  ?  Omega-3-acid Eth &Multivit/Min (OMEGA-3 RX COMPLETE PO), Take 1 capsule by mouth at bedtime., Disp: , Rfl:  ?  pravastatin (PRAVACHOL) 40 MG tablet, Take 1 tablet (40 mg total) by mouth daily at 6 PM., Disp: 30 tablet, Rfl: 1 ? ?PAST MEDICAL HISTORY: ?  Past Medical History:  ?Diagnosis Date  ? Cataract   ? Gout   ? Hyperlipidemia   ? Hypertension   ? Memory loss   ? NSTEMI (non-ST elevated myocardial infarction) ([place]) 08/2020  ? Right-sided carotid artery disease ([place]) 09/17/2020  ? carotid doppler wth >60% occlusion  ? ? ?PAST SURGICAL HISTORY: ?Past Surgical History:  ?Procedure Laterality Date  ? MELANOMA EXCISION Right 10/28/1970  ? level 2  ? 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 TUBAL LIGATION    ? ? ?FAMILY HISTORY: ?Family History  ?Problem Relation Age of Onset  ? Colon cancer Sister 59  ? Colon polyps Brother   ? ? ?SOCIAL HISTORY: ? ?Social History  ? ?Socioeconomic History  ? Marital status: Widowed  ?  Spouse name: Not on file  ? Number of children: Not on file  ? Years of education: Not on file  ? Highest education level: Not on file  ?Occupational History  ? Not on file  ?Tobacco Use  ? Smoking status: Former  ? Smokeless tobacco: Never  ?Vaping Use  ? Vaping Use: Never used  ?Substance and Sexual Activity  ? Alcohol use: No  ?  Alcohol/week: 0.0 standard drinks  ? Drug use: No  ? Sexual activity: Not on file  ?Other Topics Concern  ? Not on file  ?Social History Narrative  ? Not on file  ? ?Social Determinants of Health  ? ?Financial Resource Strain: Not on file  ?Food  Insecurity: Not on file  ?Transportation Needs: Not on file  ?Physical Activity: Not on file  ?Stress: Not on file  ?Social Connections: Not on file  ?Intimate Partner Violence: Not on file  ? ? ? ?PHYSICAL EXAM ?

## 2022-01-15 ENCOUNTER — Other Ambulatory Visit: Payer: Self-pay | Admitting: Interventional Cardiology

## 2022-01-25 ENCOUNTER — Ambulatory Visit
Admission: RE | Admit: 2022-01-25 | Discharge: 2022-01-25 | Disposition: A | Discharge status: Home / Self Care | Payer: Medicare Other | Source: Ambulatory Visit | Attending: Physician Assistant | Admitting: Physician Assistant

## 2022-01-25 DIAGNOSIS — R234 Changes in skin texture: Secondary | ICD-10-CM | POA: Insufficient documentation

## 2022-01-25 DIAGNOSIS — Z1231 Encounter for screening mammogram for malignant neoplasm of breast: Secondary | ICD-10-CM | POA: Insufficient documentation

## 2022-01-25 DIAGNOSIS — F0390 Unspecified dementia without behavioral disturbance: Secondary | ICD-10-CM | POA: Insufficient documentation

## 2022-01-25 DIAGNOSIS — N6459 Other signs and symptoms in breast: Secondary | ICD-10-CM | POA: Diagnosis not present

## 2022-01-25 DIAGNOSIS — R922 Inconclusive mammogram: Secondary | ICD-10-CM | POA: Diagnosis not present

## 2022-01-25 DIAGNOSIS — N63 Unspecified lump in unspecified breast: Secondary | ICD-10-CM | POA: Insufficient documentation

## 2022-02-01 ENCOUNTER — Other Ambulatory Visit: Payer: Self-pay

## 2022-02-01 NOTE — Patient Outreach (Signed)
[facility]) Care Management ? ?02/01/2022 ? ?Katelyn Powers ?08/08/1932 ?[id number] ? ? ?Telephone outreach to patient to obtain mRS was successfully completed. MRS= 3 ? ?Philmore Pali ?[place] Management Assistant ?[phone] ? ? ?

## 2022-02-20 DIAGNOSIS — E785 Hyperlipidemia, unspecified: Secondary | ICD-10-CM | POA: Diagnosis not present

## 2022-02-20 DIAGNOSIS — E119 Type 2 diabetes mellitus without complications: Secondary | ICD-10-CM | POA: Diagnosis not present

## 2022-02-20 DIAGNOSIS — N1831 Chronic kidney disease, stage 3a: Secondary | ICD-10-CM | POA: Diagnosis not present

## 2022-02-20 DIAGNOSIS — G301 Alzheimer's disease with late onset: Secondary | ICD-10-CM | POA: Diagnosis not present

## 2022-02-20 DIAGNOSIS — I2510 Atherosclerotic heart disease of native coronary artery without angina pectoris: Secondary | ICD-10-CM | POA: Diagnosis not present

## 2022-02-20 DIAGNOSIS — Z8673 Personal history of transient ischemic attack (TIA), and cerebral infarction without residual deficits: Secondary | ICD-10-CM | POA: Diagnosis not present

## 2022-02-20 DIAGNOSIS — I6521 Occlusion and stenosis of right carotid artery: Secondary | ICD-10-CM | POA: Diagnosis not present

## 2022-02-20 DIAGNOSIS — I10 Essential (primary) hypertension: Secondary | ICD-10-CM | POA: Diagnosis not present

## 2022-02-25 ENCOUNTER — Telehealth: Payer: Medicare Other | Admitting: Family Medicine

## 2022-03-13 ENCOUNTER — Ambulatory Visit: Payer: Medicare Other | Admitting: Neurology

## 2022-05-25 ENCOUNTER — Emergency Department (HOSPITAL_COMMUNITY): Payer: Medicare Other

## 2022-05-25 ENCOUNTER — Encounter (HOSPITAL_COMMUNITY): Payer: Self-pay

## 2022-05-25 ENCOUNTER — Other Ambulatory Visit: Payer: Self-pay

## 2022-05-25 ENCOUNTER — Inpatient Hospital Stay (HOSPITAL_COMMUNITY)
Admission: EM | Admit: 2022-05-25 | Discharge: 2022-05-28 | DRG: 065 | Disposition: A | Discharge status: Rehabilitation Facility | Payer: Medicare Other | Attending: Family Medicine | Admitting: Family Medicine

## 2022-05-25 DIAGNOSIS — R7989 Other specified abnormal findings of blood chemistry: Secondary | ICD-10-CM | POA: Diagnosis not present

## 2022-05-25 DIAGNOSIS — I6613 Occlusion and stenosis of bilateral anterior cerebral arteries: Secondary | ICD-10-CM | POA: Diagnosis not present

## 2022-05-25 DIAGNOSIS — H5340 Unspecified visual field defects: Secondary | ICD-10-CM | POA: Diagnosis present

## 2022-05-25 DIAGNOSIS — F0280 Dementia in other diseases classified elsewhere without behavioral disturbance: Secondary | ICD-10-CM | POA: Diagnosis not present

## 2022-05-25 DIAGNOSIS — F02818 Dementia in other diseases classified elsewhere, unspecified severity, with other behavioral disturbance: Secondary | ICD-10-CM | POA: Diagnosis present

## 2022-05-25 DIAGNOSIS — R778 Other specified abnormalities of plasma proteins: Secondary | ICD-10-CM | POA: Diagnosis not present

## 2022-05-25 DIAGNOSIS — F02B0 Dementia in other diseases classified elsewhere, moderate, without behavioral disturbance, psychotic disturbance, mood disturbance, and anxiety: Secondary | ICD-10-CM | POA: Diagnosis not present

## 2022-05-25 DIAGNOSIS — I700 Atherosclerosis of aorta: Secondary | ICD-10-CM | POA: Diagnosis present

## 2022-05-25 DIAGNOSIS — Z85820 Personal history of malignant melanoma of skin: Secondary | ICD-10-CM | POA: Diagnosis not present

## 2022-05-25 DIAGNOSIS — G4700 Insomnia, unspecified: Secondary | ICD-10-CM | POA: Diagnosis not present

## 2022-05-25 DIAGNOSIS — Z7189 Other specified counseling: Secondary | ICD-10-CM | POA: Diagnosis not present

## 2022-05-25 DIAGNOSIS — Z7982 Long term (current) use of aspirin: Secondary | ICD-10-CM | POA: Diagnosis not present

## 2022-05-25 DIAGNOSIS — Z79899 Other long term (current) drug therapy: Secondary | ICD-10-CM

## 2022-05-25 DIAGNOSIS — I5032 Chronic diastolic (congestive) heart failure: Secondary | ICD-10-CM | POA: Diagnosis present

## 2022-05-25 DIAGNOSIS — M109 Gout, unspecified: Secondary | ICD-10-CM | POA: Diagnosis present

## 2022-05-25 DIAGNOSIS — D72829 Elevated white blood cell count, unspecified: Secondary | ICD-10-CM | POA: Diagnosis not present

## 2022-05-25 DIAGNOSIS — I2510 Atherosclerotic heart disease of native coronary artery without angina pectoris: Secondary | ICD-10-CM | POA: Diagnosis not present

## 2022-05-25 DIAGNOSIS — Z9851 Tubal ligation status: Secondary | ICD-10-CM

## 2022-05-25 DIAGNOSIS — I350 Nonrheumatic aortic (valve) stenosis: Secondary | ICD-10-CM | POA: Diagnosis not present

## 2022-05-25 DIAGNOSIS — Z87891 Personal history of nicotine dependence: Secondary | ICD-10-CM

## 2022-05-25 DIAGNOSIS — I69354 Hemiplegia and hemiparesis following cerebral infarction affecting left non-dominant side: Secondary | ICD-10-CM | POA: Diagnosis not present

## 2022-05-25 DIAGNOSIS — R4701 Aphasia: Secondary | ICD-10-CM | POA: Diagnosis not present

## 2022-05-25 DIAGNOSIS — Z20822 Contact with and (suspected) exposure to covid-19: Secondary | ICD-10-CM | POA: Diagnosis not present

## 2022-05-25 DIAGNOSIS — I6389 Other cerebral infarction: Principal | ICD-10-CM | POA: Diagnosis present

## 2022-05-25 DIAGNOSIS — I6611 Occlusion and stenosis of right anterior cerebral artery: Secondary | ICD-10-CM | POA: Diagnosis not present

## 2022-05-25 DIAGNOSIS — G319 Degenerative disease of nervous system, unspecified: Secondary | ICD-10-CM | POA: Diagnosis not present

## 2022-05-25 DIAGNOSIS — I10 Essential (primary) hypertension: Secondary | ICD-10-CM | POA: Diagnosis not present

## 2022-05-25 DIAGNOSIS — Q283 Other malformations of cerebral vessels: Secondary | ICD-10-CM | POA: Diagnosis not present

## 2022-05-25 DIAGNOSIS — I6381 Other cerebral infarction due to occlusion or stenosis of small artery: Secondary | ICD-10-CM | POA: Diagnosis not present

## 2022-05-25 DIAGNOSIS — I639 Cerebral infarction, unspecified: Secondary | ICD-10-CM | POA: Diagnosis not present

## 2022-05-25 DIAGNOSIS — E782 Mixed hyperlipidemia: Secondary | ICD-10-CM | POA: Diagnosis not present

## 2022-05-25 DIAGNOSIS — Z515 Encounter for palliative care: Secondary | ICD-10-CM | POA: Diagnosis not present

## 2022-05-25 DIAGNOSIS — Z743 Need for continuous supervision: Secondary | ICD-10-CM | POA: Diagnosis not present

## 2022-05-25 DIAGNOSIS — I252 Old myocardial infarction: Secondary | ICD-10-CM | POA: Diagnosis not present

## 2022-05-25 DIAGNOSIS — I110 Hypertensive heart disease with heart failure: Secondary | ICD-10-CM | POA: Diagnosis present

## 2022-05-25 DIAGNOSIS — G309 Alzheimer's disease, unspecified: Secondary | ICD-10-CM | POA: Diagnosis not present

## 2022-05-25 DIAGNOSIS — R531 Weakness: Secondary | ICD-10-CM

## 2022-05-25 DIAGNOSIS — I6601 Occlusion and stenosis of right middle cerebral artery: Secondary | ICD-10-CM | POA: Diagnosis not present

## 2022-05-25 DIAGNOSIS — I771 Stricture of artery: Secondary | ICD-10-CM | POA: Diagnosis not present

## 2022-05-25 DIAGNOSIS — R29708 NIHSS score 8: Secondary | ICD-10-CM | POA: Diagnosis present

## 2022-05-25 DIAGNOSIS — I6521 Occlusion and stenosis of right carotid artery: Secondary | ICD-10-CM | POA: Diagnosis present

## 2022-05-25 DIAGNOSIS — G301 Alzheimer's disease with late onset: Secondary | ICD-10-CM

## 2022-05-25 DIAGNOSIS — R32 Unspecified urinary incontinence: Secondary | ICD-10-CM | POA: Diagnosis not present

## 2022-05-25 DIAGNOSIS — R339 Retention of urine, unspecified: Secondary | ICD-10-CM | POA: Diagnosis not present

## 2022-05-25 DIAGNOSIS — I6622 Occlusion and stenosis of left posterior cerebral artery: Secondary | ICD-10-CM | POA: Diagnosis not present

## 2022-05-25 DIAGNOSIS — R29898 Other symptoms and signs involving the musculoskeletal system: Secondary | ICD-10-CM | POA: Diagnosis not present

## 2022-05-25 DIAGNOSIS — I6503 Occlusion and stenosis of bilateral vertebral arteries: Secondary | ICD-10-CM | POA: Diagnosis not present

## 2022-05-25 DIAGNOSIS — E785 Hyperlipidemia, unspecified: Secondary | ICD-10-CM | POA: Diagnosis not present

## 2022-05-25 DIAGNOSIS — F02B2 Dementia in other diseases classified elsewhere, moderate, with psychotic disturbance: Secondary | ICD-10-CM | POA: Diagnosis not present

## 2022-05-25 DIAGNOSIS — N3942 Incontinence without sensory awareness: Secondary | ICD-10-CM | POA: Diagnosis not present

## 2022-05-25 LAB — I-STAT CHEM 8, ED
BUN: 29 mg/dL — ABNORMAL HIGH (ref 8–23)
Calcium, Ion: 0.94 mmol/L — ABNORMAL LOW (ref 1.15–1.40)
Chloride: 109 mmol/L (ref 98–111)
Creatinine, Ser: 1 mg/dL (ref 0.44–1.00)
Glucose, Bld: 96 mg/dL (ref 70–99)
HCT: 41 % (ref 36.0–46.0)
Hemoglobin: 13.9 g/dL (ref 12.0–15.0)
Potassium: 4.7 mmol/L (ref 3.5–5.1)
Sodium: 136 mmol/L (ref 135–145)
TCO2: 23 mmol/L (ref 22–32)

## 2022-05-25 LAB — COMPREHENSIVE METABOLIC PANEL
ALT: 14 U/L (ref 0–44)
AST: 19 U/L (ref 15–41)
Albumin: 3.8 g/dL (ref 3.5–5.0)
Alkaline Phosphatase: 78 U/L (ref 38–126)
Anion gap: 11 (ref 5–15)
BUN: 21 mg/dL (ref 8–23)
CO2: 22 mmol/L (ref 22–32)
Calcium: 9.8 mg/dL (ref 8.9–10.3)
Chloride: 106 mmol/L (ref 98–111)
Creatinine, Ser: 0.98 mg/dL (ref 0.44–1.00)
GFR, Estimated: 56 mL/min — ABNORMAL LOW (ref >60)
Glucose, Bld: 111 mg/dL — ABNORMAL HIGH (ref 70–99)
Potassium: 4 mmol/L (ref 3.5–5.1)
Sodium: 139 mmol/L (ref 135–145)
Total Bilirubin: 1.4 mg/dL — ABNORMAL HIGH (ref 0.3–1.2)
Total Protein: 7.4 g/dL (ref 6.5–8.1)

## 2022-05-25 LAB — CBG MONITORING, ED: Glucose-Capillary: 100 mg/dL — ABNORMAL HIGH (ref 70–99)

## 2022-05-25 LAB — DIFFERENTIAL
Abs Immature Granulocytes: 0.1 10*3/uL — ABNORMAL HIGH (ref 0.00–0.07)
Basophils Absolute: 0.1 10*3/uL (ref 0.0–0.1)
Basophils Relative: 1 %
Eosinophils Absolute: 0.2 10*3/uL (ref 0.0–0.5)
Eosinophils Relative: 2 %
Immature Granulocytes: 1 %
Lymphocytes Relative: 20 %
Lymphs Abs: 2 10*3/uL (ref 0.7–4.0)
Monocytes Absolute: 0.7 10*3/uL (ref 0.1–1.0)
Monocytes Relative: 7 %
Neutro Abs: 7.1 10*3/uL (ref 1.7–7.7)
Neutrophils Relative %: 69 %

## 2022-05-25 LAB — TROPONIN I (HIGH SENSITIVITY): Troponin I (High Sensitivity): 40 ng/L — ABNORMAL HIGH (ref <18)

## 2022-05-25 LAB — PROTIME-INR
INR: 1 (ref 0.8–1.2)
Prothrombin Time: 13.4 seconds (ref 11.4–15.2)

## 2022-05-25 LAB — CBC
HCT: 42.9 % (ref 36.0–46.0)
Hemoglobin: 14.2 g/dL (ref 12.0–15.0)
MCH: 29.9 pg (ref 26.0–34.0)
MCHC: 33.1 g/dL (ref 30.0–36.0)
MCV: 90.3 fL (ref 80.0–100.0)
Platelets: 236 10*3/uL (ref 150–400)
RBC: 4.75 MIL/uL (ref 3.87–5.11)
RDW: 13.5 % (ref 11.5–15.5)
WBC: 10.1 10*3/uL (ref 4.0–10.5)
nRBC: 0 % (ref 0.0–0.2)

## 2022-05-25 LAB — RESP PANEL BY RT-PCR (FLU A&B, COVID) ARPGX2
Influenza A by PCR: NEGATIVE
Influenza B by PCR: NEGATIVE
SARS Coronavirus 2 by RT PCR: NEGATIVE

## 2022-05-25 LAB — APTT: aPTT: 26 seconds (ref 24–36)

## 2022-05-25 MED ORDER — SODIUM CHLORIDE 0.9 % IV BOLUS
500.0000 mL | Freq: Once | INTRAVENOUS | Status: AC
  Administered 2022-05-25: 500 mL via INTRAVENOUS

## 2022-05-25 MED ORDER — STROKE: EARLY STAGES OF RECOVERY BOOK
Freq: Once | Status: AC
  Filled 2022-05-25: qty 1

## 2022-05-25 MED ORDER — MELATONIN 5 MG PO TABS
10.0000 mg | ORAL_TABLET | Freq: Once | ORAL | Status: AC
  Administered 2022-05-25: 10 mg via ORAL
  Filled 2022-05-25: qty 2

## 2022-05-25 NOTE — ED Notes (Signed)
D/t R arm restriction (hx breast cancer) and poor vasculature on L arm, unable to obtain additional IV prior to CT

## 2022-05-25 NOTE — Assessment & Plan Note (Signed)
   Evidence of acute/subacute infarct of the lateral right thalamus on noncontrast MRI of the brain   Monitoring patient on telemetry  Per neurology recommendations providing patient with aspirin 81 mg daily and clopidogrel 75 mg daily  Daily statin therapy will be adjusted if LDL is greater than 70  Further imaging to include: Carotid ultrasounds per neurology recommendation.  carotid stenosis may need to be discussed with vascular surgery based on results  Obtaining hemoglobin A1c and lipid panel in the morning  Echocardiogram in the morning with bubble study  PT, OT, SLP evaluation  Permissive hypertension with as needed antihypertensives only to be given if blood pressure greater than 220/115  Neurology following in consultation (seen by Dr. Leonel Ramsay), their assistance is appreciated.

## 2022-05-25 NOTE — ED Triage Notes (Signed)
Pt arrived by EMS, pt getting more weak x 1 week, more difficulty ambulating, strong smell to urine. Baseline dementia

## 2022-05-25 NOTE — Consult Note (Addendum)
Neurology Consultation  Reason for Consult: left side weakness Referring Physician: Dr. Truett Mainland   CC: difficulty with ambulation and strong smelling urine  History is obtained from:daughters at the bedside and medical record   HPI: RAEGYN RENDA is a 88 y.o. female with Dementia, HTN, HLD who presented to the ED via EMS for c/o difficulty walking weakness for one week and strong smelling urine. At 1554, In the ED Code stroke called by MD for Left side weakness. Lkw unclear but somewhere around 1000 am. Daughters are at the bedside and state that she had a banana at 1200, and suddenly started to slump to the left. On further discussions with the daughters she states that this morning she felt as if the patient was having trouble seeing on the left. CT head with no acute finding. MRI brain was obtained for treatment decision On exam she does have a left hemianopia and left arm weakness. Daughters state someone stays with her all the time, she is oriented to her self, can dress, feed and ambulate by herself  at baseline.    LKW: unclear, presumed sometime between 10 and 11 IV thrombolysis given?: no, stroke on MRI  Premorbid modified Rankin scale (mRS):  3-Moderate disability-requires help but walks WITHOUT assistance  ROS: Full ROS was performed and is negative except as noted in the HPI.    Past Medical History:  Diagnosis Date   Cataract    Gout    Hyperlipidemia    Hypertension    Memory loss    NSTEMI (non-ST elevated myocardial infarction) ([place]) 08/2020   Right-sided carotid artery disease ([place]) 09/17/2020   carotid doppler wth >60% occlusion     Family History  Problem Relation Age of Onset   Colon cancer Sister 49   Colon polyps Brother      Social History:   reports that she has quit smoking. She has never used smokeless tobacco. She reports that she does not drink alcohol and does not use drugs.  Medications No current facility-administered medications for this  encounter.  Current Outpatient Medications:    amLODipine (NORVASC) 5 MG tablet, Take 1 tablet (5 mg total) by mouth daily., Disp: , Rfl:    aspirin EC 81 MG EC tablet, Take 1 tablet (81 mg total) by mouth daily. Swallow whole., Disp: 30 tablet, Rfl: 11   Cholecalciferol (VITAMIN D3 PO), Take 1 capsule by mouth daily. Vit D3 (4,000U), Vit A, K2+ 4 immune boosters- Take 1 capsule by mouth daily, Disp: , Rfl:    ezetimibe (ZETIA) 10 MG tablet, Take 1 tablet (10 mg total) by mouth daily. Please make yearly appt with [name] for May 2023 for future refills. Thank you 1st attempt, Disp: 90 tablet, Rfl: 0   Ginkgo Biloba 120 MG CAPS, Take 120 mg by mouth in the morning., Disp: , Rfl:    losartan (COZAAR) 50 MG tablet, Take 1 tablet (50 mg total) by mouth daily., Disp: , Rfl:    Melatonin 10 MG TABS, Take 10 mg by mouth at bedtime. NEEDS NIGHTLY TO SLEEP OR WILL BE RESTLESS, Disp: , Rfl:    nitroGLYCERIN (NITROSTAT) 0.4 MG SL tablet, Place 1 tablet (0.4 mg total) under the tongue every 5 (five) minutes as needed for chest pain., Disp: 25 tablet, Rfl: 2   NON FORMULARY, Take 2 tablets by mouth See admin instructions. [employer] Mushroom tablets- Take 2 tablets by mouth daily with lunch, Disp: , Rfl:    NON FORMULARY, Take 2  tablets by mouth See admin instructions. Real Mushrooms Reishi Mushroom Extract tablets- Take 2 tablets by mouth daily with lunch, Disp: , Rfl:    NON FORMULARY, Take 2 capsules by mouth See admin instructions. Living[employer] Daily Healthy Cholesterol Support capsules- Take 2 capsules by mouth at bedtime, Disp: , Rfl:    Omega-3-acid Eth &Multivit/Min (OMEGA-3 RX COMPLETE PO), Take 1 capsule by mouth at bedtime., Disp: , Rfl:    pravastatin (PRAVACHOL) 40 MG tablet, Take 1 tablet (40 mg total) by mouth daily at 6 PM., Disp: 30 tablet, Rfl: 1   Exam: Current vital signs: BP 122/80   Pulse 66   Temp 98.8 F (37.1 C)   Resp 13   SpO2 100%  Vital signs in last 24 hours: Temp:  [98.8  F (37.1 C)] 98.8 F (37.1 C) (07/29 1436) Pulse Rate:  [66-81] 66 (07/29 1600) Resp:  [13-16] 13 (07/29 1600) BP: (122-147)/(80-81) 122/80 (07/29 1600) SpO2:  [98 %-100 %] 100 % (07/29 1600)  GENERAL: Awake, alert in NAD HEENT: - Normocephalic and atraumatic, dry mm LUNGS - Clear to auscultation bilaterally with no wheezes CV - S1S2 RRR, no m/r/g, equal pulses bilaterally. ABDOMEN - Soft, nontender, nondistended with normoactive BS Ext: warm, well perfused, intact peripheral pulses, no edema  NEURO:  Mental Status: AA&Ox1 (baseline) Language: speech is clear. Was able to name "glasses", other objects she was not able to identify and that seems to be her baseline.  Cranial Nerves: PERRL, EOMI, visual fields left hemianopia, no facial asymmetry, facial sensation intact, hearing intact, tongue/uvula/soft palate midline, normal sternocleidomastoid and trapezius muscle strength. No evidence of tongue atrophy or fibrillations Motor: left arm drifts but does not hit the bed, 5/5, left leg 4/5, right arm 5/5, right leg 4/5. Grip strengths are equal  Tone: is normal and bulk is normal Sensation- Intact to light touch bilaterally Coordination: FTN ataxia on the left  Gait- deferred  NIHSS 1a Level of Conscious.: 0 1b LOC Questions: 2 1c LOC Commands: 0 2 Best Gaze: 0 3 Visual: 2 4 Facial Palsy: 0 5a Motor Arm - left: 1 5b Motor Arm - Right: 0 6a Motor Leg - Left: 0 6b Motor Leg - Right: 0 7 Limb Ataxia: 1 8 Sensory: 0 9 Best Language: 2 10 Dysarthria: 0 11 Extinct. and Inatten.: 0 TOTAL: 8   Labs I have reviewed labs in epic and the results pertinent to this consultation are:  CBC    Component Value Date/Time   WBC 8.7 10/24/2021 0402   RBC 4.47 10/24/2021 0402   HGB 13.1 10/24/2021 0402   HCT 40.2 10/24/2021 0402   PLT 178 10/24/2021 0402   MCV 89.9 10/24/2021 0402   MCH 29.3 10/24/2021 0402   MCHC 32.6 10/24/2021 0402   RDW 13.6 10/24/2021 0402   LYMPHSABS 1.7  10/23/2021 1735   MONOABS 0.7 10/23/2021 1735   EOSABS 0.2 10/23/2021 1735   BASOSABS 0.1 10/23/2021 1735    CMP     Component Value Date/Time   NA 141 10/24/2021 0402   K 3.8 10/24/2021 0402   CL 109 10/24/2021 0402   CO2 20 (L) 10/24/2021 0402   GLUCOSE 110 (H) 10/24/2021 0402   BUN 18 10/24/2021 0402   CREATININE 0.91 10/24/2021 0402   CALCIUM 9.2 10/24/2021 0402   PROT 6.9 10/24/2021 0402   ALBUMIN 3.6 10/24/2021 0402   AST 17 10/24/2021 0402   ALT 11 10/24/2021 0402   ALKPHOS 73 10/24/2021 0402   BILITOT 1.0  10/24/2021 0402   GFRNONAA >60 10/24/2021 0402   GFRAA 56 (L) 05/10/2020 0150    Lipid Panel     Component Value Date/Time   CHOL 224 (H) 10/24/2021 0402   TRIG 260 (H) 10/24/2021 0402   HDL 37 (L) 10/24/2021 0402   CHOLHDL 6.1 10/24/2021 0402   VLDL 52 (H) 10/24/2021 0402   LDLCALC 135 (H) 10/24/2021 0402     Imaging I have reviewed the images obtained:  CT-head 1. No acute intracranial abnormality or significant interval change. 2. Stable atrophy and white matter disease. 3. Remote inferior right cerebellar infarct. 4. Remote subcortical infarct in the anterior right frontal lobe is stable. 5. Aspects 10/10   Beulah Gandy DNP, ACNPC-AG   MRI examination of the brain-large left thalamic infarct, with QT change present within the infarct.   I have seen the patient and was present for the entirety of the evaluation and management reflected in the above note.  I do feel that she has maybe slight left weakness 4+/5.  She has a clear left hemianopia, right gaze preference, and appears to be neglecting the left side slightly.  She is unaware of her visual deficits, and so I am not certain exactly when those started.  She was her normal self earlier this morning, likely definitely normal sometime between 10 and 11, but then at 1230 it was noted that she was slumping to the left.  Given the unclear time of onset, by the time my evaluation she was outside  of the tenecteplase window.  I did consider treatment based on the wake-up trial given that there was some unclear this to the time, and she was taken for an emergent MRI which demonstrated T2 changes already present within the infarct.  She will therefore need to be admitted for therapy evaluations and secondary risk factor modification.   Assessment:  BABARA BUFFALO is a 88 y.o. female with Dementia, HTN, HLD At 38, In the ED Code stroke called by MD for  Recommendations: - HgbA1c, fasting lipid panel - Frequent neuro checks - Echocardiogram - US carotids  - Prophylactic therapy-Antiplatelet med: continue home Aspirin - dose '81mg'$  and plavix '75mg'$  daily  - Risk factor modification - Bedside swallow eval- if negative will start a diet  - Telemetry monitoring - PT consult, OT consult, Speech consult - Stroke team to follow   Roland Rack, MD [facility] [phone]  If 7pm- 7am, please page neurology on call as listed in [facility].

## 2022-05-25 NOTE — ED Notes (Signed)
When MRI tech was taking patient out of machine, pt raised L leg and suffered small abrasion to L knee. No bleeding noted.

## 2022-05-25 NOTE — Progress Notes (Addendum)
Able to obtain limited study for pt's MRI before pt began attempting to crawl out of scanner raising arms and legs up. Only sequences not obtained include AX T1 and COR T2. Scan aborted for pt safety. Exam complete as limited study.

## 2022-05-25 NOTE — ED Provider Notes (Signed)
[facility] EMERGENCY DEPARTMENT Provider Note   CSN: [id number] Arrival date & time: 05/25/22  1426     History  Chief Complaint  Patient presents with   Weakness    Katelyn Powers is a 88 y.o. female with history of hypertension, hyperlipidemia, prior stroke, NSTEMI, dementia presenting to the emergency department with weakness.  Triage note reports history of weakness for 1 week, with difficulty ambulating, and foul-smelling urine.  When I discussed with the patient's family, they report that they were concerned for stroke as around lunchtime they noted that she was slumping to her left side and seemed to have visual changes in her left eye.  They do report that she has had some foul-smelling urine recently and occasionally presses on her suprapubic region.  The patient currently denies any abdominal pain, headaches, numbness or tingling.  They called EMS shortly after this episode who brought the patient to the emergency department.  They report that this is definitely a change from yesterday and also this morning.  History from patient limited due to dementia   Weakness      Home Medications Prior to Admission medications   Medication Sig Start Date End Date Taking? Authorizing Provider  amLODipine (NORVASC) 5 MG tablet Take 1 tablet (5 mg total) by mouth daily. 09/07/20   Darreld Mclean, PA-C  aspirin EC 81 MG EC tablet Take 1 tablet (81 mg total) by mouth daily. Swallow whole. 10/26/21   de Yolanda Manges, Cortney E, NP  Cholecalciferol (VITAMIN D3 PO) Take 1 capsule by mouth daily. Vit D3 (4,000U), Vit A, K2+ 4 immune boosters- Take 1 capsule by mouth daily    [provider]  ezetimibe (ZETIA) 10 MG tablet Take 1 tablet (10 mg total) by mouth daily. Please make yearly appt with [name] for May 2023 for future refills. Thank you 1st attempt 01/16/22   Belva Crome, MD  Ginkgo Biloba 120 MG CAPS Take 120 mg by mouth in the morning.    [provider]  losartan (COZAAR) 50 MG tablet Take 1 tablet (50 mg total) by mouth daily. 09/07/20   Darreld Mclean, PA-C  Melatonin 10 MG TABS Take 10 mg by mouth at bedtime. NEEDS NIGHTLY TO SLEEP OR WILL BE RESTLESS    [provider]  nitroGLYCERIN (NITROSTAT) 0.4 MG SL tablet Place 1 tablet (0.4 mg total) under the tongue every 5 (five) minutes as needed for chest pain. 09/07/20   Sande Rives E, PA-C  NON FORMULARY Take 2 tablets by mouth See admin instructions. [employer] Mushroom tablets- Take 2 tablets by mouth daily with lunch    [provider]  NON FORMULARY Take 2 tablets by mouth See admin instructions. Real Mushrooms Reishi Mushroom Extract tablets- Take 2 tablets by mouth daily with lunch    [provider]  NON FORMULARY Take 2 capsules by mouth See admin instructions. [employer] Daily Healthy Cholesterol Support capsules- Take 2 capsules by mouth at bedtime    [provider]  Omega-3-acid Eth &Multivit/Min (OMEGA-3 RX COMPLETE PO) Take 1 capsule by mouth at bedtime.    [provider]  pravastatin (PRAVACHOL) 40 MG tablet Take 1 tablet (40 mg total) by mouth daily at 6 PM. 10/25/21   de Yolanda Manges, Altamease Oiler, NP      Allergies    Patient has no known allergies.    Review of Systems   Review of Systems  Neurological:  Positive for  weakness.  See HPI  Physical Exam Updated Vital Signs BP 125/89   Pulse 72   Temp 98.4 F (36.9 C)   Resp 12   SpO2 98%  Physical Exam Constitutional:      Appearance: Normal appearance.  HENT:     Head: Normocephalic and atraumatic.     Right Ear: External ear normal.     Left Ear: External ear normal.     Nose: No congestion.     Mouth/Throat:     Mouth: Mucous membranes are moist.  Eyes:     Pupils: Pupils are equal, round, and reactive to light.     Comments: Slight gaze deviation to the right.  Left visual field defect  Cardiovascular:     Rate and Rhythm: Normal rate and  regular rhythm.  Pulmonary:     Effort: Pulmonary effort is normal. No respiratory distress.  Abdominal:     General: Abdomen is flat. There is no distension.     Palpations: Abdomen is soft.  Musculoskeletal:     Cervical back: Neck supple.     Right lower leg: No edema.     Left lower leg: No edema.  Skin:    General: Skin is warm and dry.  Neurological:     Mental Status: She is alert.     Comments: Patient oriented to self only. Cranial nerves II through XII intact with exception of visual field deficit and gaze deviation, strength 5 out of 5 in the right upper and bilateral lower extremities, 4 out of 5 in the left upper extremity with grip strength, extension and flexion. no sensory deficit to light touch, gait deferred   Psychiatric:        Mood and Affect: Mood normal.        Behavior: Behavior normal.     ED Results / Procedures / Treatments   Labs (all labs ordered are listed, but only abnormal results are displayed) Labs Reviewed  DIFFERENTIAL - Abnormal; Notable for the following components:      Result Value   Abs Immature Granulocytes 0.10 (*)    All other components within normal limits  COMPREHENSIVE METABOLIC PANEL - Abnormal; Notable for the following components:   Glucose, Bld 111 (*)    Total Bilirubin 1.4 (*)    GFR, Estimated 56 (*)    All other components within normal limits  I-STAT CHEM 8, ED - Abnormal; Notable for the following components:   BUN 29 (*)    Calcium, Ion 0.94 (*)    All other components within normal limits  CBG MONITORING, ED - Abnormal; Notable for the following components:   Glucose-Capillary 100 (*)    All other components within normal limits  TROPONIN I (HIGH SENSITIVITY) - Abnormal; Notable for the following components:   Troponin I (High Sensitivity) 40 (*)    All other components within normal limits  RESP PANEL BY RT-PCR (FLU A&B, COVID) ARPGX2  PROTIME-INR  APTT  CBC  ETHANOL  RAPID URINE DRUG SCREEN, HOSP PERFORMED   URINALYSIS, ROUTINE W REFLEX MICROSCOPIC  LIPID PANEL  HEMOGLOBIN A1C  TROPONIN I (HIGH SENSITIVITY)    EKG EKG Interpretation  Date/Time:  Saturday May 25 2022 16:27:18 EDT Ventricular Rate:  85 PR Interval:  204 QRS Duration: 97 QT Interval:  422 QTC Calculation: 502 R Axis:   111 Text Interpretation: Sinus rhythm Probable lateral infarct, age indeterminate Anteroseptal infarct, age indeterminate Prolonged QT interval Confirmed by Garnette Gunner [phone]) on 05/25/2022 6:04:35 PM  Radiology DG Chest 2 View  Result Date: 05/25/2022 CLINICAL DATA:  Weakness. EXAM: CHEST - 2 VIEW COMPARISON:  Chest x-ray 09/04/2020 FINDINGS: Cardiomediastinal silhouette is within normal limits. Lung volumes are low likely accentuating central pulmonary vascularity. There is no focal lung infiltrate, pleural effusion or pneumothorax. Surgical clips overlie the right chest, unchanged. No acute fractures are seen. IMPRESSION: No active cardiopulmonary disease. Electronically Signed   By: Ronney Asters M.D.   On: 05/25/2022 19:14   MR ANGIO HEAD WO CONTRAST  Result Date: 05/25/2022 CLINICAL DATA:  Increased difficulty with ambulating. Right thalamic infarct. EXAM: MRA HEAD WITHOUT CONTRAST TECHNIQUE: Angiographic images of the Circle of Willis were acquired using MRA technique without intravenous contrast. COMPARISON:  MR head without contrast 05/25/2022 FINDINGS: Anterior circulation: The internal carotid arteries are within normal limits the high cervical segments through the ICA termini. Mild narrowing of the proximal right M1 and A1 segments stable. Signal loss in the proximal M2 segments bilaterally is likely artifactual. Attenuation of MCA branch vessels is likely artifactual due to patient motion. Posterior circulation: The right vertebral artery is noted to be hypoplastic and terminates at PICA. Small V4 segment noted. Left PICA origin visualized and within normal limits. Basilar artery is normal. Both  posterior cerebral arteries originate basilar tip. Moderate left P1 segment stenosis present. PCA branch vessels are not well visualized due to artifact. Anatomic variants: None Other: None. IMPRESSION: 1. Study is moderately degraded by patient motion as described above. 2. Mild narrowing of the right M1 and A1 segments. 3. Signal loss in the proximal M2 segments and PCA branch vessels bilaterally is likely artifactual due to patient motion. 4. Moderate left P1 segment stenosis. Electronically Signed   By: San Morelle M.D.   On: 05/25/2022 18:33   MR BRAIN WO CONTRAST  Result Date: 05/25/2022 CLINICAL DATA:  Increased difficulty ambulating. Baseline dementia. EXAM: MRI HEAD WITHOUT CONTRAST TECHNIQUE: Multiplanar, multiecho pulse sequences of the brain and surrounding structures were obtained without intravenous contrast. COMPARISON:  CT head without contrast 04/25/2022 FINDINGS: Brain: Acute/subacute nonhemorrhagic infarct is present in the lateral right thalamus measuring 21 x 6 mm on axial images. T2 and FLAIR signal hyperintensity is associated with the area of acute infarction. Remote encephalomalacia of the inferior right cerebellum is again noted. Advanced atrophy and diffuse white matter disease is stable. Remote lacunar infarct is present adjacent to the frontal horn the right lateral ventricle. The ventricles are proportionate to the degree of atrophy. No significant extraaxial fluid collection is present. Vascular: Close present in the major intracranial arteries. T2 weighted images are degraded by patient motion. Skull and upper cervical spine: The craniocervical junction is normal. Upper cervical spine is within normal limits. Marrow signal is unremarkable. Sinuses/Orbits: The paranasal sinuses and mastoid air cells are clear. The globes and orbits are within normal limits. IMPRESSION: 1. Acute/subacute nonhemorrhagic infarct involving the lateral right thalamus. T2 signal changes are  present within the infarct. 2. Advanced atrophy and diffuse white matter disease likely reflects the sequela of chronic microvascular ischemia. 3. Remote encephalomalacia of the inferior right cerebellum. Electronically Signed   By: San Morelle M.D.   On: 05/25/2022 18:31   CT HEAD CODE STROKE WO CONTRAST  Result Date: 05/25/2022 CLINICAL DATA:  Code stroke. Left-sided weakness. Last known well at 12:30 p.m. EXAM: CT HEAD WITHOUT CONTRAST TECHNIQUE: Contiguous axial images were obtained from the base of the skull through the vertex without intravenous contrast. RADIATION DOSE REDUCTION: This exam was performed according  to the departmental dose-optimization program which includes automated exposure control, adjustment of the mA and/or kV according to patient size and/or use of iterative reconstruction technique. COMPARISON:  MR head without contrast 10/24/2021. FINDINGS: Brain: Remote inferior right cerebellar infarct again noted. Moderate atrophy and white matter disease is stable. No acute infarct, hemorrhage, or mass lesion is present. The ventricles are proportionate to the degree of atrophy. Remote subcortical infarct in the anterior right frontal lobe is stable. No significant extraaxial fluid collection is present. Brainstem and cerebellum are otherwise within normal limits. Vascular: Minimal atherosclerotic calcifications are again seen within the cavernous internal carotid artery in the dural margin of the left vertebral artery. No hyperdense vessel is present. Skull: Calvarium is intact. No focal lytic or blastic lesions are present. No significant extracranial soft tissue lesion is present. Sinuses/Orbits: The paranasal sinuses and mastoid air cells are clear. The globes and orbits are within normal limits. Other: ASPECTS ([place] Stroke Program Early CT Score) - Ganglionic level infarction (caudate, lentiform nuclei, internal capsule, insula, M1-M3 cortex): 7/7 - Supraganglionic infarction  (M4-M6 cortex): 3/3 Total score (0-10 with 10 being normal): 10/10 IMPRESSION: 1. No acute intracranial abnormality or significant interval change. 2. Stable atrophy and white matter disease. 3. Remote inferior right cerebellar infarct. 4. Remote subcortical infarct in the anterior right frontal lobe is stable. 5. Aspects 10/10 * The above was relayed via text pager to Dr. Leonel Ramsay on 05/25/2022 at 16:20 . Electronically Signed   By: San Morelle M.D.   On: 05/25/2022 16:20    Procedures Procedures    Medications Ordered in ED Medications   stroke: early stages of recovery book (has no administration in time range)  melatonin tablet 10 mg (10 mg Oral Given 05/25/22 2053)  sodium chloride 0.9 % bolus 500 mL (500 mLs Intravenous New Bag/Given 05/25/22 2053)    ED Course/ Medical Decision Making/ A&P Clinical Course as of 05/25/22 2208  Sat May 25, 2022  1725 Per neurology, MRI is concerning for acute stroke. Patient not tPA candidate.  [WS]  2207 Signed out to hospitalist, who will admit, pending urinalysis at this time. Family report some improvement in left sided weakness.  [WS]    Clinical Course User Index [WS] Cristie Hem, MD                           Medical Decision Making Amount and/or Complexity of Data Reviewed Labs: ordered. Radiology: ordered.  Risk OTC drugs. Decision regarding hospitalization.   88 year old female presenting to the emergency department with weakness.   History per family and per exam concerning for acute stroke.  I activated code stroke immediately after first evaluating the patient, after examining the patient and discussing with family.  Patient arrived at change of shift, and triage evaluation did not reflect concern for acute stroke for which family had called EMS. neurology immediately came and evaluated the patient after code stroke, and felt the last known well time per family was possible closer to 10:30 AM.  Initial CT scan  negative for acute stroke.  Neurology will take patient to MRI for further evaluation.  The daughter reports that she had attempted to communicate her concerns to nursing when patient arrived but for some reason this history was not included in the triage note or communicated to providers.  Also check labs including urinalysis given report of foul-smelling urine.  Symptoms could less likely represent recrudescence in the setting of infectious  process.           Final Clinical Impression(s) / ED Diagnoses Final diagnoses:  Acute stroke due to ischemia [facility])  Visual field defect  Weakness    Rx / DC Orders ED Discharge Orders     None         Cristie Hem, MD 05/25/22 2208

## 2022-05-25 NOTE — H&P (Incomplete)
History and Physical    Patient: Katelyn Powers MRN: [id number] DOA: 05/25/2022  Date of Service: the patient was seen and examined on 05/26/2022  Patient coming from: Home via EMS  Chief Complaint:  Chief Complaint  Patient presents with   Weakness    HPI:   88 year old female with past medical history of Alzheimer's dementia, hypertension, hyperlipidemia, mild aortic stenosis, carotid stenosis (09/2021 right ICA 65-70% stenosis on CTA), coronary artery disease (NSTEMI 08/2020, medically managed), diastolic congestive heart failure (Echo 08/2021 EF 55-60% with G1DD), prior stroke and multiple TIAs (most recent 09/2021 S/P TNK administration) who presents to [facility] emergency department with sudden onset weakness.  Patient is a poor historian due to known history of Alzheimer's dementia.  Majority of history has been obtained from discussions with the emergency department staff, review of ED/neurology notes and discussions with family.  Daughter reports that at approximately 10 AM this morning she checked in her mother who was sitting on the couch and she "seemed to be leaning towards her left "however the daughter could not discern any other neurologic deficit.  Slightly later in the day, at approximately 12 PM patient was noted to have some left-sided weakness when the daughter tried to get her up off the couch.  She also noted that her mother was having difficulty seeing things towards her left.  Daughter denies any additional symptoms such as facial droop, slurring of words, changes in mentation or complaints of headache.  After short while both the daughter and another sibling were concerned about stroke activity and therefore attempted to contact EMS.  EMS arrived at approximately 1:30 PM and brought the patient into [facility] emergency department for evaluation.  Despite concerns for strokelike activity by family, the patient was not initially brought in as a code  stroke.  It was not until the daughter was able to make it to the patient's bedside later that afternoon the ED staff was informed and a code stroke was called by the EDP.  Neurology evaluated the patient and tPA was not administered at this point.  MRI brain without contrast did confirm an acute infarct involving the lateral right thalamus.  Hospitalist group was then called to assess the patient for admission to the hospital.  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 She has never used smokeless tobacco. She reports that she does not drink alcohol and does not use drugs.  No Known Allergies  Family History  Problem Relation Age of Onset   Colon cancer Sister 63   Colon polyps Brother     Prior to Admission medications   Medication Sig Start Date End Date Taking? Authorizing Provider  amLODipine (NORVASC) 5 MG tablet Take 1 tablet (5 mg total) by mouth daily. 09/07/20   Darreld Mclean, PA-C  aspirin EC 81 MG EC  tablet Take 1 tablet (81 mg total) by mouth daily. Swallow whole. 10/26/21   de Yolanda Manges, Cortney E, NP  Cholecalciferol (VITAMIN D3 PO) Take 1 capsule by mouth daily. Vit D3 (4,000U), Vit A, K2+ 4 immune boosters- Take 1 capsule by mouth daily    [provider]  ezetimibe (ZETIA) 10 MG tablet Take 1 tablet (10 mg total) by mouth daily. Please make yearly appt with [name] for May 2023 for future refills. Thank you  1st attempt 01/16/22   Belva Crome, MD  Ginkgo Biloba 120 MG CAPS Take 120 mg by mouth in the morning.    [provider]  losartan (COZAAR) 50 MG tablet Take 1 tablet (50 mg total) by mouth daily. 09/07/20   Darreld Mclean, PA-C  Melatonin 10 MG TABS Take 10 mg by mouth at bedtime. NEEDS NIGHTLY TO SLEEP OR WILL BE RESTLESS    [provider]  nitroGLYCERIN (NITROSTAT) 0.4 MG SL tablet Place 1 tablet (0.4 mg total) under the tongue every 5 (five) minutes as needed for chest pain. 09/07/20   Sande Rives E, PA-C  NON FORMULARY Take 2 tablets by mouth See admin instructions. [employer] Mushroom tablets- Take 2 tablets by mouth daily with lunch    [provider]  NON FORMULARY Take 2 tablets by mouth See admin instructions. Real Mushrooms Reishi Mushroom Extract tablets- Take 2 tablets by mouth daily with lunch    [provider]  NON FORMULARY Take 2 capsules by mouth See admin instructions. Living[employer] Daily Healthy Cholesterol Support capsules- Take 2 capsules by mouth at bedtime    [provider]  Omega-3-acid Eth &Multivit/Min (OMEGA-3 RX COMPLETE PO) Take 1 capsule by mouth at bedtime.    [provider]  pravastatin (PRAVACHOL) 40 MG tablet Take 1 tablet (40 mg total) by mouth daily at 6 PM. 10/25/21   de Yolanda Manges, Altamease Oiler, NP    Physical Exam:  Vitals:   05/25/22 1436 05/25/22 1600 05/25/22 1830 05/25/22 1900  BP: (!) 147/81 122/80 125/89   Pulse: 81 66 72   Resp: '16 13 12   '$ Temp: 98.8 F (37.1 C)   98.4 F (36.9 C)  SpO2: 98% 100% 98%      Constitutional: Awake alert and oriented x 1, no associated distress.   Skin: no rashes, no lesions,poor skin turgor noted. Eyes: Pupils are equally reactive to light.  No evidence of scleral icterus or conjunctival pallor.  ENMT: Moist mucous membranes noted.  Posterior pharynx clear of any exudate or lesions.   Neck: normal, supple, no masses, no thyromegaly.  No evidence of  jugular venous distension.   Respiratory: clear to auscultation bilaterally, no wheezing, no crackles. Normal respiratory effort. No accessory muscle use.  Cardiovascular: Regular rate and rhythm, notable 3 out of 6 systolic murmur, no extremity edema. 2+ pedal pulses. No carotid bruits.  Chest:   Nontender without crepitus or deformity.   Back:   Nontender without crepitus or deformity. Abdomen: Abdomen is soft and nontender.  No evidence of intra-abdominal masses.  Positive bowel sounds noted in all quadrants.   Musculoskeletal: No joint deformity upper and lower extremities. Good ROM, no contractures. Normal muscle tone.  Neurologic: Patient intermittently follows commands.  Patient is awake and alert but oriented x1 due to known history of dementia.  Discernible left upper and left lower extremity weakness, 4 out of 5 left sided proximal and distal muscle groups.   Left hemianopia observed.  Patient is responsive to verbal stimuli.   Psychiatric: Flat affect.  Unable to fully assess due to known history of advanced dementia.  Patient currently does not possess insight as to her current situation.    Data Reviewed:  I have personally reviewed and interpreted labs, imaging.  Significant findings are:  COVID-19 PCR testing negative. CBC revealing white blood cell count of 10.1, hemoglobin 14.2, hematocrit 14.2, platelet count 236 Chemistry revealing sodium of 139, potassium 4.0, chloride 106, bicarbonate 22, BUN 21, creatinine 0.98. Troponin 40 CXR:  Chest X-ray was personally reviewed.  No evidence of focal infiltrates.  No evidence of pleural effusion.  No evidence of pneumothorax.  MRI of the brain without contrast revealing an acute/subacute nonhemorrhagic infarct involving the lateral right thalamus.  EKG: Personally reviewed.  Rhythm is normal sinus rhythm with heart rate of 85 bpm.  QTc of 502.  No dynamic ST segment changes appreciated.   Assessment and Plan: * Acute stroke due to  ischemia [facility]) Evidence of acute/subacute infarct of the lateral right thalamus on noncontrast MRI of the brain  Monitoring patient on telemetry Per neurology recommendations providing patient with aspirin 81 mg daily and clopidogrel 75 mg daily Daily statin therapy will be adjusted if LDL is greater than 70 Further imaging to include: Carotid ultrasounds per neurology recommendation.  carotid stenosis may need to be discussed with vascular surgery based on results Obtaining hemoglobin A1c and lipid panel in the morning Echocardiogram in the morning with bubble study PT, OT, SLP evaluation Permissive hypertension with as needed antihypertensives only to be given if blood pressure greater than 220/115 Neurology following in consultation (seen by Dr. Leonel Ramsay), their assistance is appreciated.   Carotid stenosis, right Known history of right-sided carotid stenosis based on prior imaging Neurology recommending repeat assessment of carotid stenosis with carotid ultrasounds during this hospitalization.  Order has been placed Considering another acute cerebrovascular event, if carotid stenosis has progressed will likely need to consult vascular surgery in the morning  Elevated troponin level not due myocardial infarction Slightly elevated serial troponins with flat trajectory of elevation Patient is chest pain-free Likely secondary to underlying illness, plaque rupture is unlikely Monitoring patient on telemetry   Chronic diastolic CHF (congestive heart failure) (HCC) No clinical evidence of cardiogenic volume overload Strict input and output monitoring Daily weights Low-sodium diet   Coronary artery disease involving native coronary artery of native heart without angina pectoris Patient is currently chest pain free Monitoring patient on telemetry Continue home regimen of antiplatelet therapy, lipid lowering therapy  As needed nitroglycerin for episodes of chest pain   Mixed  hyperlipidemia Continuing home regimen of lipid lowering therapy. Lipid panel ordered for the morning Will titrate regimen if LDL greater than 70    Essential hypertension Permissive hypertension for now Holding home regimen of oral antihypertensives As needed intravenous intermittent is for markedly elevated blood pressure of greater than 220/115   Late onset Alzheimer's disease without behavioral disturbance (HCC) Longstanding known history of dementia and cognitive deficit Minimizing uncomfortable stimuli Minimizing mood altering and sedating agents Encouraging family to remain at bedside is much as possible Frequent redirection by staff Fall precautions        Code Status:  Full code  code status decision has been confirmed with: daughter Family Communication: Daughter is at the bedside who has been updated on plan of care  Consults: Dr. Leonel Ramsay with Neurology  Severity of Illness:  The appropriate patient status for this patient is INPATIENT. Inpatient status  is judged to be reasonable and necessary in order to provide the required intensity of service to ensure the patient's safety. The patient's presenting symptoms, physical exam findings, and initial radiographic and laboratory data in the context of their chronic comorbidities is felt to place them at high risk for further clinical deterioration. Furthermore, it is not anticipated that the patient will be medically stable for discharge from the hospital within 2 midnights of admission.   * I certify that at the point of admission it is my clinical judgment that the patient will require inpatient hospital care spanning beyond 2 midnights from the point of admission due to high intensity of service, high risk for further deterioration and high frequency of surveillance required.*  Author:  Vernelle Emerald MD  05/26/2022 12:30 AM

## 2022-05-26 ENCOUNTER — Inpatient Hospital Stay (HOSPITAL_COMMUNITY): Payer: Medicare Other

## 2022-05-26 ENCOUNTER — Encounter (HOSPITAL_COMMUNITY): Payer: Self-pay | Admitting: Internal Medicine

## 2022-05-26 DIAGNOSIS — R778 Other specified abnormalities of plasma proteins: Secondary | ICD-10-CM | POA: Diagnosis present

## 2022-05-26 LAB — LIPID PANEL
Cholesterol: 137 mg/dL (ref 0–200)
HDL: 46 mg/dL (ref >40)
LDL Cholesterol: 55 mg/dL (ref 0–99)
Total CHOL/HDL Ratio: 3 RATIO
Triglycerides: 181 mg/dL — ABNORMAL HIGH (ref <150)
VLDL: 36 mg/dL (ref 0–40)

## 2022-05-26 LAB — CBC WITH DIFFERENTIAL/PLATELET
Abs Immature Granulocytes: 0.03 10*3/uL (ref 0.00–0.07)
Basophils Absolute: 0 10*3/uL (ref 0.0–0.1)
Basophils Relative: 0 %
Eosinophils Absolute: 0.2 10*3/uL (ref 0.0–0.5)
Eosinophils Relative: 2 %
HCT: 39.3 % (ref 36.0–46.0)
Hemoglobin: 13.2 g/dL (ref 12.0–15.0)
Immature Granulocytes: 0 %
Lymphocytes Relative: 20 %
Lymphs Abs: 1.8 10*3/uL (ref 0.7–4.0)
MCH: 30.1 pg (ref 26.0–34.0)
MCHC: 33.6 g/dL (ref 30.0–36.0)
MCV: 89.5 fL (ref 80.0–100.0)
Monocytes Absolute: 0.8 10*3/uL (ref 0.1–1.0)
Monocytes Relative: 9 %
Neutro Abs: 6.1 10*3/uL (ref 1.7–7.7)
Neutrophils Relative %: 69 %
Platelets: 238 10*3/uL (ref 150–400)
RBC: 4.39 MIL/uL (ref 3.87–5.11)
RDW: 13.3 % (ref 11.5–15.5)
WBC: 8.8 10*3/uL (ref 4.0–10.5)
nRBC: 0 % (ref 0.0–0.2)

## 2022-05-26 LAB — COMPREHENSIVE METABOLIC PANEL
ALT: 14 U/L (ref 0–44)
AST: 20 U/L (ref 15–41)
Albumin: 3.5 g/dL (ref 3.5–5.0)
Alkaline Phosphatase: 78 U/L (ref 38–126)
Anion gap: 8 (ref 5–15)
BUN: 17 mg/dL (ref 8–23)
CO2: 25 mmol/L (ref 22–32)
Calcium: 9.7 mg/dL (ref 8.9–10.3)
Chloride: 106 mmol/L (ref 98–111)
Creatinine, Ser: 1.07 mg/dL — ABNORMAL HIGH (ref 0.44–1.00)
GFR, Estimated: 50 mL/min — ABNORMAL LOW (ref >60)
Glucose, Bld: 113 mg/dL — ABNORMAL HIGH (ref 70–99)
Potassium: 4.2 mmol/L (ref 3.5–5.1)
Sodium: 139 mmol/L (ref 135–145)
Total Bilirubin: 1.3 mg/dL — ABNORMAL HIGH (ref 0.3–1.2)
Total Protein: 7.1 g/dL (ref 6.5–8.1)

## 2022-05-26 LAB — URINALYSIS, ROUTINE W REFLEX MICROSCOPIC
Bilirubin Urine: NEGATIVE
Glucose, UA: NEGATIVE mg/dL
Hgb urine dipstick: NEGATIVE
Ketones, ur: NEGATIVE mg/dL
Leukocytes,Ua: NEGATIVE
Nitrite: NEGATIVE
Protein, ur: 100 mg/dL — AB
Specific Gravity, Urine: 1.01 (ref 1.005–1.030)
pH: 6 (ref 5.0–8.0)

## 2022-05-26 LAB — HEMOGLOBIN A1C
Hgb A1c MFr Bld: 6.3 % — ABNORMAL HIGH (ref 4.8–5.6)
Mean Plasma Glucose: 134.11 mg/dL

## 2022-05-26 LAB — RAPID URINE DRUG SCREEN, HOSP PERFORMED
Amphetamines: NOT DETECTED
Barbiturates: NOT DETECTED
Benzodiazepines: NOT DETECTED
Cocaine: NOT DETECTED
Opiates: NOT DETECTED
Tetrahydrocannabinol: NOT DETECTED

## 2022-05-26 LAB — ETHANOL: Alcohol, Ethyl (B): <10 mg/dL (ref <10)

## 2022-05-26 LAB — MAGNESIUM: Magnesium: 2.1 mg/dL (ref 1.7–2.4)

## 2022-05-26 MED ORDER — NITROGLYCERIN 0.4 MG SL SUBL
0.4000 mg | SUBLINGUAL_TABLET | SUBLINGUAL | Status: DC | PRN
Start: 2022-05-26 — End: 2022-05-28

## 2022-05-26 MED ORDER — HYDRALAZINE HCL 20 MG/ML IJ SOLN: 10.0000 mg | Freq: Four times a day (QID) | INTRAMUSCULAR | Status: DC | PRN

## 2022-05-26 MED ORDER — ENOXAPARIN SODIUM 40 MG/0.4ML IJ SOSY
40.0000 mg | PREFILLED_SYRINGE | INTRAMUSCULAR | Status: DC
  Administered 2022-05-27 – 2022-05-28 (×2): 40 mg via SUBCUTANEOUS
  Filled 2022-05-26 (×4): qty 0.4

## 2022-05-26 MED ORDER — ONDANSETRON HCL 4 MG PO TABS: 4.0000 mg | ORAL_TABLET | Freq: Four times a day (QID) | ORAL | Status: DC | PRN

## 2022-05-26 MED ORDER — CLOPIDOGREL BISULFATE 75 MG PO TABS
75.0000 mg | ORAL_TABLET | Freq: Every day | ORAL | Status: DC
  Administered 2022-05-26 – 2022-05-28 (×3): 75 mg via ORAL
  Filled 2022-05-26 (×3): qty 1

## 2022-05-26 MED ORDER — LOSARTAN POTASSIUM 50 MG PO TABS: 50.0000 mg | ORAL_TABLET | Freq: Every day | ORAL | Status: DC

## 2022-05-26 MED ORDER — IOHEXOL 350 MG/ML SOLN
80.0000 mL | Freq: Once | INTRAVENOUS | Status: AC | PRN
  Administered 2022-05-26: 80 mL via INTRAVENOUS

## 2022-05-26 MED ORDER — EZETIMIBE 10 MG PO TABS
10.0000 mg | ORAL_TABLET | Freq: Every day | ORAL | Status: DC
  Administered 2022-05-27: 10 mg via ORAL
  Filled 2022-05-26 (×2): qty 1

## 2022-05-26 MED ORDER — ASPIRIN 81 MG PO TBEC
81.0000 mg | DELAYED_RELEASE_TABLET | Freq: Every day | ORAL | Status: DC
  Administered 2022-05-26 – 2022-05-28 (×3): 81 mg via ORAL
  Filled 2022-05-26 (×3): qty 1

## 2022-05-26 MED ORDER — AMLODIPINE BESYLATE 5 MG PO TABS
5.0000 mg | ORAL_TABLET | Freq: Every day | ORAL | Status: DC
  Administered 2022-05-27 – 2022-05-28 (×2): 5 mg via ORAL
  Filled 2022-05-26 (×3): qty 1

## 2022-05-26 MED ORDER — GINKGO BILOBA 120 MG PO CAPS: 120.0000 mg | ORAL_CAPSULE | Freq: Every morning | ORAL | Status: DC

## 2022-05-26 MED ORDER — ONDANSETRON HCL 4 MG/2ML IJ SOLN: 4.0000 mg | Freq: Four times a day (QID) | INTRAMUSCULAR | Status: DC | PRN

## 2022-05-26 MED ORDER — ACETAMINOPHEN 650 MG RE SUPP: 650.0000 mg | Freq: Four times a day (QID) | RECTAL | Status: DC | PRN

## 2022-05-26 MED ORDER — POLYETHYLENE GLYCOL 3350 17 G PO PACK
17.0000 g | PACK | Freq: Every day | ORAL | Status: DC | PRN
Start: 2022-05-26 — End: 2022-05-28
  Administered 2022-05-27: 17 g via ORAL
  Filled 2022-05-26: qty 1

## 2022-05-26 MED ORDER — ACETAMINOPHEN 325 MG PO TABS: 650.0000 mg | ORAL_TABLET | Freq: Four times a day (QID) | ORAL | Status: DC | PRN

## 2022-05-26 MED ORDER — MELATONIN 5 MG PO TABS
10.0000 mg | ORAL_TABLET | Freq: Every day | ORAL | Status: DC
  Administered 2022-05-26 – 2022-05-27 (×2): 10 mg via ORAL
  Filled 2022-05-26 (×2): qty 2

## 2022-05-26 MED ORDER — PRAVASTATIN SODIUM 40 MG PO TABS
40.0000 mg | ORAL_TABLET | Freq: Every day | ORAL | Status: DC
  Administered 2022-05-26 – 2022-05-27 (×2): 40 mg via ORAL
  Filled 2022-05-26 (×3): qty 1

## 2022-05-26 NOTE — Progress Notes (Signed)
PROGRESS NOTE  Katelyn Powers  DOB: [birth year]  PCP: Cyndi Bender, PA-C AVW:[id number]  DOA: 05/25/2022  LOS: 1 day  Hospital Day: 2  Brief narrative: Katelyn Powers is a 88 y.o. female with PMH significant for Alzheimer's dementia, HTN, HLD, CAD, diastolic CHF, mild aortic stenosis, carotid artery stenosis, prior stroke with multiple TIAs Katelyn Powers presented to the ED on 7/29 with sudden onset of weakness. Per daughter, around 10 AM, Katelyn Powers was sitting on the couch and she "seemed to be leaning towards her left.  Slightly later in the day, at approximately 12 PM Katelyn Powers was noted to have some left-sided weakness when the daughter tried to get her up off the couch.  She also noted that her mother was having difficulty seeing things towards her left.  EMS was called and Katelyn Powers was brought to the ED at around 1:30 PM  In the ED, Katelyn Powers was afebrile, blood pressure was elevated 147/81, breathing on room air Troponin slightly elevated 40, otherwise labs unremarkable. CT scan of head did not show any acute intracranial abnormality but showed remote inferior right cerebellar infarct and a remote subcortical infarct in the anterior right frontal lobe MRI brain showed an acute/subacute nonhemorrhagic infarct involving the lateral right thalamus and chronic microvascular ischemia MRA head showed mild narrowing of the right M1 and A1 segments Seen by neurology Admitted to [facility] service  Subjective: Katelyn Powers was seen and examined this morning.  Pleasant elderly Caucasian female.  Propped up in bed.  Demented.  Cheerful.  Not in pain.  Daughter at bedside. Chart reviewed Remains hemodynamically stable   Assessment and plan: Acute ischemic stroke of the right lateral thalamus Brought to the ED for left-sided weakness.  On exam, also found to have a left hemianopia and left arm weakness.   Imaging as above Stroke work-up initiated Neurology consult appreciated Pending echo with bubble PTA on aspirin  81 mg daily, Plavix 75 mg daily, statin 40 mg daily  Currently continued on the same PT eval obtained.  Recommend CIR.  Carotid stenosis, right Known history of right-sided carotid stenosis based on prior imaging, 09/2021 right ICA 65-70% stenosis on CTA Pending CTA head and neck   Elevated troponin  CAD/HLD High-sensitivity troponin was elevated to 40 but Katelyn Powers with chest pain-free.  Elevated troponin likely due to underlying illness.  No need of further ischemia work-up at this time. Continue antiplatelet and statin Recent Labs    05/25/22 1805  TROPONINIHS 40*   Chronic diastolic CHF (congestive heart failure)  Essential hypertension Euvolemic at this time.  Last echo from 08/2021 with EF 55 to 60% and G1 DD  pending echo as part of stroke work-up PTA on amlodipine 5 mg daily, losartan 50 mg daily Permissive hypertension for now.   Resume gradually   Late onset Alzheimer's disease without behavioral disturbance  Longstanding known history of dementia and cognitive deficit Minimizing uncomfortable stimuli Minimizing mood altering and sedating agents Encouraging family to remain at bedside is much as possible Frequent redirection by staff Fall precautions  Goals of care   Code Status: Full Code    Mobility: PT eval obtained  Skin assessment:     Nutritional status:  There is no height or weight on file to calculate BMI.          Diet:  Diet Order             Diet Heart Room service appropriate? Yes; Fluid consistency: Thin  Diet effective now  DVT prophylaxis:  enoxaparin (LOVENOX) injection 40 mg Start: 05/26/22 1000   Antimicrobials: None Fluid: None Consultants: Neurology Family Communication: Daughter at bedside  Status is: Inpatient  Continue in-hospital care because: Pending completion of stroke work-up, pending CIR Level of care: Telemetry Medical   Dispo: The Katelyn Powers is from: Home              Anticipated d/c is  to: CIR              Katelyn Powers currently is not medically stable to d/c.   Difficult to place Katelyn Powers No     Infusions:    Scheduled Meds:   stroke: early stages of recovery book   Does not apply Once   aspirin EC  81 mg Oral Daily   enoxaparin (LOVENOX) injection  40 mg Subcutaneous Q24H   ezetimibe  10 mg Oral Daily   melatonin  10 mg Oral QHS   pravastatin  40 mg Oral q1800    PRN meds: acetaminophen **OR** acetaminophen, hydrALAZINE, nitroGLYCERIN, ondansetron **OR** ondansetron (ZOFRAN) IV, polyethylene glycol   Antimicrobials: Anti-infectives (From admission, onward)    None       Objective: Vitals:   05/26/22 0822 05/26/22 0823  BP: (!) 151/76   Pulse: 83   Resp: (!) 22   Temp:  98 F (36.7 C)  SpO2: 100%    No intake or output data in the 24 hours ending 05/26/22 0909 There were no vitals filed for this visit. Weight change:  There is no height or weight on file to calculate BMI.   Physical Exam: General exam: Pleasant, elderly Caucasian female.  Not in physical distress Skin: No rashes, lesions or ulcers. HEENT: Atraumatic, normocephalic, no obvious bleeding Lungs: Clear to auscultation bilaterally CVS: Regular rate and rhythm, no murmur GI/Abd soft, nontender, nondistended, bowel sound present CNS: Alert, awake, unable to answer orientation questions which is her baseline per daughter. Psychiatry: Cheerful, not restless or agitated Extremities: No pedal edema, no calf tenderness  Data Review: I have personally reviewed the laboratory data and studies available.  F/u labs ordered Unresulted Labs (From admission, onward)     Start     Ordered   05/26/22 0500  Lipid panel  (Labs)  Tomorrow morning,   R       Comments: Fasting    05/26/22 0004            Signed, Terrilee Croak, MD [facility] 05/26/2022

## 2022-05-26 NOTE — ED Notes (Signed)
IV team at bedside 

## 2022-05-26 NOTE — Progress Notes (Addendum)
STROKE TEAM PROGRESS NOTE   INTERVAL HISTORY Her daughter is at the bedside along with physical therapy. CTA complete today. Per patient's daughter at bedside, her physical examination is much improved compared to yesterday. Agreeable to ASA and Plavix Attempted Brilinta previously, concern for intolerance per patient's daughter with concern for increased confusion. Family expresses that they do not want to try Brilinta again Follows with Dr. Felecia Shelling outpatient   Vitals:   05/26/22 0300 05/26/22 0400 05/26/22 0412 05/26/22 0726  BP: 131/75 (!) 129/115 (!) 129/115   Pulse:   75   Resp: '12 13 13 20  '$ Temp:   98.3 F (36.8 C)   TempSrc:   Oral   SpO2:   99%    CBC:  Recent Labs  Lab 05/25/22 1805 05/25/22 2017  WBC 10.1  --   NEUTROABS 7.1  --   HGB 14.2 13.9  HCT 42.9 41.0  MCV 90.3  --   PLT 236  --    Basic Metabolic Panel:  Recent Labs  Lab 05/25/22 1805 05/25/22 2017  NA 139 136  K 4.0 4.7  CL 106 109  CO2 22  --   GLUCOSE 111* 96  BUN 21 29*  CREATININE 0.98 1.00  CALCIUM 9.8  --    Lipid Panel: No results for input(s): "CHOL", "TRIG", "HDL", "CHOLHDL", "VLDL", "LDLCALC" in the last 168 hours. HgbA1c: No results for input(s): "HGBA1C" in the last 168 hours. Urine Drug Screen:  Recent Labs  Lab 05/26/22 0048  LABOPIA NONE DETECTED  COCAINSCRNUR NONE DETECTED  LABBENZ NONE DETECTED  AMPHETMU NONE DETECTED  THCU NONE DETECTED  LABBARB NONE DETECTED    Alcohol Level No results for input(s): "ETH" in the last 168 hours.  IMAGING past 24 hours DG Chest 2 View  Result Date: 05/25/2022 CLINICAL DATA:  Weakness. EXAM: CHEST - 2 VIEW COMPARISON:  Chest x-ray 09/04/2020 FINDINGS: Cardiomediastinal silhouette is within normal limits. Lung volumes are low likely accentuating central pulmonary vascularity. There is no focal lung infiltrate, pleural effusion or pneumothorax. Surgical clips overlie the right chest, unchanged. No acute fractures are seen. IMPRESSION: No  active cardiopulmonary disease. Electronically Signed   By: Ronney Asters M.D.   On: 05/25/2022 19:14   MR ANGIO HEAD WO CONTRAST  Result Date: 05/25/2022 CLINICAL DATA:  Increased difficulty with ambulating. Right thalamic infarct. EXAM: MRA HEAD WITHOUT CONTRAST TECHNIQUE: Angiographic images of the Circle of Willis were acquired using MRA technique without intravenous contrast. COMPARISON:  MR head without contrast 05/25/2022 FINDINGS: Anterior circulation: The internal carotid arteries are within normal limits the high cervical segments through the ICA termini. Mild narrowing of the proximal right M1 and A1 segments stable. Signal loss in the proximal M2 segments bilaterally is likely artifactual. Attenuation of MCA branch vessels is likely artifactual due to patient motion. Posterior circulation: The right vertebral artery is noted to be hypoplastic and terminates at PICA. Small V4 segment noted. Left PICA origin visualized and within normal limits. Basilar artery is normal. Both posterior cerebral arteries originate basilar tip. Moderate left P1 segment stenosis present. PCA branch vessels are not well visualized due to artifact. Anatomic variants: None Other: None. IMPRESSION: 1. Study is moderately degraded by patient motion as described above. 2. Mild narrowing of the right M1 and A1 segments. 3. Signal loss in the proximal M2 segments and PCA branch vessels bilaterally is likely artifactual due to patient motion. 4. Moderate left P1 segment stenosis. Electronically Signed   By: Wynetta Fines.D.  On: 05/25/2022 18:33   MR BRAIN WO CONTRAST  Result Date: 05/25/2022 CLINICAL DATA:  Increased difficulty ambulating. Baseline dementia. EXAM: MRI HEAD WITHOUT CONTRAST TECHNIQUE: Multiplanar, multiecho pulse sequences of the brain and surrounding structures were obtained without intravenous contrast. COMPARISON:  CT head without contrast 04/25/2022 FINDINGS: Brain: Acute/subacute nonhemorrhagic  infarct is present in the lateral right thalamus measuring 21 x 6 mm on axial images. T2 and FLAIR signal hyperintensity is associated with the area of acute infarction. Remote encephalomalacia of the inferior right cerebellum is again noted. Advanced atrophy and diffuse white matter disease is stable. Remote lacunar infarct is present adjacent to the frontal horn the right lateral ventricle. The ventricles are proportionate to the degree of atrophy. No significant extraaxial fluid collection is present. Vascular: Close present in the major intracranial arteries. T2 weighted images are degraded by patient motion. Skull and upper cervical spine: The craniocervical junction is normal. Upper cervical spine is within normal limits. Marrow signal is unremarkable. Sinuses/Orbits: The paranasal sinuses and mastoid air cells are clear. The globes and orbits are within normal limits. IMPRESSION: 1. Acute/subacute nonhemorrhagic infarct involving the lateral right thalamus. T2 signal changes are present within the infarct. 2. Advanced atrophy and diffuse white matter disease likely reflects the sequela of chronic microvascular ischemia. 3. Remote encephalomalacia of the inferior right cerebellum. Electronically Signed   By: San Morelle M.D.   On: 05/25/2022 18:31   CT HEAD CODE STROKE WO CONTRAST  Result Date: 05/25/2022 CLINICAL DATA:  Code stroke. Left-sided weakness. Last known well at 12:30 p.m. EXAM: CT HEAD WITHOUT CONTRAST TECHNIQUE: Contiguous axial images were obtained from the base of the skull through the vertex without intravenous contrast. RADIATION DOSE REDUCTION: This exam was performed according to the departmental dose-optimization program which includes automated exposure control, adjustment of the mA and/or kV according to patient size and/or use of iterative reconstruction technique. COMPARISON:  MR head without contrast 10/24/2021. FINDINGS: Brain: Remote inferior right cerebellar infarct  again noted. Moderate atrophy and white matter disease is stable. No acute infarct, hemorrhage, or mass lesion is present. The ventricles are proportionate to the degree of atrophy. Remote subcortical infarct in the anterior right frontal lobe is stable. No significant extraaxial fluid collection is present. Brainstem and cerebellum are otherwise within normal limits. Vascular: Minimal atherosclerotic calcifications are again seen within the cavernous internal carotid artery in the dural margin of the left vertebral artery. No hyperdense vessel is present. Skull: Calvarium is intact. No focal lytic or blastic lesions are present. No significant extracranial soft tissue lesion is present. Sinuses/Orbits: The paranasal sinuses and mastoid air cells are clear. The globes and orbits are within normal limits. Other: ASPECTS ([place] Stroke Program Early CT Score) - Ganglionic level infarction (caudate, lentiform nuclei, internal capsule, insula, M1-M3 cortex): 7/7 - Supraganglionic infarction (M4-M6 cortex): 3/3 Total score (0-10 with 10 being normal): 10/10 IMPRESSION: 1. No acute intracranial abnormality or significant interval change. 2. Stable atrophy and white matter disease. 3. Remote inferior right cerebellar infarct. 4. Remote subcortical infarct in the anterior right frontal lobe is stable. 5. Aspects 10/10 * The above was relayed via text pager to Dr. Leonel Ramsay on 05/25/2022 at 16:20 . Electronically Signed   By: San Morelle M.D.   On: 05/25/2022 16:20    PHYSICAL EXAM GENERAL: Awake, alert, in NAD HEENT: - Normocephalic and atraumatic, dry mm LUNGS - Respirations regular and unlabored on room air CV - Extremities warm, well perfused, without edema   NEURO:  Mental Status: AA&O to self (baseline due to Alzheimer's dementia). Does not answer further orientation questions.  Follows simple commands intermittently with repeat instruction.  Language: speech is without dysarthria Was able to  name "glasses" after repeat instruction, other objects she was not able to identify and that seems to be her baseline.  Cranial Nerves: PERRL, EOMI, visual fields left hemianopia, no facial asymmetry, facial sensation intact, hearing intact, tongue/uvula/soft palate midline, normal sternocleidomastoid and trapezius muscle strength. No evidence of tongue atrophy or fasciculations Motor: Exam somewhat limited by patient's ability to follow commands. Left arm drifts but does not hit the bed, 5/5, left leg 4/5, right arm 5/5, right leg 4/5. Grip strengths are equal. Left side weakness at baseline per patient's daughter.  Tone: normal and bulk is normal Sensation: Intact and symmetric to light touch Coordination: FTN with slight ataxia on the left though she has weakness on the left at baseline Gait: deferred for patient safety   ASSESSMENT/PLAN Ms. Katelyn Powers is a 88 y.o. female with history of Alzheimer's dementia, HTN, HLD, CAD, right carotid stenosis, and history of TIA and MI who presented to the ED via EMS after they were called for difficulty ambulating. Upon further discussion with family it was discovered that she had been leaning towards the left and had vision changes with her left eye. At 1554, In the ED Code stroke called by MD for left side weakness.   Stroke: Right thalamic infarct Etiology:  likely secondary to large vessel stenosis Code Stroke CT head No acute abnormality.  CTA head & neck Positive for functionally occluded non-dominant and diminutive Right Vertebral Artery V4 segment. Dominant Left Vertebral Artery supplies the basilar without significant stenosis. Severe stenosis at the Left PCA origin. And attenuated bilateral PCA branches which appears more likely due to stenosis MRI  Acute/subacute nonhemorrhagic infarct involving the lateral right thalamus 2D Echo pending LDL 135 HgbA1c 5.9 VTE prophylaxis - Lovenox    Diet   Diet Heart Room service appropriate? Yes; Fluid  consistency: Thin   aspirin 81 mg daily and clopidogrel 75 mg daily prior to admission, now on aspirin 81 mg daily and clopidogrel 75 mg daily.  Therapy recommendations:  CIR Disposition:  pending  Hypertension Home meds:  losartan, norvasc Norvasc resumed 7/30 Stable Permissive hypertension (OK if < 220/120) but gradually normalize in 5-7 days Long-term BP goal normotensive  Hyperlipidemia Home meds:  Pravastatin and Zetia resumed in hospital LDL 135, goal < 70  Continue statin at discharge  Other Stroke Risk Factors Advanced Age >/= 15  Coronary artery disease ASA and plavix with PRN nitro for chest pain  Diastolic CHF  Other Active Problems Alzheimer's dementia Follows with Dr. Felecia Shelling with [facility] day # 1  Patient seen and examined by NP/APP with MD. MD to update note as needed.   Janine Ores, DNP, FNP-BC [facility] Pager: [phone]  ATTENDING ATTESTATION:  88 year old with right thalamic stroke on MRI.  CTA shows string sign occluded non-dominant and diminutive Right Vertebral Artery V4 segment. severe stenosis at the Left PCA origin. Right ICA origin and bulb approaching a RADIOGRAPHIC STRING SIGN.  Daughter had discussions with her primary care doctor and outside neurologist previously and refused intervention given her mom's level of dementia and concern for possible worsening of her stroke during the procedure.  At the moment she opts for medical management but can certainly discuss further tomorrow.  Echocardiograph still pending.  Discussed possibility of transitioning to aspirin and  Brilinta as she had a stroke on aspirin and Plavix however they had a bad experience of Brilinta in the past and did not want to start this. Continue physical therapy.   Dr. Reeves Forth evaluated pt independently, reviewed imaging, chart, labs. Discussed and formulated plan with the APP. Please see APP note above for details.   Total 36 minutes spent on counseling  patient and coordinating care, writing notes and reviewing chart.  Amay Mijangos,MD    To contact Stroke Continuity provider, please refer to [email or web address] After hours, contact General Neurology

## 2022-05-26 NOTE — Assessment & Plan Note (Signed)
   Known history of right-sided carotid stenosis based on prior imaging  Neurology recommending repeat assessment of carotid stenosis with carotid ultrasounds during this hospitalization.  Order has been placed  Considering another acute cerebrovascular event, if carotid stenosis has progressed will likely need to consult vascular surgery in the morning

## 2022-05-26 NOTE — ED Notes (Signed)
Pt clean and dry. [place]wick intact.

## 2022-05-26 NOTE — ED Notes (Signed)
Iv team at bedside  

## 2022-05-26 NOTE — Assessment & Plan Note (Signed)
No clinical evidence of cardiogenic volume overload Strict input and output monitoring Daily weights Low-sodium diet  

## 2022-05-26 NOTE — Assessment & Plan Note (Signed)
·   Slightly elevated serial troponins with flat trajectory of elevation °· Patient is chest pain-free °· Likely secondary to underlying illness, plaque rupture is unlikely °· Monitoring patient on telemetry ° ° ° ° ° ° °

## 2022-05-26 NOTE — ED Notes (Signed)
Blood attempt failed x2. IV team called

## 2022-05-26 NOTE — Assessment & Plan Note (Signed)
·   Permissive hypertension for now °· Holding home regimen of oral antihypertensives °· As needed intravenous intermittent is for markedly elevated blood pressure of greater than 220/115 ° °

## 2022-05-26 NOTE — Assessment & Plan Note (Signed)
   Longstanding known history of dementia and cognitive deficit  Minimizing uncomfortable stimuli  Minimizing mood altering and sedating agents  Encouraging family to remain at bedside is much as possible  Frequent redirection by staff  Fall precautions  

## 2022-05-26 NOTE — Evaluation (Signed)
Physical Therapy Evaluation Patient Details Name: Katelyn Powers MRN: [id number] DOB: 1932/09/19 Today's Date: 05/26/2022  History of Present Illness  88 y.o. female presents to [facility] hospital on 05/25/2022 with L weakness, imbalance. Pt found to have R thalamic infarct on MRI. PMH includes dementai, HTN, HLD, carotid stenosis, NSTEMI, CHF.  Clinical Impression  Pt presents to PT with deficits in functional mobility, balance, strength, power, attention. Pt with L weakness and tendency for left and posterior lean. Pt inattentive to left side but is able to utilize L side with verbal or tactile cues. Pt is at a high risk for falls due to weakness and imbalance and will benefit from aggressive therapies in an effort to reduce caregiver burden and to improve the ability to mobilize within the home. PT recommends AIR admission at this time, despite dementia the patient follows commands well and demonstrates the potential to improve balance and strength to significantly reduce falls risk. If family decline AIR placement the patient will benefit from HHPT along with a manual wheelchair and hospital bed.     Recommendations for follow up therapy are one component of a multi-disciplinary discharge planning process, led by the attending physician.  Recommendations may be updated based on patient status, additional functional criteria and insurance authorization.  Follow Up Recommendations Acute inpatient rehab (3hours/day)      Assistance Recommended at Discharge Frequent or constant Supervision/Assistance  Patient can return home with the following  Two people to help with walking and/or transfers;Two people to help with bathing/dressing/bathroom;Assistance with cooking/housework;Assistance with feeding;Direct supervision/assist for medications management;Direct supervision/assist for financial management;Help with stairs or ramp for entrance;Assist for transportation    Equipment Recommendations Wheelchair  (measurements PT);Wheelchair cushion (measurements PT);Hospital bed  Recommendations for Other Services  Rehab consult (family may prefer to discharge home but would like consult to better inform them on AIR program)    Functional Status Assessment Patient has had a recent decline in their functional status and demonstrates the ability to make significant improvements in function in a reasonable and predictable amount of time.     Precautions / Restrictions Precautions Precautions: Fall Restrictions Weight Bearing Restrictions: No      Mobility  Bed Mobility Overal bed mobility: Needs Assistance Bed Mobility: Supine to Sit, Sit to Supine     Supine to sit: Max assist, HOB elevated Sit to supine: Mod assist   General bed mobility comments: left lateral lean, assist to elevate trunk and to scoot hips anteriorly toward edge    Transfers Overall transfer level: Needs assistance Equipment used: 1 person hand held assist Transfers: Sit to/from Stand Sit to Stand: Mod assist, From elevated surface           General transfer comment: posterior lean against stretcher    Ambulation/Gait             Pre-gait activities: attempted side steps to left toward head of stretcher, maxA for one step with PT sliding pt's left foot and totalA to weight shift    Stairs            Wheelchair Mobility    Modified Rankin (Stroke Patients Only) Modified Rankin (Stroke Patients Only) Pre-Morbid Rankin Score: Moderate disability Modified Rankin: Severe disability     Balance Overall balance assessment: Needs assistance Sitting-balance support: Single extremity supported, Feet unsupported Sitting balance-Leahy Scale: Poor Sitting balance - Comments: modA improving to minA, left lateral lean Postural control: Left lateral lean, Posterior lean Standing balance support: Bilateral upper extremity supported Standing  balance-Leahy Scale: Poor Standing balance comment: mod-maxA,  posterior lean against stretcher                             Pertinent Vitals/Pain Pain Assessment Pain Assessment: Faces Faces Pain Scale: Hurts little more Pain Location: back Pain Descriptors / Indicators: Aching Pain Intervention(s): Monitored during session    Home Living Family/patient expects to be discharged to:: Private residence Living Arrangements: Children Available Help at Discharge: Family;Available 24 hours/day Type of Home: House Home Access: Stairs to enter Entrance Stairs-Rails: None Entrance Stairs-Number of Steps: 1   Home Layout: One level Home Equipment: Conservation officer, nature (2 wheels);Cane - single point;Shower seat Additional Comments: Plan to take pt to her home and continue to have daughters/family stay with her.    Prior Function Prior Level of Function : Needs assist             Mobility Comments: pt ambulates without use of an assistive device ADLs Comments: pt requires assistance with bathing and IADLs     Hand Dominance   Dominant Hand: Right    Extremity/Trunk Assessment   Upper Extremity Assessment Upper Extremity Assessment: LUE deficits/detail LUE Deficits / Details: grossly 4-/5, ROM WFL, inattentive to left side    Lower Extremity Assessment Lower Extremity Assessment: LLE deficits/detail LLE Deficits / Details: grossly 4-/5, ROM WFL, inattentive to left side    Cervical / Trunk Assessment Cervical / Trunk Assessment: Kyphotic  Communication   Communication: Expressive difficulties (dysarthria)  Cognition Arousal/Alertness: Awake/alert Behavior During Therapy: WFL for tasks assessed/performed Overall Cognitive Status: History of cognitive impairments - at baseline                                 General Comments: pt with advanced dementia at baseline, follows commands with increased time. L inattention.Impaired awareness of deficits and safety.        General Comments General comments (skin  integrity, edema, etc.): VSS on RA    Exercises     Assessment/Plan    PT Assessment Patient needs continued PT services  PT Problem List Decreased strength;Decreased activity tolerance;Decreased balance;Decreased mobility;Decreased cognition;Decreased knowledge of use of DME;Decreased safety awareness;Decreased knowledge of precautions       PT Treatment Interventions DME instruction;Gait training;Stair training;Functional mobility training;Therapeutic activities;Therapeutic exercise;Balance training;Neuromuscular re-education;Patient/family education;Wheelchair mobility training    PT Goals (Current goals can be found in the Care Plan section)  Acute Rehab PT Goals Patient Stated Goal: family goal to return home and improve mobility quality PT Goal Formulation: With family Time For Goal Achievement: 06/09/22 Potential to Achieve Goals: Fair    Frequency Min 4X/week     Co-evaluation               AM-PAC PT "6 Clicks" Mobility  Outcome Measure Help needed turning from your back to your side while in a flat bed without using bedrails?: A Lot Help needed moving from lying on your back to sitting on the side of a flat bed without using bedrails?: A Lot Help needed moving to and from a bed to a chair (including a wheelchair)?: A Lot Help needed standing up from a chair using your arms (e.g., wheelchair or bedside chair)?: A Lot Help needed to walk in hospital room?: Total Help needed climbing 3-5 steps with a railing? : Total 6 Click Score: 10    End of Session  Activity Tolerance: Patient tolerated treatment well Patient left: in bed;with call bell/phone within reach;with family/visitor present Nurse Communication: Mobility status PT Visit Diagnosis: Other abnormalities of gait and mobility (R26.89);Muscle weakness (generalized) (M62.81);Other symptoms and signs involving the nervous system (R29.898)    Time: 4827-0786 PT Time Calculation (min) (ACUTE ONLY): 45  min   Charges:   PT Evaluation $PT Eval Low Complexity: [place], PT, DPT Acute Rehabilitation Office [phone]   Zenaida Niece 05/26/2022, 12:58 PM

## 2022-05-26 NOTE — Evaluation (Signed)
Speech Language Pathology Evaluation Patient Details Name: Katelyn Powers MRN: [id number] DOB: Jun 17, 1932 Today's Date: 05/26/2022 Time: 9629-5284 SLP Time Calculation (min) (ACUTE ONLY): 25 min  Problem List:  Patient Active Problem List   Diagnosis Date Noted   Elevated troponin level not due myocardial infarction 05/26/2022   Acute stroke due to ischemia [facility]) 05/25/2022   Chronic diastolic CHF (congestive heart failure) ([place]) 05/25/2022   Coronary artery disease involving native coronary artery of native heart without angina pectoris 05/25/2022   Acute ischemic stroke ([place]) 10/23/2021   SBO (small bowel obstruction) ([place]) 09/25/2021   Late onset Alzheimer's disease without behavioral disturbance ([place]) 02/22/2021   Stenosis of intracranial vessel 02/22/2021   CVA (cerebral vascular accident) ([place]) 11/07/2020   Hypotension 09/06/2020   NSTEMI (non-ST elevated myocardial infarction) ([place]) 09/04/2020   Vitamin D deficiency 10/27/2019   Excessive sleepiness 10/27/2019   Essential hypertension 07/27/2018   Disorder of arteries and arterioles ([place]) 07/27/2018   Mixed hyperlipidemia 07/27/2018   Memory loss 07/27/2018   Insomnia 07/27/2018   Abnormal brain MRI 07/27/2018   Carotid stenosis, right 07/27/2018   Past Medical History:  Past Medical History:  Diagnosis Date   Cataract    Gout    Hyperlipidemia    Hypertension    Memory loss    NSTEMI (non-ST elevated myocardial infarction) ([place]) 08/2020   Right-sided carotid artery disease ([place]) 09/17/2020   carotid doppler wth >60% occlusion   Past Surgical History:  Past Surgical History:  Procedure Laterality Date   MELANOMA EXCISION Right 10/28/1970   level 2   SUPERFICIAL LYMPH NODE BIOPSY / EXCISION  10/28/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 female presents to [facility] hospital on 05/25/2022 with L weakness, imbalance. Pt found to have R thalamic infarct on MRI. PMH includes dementai, HTN, HLD,  carotid stenosis, NSTEMI, CHF.   Assessment / Plan / Recommendation Clinical Impression  [name] cognitive-communicative function appears to be at baseline excluding increased delays in response time and left neglect.  Speech is clear and fluent; she follows simple commands and engages in social communication. Memory, attention, and problem-solving are impaired but consistent with baseline function.  [name] two daughters were at bedside.  No SLP f/u is needed - they are hoping to focus more on her physical rehab in order to get her back home with support (Pt lives with each daughter one week at a time.) Our service will sign off.    SLP Assessment  SLP Recommendation/Assessment: Patient does not need any further Speech [name] SLP Visit Diagnosis: Cognitive communication deficit (R41.841)    Recommendations for follow up therapy are one component of a multi-disciplinary discharge planning process, led by the attending physician.  Recommendations may be updated based on patient status, additional functional criteria and insurance authorization.    Follow Up Recommendations  No SLP follow up    Assistance Recommended at Discharge   Frequent/constant                 SLP Evaluation Cognition  Overall Cognitive Status: History of cognitive impairments - at baseline Arousal/Alertness: Awake/alert Orientation Level: Oriented to person;Disoriented to place;Disoriented to time;Disoriented to situation Attention: Sustained Sustained Attention: Impaired Sustained Attention Impairment: Verbal basic;Functional basic Memory: Impaired Memory Impairment: Storage deficit Awareness: Impaired Awareness Impairment: Intellectual impairment Problem Solving: Impaired Safety/Judgment: Impaired       Comprehension  Auditory Comprehension Overall Auditory Comprehension: Other (comment) (follows basic commands and  engages in social conversation)    Expression Expression Primary Mode  of Expression: Verbal Verbal Expression Overall Verbal Expression: Appears within functional limits for tasks assessed Written Expression Dominant Hand: Right   Oral / Motor  Oral Motor/Sensory Function Overall Oral Motor/Sensory Function: Within functional limits Motor Speech Overall Motor Speech: Appears within functional limits for tasks assessed           Katelyn Powers L. Tivis Ringer, MA CCC/SLP Clinical Specialist - Acute Care SLP Acute Rehabilitation Services Office number [phone]  Katelyn Powers 05/26/2022, 2:57 PM

## 2022-05-26 NOTE — Assessment & Plan Note (Signed)
.   Patient is currently chest pain free . Monitoring patient on telemetry . Continue home regimen of antiplatelet therapy, lipid lowering therapy and AV nodal blocking therapy

## 2022-05-26 NOTE — Progress Notes (Signed)
Inpatient Rehab Admissions Coordinator Note:   Per PT patient was screened for CIR candidacy by Mikayla Chiusano Danford Bad, CCC-SLP. At this time, pt appears to be a potential candidate for CIR. I will place an order for rehab consult for full assessment, per our protocol.  Please contact me any with questions.Gayland Curry, [place], [place] Admissions Coordinator [phone] 05/26/22 1:52 PM

## 2022-05-26 NOTE — Assessment & Plan Note (Signed)
.   Continuing home regimen of lipid lowering therapy. . Lipid panel ordered for the morning . Will titrate regimen if LDL greater than 70

## 2022-05-27 ENCOUNTER — Inpatient Hospital Stay (HOSPITAL_COMMUNITY): Payer: Medicare Other

## 2022-05-27 DIAGNOSIS — R778 Other specified abnormalities of plasma proteins: Secondary | ICD-10-CM

## 2022-05-27 DIAGNOSIS — I6389 Other cerebral infarction: Secondary | ICD-10-CM | POA: Diagnosis not present

## 2022-05-27 LAB — ECHOCARDIOGRAM COMPLETE BUBBLE STUDY
AR max vel: 1.33 cm2
AV Area VTI: 1.17 cm2
AV Area mean vel: 1.12 cm2
AV Mean grad: 12 mmHg
AV Peak grad: 19.5 mmHg
Ao pk vel: 2.21 m/s
Area-P 1/2: 2.01 cm2
MV VTI: 1.49 cm2
P 1/2 time: 439 msec
S' Lateral: 3 cm

## 2022-05-27 MED ORDER — SODIUM CHLORIDE 0.9% FLUSH
10.0000 mL | Freq: Once | INTRAVENOUS | Status: AC
Start: 2022-05-27 — End: 2022-05-27
  Administered 2022-05-27: 10 mL via INTRAVENOUS

## 2022-05-27 NOTE — Progress Notes (Signed)
  Echocardiogram 2D Echocardiogram has been performed.  Katelyn Powers M      Echocardiogram not complete, physical therapy in the room.   Echocardiogram not complete, husband has arrived to visit with wife.  Katelyn Powers RDCS

## 2022-05-27 NOTE — PMR Pre-admission (Signed)
PMR Admission Coordinator Pre-Admission Assessment  Patient: Katelyn Powers is an 88 y.o., female MRN: [id number] DOB: 13-Apr-1932 Height: 5' 2.5" (158.8 cm) Weight: 71.8 kg  Insurance Information HMO:  yes   PPO:      PCP:      IPA:      80/20:      OTHER:  PRIMARY: U[employer] Medicare      Policy#: [id number]      Group #: [id number]    Pre-Cert#: [id number]       Benefits:  Phone #: [email or web address]  Fax:# [phone]      Name:  Eff. Date:  10/28/21    Deduct:  $0     Out of Pocket Max: $4,500 ( $30 met)       CIR: $325/day co pay for days 1-5, $0/day co pay for days 6+     SNF: $196 /day for days 21-43, $0/day co pay for days 44-100 lim to 100 days per calendar year.  Approved via call, for dates 05/28/22-06/06/22 fax to number above.  Outpatient:  $20/visit co pay     Home Health:  100% coverage, $0 co insurance limited by medical necessity      DME:  80%   20% co-insurance Providers:  SECONDARY:       Policy#:      Phone#:   Development worker, community:       Phone#:   The Therapist, art Information Summary" for patients in Inpatient Rehabilitation Facilities with attached "Privacy Act [place] Records" was provided and verbally reviewed with: N/A  Emergency Contact Information Contact Information     Name Relation Home Work Mobile   Juniel,terrie Daughter   [phone]   Shakiah, Wester Daughter [phone]  [phone]       Current Medical History  Patient Admitting Diagnosis: CVA History of Present Illness: 88 y.o. female presents to [facility] hospital on 05/25/2022 with L weakness, imbalance.  MRI reveals Acute/subacute nonhemorrhagic infarct involving the lateral right thalamus. T2 signal changes are present within the infarct. Advanced atrophy and diffuse white matter disease likely reflects the sequela of chronic microvascular ischemia. Remote encephalomalacia of the inferior right cerebellum. CTA neck also demonstrates High-grade stenosis Right ICA origin and bulb approaching  a RADIOGRAPHIC STRING SIGN. But the anterior circulation is stable from the MRA yesterday, with mild stenosis at both the right MCA and ACA origins.  Expected CT appearance of the right thalamic infarct. No associated hemorrhage or mass effect. No new intracranial abnormality.PMH includes dementia, HTN, HLD, carotid stenosis, NSTEMI, CHF. Per Neurology, 2D echo pending, norvasc resumed 05/26/22, permissive HTN (<220/120) . High sensitivity troponin elevated, but no need of further ischemia work up at this time.  Therapies recommending AIR.     Complete NIHSS TOTAL: 10  Patient's medical record from [facility] has been reviewed by the rehabilitation admission coordinator and physician.  Past Medical History  Past Medical History:  Diagnosis Date   Cataract    Gout    Hyperlipidemia    Hypertension    Memory loss    NSTEMI (non-ST elevated myocardial infarction) ([place]) 08/2020   Right-sided carotid artery disease ([place]) 09/17/2020   carotid doppler wth >60% occlusion    Has the patient had major surgery during 100 days prior to admission? No  Family History   family history includes Colon cancer (age of onset: 35) in her sister; Colon polyps in her brother.  Current Medications  Current Facility-Administered Medications:    acetaminophen (TYLENOL) tablet 650 mg, 650  mg, Oral, Q6H PRN **OR** acetaminophen (TYLENOL) suppository 650 mg, 650 mg, Rectal, Q6H PRN, Shalhoub, Sherryll Burger, MD   amLODipine (NORVASC) tablet 5 mg, 5 mg, Oral, Daily, Charlean Merl, [place], NP, 5 mg at 05/27/22 1950   aspirin EC tablet 81 mg, 81 mg, Oral, Daily, Shalhoub, Sherryll Burger, MD, 81 mg at 05/27/22 0912   clopidogrel (PLAVIX) tablet 75 mg, 75 mg, Oral, Daily, Charlean Merl, [place], NP, 75 mg at 05/27/22 0912   enoxaparin (LOVENOX) injection 40 mg, 40 mg, Subcutaneous, Q24H, Shalhoub, Sherryll Burger, MD, 40 mg at 05/27/22 0912   ezetimibe (ZETIA) tablet 10 mg, 10 mg, Oral, Daily, Shalhoub, Sherryll Burger, MD, 10 mg at 05/27/22 9326    hydrALAZINE (APRESOLINE) injection 10 mg, 10 mg, Intravenous, Q6H PRN, Shalhoub, Sherryll Burger, MD   melatonin tablet 10 mg, 10 mg, Oral, QHS, Shalhoub, Sherryll Burger, MD, 10 mg at 05/26/22 2201   nitroGLYCERIN (NITROSTAT) SL tablet 0.4 mg, 0.4 mg, Sublingual, Q5 min PRN, Shalhoub, Sherryll Burger, MD   ondansetron (ZOFRAN) tablet 4 mg, 4 mg, Oral, Q6H PRN **OR** ondansetron (ZOFRAN) injection 4 mg, 4 mg, Intravenous, Q6H PRN, Shalhoub, Sherryll Burger, MD   polyethylene glycol (MIRALAX / GLYCOLAX) packet 17 g, 17 g, Oral, Daily PRN, Vernelle Emerald, MD, 17 g at 05/27/22 0912   pravastatin (PRAVACHOL) tablet 40 mg, 40 mg, Oral, q1800, Shalhoub, Sherryll Burger, MD, 40 mg at 05/26/22 1759  Patients Current Diet:  Diet Order             Diet Heart Room service appropriate? Yes; Fluid consistency: Thin  Diet effective now                   Precautions / Restrictions Precautions Precautions: Fall Restrictions Weight Bearing Restrictions: No   Has the patient had 2 or more falls or a fall with injury in the past year? No  Prior Activity Level Limited Community (1-2x/wk): mod I  Prior Functional Level Self Care: Did the patient need help bathing, dressing, using the toilet or eating? Needed some help  Indoor Mobility: Did the patient need assistance with walking from room to room (with or without device)? Needed some help  Stairs: Did the patient need assistance with internal or external stairs (with or without device)? Needed some help  Functional Cognition: Did the patient need help planning regular tasks such as shopping or remembering to take medications? Needed some help  Patient Information Are you of Hispanic, Latino/a,or Spanish origin?: A. No, not of Hispanic, Latino/a, or Spanish origin What is your race?: A. White Do you need or want an interpreter to communicate with a doctor or health care staff?: 0. No  Patient's Response To:  Health Literacy and Transportation Is the patient able to  respond to health literacy and transportation needs?: No In the past 12 months, has lack of transportation kept you from medical appointments or from getting medications?: No In the past 12 months, has lack of transportation kept you from meetings, work, or from getting things needed for daily living?: No  Development worker, international aid / [place] Devices/Equipment: None Home Equipment: Conservation officer, nature (2 wheels), [employer] - single point, Control and instrumentation engineer Use: Indicate devices/aids used by the patient prior to current illness, exacerbation or injury? None of the above  Current Functional Level Cognition  Arousal/Alertness: Awake/alert Overall Cognitive Status: History of cognitive impairments - at baseline Orientation Level: Disoriented to time, Disoriented to place General Comments: Pt with advanced dementia at baseline.  Pt follows 1 step commands with increased time. L inattention present. Impaired awareness of deficits and safety. Attention: Sustained Sustained Attention: Impaired Sustained Attention Impairment: Verbal basic, Functional basic Memory: Impaired Memory Impairment: Storage deficit Awareness: Impaired Awareness Impairment: Intellectual impairment Problem Solving: Impaired Safety/Judgment: Impaired    Extremity Assessment (includes Sensation/Coordination)  Upper Extremity Assessment: RUE deficits/detail, LUE deficits/detail RUE Deficits / Details: 3+/5 strength LUE Deficits / Details: 2/5 strength; inatentive, poor spatial awareness, coordination and perception LUE Coordination: decreased fine motor, decreased gross motor  Lower Extremity Assessment: LLE deficits/detail LLE Deficits / Details: 3+/5 strength LLE Coordination: decreased gross motor    ADLs  Overall ADL's : Needs assistance/impaired Eating/Feeding: Total assistance Grooming: Moderate assistance, Wash/dry hands, Wash/dry face, Oral care, Brushing hair Grooming Details (indicate cue type  and reason): set-upA to brush hair, otherwise requiring modA Upper Body Bathing: Moderate assistance, Sitting, Bed level Lower Body Bathing: Total assistance, Bed level, Sit to/from stand Upper Body Dressing : Moderate assistance, Sitting, Bed level Lower Body Dressing: Total assistance, Sitting/lateral leans, Bed level, Cueing for safety, Cueing for sequencing Toilet Transfer: Total assistance, +2 for physical assistance, +2 for safety/equipment Toilet Transfer Details (indicate cue type and reason): standing with maxA +1 but not fully able to extend and relying on OT to keep pt up, barely clearing mattress Toileting- Clothing Manipulation and Hygiene: Total assistance Functional mobility during ADLs: Maximal assistance, +2 for physical assistance, +2 for safety/equipment, Cueing for safety, Cueing for sequencing General ADL Comments: Pt modA to [place] for ADL tasks. Pt can comb hair with set-upA, but unable to state "comb." Pt sitting EOB with fair balance and minguard as needed. Pt currently, standing briefly, but not able ot fully extend and clear bottom from bed.    Mobility  Overal bed mobility: Needs Assistance Bed Mobility: Supine to Sit, Sit to Supine Supine to sit: Max assist, HOB elevated Sit to supine: HOB elevated, Mod assist General bed mobility comments: L lateral lean  inititally and then able to find midline and stay there    Transfers  Overall transfer level: Needs assistance Equipment used:  (gait belt) Transfers: Sit to/from Stand Sit to Stand: From elevated surface, Mod assist General transfer comment: achieved nearly full standing x 3 reps (hips flexed with legs pushing against bed and shoulders slightly forward)    Ambulation / Gait / Stairs / Wheelchair Mobility  Ambulation/Gait General Gait Details: unable to fully stand for pre-gait Pre-gait activities: attempted side steps to left toward head of stretcher, maxA for one step with PT sliding pt's left foot and  totalA to weight shift    Posture / Balance Dynamic Sitting Balance Sitting balance - Comments: minguardA; not requiring physical assist for L lateral lean after initial assist Balance Overall balance assessment: Needs assistance Sitting-balance support: Bilateral upper extremity supported, Feet supported Sitting balance-Leahy Scale: Poor Sitting balance - Comments: minguardA; not requiring physical assist for L lateral lean after initial assist Postural control: Left lateral lean Standing balance support: Bilateral upper extremity supported Standing balance-Leahy Scale: Poor Standing balance comment: mod A for stability    Special needs/care consideration Skin intact    Previous Home Environment (from acute therapy documentation) Living Arrangements: Alone, Other (Comment) (family 24/7)  Lives With: Other (Comment) (daughter one week at a time) Available Help at Discharge: Family, Available 24 hours/day Type of Home: House Home Layout: One level Home Access: Stairs to enter Entrance Stairs-Rails: None Entrance Stairs-Number of Steps: 1 Bathroom Shower/Tub: Sponge bathes at baseline [employer]:  Standard Bathroom Accessibility: Yes Home Care Services: No Additional Comments: [name] family want AIR  Discharge Living Setting Plans for Discharge Living Setting: Patient's home Type of Home at Discharge: House Discharge Home Layout: One level Discharge Home Access: Stairs to enter Entrance Stairs-Number of Steps: 5 Discharge Bathroom Shower/Tub: Tub only Discharge Bathroom Toilet: Handicapped height Discharge Bathroom Accessibility: Yes How Accessible: Accessible via walker Does the patient have any problems obtaining your medications?: No  Social/Family/Support Systems Anticipated Caregiver: Daughters [name] and [name] Anticipated Ambulance person Information: [name] Caregiver Availability: 24/7 Discharge Plan Discussed with Primary Caregiver: Yes Is  Caregiver In Agreement with Plan?: Yes Does Caregiver/Family have Issues with Lodging/Transportation while Pt is in Rehab?: No  Goals Patient/Family Goal for Rehab: mod I with PT, OT, SLP Expected length of stay: 14-21 days Pt/Family Agrees to Admission and willing to participate: Yes Program Orientation Provided & Reviewed with Pt/Caregiver Including Roles  & Responsibilities: Yes  Barriers to Discharge: Insurance for SNF coverage  Decrease burden of Care through IP rehab admission: OtherN/A  Possible need for SNF placement upon discharge: not aniticpated  Patient Condition: I have reviewed medical records from [facility], spoken with  [facility] , and daughter. I met with patient at the bedside for inpatient rehabilitation assessment.  Patient will benefit from ongoing PT and OT, can actively participate in 3 hours of therapy a day 5 days of the week, and can make measurable gains during the admission.  Patient will also benefit from the coordinated team approach during an Inpatient Acute Rehabilitation admission.  The patient will receive intensive therapy as well as Rehabilitation physician, nursing, social worker, and care management interventions.  Due to safety, skin/wound care, disease management, medication administration, and patient education the patient requires 24 hour a day rehabilitation nursing.  The patient is currently Mod A with mobility and basic ADLs.  Discharge setting and therapy post discharge at home with home health is anticipated.  Patient has agreed to participate in the Acute Inpatient Rehabilitation Program and will admit 05/28/22.  Preadmission Screen Completed By:  Amanda Cockayne, 05/27/2022 2:27 PM ______________________________________________________________________   Discussed status with Dr. Naaman Plummer on 05/28/22 at 9:30 am and received approval for admission today.  Admission Coordinator:  Conetta,Kristyn, PT, time 10:20 am/Date 05/28/22   Assessment/Plan: Diagnosis:  CVA Does the need for close, 24 hr/day Medical supervision in concert with the patient's rehab needs make it unreasonable for this patient to be served in a less intensive setting? Yes Co-Morbidities requiring supervision/potential complications: gout, htn, CAD Due to bladder management, bowel management, safety, skin/wound care, disease management, medication administration, pain management, and patient education, does the patient require 24 hr/day rehab nursing? Yes Does the patient require coordinated care of a physician, rehab nurse, PT, OT, and SLP to address physical and functional deficits in the context of the above medical diagnosis(es)? Yes Addressing deficits in the following areas: balance, endurance, locomotion, strength, transferring, bowel/bladder control, bathing, dressing, feeding, grooming, toileting, cognition, speech, and psychosocial support Can the patient actively participate in an intensive therapy program of at least 3 hrs of therapy 5 days a week? Yes The potential for patient to make measurable gains while on inpatient rehab is excellent Anticipated functional outcomes upon discharge from inpatient rehab: modified independent PT, modified independent OT, modified independent SLP Estimated rehab length of stay to reach the above functional goals is: 14-21 days Anticipated discharge destination: Home 10. Overall Rehab/Functional Prognosis: excellent   MD Signature: Meredith Staggers, MD, [place] Physical Medicine &  Rehabilitation Medical Director Rehabilitation Services 05/28/2022

## 2022-05-27 NOTE — Progress Notes (Signed)
PROGRESS NOTE  [name]  DOB: 1932/01/30  PCP: Cyndi Bender, PA-C WIO:[id number]  DOA: 05/25/2022  LOS: 2 days  Hospital Day: 3  Brief narrative: Katelyn Powers is a 88 y.o. female with PMH significant for Alzheimer's dementia, HTN, HLD, CAD, diastolic CHF, mild aortic stenosis, carotid artery stenosis, prior stroke with multiple TIAs Patient presented to the ED on 7/29 with sudden onset of weakness. Per daughter, around 10 AM, patient was sitting on the couch and she "seemed to be leaning towards her left.  Slightly later in the day, at approximately 12 PM patient was noted to have some left-sided weakness when the daughter tried to get her up off the couch.  She also noted that her mother was having difficulty seeing things towards her left.  EMS was called and patient was brought to the ED at around 1:30 PM  In the ED, patient was afebrile, blood pressure was elevated 147/81, breathing on room air Troponin slightly elevated 40, otherwise labs unremarkable. CT scan of head did not show any acute intracranial abnormality but showed remote inferior right cerebellar infarct and a remote subcortical infarct in the anterior right frontal lobe MRI brain showed an acute/subacute nonhemorrhagic infarct involving the lateral right thalamus and chronic microvascular ischemia MRA head showed mild narrowing of the right M1 and A1 segments Seen by neurology Admitted to [facility] service  Subjective: Patient reports no specific complaints.  Family worried about SQ heparin DVT prophylaxis.   Assessment and plan: Acute ischemic stroke of the right lateral thalamus Brought to the ED for left-sided weakness.  On exam, also found to have a left hemianopia and left arm weakness.   Imaging as above Stroke work-up initiated Neurology consult appreciated Pending echo with bubble PTA on aspirin 81 mg daily, Plavix 75 mg daily, statin 40 mg daily  Currently continued on the same PT eval obtained.  Recommend  CIR. Awaiting for inpatient rehab to evaluate for acceptance.   Carotid stenosis, right Known history of right-sided carotid stenosis based on prior imaging, 09/2021 right ICA 65-70% stenosis on CTA Positive for functionally occluded non-dominant and diminutive Right Vertebral Artery V4 segment. Dominant Left Vertebral Artery supplies the basilar without significant stenosis. But severe stenosis at the Left PCA origin. And attenuated bilateral PCA branches which appears more likely due to stenosis than artifact. These findings are concordant with the MRI yesterday.   2. CTA neck also demonstrates High-grade stenosis Right ICA origin and bulb approaching a RADIOGRAPHIC STRING SIGN. But the anterior circulation is stable from the MRA yesterday, with mild stenosis at both the right MCA and ACA origins.   3. Expected CT appearance of the right thalamic infarct. No associated hemorrhage or mass effect. No new intracranial abnormality.   4. Aortic Atherosclerosis (ICD10-I70.0).   Elevated troponin  CAD/HLD High-sensitivity troponin was elevated to 40 but patient with chest pain-free.  Elevated troponin likely due to underlying illness.  No need of further ischemia work-up at this time. Continue antiplatelet and statin Recent Labs    05/25/22 1805  TROPONINIHS 40*   Chronic diastolic CHF (congestive heart failure)  Essential hypertension Euvolemic at this time.  Last echo from 08/2021 with EF 55 to 60% and G1 DD  pending echo as part of stroke work-up PTA on amlodipine 5 mg daily, losartan 50 mg daily Permissive hypertension for now.   Resume gradually   Late onset Alzheimer's disease without behavioral disturbance  Longstanding known history of dementia and cognitive deficit Minimizing uncomfortable  stimuli Minimizing mood altering and sedating agents Encouraging family to remain at bedside is much as possible Frequent redirection by staff Fall precautions  Goals of care    Code Status: Full Code    Mobility: PT eval obtained  Skin assessment:     Nutritional status:  Body mass index is 28.49 kg/m.          Diet:  Diet Order             Diet Heart Room service appropriate? Yes; Fluid consistency: Thin  Diet effective now                   DVT prophylaxis:  enoxaparin (LOVENOX) injection 40 mg Start: 05/26/22 1000   Antimicrobials: None Fluid: None Consultants: Neurology Family Communication: Daughter at bedside  Status is: Inpatient  Continue in-hospital care because: Pending completion of stroke work-up, pending CIR Level of care: Telemetry Medical   Dispo: The patient is from: Home              Anticipated d/c is to: CIR              Patient currently is not medically stable to d/c.   Difficult to place patient No  Infusions:    Scheduled Meds:  amLODipine  5 mg Oral Daily   aspirin EC  81 mg Oral Daily   clopidogrel  75 mg Oral Daily   enoxaparin (LOVENOX) injection  40 mg Subcutaneous Q24H   ezetimibe  10 mg Oral Daily   melatonin  10 mg Oral QHS   pravastatin  40 mg Oral q1800    PRN meds: acetaminophen **OR** acetaminophen, hydrALAZINE, nitroGLYCERIN, ondansetron **OR** ondansetron (ZOFRAN) IV, polyethylene glycol   Antimicrobials: Anti-infectives (From admission, onward)    None       Objective: Vitals:   05/27/22 0406 05/27/22 0812  BP: 103/75 125/61  Pulse: 68 82  Resp: 10 20  Temp: 98 F (36.7 C) 97.7 F (36.5 C)  SpO2: 96% 94%    Intake/Output Summary (Last 24 hours) at 05/27/2022 1157 Last data filed at 05/27/2022 1005 Gross per 24 hour  Intake 480 ml  Output 726 ml  Net -246 ml   Filed Weights   05/26/22 2044  Weight: 71.8 kg   Weight change:  Body mass index is 28.49 kg/m.   Physical Exam: General exam: awake, alert, cooperative, NAD.  Skin: No rashes, lesions or ulcers. HEENT: Atraumatic, normocephalic, no obvious bleeding Lungs: Clear to auscultation bilaterally CVS:  Regular rate and rhythm, no murmur GI/Abd soft, nontender, nondistended, bowel sound present CNS: Alert, awake, unable to answer orientation questions which is her baseline per daughter. Psychiatry: Cheerful, not restless or agitated Extremities: No pedal edema, no calf tenderness  Data Review: I have personally reviewed the laboratory data and studies available.  F/u labs ordered Unresulted Labs (From admission, onward)    None       Signed, Irwin Brakeman, MD [facility] 05/27/2022 How to contact the [facility] Attending or Consulting provider [employer] or covering provider during after hours [place], for this patient?  Check the care team in [facility] and look for a) attending/consulting TRH provider listed and b) the [facility] team listed Log into [email or web address] and use [place]'s universal password to access. If you do not have the password, please contact the hospital operator. Locate the [facility] provider you are looking for under [facility] and page to a number that you can be directly reached. If  you still have difficulty reaching the provider, please page the [facility] (Director on Call) for the Hospitalists listed on amion for assistance.

## 2022-05-27 NOTE — Progress Notes (Signed)
Inpatient Rehab Coordinator Note:  I met with daughter, Lynelle Smoke at bedside to discuss CIR recommendations and goals/expectations of CIR stay.  We reviewed 3 hrs/day of therapy, physician follow up, and average length of stay 2 weeks (dependent upon progress) with goals of min/mod A. Daughter would like to pursue CIR. Waiting for all therapy notes and will send to insurance for prior auth.   Rehab Admissons Coordinator [place], [place], [place] [phone]

## 2022-05-27 NOTE — Progress Notes (Signed)
  Transition of Care [facility]) Screening Note   Patient Details  Name: Katelyn Powers Date of Birth: [birth year]   [facility] of [facility]) CM/SW Contact:    Cyndi Bender, RN Phone Number: 05/27/2022, 8:52 AM    Transition of Care Department [facility]) has reviewed patient. CIR following patient for possible admit.  We will continue to monitor patient advancement through interdisciplinary progression rounds. If new patient transition needs arise, please place a TOC consult.

## 2022-05-27 NOTE — Evaluation (Signed)
Occupational Therapy Evaluation Patient Details Name: Katelyn Powers MRN: [id number] DOB: 12-31-1931 Today's Date: 05/27/2022   History of Present Illness 88 y.o. female presents to [facility] hospital on 05/25/2022 with L weakness, imbalance. Pt found to have R thalamic infarct on MRI. PMH includes dementai, HTN, HLD, carotid stenosis, NSTEMI, CHF.   Clinical Impression   Pt PTA: Pt living with daughters weekly alternating homes. Pt  was mostly set-upA for OOB ADL other than bathing and ambulating without an AD. Pt currently, with deficits in ADL, L sided weakness, L inattention, cognitive deficits and visual deficits.  Pt modA to [place] for ADL tasks. Pt can comb hair with set-upA, but unable to state "comb." Pt sitting EOB with fair balance and minguard as needed. Pt currently, standing briefly, but not able ot fully extend and clear bottom from bed. [name] L side experiencing poor spatial awareness, coordination and perception. With verbal and tactile cues, pt can turn head to L, but very inattentive at this time. Pt requires continued OT skilled services. Pt and family motivated to return to PLOF. OT following acutely.      Recommendations for follow up therapy are one component of a multi-disciplinary discharge planning process, led by the attending physician.  Recommendations may be updated based on patient status, additional functional criteria and insurance authorization.   Follow Up Recommendations  Acute inpatient rehab (3hours/day)    Assistance Recommended at Discharge Frequent or constant Supervision/Assistance  Patient can return home with the following Two people to help with walking and/or transfers;Two people to help with bathing/dressing/bathroom;Assistance with cooking/housework;Assistance with feeding;Assist for transportation;Help with stairs or ramp for entrance;Direct supervision/assist for medications management    Functional Status Assessment  Patient has had a recent decline in  their functional status and demonstrates the ability to make significant improvements in function in a reasonable and predictable amount of time.  Equipment Recommendations  BSC/3in1;Hospital bed;Wheelchair (measurements OT);Wheelchair cushion (measurements OT)    Recommendations for Other Services Rehab consult     Precautions / Restrictions Precautions Precautions: Fall Restrictions Weight Bearing Restrictions: No      Mobility Bed Mobility Overal bed mobility: Needs Assistance Bed Mobility: Supine to Sit, Sit to Supine     Supine to sit: Max assist, HOB elevated Sit to supine: Max assist, HOB elevated   General bed mobility comments: L lateral lean  inititally and then able to find midline and stay there    Transfers Overall transfer level: Needs assistance Equipment used: 1 person hand held assist Transfers: Sit to/from Stand Sit to Stand: Max assist, From elevated surface           General transfer comment: unable to clear bottom from bed and fully extend      Balance Overall balance assessment: Needs assistance Sitting-balance support: Bilateral upper extremity supported, Feet supported Sitting balance-Leahy Scale: Fair Sitting balance - Comments: minguardA; not requiring physical assist for L lateral lean after initial assist Postural control: Left lateral lean Standing balance support: Bilateral upper extremity supported Standing balance-Leahy Scale: Poor Standing balance comment: maxA for stability                           ADL either performed or assessed with clinical judgement   ADL Overall ADL's : Needs assistance/impaired Eating/Feeding: Total assistance   Grooming: Moderate assistance;Wash/dry hands;Wash/dry face;Oral care;Brushing hair Grooming Details (indicate cue type and reason): set-upA to brush hair, otherwise requiring modA Upper Body Bathing: Moderate assistance;Sitting;Bed level  Lower Body Bathing: Total assistance;Bed  level;Sit to/from stand   Upper Body Dressing : Moderate assistance;Sitting;Bed level   Lower Body Dressing: Total assistance;Sitting/lateral leans;Bed level;Cueing for safety;Cueing for sequencing   Toilet Transfer: Total assistance;+2 for physical assistance;+2 for safety/equipment Toilet Transfer Details (indicate cue type and reason): standing with maxA +1 but not fully able to extend and relying on OT to keep pt up, barely clearing mattress Toileting- Clothing Manipulation and Hygiene: Total assistance       Functional mobility during ADLs: Maximal assistance;+2 for physical assistance;+2 for safety/equipment;Cueing for safety;Cueing for sequencing General ADL Comments: Pt modA to [place] for ADL tasks. Pt can comb hair with set-upA, but unable to state "comb." Pt sitting EOB with fair balance and minguard as needed. Pt currently, standing briefly, but not able ot fully extend and clear bottom from bed.     Vision Baseline Vision/History: 1 Wears glasses Ability to See in Adequate Light: 0 Adequate Patient Visual Report: Peripheral vision impairment Vision Assessment?: Vision impaired- to be further tested in functional context;Yes Eye Alignment: Impaired (comment) Ocular Range of Motion: Restricted on the left Alignment/Gaze Preference: Gaze right Tracking/Visual Pursuits: Left eye does not track laterally;Impaired - to be further tested in functional context Additional Comments: With verbal and tactile cues, pt can turn head to L, but very inattentive at this time.     Perception Perception Comments: tactile and verabl cues require to engage the L side.   Praxis      Pertinent Vitals/Pain Pain Assessment Pain Assessment: No/denies pain Faces Pain Scale: No hurt Breathing: normal Negative Vocalization: none Facial Expression: smiling or inexpressive Body Language: relaxed Consolability: no need to console PAINAD Score: 0 Pain Intervention(s): Monitored during session,  Repositioned     Hand Dominance Right   Extremity/Trunk Assessment Upper Extremity Assessment Upper Extremity Assessment: RUE deficits/detail;LUE deficits/detail RUE Deficits / Details: 3+/5 strength LUE Deficits / Details: 2/5 strength; inatentive, poor spatial awareness, coordination and perception LUE Coordination: decreased fine motor;decreased gross motor   Lower Extremity Assessment Lower Extremity Assessment: LLE deficits/detail LLE Deficits / Details: 3+/5 strength LLE Coordination: decreased gross motor   Cervical / Trunk Assessment Cervical / Trunk Assessment: Kyphotic   Communication Communication Communication: Expressive difficulties   Cognition Arousal/Alertness: Lethargic Behavior During Therapy: Flat affect Overall Cognitive Status: History of cognitive impairments - at baseline                                 General Comments: Pt with advanced dementia at baseline. Pt follows 1 step commands with increased time. L inattention present. Impaired awareness of deficits and safety.     General Comments  VSS.    Exercises     Shoulder Instructions      Home Living Family/patient expects to be discharged to:: Private residence Living Arrangements: Alone;Other (Comment) (family 24/7) Available Help at Discharge: Family;Available 24 hours/day Type of Home: House Home Access: Stairs to enter [employer] of Steps: 1 Entrance Stairs-Rails: None Home Layout: One level     Bathroom Shower/Tub: Sponge bathes at baseline   Bathroom Toilet: Standard Bathroom Accessibility: Yes   Home Equipment: Conservation officer, nature (2 wheels);Cane - single point;Shower seat   Additional Comments: [name] family want AIR  Lives With: Other (Comment) (daughter one week at a time)    Prior Functioning/Environment Prior Level of Function : Needs assist             Mobility  Comments: pt ambulates without use of an assistive device ADLs Comments: pt  requires assistance with bathing and IADLs        OT Problem List: Decreased strength;Decreased range of motion;Impaired balance (sitting and/or standing);Decreased activity tolerance;Decreased coordination;Decreased cognition;Decreased safety awareness;Impaired UE functional use      OT Treatment/Interventions: Self-care/ADL training;Therapeutic exercise;Neuromuscular education;Energy conservation;DME and/or AE instruction;Therapeutic activities;Cognitive remediation/compensation;Visual/perceptual remediation/compensation;Patient/family education;Balance training    OT Goals(Current goals can be found in the care plan section) Acute Rehab OT Goals Patient Stated Goal: family: to go to AIR OT Goal Formulation: With patient/family Time For Goal Achievement: 06/10/22 Potential to Achieve Goals: Good ADL Goals Pt Will Perform Grooming: with supervision;sitting Pt Will Transfer to Toilet: with mod assist;stand pivot transfer;bedside commode Pt/caregiver will Perform Home Exercise Program: Increased strength;Left upper extremity;With written HEP provided;With minimal assist Additional ADL Goal #1: Pt will follow 1-2 step commands with 80% accuracy with verbal cues for accuracy.  OT Frequency: Min 2X/week    Co-evaluation              AM-PAC OT "6 Clicks" Daily Activity     Outcome Measure Help from another person eating meals?: Total Help from another person taking care of personal grooming?: A Lot Help from another person toileting, which includes using toliet, bedpan, or urinal?: Total Help from another person bathing (including washing, rinsing, drying)?: A Lot Help from another person to put on and taking off regular upper body clothing?: A Lot Help from another person to put on and taking off regular lower body clothing?: Total 6 Click Score: 9   End of Session Equipment Utilized During Treatment: Gait belt Nurse Communication: Mobility status  Activity Tolerance: Patient  limited by fatigue;Patient limited by lethargy Patient left: in bed;with call bell/phone within reach;with family/visitor present;Other (comment) (family about to give pt a bath- asked family to lower bed, call for NT or RN to reset bed alarm and put purewick in place if it moved.)  OT Visit Diagnosis: Unsteadiness on feet (R26.81);Muscle weakness (generalized) (M62.81);Other symptoms and signs involving cognitive function;Hemiplegia and hemiparesis Hemiplegia - Right/Left: Left Hemiplegia - dominant/non-dominant: Non-Dominant Hemiplegia - caused by: Cerebral infarction                Time: 0762-2633 OT Time Calculation (min): 43 min Charges:  OT General Charges $OT Visit: 1 Visit OT Evaluation $OT Eval Moderate Complexity: 1 Mod OT Treatments $Self Care/Home Management : 8-22 mins $Neuromuscular Re-education: 8-22 mins  Jefferey Pica, OTR/L Acute Rehabilitation Services Office: [phone]   [id number] J GOTLX 05/27/2022, 1:19 PM

## 2022-05-27 NOTE — Plan of Care (Signed)

## 2022-05-27 NOTE — Progress Notes (Signed)
Physical Therapy Treatment Patient Details Name: Katelyn Powers MRN: [id number] DOB: Dec 29, 1931 Today's Date: 05/27/2022   History of Present Illness 88 y.o. female [facility] on 05/25/2022 with L weakness, imbalance. Pt found to have R thalamic infarct on MRI. PMH includes dementai, HTN, HLD, carotid stenosis, NSTEMI, CHF.    PT Comments    Patient alert and sitting upright in bed on arrival. Worked on attending to left side throughout session and at times pt would turn head/eyes past midline and at times required assist to turn and see object or body part on her left. Patient moving LLE on command (multi-modal). Able to progress to sitting at EOB with min guard assist and stood x 3 from EOB (nearly full standing but with posterior legs braced against bed frame). Will need +2 to progress to transfer OOB (?using Stedy).     Recommendations for follow up therapy are one component of a multi-disciplinary discharge planning process, led by the attending physician.  Recommendations may be updated based on patient status, additional functional criteria and insurance authorization.  Follow Up Recommendations  Acute inpatient rehab (3hours/day)     Assistance Recommended at Discharge Frequent or constant Supervision/Assistance  Patient can return home with the following Two people to help with walking and/or transfers;Two people to help with bathing/dressing/bathroom;Assistance with cooking/housework;Assistance with feeding;Direct supervision/assist for medications management;Direct supervision/assist for financial management;Help with stairs or ramp for entrance;Assist for transportation   Equipment Recommendations  Wheelchair (measurements PT);Wheelchair cushion (measurements PT);Hospital bed    Recommendations for Other Services       Precautions / Restrictions Precautions Precautions: Fall Restrictions Weight Bearing Restrictions: No     Mobility  Bed Mobility Overal bed  mobility: Needs Assistance Bed Mobility: Supine to Sit, Sit to Supine     Supine to sit: Max assist, HOB elevated Sit to supine: HOB elevated, Mod assist   General bed mobility comments: L lateral lean  inititally and then able to find midline and stay there    Transfers Overall transfer level: Needs assistance Equipment used:  (gait belt) Transfers: Sit to/from Stand Sit to Stand: From elevated surface, Mod assist           General transfer comment: achieved nearly full standing x 3 reps (hips flexed with legs pushing against bed and shoulders slightly forward)    Ambulation/Gait               General Gait Details: unable to fully stand for pre-gait   Stairs             Wheelchair Mobility    Modified Rankin (Stroke Patients Only) Modified Rankin (Stroke Patients Only) Pre-Morbid Rankin Score: Moderate disability Modified Rankin: Severe disability     Balance Overall balance assessment: Needs assistance Sitting-balance support: Bilateral upper extremity supported, Feet supported Sitting balance-Leahy Scale: Poor Sitting balance - Comments: minguardA; not requiring physical assist for L lateral lean after initial assist Postural control: Left lateral lean Standing balance support: Bilateral upper extremity supported Standing balance-Leahy Scale: Poor Standing balance comment: mod A for stability                            Cognition Arousal/Alertness: Awake/alert Behavior During Therapy: Flat affect Overall Cognitive Status: History of cognitive impairments - at baseline  General Comments: Pt with advanced dementia at baseline. Pt follows 1 step commands with increased time. L inattention present. Impaired awareness of deficits and safety.        Exercises General Exercises - Lower Extremity Short Arc Quad: AROM, Left, 5 reps Long Arc Quad: AROM, Both, 5 reps, Seated    General  Comments General comments (skin integrity, edema, etc.): Daughter present. VSS per monitor      Pertinent Vitals/Pain Pain Assessment Pain Assessment: Faces Faces Pain Scale: No hurt Breathing: normal Negative Vocalization: none Facial Expression: smiling or inexpressive Body Language: relaxed Consolability: no need to console PAINAD Score: 0    Home Living Family/patient expects to be discharged to:: Private residence Living Arrangements: Alone;Other (Comment) (family 24/7) Available Help at Discharge: Family;Available 24 hours/day Type of Home: House Home Access: Stairs to enter Entrance Stairs-Rails: None Entrance Stairs-Number of Steps: 1   Home Layout: One level Home Equipment: Conservation officer, nature (2 wheels);Cane - single point;Shower seat Additional Comments: [name] family want AIR    Prior Function            PT Goals (current goals can now be found in the care plan section) Acute Rehab PT Goals Patient Stated Goal: family goal to return home and improve mobility quality Time For Goal Achievement: 06/09/22 Potential to Achieve Goals: Fair Progress towards PT goals: Progressing toward goals    Frequency    Min 4X/week      PT Plan Current plan remains appropriate    Co-evaluation              AM-PAC PT "6 Clicks" Mobility   Outcome Measure  Help needed turning from your back to your side while in a flat bed without using bedrails?: A Lot Help needed moving from lying on your back to sitting on the side of a flat bed without using bedrails?: A Lot Help needed moving to and from a bed to a chair (including a wheelchair)?: Total Help needed standing up from a chair using your arms (e.g., wheelchair or bedside chair)?: A Lot Help needed to walk in hospital room?: Total Help needed climbing 3-5 steps with a railing? : Total 6 Click Score: 9    End of Session Equipment Utilized During Treatment: Gait belt Activity Tolerance: Patient tolerated treatment  well Patient left: in bed;with call bell/phone within reach;with family/visitor present   PT Visit Diagnosis: Other abnormalities of gait and mobility (R26.89);Muscle weakness (generalized) (M62.81);Other symptoms and signs involving the nervous system (R29.898)     Time: 2376-2831 PT Time Calculation (min) (ACUTE ONLY): 24 min  Charges:  $Therapeutic Activity: 8-22 mins $Neuromuscular Re-education: 8-22 mins                      [facility]  Office [phone]    Rexanne Mano 05/27/2022, 2:21 PM

## 2022-05-27 NOTE — Progress Notes (Signed)
STROKE TEAM PROGRESS NOTE   INTERVAL HISTORY Her daughter is at the bedside along with physical therapy. CTA complete today.  It shows string sign in the right ICA but patient is refusing carotid revascularization again and has done so in the past also..  Per patient's daughter at bedside, her physical examination is much improved compared to yesterday. Agreeable to ASA and Plavix Attempted Brilinta previously, concern for intolerance per patient's daughter with concern for increased confusion. Family expresses that they do not want to try [place] again Follows with Dr. Felecia Shelling outpatient  Vital signs are stable.  Neurological exam is unchanged. Vitals:   05/27/22 0000 05/27/22 0406 05/27/22 0812 05/27/22 1231  BP: (!) 115/58 103/75 125/61 132/79  Pulse: 70 68 82 77  Resp: '10 10 20 12  '$ Temp: 97.8 F (36.6 C) 98 F (36.7 C) 97.7 F (36.5 C) 98.2 F (36.8 C)  TempSrc: Axillary Axillary Oral Oral  SpO2: 95% 96% 94% 95%  Weight:      Height:       CBC:  Recent Labs  Lab 05/25/22 1805 05/25/22 2017 05/26/22 0742  WBC 10.1  --  8.8  NEUTROABS 7.1  --  6.1  HGB 14.2 13.9 13.2  HCT 42.9 41.0 39.3  MCV 90.3  --  89.5  PLT 236  --  622   Basic Metabolic Panel:  Recent Labs  Lab 05/25/22 1805 05/25/22 2017 05/26/22 0742  NA 139 136 139  K 4.0 4.7 4.2  CL 106 109 106  CO2 22  --  25  GLUCOSE 111* 96 113*  BUN 21 29* 17  CREATININE 0.98 1.00 1.07*  CALCIUM 9.8  --  9.7  MG  --   --  2.1   Lipid Panel:  Recent Labs  Lab 05/26/22 0742  CHOL 137  TRIG 181*  HDL 46  CHOLHDL 3.0  VLDL 36  LDLCALC 55   HgbA1c:  Recent Labs  Lab 05/26/22 0742  HGBA1C 6.3*   Urine Drug Screen:  Recent Labs  Lab 05/26/22 0048  LABOPIA NONE DETECTED  COCAINSCRNUR NONE DETECTED  LABBENZ NONE DETECTED  AMPHETMU NONE DETECTED  THCU NONE DETECTED  LABBARB NONE DETECTED    Alcohol Level  Recent Labs  Lab 05/26/22 0742  ETH <10    IMAGING past 24 hours No results  found.  PHYSICAL EXAM Pleasant elderly Caucasian lady not in distress. . Afebrile. Head is nontraumatic. Neck is supple without bruit.    Cardiac exam no murmur or gallop. Lungs are clear to auscultation. Distal pulses are well felt.  GENERAL: Awake, alert, in NAD HEENT: - Normocephalic and atraumatic, dry mm LUNGS - Respirations regular and unlabored on room air CV - Extremities warm, well perfused, without edema   NEURO:  Mental Status: AA&O to self (baseline due to Alzheimer's dementia). Does not answer further orientation questions.  Follows simple commands intermittently with repeat instruction.  Language: speech is without dysarthria Was able to name "glasses" after repeat instruction, other objects she was not able to identify and that seems to be her baseline.  Cranial Nerves: PERRL, EOMI, visual fields left hemianopia, no facial asymmetry, facial sensation intact, hearing intact, tongue/uvula/soft palate midline, normal sternocleidomastoid and trapezius muscle strength. No evidence of tongue atrophy or fasciculations Motor: Exam somewhat limited by patient's ability to follow commands. Left arm drifts but does not hit the bed, 5/5, left leg 4/5, right arm 5/5, right leg 4/5. Grip strengths are equal. Left side weakness at baseline per  patient's daughter.  Tone: normal and bulk is normal Sensation: Intact and symmetric to light touch Coordination: FTN with slight ataxia on the left though she has weakness on the left at baseline Gait: deferred for patient safety   ASSESSMENT/PLAN Katelyn Powers is a 88 y.o. female with history of Alzheimer's dementia, HTN, HLD, CAD, right carotid stenosis, and history of TIA and MI who presented to the ED via EMS after they were called for difficulty ambulating. Upon further discussion with family it was discovered that she had been leaning towards the left and had vision changes with her left eye. At 1554, In the ED Code stroke called by MD for left  side weakness.   Stroke: Right thalamic infarct Etiology:  likely secondary to large vessel stenosis versus small vessel disease Code Stroke CT head No acute abnormality.  CTA head & neck Positive for functionally occluded non-dominant and diminutive Right Vertebral Artery V4 segment. Dominant Left Vertebral Artery supplies the basilar without significant stenosis. Severe stenosis at the Left PCA origin. And attenuated bilateral PCA branches which appears more likely due to stenosis MRI  Acute/subacute nonhemorrhagic infarct involving the lateral right thalamus 2D Echo pending LDL 135 HgbA1c 5.9 VTE prophylaxis - Lovenox    Diet   Diet Heart Room service appropriate? Yes; Fluid consistency: Thin   aspirin 81 mg daily and clopidogrel 75 mg daily prior to admission, now on aspirin 81 mg daily and clopidogrel 75 mg daily.  Therapy recommendations:  CIR Disposition:  pending  Hypertension Home meds:  losartan, norvasc Norvasc resumed 7/30 Stable Permissive hypertension (OK if < 220/120) but gradually normalize in 5-7 days Long-term BP goal normotensive  Hyperlipidemia Home meds:  Pravastatin and Zetia resumed in hospital LDL 135, goal < 70  Continue statin at discharge  Other Stroke Risk Factors Advanced Age >/= 68  Coronary artery disease ASA and plavix with PRN nitro for chest pain  Diastolic CHF  Other Active Problems Alzheimer's dementia Follows with Dr. Felecia Shelling with [facility] day # 2  Patient presented with right thalamic infarct and has severe right carotid stenosis with string sign but has refused carotid revascularization in the past and continues to do so.  She has not tolerated Brilinta in the past hence continue aspirin and Plavix dual antiplatelet therapy as well as aggressive risk factor modification.  Check echocardiogram.  Continue ongoing therapies.  Transfer to inpatient rehab when bed available.  Follow-up as an outpatient with Dr. Felecia Shelling and [facility].  Stroke  team will sign off.  Kindly call for questions. Greater than 50% time during this 35-minute visit was spent in counseling and coordination of care about her stroke and carotid stenosis and discussion about revascularization medical treatment options and answering questions.  Discussed with Dr. Darleene Cleaver, MD To contact Stroke Continuity provider, please refer to [email or web address] After hours, contact General Neurology

## 2022-05-28 ENCOUNTER — Inpatient Hospital Stay (HOSPITAL_COMMUNITY)
Admission: RE | Admit: 2022-05-28 | Discharge: 2022-06-19 | DRG: 057 | Disposition: A | Discharge status: Hospice | Payer: Medicare Other | Source: Intra-hospital | Attending: Physical Medicine & Rehabilitation | Admitting: Physical Medicine & Rehabilitation

## 2022-05-28 ENCOUNTER — Encounter (HOSPITAL_COMMUNITY): Payer: Self-pay | Admitting: Physical Medicine & Rehabilitation

## 2022-05-28 ENCOUNTER — Other Ambulatory Visit: Payer: Self-pay

## 2022-05-28 DIAGNOSIS — D72829 Elevated white blood cell count, unspecified: Secondary | ICD-10-CM | POA: Diagnosis not present

## 2022-05-28 DIAGNOSIS — I252 Old myocardial infarction: Secondary | ICD-10-CM | POA: Diagnosis not present

## 2022-05-28 DIAGNOSIS — I6521 Occlusion and stenosis of right carotid artery: Secondary | ICD-10-CM | POA: Diagnosis present

## 2022-05-28 DIAGNOSIS — I69320 Aphasia following cerebral infarction: Secondary | ICD-10-CM | POA: Diagnosis not present

## 2022-05-28 DIAGNOSIS — I69312 Visuospatial deficit and spatial neglect following cerebral infarction: Secondary | ICD-10-CM

## 2022-05-28 DIAGNOSIS — E785 Hyperlipidemia, unspecified: Secondary | ICD-10-CM | POA: Diagnosis present

## 2022-05-28 DIAGNOSIS — G4700 Insomnia, unspecified: Secondary | ICD-10-CM | POA: Diagnosis present

## 2022-05-28 DIAGNOSIS — G309 Alzheimer's disease, unspecified: Secondary | ICD-10-CM | POA: Diagnosis present

## 2022-05-28 DIAGNOSIS — E782 Mixed hyperlipidemia: Secondary | ICD-10-CM | POA: Diagnosis present

## 2022-05-28 DIAGNOSIS — F02B0 Dementia in other diseases classified elsewhere, moderate, without behavioral disturbance, psychotic disturbance, mood disturbance, and anxiety: Secondary | ICD-10-CM

## 2022-05-28 DIAGNOSIS — G301 Alzheimer's disease with late onset: Secondary | ICD-10-CM

## 2022-05-28 DIAGNOSIS — B9620 Unspecified Escherichia coli [E. coli] as the cause of diseases classified elsewhere: Secondary | ICD-10-CM | POA: Diagnosis not present

## 2022-05-28 DIAGNOSIS — Z79899 Other long term (current) drug therapy: Secondary | ICD-10-CM

## 2022-05-28 DIAGNOSIS — F0150 Vascular dementia without behavioral disturbance: Secondary | ICD-10-CM | POA: Diagnosis present

## 2022-05-28 DIAGNOSIS — Z7409 Other reduced mobility: Secondary | ICD-10-CM | POA: Diagnosis present

## 2022-05-28 DIAGNOSIS — I69398 Other sequelae of cerebral infarction: Secondary | ICD-10-CM | POA: Diagnosis not present

## 2022-05-28 DIAGNOSIS — Z7401 Bed confinement status: Secondary | ICD-10-CM | POA: Diagnosis not present

## 2022-05-28 DIAGNOSIS — I110 Hypertensive heart disease with heart failure: Secondary | ICD-10-CM | POA: Diagnosis not present

## 2022-05-28 DIAGNOSIS — F0280 Dementia in other diseases classified elsewhere without behavioral disturbance: Secondary | ICD-10-CM | POA: Diagnosis not present

## 2022-05-28 DIAGNOSIS — I10 Essential (primary) hypertension: Secondary | ICD-10-CM | POA: Diagnosis present

## 2022-05-28 DIAGNOSIS — N3942 Incontinence without sensory awareness: Secondary | ICD-10-CM | POA: Diagnosis not present

## 2022-05-28 DIAGNOSIS — R8271 Bacteriuria: Secondary | ICD-10-CM | POA: Diagnosis not present

## 2022-05-28 DIAGNOSIS — Z7902 Long term (current) use of antithrombotics/antiplatelets: Secondary | ICD-10-CM

## 2022-05-28 DIAGNOSIS — Z800 Family history of malignant neoplasm of digestive organs: Secondary | ICD-10-CM

## 2022-05-28 DIAGNOSIS — Z7189 Other specified counseling: Secondary | ICD-10-CM | POA: Diagnosis not present

## 2022-05-28 DIAGNOSIS — R778 Other specified abnormalities of plasma proteins: Secondary | ICD-10-CM | POA: Diagnosis not present

## 2022-05-28 DIAGNOSIS — Z743 Need for continuous supervision: Secondary | ICD-10-CM | POA: Diagnosis not present

## 2022-05-28 DIAGNOSIS — I5032 Chronic diastolic (congestive) heart failure: Secondary | ICD-10-CM | POA: Diagnosis not present

## 2022-05-28 DIAGNOSIS — H53462 Homonymous bilateral field defects, left side: Secondary | ICD-10-CM | POA: Diagnosis present

## 2022-05-28 DIAGNOSIS — R7989 Other specified abnormal findings of blood chemistry: Secondary | ICD-10-CM | POA: Diagnosis not present

## 2022-05-28 DIAGNOSIS — Z7982 Long term (current) use of aspirin: Secondary | ICD-10-CM

## 2022-05-28 DIAGNOSIS — I6381 Other cerebral infarction due to occlusion or stenosis of small artery: Secondary | ICD-10-CM | POA: Diagnosis not present

## 2022-05-28 DIAGNOSIS — R414 Neurologic neglect syndrome: Secondary | ICD-10-CM | POA: Diagnosis not present

## 2022-05-28 DIAGNOSIS — Z85820 Personal history of malignant melanoma of skin: Secondary | ICD-10-CM

## 2022-05-28 DIAGNOSIS — R32 Unspecified urinary incontinence: Secondary | ICD-10-CM | POA: Diagnosis present

## 2022-05-28 DIAGNOSIS — I69334 Monoplegia of upper limb following cerebral infarction affecting left non-dominant side: Secondary | ICD-10-CM | POA: Diagnosis not present

## 2022-05-28 DIAGNOSIS — Z515 Encounter for palliative care: Secondary | ICD-10-CM | POA: Diagnosis not present

## 2022-05-28 DIAGNOSIS — I2510 Atherosclerotic heart disease of native coronary artery without angina pectoris: Secondary | ICD-10-CM | POA: Diagnosis present

## 2022-05-28 DIAGNOSIS — Z8371 Family history of colonic polyps: Secondary | ICD-10-CM

## 2022-05-28 DIAGNOSIS — R413 Other amnesia: Secondary | ICD-10-CM | POA: Diagnosis present

## 2022-05-28 DIAGNOSIS — N649 Disorder of breast, unspecified: Secondary | ICD-10-CM | POA: Diagnosis present

## 2022-05-28 DIAGNOSIS — Z87891 Personal history of nicotine dependence: Secondary | ICD-10-CM

## 2022-05-28 DIAGNOSIS — I639 Cerebral infarction, unspecified: Secondary | ICD-10-CM | POA: Diagnosis not present

## 2022-05-28 DIAGNOSIS — F02B2 Dementia in other diseases classified elsewhere, moderate, with psychotic disturbance: Secondary | ICD-10-CM | POA: Diagnosis not present

## 2022-05-28 DIAGNOSIS — R339 Retention of urine, unspecified: Secondary | ICD-10-CM | POA: Diagnosis not present

## 2022-05-28 MED ORDER — MELATONIN 5 MG PO TABS
10.0000 mg | ORAL_TABLET | Freq: Every day | ORAL | Status: DC
  Administered 2022-05-28 – 2022-06-06 (×10): 10 mg via ORAL
  Filled 2022-05-28 (×10): qty 2

## 2022-05-28 MED ORDER — ALUM & MAG HYDROXIDE-SIMETH 200-200-20 MG/5ML PO SUSP: 30.0000 mL | ORAL | Status: DC | PRN

## 2022-05-28 MED ORDER — EZETIMIBE 10 MG PO TABS
10.0000 mg | ORAL_TABLET | Freq: Every day | ORAL | Status: DC
  Administered 2022-05-28 – 2022-06-18 (×22): 10 mg via ORAL
  Filled 2022-05-28 (×22): qty 1

## 2022-05-28 MED ORDER — LIVING WELL WITH DIABETES BOOK
Freq: Once | Status: AC
  Filled 2022-05-28: qty 1

## 2022-05-28 MED ORDER — ENOXAPARIN SODIUM 40 MG/0.4ML IJ SOSY: 40.0000 mg | PREFILLED_SYRINGE | INTRAMUSCULAR | Status: DC

## 2022-05-28 MED ORDER — BISACODYL 10 MG RE SUPP
10.0000 mg | Freq: Once | RECTAL | Status: AC
  Administered 2022-05-28: 10 mg via RECTAL
  Filled 2022-05-28: qty 1

## 2022-05-28 MED ORDER — METHOCARBAMOL 500 MG PO TABS: 500.0000 mg | ORAL_TABLET | Freq: Four times a day (QID) | ORAL | Status: DC | PRN

## 2022-05-28 MED ORDER — PROCHLORPERAZINE EDISYLATE 10 MG/2ML IJ SOLN: 5.0000 mg | Freq: Four times a day (QID) | INTRAMUSCULAR | Status: DC | PRN

## 2022-05-28 MED ORDER — ENOXAPARIN SODIUM 40 MG/0.4ML IJ SOSY
40.0000 mg | PREFILLED_SYRINGE | INTRAMUSCULAR | Status: DC
  Administered 2022-05-29: 40 mg via SUBCUTANEOUS
  Filled 2022-05-28: qty 0.4

## 2022-05-28 MED ORDER — ASPIRIN 81 MG PO TBEC
81.0000 mg | DELAYED_RELEASE_TABLET | Freq: Every day | ORAL | Status: DC
  Administered 2022-05-29 – 2022-06-19 (×22): 81 mg via ORAL
  Filled 2022-05-28 (×22): qty 1

## 2022-05-28 MED ORDER — TRAZODONE HCL 50 MG PO TABS: 25.0000 mg | ORAL_TABLET | Freq: Every evening | ORAL | Status: DC | PRN

## 2022-05-28 MED ORDER — PROCHLORPERAZINE 25 MG RE SUPP: 12.5000 mg | Freq: Four times a day (QID) | RECTAL | Status: DC | PRN

## 2022-05-28 MED ORDER — MAGNESIUM HYDROXIDE 400 MG/5ML PO SUSP
30.0000 mL | Freq: Every day | ORAL | Status: DC | PRN
  Administered 2022-06-16: 30 mL via ORAL
  Filled 2022-05-28: qty 30

## 2022-05-28 MED ORDER — BLOOD PRESSURE CONTROL BOOK
Freq: Once | Status: AC
  Filled 2022-05-28: qty 1

## 2022-05-28 MED ORDER — PRAVASTATIN SODIUM 40 MG PO TABS
40.0000 mg | ORAL_TABLET | Freq: Every day | ORAL | Status: DC
  Administered 2022-05-28 – 2022-06-18 (×22): 40 mg via ORAL
  Filled 2022-05-28 (×22): qty 1

## 2022-05-28 MED ORDER — FLEET ENEMA 7-19 GM/118ML RE ENEM: 1.0000 | ENEMA | Freq: Once | RECTAL | Status: DC | PRN

## 2022-05-28 MED ORDER — PROCHLORPERAZINE MALEATE 5 MG PO TABS: 5.0000 mg | ORAL_TABLET | Freq: Four times a day (QID) | ORAL | Status: DC | PRN

## 2022-05-28 MED ORDER — BISACODYL 10 MG RE SUPP: 10.0000 mg | Freq: Every day | RECTAL | Status: DC | PRN

## 2022-05-28 MED ORDER — CLOPIDOGREL BISULFATE 75 MG PO TABS
75.0000 mg | ORAL_TABLET | Freq: Every day | ORAL | Status: DC
  Administered 2022-05-29 – 2022-06-19 (×22): 75 mg via ORAL
  Filled 2022-05-28 (×22): qty 1

## 2022-05-28 MED ORDER — GUAIFENESIN-DM 100-10 MG/5ML PO SYRP: 5.0000 mL | ORAL_SOLUTION | Freq: Four times a day (QID) | ORAL | Status: DC | PRN

## 2022-05-28 MED ORDER — DIPHENHYDRAMINE HCL 12.5 MG/5ML PO ELIX
12.5000 mg | ORAL_SOLUTION | Freq: Four times a day (QID) | ORAL | Status: DC | PRN
  Administered 2022-06-08: 25 mg via ORAL
  Filled 2022-05-28: qty 10

## 2022-05-28 MED ORDER — AMLODIPINE BESYLATE 5 MG PO TABS
5.0000 mg | ORAL_TABLET | Freq: Every day | ORAL | Status: DC
  Administered 2022-05-29 – 2022-06-19 (×22): 5 mg via ORAL
  Filled 2022-05-28 (×22): qty 1

## 2022-05-28 MED ORDER — ACETAMINOPHEN 325 MG PO TABS
325.0000 mg | ORAL_TABLET | ORAL | Status: DC | PRN
  Administered 2022-06-15: 650 mg via ORAL
  Filled 2022-05-28: qty 2

## 2022-05-28 NOTE — Care Management Important Message (Signed)
Important Message  Patient Details  Name: Katelyn Powers MRN: [id number] Date of Birth: 1932/04/24   Medicare Important Message Given:  Yes  Patient left prior to IM delivery will mail to the patient home address.    Suvan Stcyr 05/28/2022, 3:24 PM

## 2022-05-28 NOTE — Progress Notes (Signed)
Family member (daughter) upset with nursing for coming into room to check pts brief. Wants pt to sleep without being woken up. Also does not want nursing to wake pt up to be turned. Pt brief checked at 2310 and dry. Pt family member does not want nursing to wake pt up until 5 or 6 am. Family member informed of unit policy and procedures regarding turning and toileting. Daughter states she is aware of possible skin deterioration but continued to refuse nursing to wake pt.

## 2022-05-28 NOTE — Plan of Care (Signed)
  Problem: Clinical Measurements: Goal: Will remain free from infection Outcome: Progressing Goal: Diagnostic test results will improve Outcome: Progressing Goal: Respiratory complications will improve Outcome: Progressing Goal: Cardiovascular complication will be avoided Outcome: Progressing   Problem: Activity: Goal: Risk for activity intolerance will decrease Outcome: Progressing   Problem: Ischemic Stroke/TIA Tissue Perfusion: Goal: Complications of ischemic stroke/TIA will be minimized Outcome: Progressing   Problem: Nutrition: Goal: Risk of aspiration will decrease Outcome: Progressing Goal: Dietary intake will improve Outcome: Progressing   Problem: Coping: Goal: Will verbalize positive feelings about self Outcome: Progressing Goal: Will identify appropriate support needs Outcome: Progressing   Problem: Education: Goal: Knowledge of disease or condition will improve Outcome: Progressing Goal: Knowledge of secondary prevention will improve (SELECT ALL) Outcome: Progressing   Problem: Skin Integrity: Goal: Risk for impaired skin integrity will decrease Outcome: Progressing   Problem: Safety: Goal: Ability to remain free from injury will improve Outcome: Progressing   Problem: Pain Managment: Goal: General experience of comfort will improve Outcome: Progressing   Problem: Elimination: Goal: Will not experience complications related to bowel motility Outcome: Progressing Goal: Will not experience complications related to urinary retention Outcome: Progressing

## 2022-05-28 NOTE — H&P (Signed)
Physical Medicine and Rehabilitation Admission H&P     CC: Functional deficits secondary to right thalamic infarct   HPI: Katelyn Powers is an 88 year old female with a history of Alzheimer's dementia who developed acute onset of generalized weakness, left vision deficit and strong smelling urine on 05/25/2022. EMS was called and she presented to the [facility] ED with left sided weakness.  Began work-up for UT. Family remained concerned for possible stroke and code stroke was called. [name] was consulted. The patient has had her baseline cognition for approximately 2 years and was able to feed and dress herself prior to admission.  She has 24 hour assistance from family.    Neurology discussed changing therapy to Brilinta, however this was deferred by the family citing previous intolerance of this medication notably increased confusion.  SLP evaluation obtained and the patient is currently tolerating a heart healthy diet with medications crushed and administered in applesauce.  She has a PureWick in place for urinary incontinence and urinalysis on 7/30 revealed straw-colored urine, protein 100 mg/dL and rare bacteria.   Medical history is significant for prior stoke December, 2022 with tenecteplase administration with excellent outcome. Residual expressive aphasia and decline in memory. She is followed by [name]. Discussed stenting of right ICA due to 60-79% stenosis on 01/14/2022. This was deferred. She is maintained at home on Plavix and aspirin. History of melanoma.   The patient requires inpatient physical medicine and rehabilitation evaluations and treatment secondary to dysfunction due to right thalamic infarct.       Review of Systems  Reason unable to perform ROS: limited due to dementia.  Constitutional:  Negative for fever.  Eyes:  Positive for blurred vision.       Diminished vision in left eye  Respiratory:  Negative for cough.   Cardiovascular:  Negative for chest  pain.  Gastrointestinal:  Positive for constipation. Negative for vomiting.       Just given suppository  Genitourinary:        Purewick in place; clear, straw colored urine  Musculoskeletal:        No reports of pain PTA  Skin:        Daughter reports crusted area to left nipple  Neurological:  Negative for headaches.        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 She has never used smokeless tobacco. She reports that she does not drink alcohol and does not use drugs. Allergies: No Known Allergies       Medications Prior to Admission  Medication Sig Dispense Refill   amLODipine (NORVASC) 5 MG tablet Take 1 tablet (5 mg total)  by mouth daily.       aspirin EC 81 MG EC tablet Take 1 tablet (81 mg total) by mouth daily. Swallow whole. 30 tablet 11   Cholecalciferol (VITAMIN D3 PO) Take 1 capsule by mouth daily. Vit D3 (4,000U), Vit A, K2+ 4 immune boosters- Take 1 capsule by mouth daily       clopidogrel (PLAVIX) 75 MG tablet Take 75 mg by mouth daily.       ezetimibe (ZETIA) 10 MG tablet Take 1 tablet (10 mg total) by mouth daily. Please make yearly appt with [name] for May 2023 for future refills. Thank you 1st attempt 90 tablet 0   Ginkgo Biloba 120 MG CAPS Take 120 mg by mouth in the morning.       losartan (COZAAR) 50 MG tablet Take 1 tablet (50 mg total) by mouth daily.        Melatonin 10 MG TABS Take 10 mg by mouth at bedtime. NEEDS NIGHTLY TO SLEEP OR WILL BE RESTLESS       nitroGLYCERIN (NITROSTAT) 0.4 MG SL tablet Place 1 tablet (0.4 mg total) under the tongue every 5 (five) minutes as needed for chest pain. 25 tablet 2   Omega-3-acid Eth &Multivit/Min (OMEGA-3 RX COMPLETE PO) Take 1 capsule by mouth at bedtime.       pravastatin (PRAVACHOL) 40 MG tablet Take 1 tablet (40 mg total) by mouth daily at 6 PM. 30 tablet 1          Home: Home Living Family/patient expects to be discharged to:: Private residence Living Arrangements: Alone, Other (Comment) (family 24/7) Available Help at Discharge: Family, Available 24 hours/day Type of Home: House Home Access: Stairs to enter [employer] of Steps: 1 Entrance Stairs-Rails: None Home Layout: One level Bathroom Shower/Tub: Sponge bathes at baseline Bathroom Toilet: Standard Bathroom Accessibility: Yes Home Equipment: Conservation officer, nature (2 wheels), [employer] - single point, Civil engineer, contracting Additional Comments: [name] family want AIR  Lives With: Other (Comment) (daughter one week at a time)   Functional History: Prior Function Prior Level of Function : Needs assist Mobility Comments: without A or hand held assist ADLs Comments: pt requires assistance with bathing and IADLs   Functional Status:  Mobility: Bed Mobility Overal bed mobility: Needs Assistance Bed Mobility: Supine to Sit, Sit to Supine Supine to sit: Max assist, HOB elevated Sit to supine: HOB elevated, Mod assist General bed mobility comments: L lateral lean  inititally and then able to find midline and stay there Transfers Overall transfer level: Needs assistance Equipment used:  (gait belt) Transfers: Sit to/from Stand Sit to Stand: From elevated surface, Mod assist General transfer comment: achieved nearly full standing x 3 reps (hips flexed with legs pushing against bed and shoulders slightly forward) Ambulation/Gait General Gait  Details: unable to fully stand for pre-gait Pre-gait activities: attempted side steps to left toward head of stretcher, maxA for one step with PT sliding pt's left foot and totalA to weight shift   ADL: ADL Overall ADL's : Needs assistance/impaired Eating/Feeding: Total assistance Grooming: Moderate assistance, Wash/dry hands, Wash/dry face, Oral care, Brushing hair Grooming Details (indicate cue type and reason): set-upA to brush hair, otherwise requiring modA Upper Body Bathing: Moderate assistance, Sitting, Bed level Lower Body Bathing: Total assistance, Bed level, Sit to/from stand Upper Body Dressing : Moderate assistance, Sitting, Bed level Lower Body Dressing: Total assistance, Sitting/lateral leans, Bed level, Cueing for safety, Cueing for sequencing Toilet Transfer: Total assistance, +2 for physical assistance, +2 for safety/equipment  Toilet Transfer Details (indicate cue type and reason): standing with maxA +1 but not fully able to extend and relying on OT to keep pt up, barely clearing mattress Toileting- Clothing Manipulation and Hygiene: Total assistance Functional mobility during ADLs: Maximal assistance, +2 for physical assistance, +2 for safety/equipment, Cueing for safety, Cueing for sequencing General ADL Comments: Pt modA to [place] for ADL tasks. Pt can comb hair with set-upA, but unable to state "comb." Pt sitting EOB with fair balance and minguard as needed. Pt currently, standing briefly, but not able ot fully extend and clear bottom from bed.   Cognition: Cognition Overall Cognitive Status: History of cognitive impairments - at baseline Arousal/Alertness: Awake/alert Orientation Level: Oriented to person Attention: Sustained Sustained Attention: Impaired Sustained Attention Impairment: Verbal basic, Functional basic Memory: Impaired Memory Impairment: Storage deficit Awareness: Impaired Awareness Impairment: Intellectual impairment Problem Solving:  Impaired Safety/Judgment: Impaired Cognition Arousal/Alertness: Awake/alert Behavior During Therapy: Flat affect Overall Cognitive Status: History of cognitive impairments - at baseline General Comments: Pt with advanced dementia at baseline. Pt follows 1 step commands with increased time. L inattention present. Impaired awareness of deficits and safety.   Physical Exam: Blood pressure (!) 147/73, pulse 75, temperature 98 F (36.7 C), temperature source Oral, resp. rate 17, height 5' 2.5" (1.588 m), weight 71.8 kg, SpO2 94 %. Physical Exam HENT:     Right Ear: External ear normal.     Left Ear: External ear normal.     Mouth/Throat:     Pharynx: Oropharynx is clear.  Eyes:     General: No scleral icterus.    Conjunctiva/sclera: Conjunctivae normal.  Cardiovascular:     Rate and Rhythm: Normal rate and regular rhythm.     Heart sounds: No murmur heard.    No gallop.  Pulmonary:     Effort: Pulmonary effort is normal.     Breath sounds: Normal breath sounds.  Abdominal:     General: Bowel sounds are normal. There is no distension.     Palpations: Abdomen is soft.  Musculoskeletal:        General: Normal range of motion.     Cervical back: Normal range of motion.     Right lower leg: No edema.     Left lower leg: No edema.  Skin:    General: Skin is warm.     Comments: Left nipple lesion  Neurological:     Mental Status: She is alert.     Comments: Pt is alert. Oriented to self only. Speech fairly clear. Unable to name simple objects, sometimes with paraphasias. Significant memory deficits. Did follow simple one step commands intermittently. Minimal insight and awareness. Does move all 4 limbs right more spontaneously than left and senses pain in all 4's. Right gaze preference, left HH.    Psychiatric:     Comments: Pleasant, smiling        Lab Results Last 48 Hours  No results found for this or any previous visit (from the past 48 hour(s)).    Imaging Results (Last 48  hours)  ECHOCARDIOGRAM COMPLETE BUBBLE STUDY   Result Date: 05/27/2022    ECHOCARDIOGRAM REPORT   Patient Name:   MEKISHA BITTEL Date of Exam: 05/27/2022 Medical Rec #:  [id number]   Height:       62.5 in Accession #:    [id number]  Weight:       158.3 lb Date of Birth:  [birth year]  BSA:          1.741  m Patient Age:    41 years    BP:           132/79 mmHg Patient Gender: F           HR:           77 bpm. Exam Location:  Inpatient Procedure: 2D Echo, Cardiac Doppler, Color Doppler and Saline Contrast Bubble            Study Indications:    Stroke 434.91 / I63.9  History:        Patient has prior history of Echocardiogram examinations, most                 recent 09/26/2021. Previous Myocardial Infarction, Carotid                 Disease; Risk Factors:Hypertension and Dyslipidemia.  Sonographer:    Darlina Sicilian RDCS Referring Phys: [id number] [place]  1. Left ventricular ejection fraction by 3D volume is 54 %. The left ventricle has low normal function. The left ventricle has no regional wall motion abnormalities. Left ventricular diastolic parameters are consistent with Grade I diastolic dysfunction  (impaired relaxation). Elevated left atrial pressure.  2. Right ventricular systolic function is normal. The right ventricular size is normal. There is normal pulmonary artery systolic pressure.  3. The mitral valve is degenerative. No evidence of mitral valve regurgitation. Mild mitral stenosis. The mean mitral valve gradient is 4.0 mmHg.  4. DI 0.46. The aortic valve is tricuspid. There is mild calcification of the aortic valve. There is mild thickening of the aortic valve. Aortic valve regurgitation is trivial. Mild to moderate aortic valve stenosis. Aortic valve area, by VTI measures 1.17 cm. Aortic valve mean gradient measures 12.0 mmHg. Aortic valve Vmax measures 2.21 m/s.  5. The inferior vena cava is normal in size with greater than 50% respiratory variability, suggesting right atrial  pressure of 3 mmHg. Conclusion(s)/Recommendation(s): No intracardiac source of embolism detected on this transthoracic study. Consider a transesophageal echocardiogram to exclude cardiac source of embolism if clinically indicated. FINDINGS  Left Ventricle: Left ventricular ejection fraction by 3D volume is 54 %. The left ventricle has low normal function. The left ventricle has no regional wall motion abnormalities. The left ventricular internal cavity size was normal in size. There is no left ventricular hypertrophy. Left ventricular diastolic parameters are consistent with Grade I diastolic dysfunction (impaired relaxation). Elevated left atrial pressure. Right Ventricle: The right ventricular size is normal. No increase in right ventricular wall thickness. Right ventricular systolic function is normal. There is normal pulmonary artery systolic pressure. The tricuspid regurgitant velocity is 2.05 m/s, and  with an assumed right atrial pressure of 3 mmHg, the estimated right ventricular systolic pressure is 93.2 mmHg. Left Atrium: Left atrial size was normal in size. Right Atrium: Right atrial size was normal in size. Pericardium: There is no evidence of pericardial effusion. Mitral Valve: The mitral valve is degenerative in appearance. There is mild thickening of the mitral valve leaflet(s). There is mild calcification of the mitral valve leaflet(s). No evidence of mitral valve regurgitation. Mild mitral valve stenosis. MV peak gradient, 9.9 mmHg. The mean mitral valve gradient is 4.0 mmHg. Tricuspid Valve: The tricuspid valve is normal in structure. Tricuspid valve regurgitation is mild . No evidence of tricuspid stenosis. Aortic Valve: DI 0.46. The aortic valve is tricuspid. There is mild calcification of the aortic valve. There is mild thickening of the aortic valve. Aortic valve regurgitation is trivial.  Aortic regurgitation PHT measures 439 msec. Mild to moderate aortic stenosis is present. Aortic valve mean  gradient measures 12.0 mmHg. Aortic valve peak gradient measures 19.5 mmHg. Aortic valve area, by VTI measures 1.17 cm. Pulmonic Valve: The pulmonic valve was normal in structure. Pulmonic valve regurgitation is not visualized. No evidence of pulmonic stenosis. Aorta: The aortic root is normal in size and structure. Venous: The inferior vena cava is normal in size with greater than 50% respiratory variability, suggesting right atrial pressure of 3 mmHg. IAS/Shunts: No atrial level shunt detected by color flow Doppler. Agitated saline contrast was given intravenously to evaluate for intracardiac shunting.  LEFT VENTRICLE PLAX 2D LVIDd:         4.20 cm         Diastology LVIDs:         3.00 cm         LV e' medial:    2.34 cm/s LV PW:         0.70 cm         LV E/e' medial:  30.3 LV IVS:        0.80 cm         LV e' lateral:   3.57 cm/s LVOT diam:     1.90 cm         LV E/e' lateral: 19.9 LV SV:         52 LV SV Index:   30 LVOT Area:     2.84 cm        3D Volume EF                                LV 3D EF:    Left                                             ventricul                                             ar                                             ejection                                             fraction                                             by 3D                                             volume is  54 %.                                 3D Volume EF:                                3D EF:        54 %                                LV EDV:       97 ml                                LV ESV:       44 ml                                LV SV:        53 ml RIGHT VENTRICLE RV S prime:     19.40 cm/s TAPSE (M-mode): 2.0 cm LEFT ATRIUM             Index        RIGHT ATRIUM          Index LA diam:        3.20 cm 1.84 cm/m   RA Area:     8.17 cm LA Vol (A2C):   31.6 ml 18.15 ml/m  RA Volume:   14.50 ml 8.33 ml/m LA Vol (A4C):   33.0 ml 18.95 ml/m LA Biplane  Vol: 33.4 ml 19.18 ml/m  AORTIC VALVE AV Area (Vmax):    1.33 cm AV Area (Vmean):   1.12 cm AV Area (VTI):     1.17 cm AV Vmax:           221.00 cm/s AV Vmean:          162.667 cm/s AV VTI:            0.441 m AV Peak Grad:      19.5 mmHg AV Mean Grad:      12.0 mmHg LVOT Vmax:         104.00 cm/s LVOT Vmean:        64.200 cm/s LVOT VTI:          0.182 m LVOT/AV VTI ratio: 0.41 AI PHT:            439 msec  AORTA Ao Root diam: 3.10 cm Ao Asc diam:  3.30 cm MITRAL VALVE                TRICUSPID VALVE MV Area (PHT): 2.01 cm     TR Peak grad:   16.8 mmHg MV Area VTI:   1.49 cm     TR Vmax:        205.00 cm/s MV Peak grad:  9.9 mmHg MV Mean grad:  4.0 mmHg     SHUNTS MV Vmax:       1.57 m/s     Systemic VTI:  0.18 m MV Vmean:      87.0 cm/s    Systemic Diam: 1.90 cm MV Decel Time: 377 msec MV E velocity: 71.00 cm/s MV A velocity: 129.00 cm/s MV E/A ratio:  0.55 Katelyn Furbish MD Electronically  signed by Katelyn Furbish MD Signature Date/Time: 05/27/2022/5:21:57 PM    Final           Blood pressure (!) 147/73, pulse 75, temperature 98 F (36.7 C), temperature source Oral, resp. rate 17, height 5' 2.5" (1.588 m), weight 71.8 kg, SpO2 94 %.   Medical Problem List and Plan: 1. Functional deficits secondary to right thalamic infarct; Alzheimner's dementia and prior stroke/TIA with left arm weakness and left hemianopsia             -patient may shower             -ELOS/Goals: 14-21 days, supervision to min assist goals 2.  Antithrombotics: -DVT/anticoagulation:  Pharmaceutical: Lovenox             -antiplatelet therapy: Plavix and aspirin 81 mg (family declines Brilinta) 3. Pain Management: Tylenol as needed 4. Mood/Behavior/Sleep: LCSW to evaluate and provide emotional support             -antipsychotic agents: n/a 5. Neuropsych/cognition: This patient is not capable of making decisions on her own behalf. 6. Skin/Wound Care: Routine skin care checks 7. Fluids/Electrolytes/Nutrition: Routine I's and O's and  follow-up chemistries             -heart healthy diet/thin liq; crush meds in pureed 8: Hypertension: continue amlodipine 5 mg daily             -Cozaar 50 mg daily is being held 9: Hyperlipidemia: continue Zetia, Pravachol 10: CAD: stable, no CP 11: Alzheimer's dementia; follows with Dr. Felecia Shelling. Does not tolerate donepezil 12: Diastolic CHF: stable, no SOB or edema 13: Insomnia: continue melatonin>>need to give by 7-7:30 pm nightly 14: Left nipple skin change: no sign of infection; follow-up as outpatient 15: Urinary incontinence:              -attempt timed voids             -check PVR's       Barbie Banner, PA-C 05/28/2022   I have personally performed a face to face diagnostic evaluation of this patient and formulated the key components of the plan.  Additionally, I have personally reviewed laboratory data, imaging studies, as well as relevant notes and concur with the physician assistant's documentation above.  The patient's status has not changed from the original H&P.  Any changes in documentation from the acute care chart have been noted above.  Meredith Staggers, MD, [facility]

## 2022-05-28 NOTE — Discharge Summary (Signed)
Physician Discharge Summary   Patient: Katelyn Powers MRN: [id number] DOB: 1932-03-21  Admit date:     05/25/2022  Discharge date: 05/28/22  Discharge Physician: Patrecia Pour   PCP: Cyndi Bender, PA-C   Recommendations at discharge:  Follow up with PM&R MD at [facility].  Continue DAPT and statin for recurrent stroke, recommend outpatient neurology follow up with Dr. Felecia Shelling in 6-8 weeks.   Discharge Diagnoses: Principal Problem:   Acute stroke due to ischemia [facility]) Active Problems:   Carotid stenosis, right   Elevated troponin level not due myocardial infarction   Chronic diastolic CHF (congestive heart failure) ([place])   Coronary artery disease involving native coronary artery of native heart without angina pectoris   Mixed hyperlipidemia   Essential hypertension   Late onset Alzheimer's disease without behavioral disturbance [facility])  Hospital Course: Katelyn Powers is a 88 y.o. female with PMH significant for Alzheimer's dementia, HTN, HLD, CAD, diastolic CHF, mild aortic stenosis, carotid artery stenosis, prior stroke with multiple TIAs Patient presented to the ED on 7/29 with sudden onset of weakness. Per daughter, around 10 AM, patient was sitting on the couch and she "seemed to be leaning towards her left.  Slightly later in the day, at approximately 12 PM patient was noted to have some left-sided weakness when the daughter tried to get her up off the couch.  She also noted that her mother was having difficulty seeing things towards her left.  EMS was called and patient was brought to the ED at around 1:30 PM   In the ED, patient was afebrile, blood pressure was elevated 147/81, breathing on room air Troponin slightly elevated 40, otherwise labs unremarkable. CT scan of head did not show any acute intracranial abnormality but showed remote inferior right cerebellar infarct and a remote subcortical infarct in the anterior right frontal lobe MRI brain showed an acute/subacute nonhemorrhagic  infarct involving the lateral right thalamus and chronic microvascular ischemia MRA head showed mild narrowing of the right M1 and A1 segments Seen by neurology Admitted to [facility] course is detailed below.   Assessment and Plan: Acute ischemic stroke of the right lateral thalamus Brought to the ED for left-sided weakness.  On exam, also found to have a left hemianopia and left arm weakness. Symptoms improving since admission, still requires CIR level rehabilitation and ultimate discharge home with family support and DME.  - PTA on aspirin 81 mg daily, Plavix 75 mg daily, statin 40 mg daily. Neurology recommends continuing the same  - Dietary/lifestyle approaches to reducing stroke risk discussed.   Carotid stenosis, right: Patient's family has declined intervention. Known history of right-sided carotid stenosis based on prior imaging, 09/2021 right ICA 65-70% stenosis on CTA Positive for functionally occluded non-dominant and diminutive Right Vertebral Artery V4 segment. Dominant Left Vertebral Artery supplies the basilar without significant stenosis. But severe stenosis at the Left PCA origin. And attenuated bilateral PCA branches which appears more likely due to stenosis than artifact. These findings are concordant with the MRI yesterday.   2. CTA neck also demonstrates High-grade stenosis Right ICA origin and bulb approaching a RADIOGRAPHIC STRING SIGN. But the anterior circulation is stable from the MRA yesterday, with mild stenosis at both the right MCA and ACA origins.   3. Expected CT appearance of the right thalamic infarct. No associated hemorrhage or mass effect. No new intracranial abnormality.   4. Aortic Atherosclerosis (ICD10-I70.0).   Elevated troponin due to demand myocardial ischemia in patient with  CAD: ACS ruled out. hs Tn 40 but patient with chest pain-free without acutely ischemic ECG features.  - Continue antiplatelet and statin   HLD:  -  Continue statin, zetia, and holistic remedies per family preference.  Chronic diastolic CHF (congestive heart failure)  Essential hypertension Euvolemic at this time.  Last echo from 08/2021 with EF 55 to 60% and G1 DD. LVEF on echo for stroke work up noted to be 54% with G1DD, no regional wall motion abnormalities. RV normal, mild MS.  PTA on amlodipine 5 mg daily, losartan 50 mg daily Permissive hypertension for now.   Resume gradually   Late onset Alzheimer's disease without behavioral disturbance  Longstanding known history of dementia and cognitive deficit Minimizing uncomfortable stimuli Minimizing mood altering and sedating agents Encouraging family to remain at bedside is much as possible Frequent redirection by staff Fall precautions  Aortic stenosis: Mild-moderate by echo.  - Cardiology follow up recommended nonurgently.   Consultants: Neurology Procedures performed: None  Disposition:  Inpatient rehabilitation Diet recommendation:  Cardiac and Carb modified diet DISCHARGE MEDICATION: Allergies as of 05/28/2022   No Known Allergies      Medication List     TAKE these medications    amLODipine 5 MG tablet Commonly known as: NORVASC Take 1 tablet (5 mg total) by mouth daily.   aspirin EC 81 MG tablet Take 1 tablet (81 mg total) by mouth daily. Swallow whole.   clopidogrel 75 MG tablet Commonly known as: PLAVIX Take 75 mg by mouth daily.   ezetimibe 10 MG tablet Commonly known as: ZETIA Take 1 tablet (10 mg total) by mouth daily. Please make yearly appt with [name] for May 2023 for future refills. Thank you 1st attempt   Ginkgo Biloba 120 MG Caps Take 120 mg by mouth in the morning.   losartan 50 MG tablet Commonly known as: COZAAR Take 1 tablet (50 mg total) by mouth daily.   Melatonin 10 MG Tabs Take 10 mg by mouth at bedtime. NEEDS NIGHTLY TO SLEEP OR WILL BE RESTLESS   nitroGLYCERIN 0.4 MG SL tablet Commonly known as: NITROSTAT Place 1 tablet  (0.4 mg total) under the tongue every 5 (five) minutes as needed for chest pain.   OMEGA-3 RX COMPLETE PO Take 1 capsule by mouth at bedtime.   pravastatin 40 MG tablet Commonly known as: PRAVACHOL Take 1 tablet (40 mg total) by mouth daily at 6 PM.   VITAMIN D3 PO Take 1 capsule by mouth daily. Vit D3 (4,000U), Vit A, K2+ 4 immune boosters- Take 1 capsule by mouth daily        Follow-up Information     Cyndi Bender, PA-C Follow up.   Specialty: Physician Assistant Contact information: [place] [address] [phone]         Belva Crome, MD .   Specialty: Cardiology Contact information: [phone] [address] [place] [address] [phone]         Britt Bottom, MD Follow up.   Specialty: Neurology Contact information: [address] [place] [address] [phone]                Discharge Exam: Danley Danker Weights   05/26/22 2044  Weight: 71.8 kg  BP (!) 147/73 (BP Location: Right Arm)   Pulse 75   Temp 98 F (36.7 C) (Oral)   Resp 17   Ht 5' 2.5" (1.588 m)   Wt 71.8 kg   SpO2 94%  BMI 28.49 kg/m   Elderly female in no distress, having eaten breakfast. Family at bedside RRR, no pitting edema Clear, nonlabored Confused  Condition at discharge: stable  The results of significant diagnostics from this hospitalization (including imaging, microbiology, ancillary and laboratory) are listed below for reference.   Imaging Studies: ECHOCARDIOGRAM COMPLETE BUBBLE STUDY  Result Date: 05/27/2022    ECHOCARDIOGRAM REPORT   Patient Name:   Katelyn Powers Date of Exam: 05/27/2022 Medical Rec #:  [id number]   Height:       62.5 in Accession #:    [id number]  Weight:       158.3 lb Date of Birth:  [birth year]  BSA:          1.741 m Patient Age:    10 years    BP:           132/79 mmHg Patient Gender: F           HR:           77 bpm. Exam Location:  Inpatient Procedure: 2D Echo, Cardiac Doppler, Color Doppler and  Saline Contrast Bubble            Study Indications:    Stroke 434.91 / I63.9  History:        Patient has prior history of Echocardiogram examinations, most                 recent 09/26/2021. Previous Myocardial Infarction, Carotid                 Disease; Risk Factors:Hypertension and Dyslipidemia.  Sonographer:    Darlina Sicilian RDCS Referring Phys: [id number] [place]  1. Left ventricular ejection fraction by 3D volume is 54 %. The left ventricle has low normal function. The left ventricle has no regional wall motion abnormalities. Left ventricular diastolic parameters are consistent with Grade I diastolic dysfunction  (impaired relaxation). Elevated left atrial pressure.  2. Right ventricular systolic function is normal. The right ventricular size is normal. There is normal pulmonary artery systolic pressure.  3. The mitral valve is degenerative. No evidence of mitral valve regurgitation. Mild mitral stenosis. The mean mitral valve gradient is 4.0 mmHg.  4. DI 0.46. The aortic valve is tricuspid. There is mild calcification of the aortic valve. There is mild thickening of the aortic valve. Aortic valve regurgitation is trivial. Mild to moderate aortic valve stenosis. Aortic valve area, by VTI measures 1.17 cm. Aortic valve mean gradient measures 12.0 mmHg. Aortic valve Vmax measures 2.21 m/s.  5. The inferior vena cava is normal in size with greater than 50% respiratory variability, suggesting right atrial pressure of 3 mmHg. Conclusion(s)/Recommendation(s): No intracardiac source of embolism detected on this transthoracic study. Consider a transesophageal echocardiogram to exclude cardiac source of embolism if clinically indicated. FINDINGS  Left Ventricle: Left ventricular ejection fraction by 3D volume is 54 %. The left ventricle has low normal function. The left ventricle has no regional wall motion abnormalities. The left ventricular internal cavity size was normal in size. There is no  left ventricular hypertrophy. Left ventricular diastolic parameters are consistent with Grade I diastolic dysfunction (impaired relaxation). Elevated left atrial pressure. Right Ventricle: The right ventricular size is normal. No increase in right ventricular wall thickness. Right ventricular systolic function is normal. There is normal pulmonary artery systolic pressure. The tricuspid regurgitant velocity is 2.05 m/s, and  with an assumed right atrial pressure of 3 mmHg, the estimated right ventricular systolic pressure is  19.8 mmHg. Left Atrium: Left atrial size was normal in size. Right Atrium: Right atrial size was normal in size. Pericardium: There is no evidence of pericardial effusion. Mitral Valve: The mitral valve is degenerative in appearance. There is mild thickening of the mitral valve leaflet(s). There is mild calcification of the mitral valve leaflet(s). No evidence of mitral valve regurgitation. Mild mitral valve stenosis. MV peak gradient, 9.9 mmHg. The mean mitral valve gradient is 4.0 mmHg. Tricuspid Valve: The tricuspid valve is normal in structure. Tricuspid valve regurgitation is mild . No evidence of tricuspid stenosis. Aortic Valve: DI 0.46. The aortic valve is tricuspid. There is mild calcification of the aortic valve. There is mild thickening of the aortic valve. Aortic valve regurgitation is trivial. Aortic regurgitation PHT measures 439 msec. Mild to moderate aortic stenosis is present. Aortic valve mean gradient measures 12.0 mmHg. Aortic valve peak gradient measures 19.5 mmHg. Aortic valve area, by VTI measures 1.17 cm. Pulmonic Valve: The pulmonic valve was normal in structure. Pulmonic valve regurgitation is not visualized. No evidence of pulmonic stenosis. Aorta: The aortic root is normal in size and structure. Venous: The inferior vena cava is normal in size with greater than 50% respiratory variability, suggesting right atrial pressure of 3 mmHg. IAS/Shunts: No atrial level shunt  detected by color flow Doppler. Agitated saline contrast was given intravenously to evaluate for intracardiac shunting.  LEFT VENTRICLE PLAX 2D LVIDd:         4.20 cm         Diastology LVIDs:         3.00 cm         LV e' medial:    2.34 cm/s LV PW:         0.70 cm         LV E/e' medial:  30.3 LV IVS:        0.80 cm         LV e' lateral:   3.57 cm/s LVOT diam:     1.90 cm         LV E/e' lateral: 19.9 LV SV:         52 LV SV Index:   30 LVOT Area:     2.84 cm        3D Volume EF                                LV 3D EF:    Left                                             ventricul                                             ar                                             ejection  fraction                                             by 3D                                             volume is                                             54 %.                                 3D Volume EF:                                3D EF:        54 %                                LV EDV:       97 ml                                LV ESV:       44 ml                                LV SV:        53 ml RIGHT VENTRICLE RV S prime:     19.40 cm/s TAPSE (M-mode): 2.0 cm LEFT ATRIUM             Index        RIGHT ATRIUM          Index LA diam:        3.20 cm 1.84 cm/m   RA Area:     8.17 cm LA Vol (A2C):   31.6 ml 18.15 ml/m  RA Volume:   14.50 ml 8.33 ml/m LA Vol (A4C):   33.0 ml 18.95 ml/m LA Biplane Vol: 33.4 ml 19.18 ml/m  AORTIC VALVE AV Area (Vmax):    1.33 cm AV Area (Vmean):   1.12 cm AV Area (VTI):     1.17 cm AV Vmax:           221.00 cm/s AV Vmean:          162.667 cm/s AV VTI:            0.441 m AV Peak Grad:      19.5 mmHg AV Mean Grad:      12.0 mmHg LVOT Vmax:         104.00 cm/s LVOT Vmean:        64.200 cm/s LVOT VTI:          0.182 m LVOT/AV VTI ratio: 0.41 AI PHT:            439 msec  AORTA Ao Root diam: 3.10 cm Ao Asc diam:  3.30 cm MITRAL VALVE                 TRICUSPID VALVE MV Area (PHT): 2.01 cm     TR Peak grad:   16.8 mmHg MV Area VTI:   1.49 cm     TR Vmax:        205.00 cm/s MV Peak grad:  9.9 mmHg MV Mean grad:  4.0 mmHg     SHUNTS MV Vmax:       1.57 m/s     Systemic VTI:  0.18 m MV Vmean:      87.0 cm/s    Systemic Diam: 1.90 cm MV Decel Time: 377 msec MV E velocity: 71.00 cm/s MV A velocity: 129.00 cm/s MV E/A ratio:  0.55 Candee Furbish MD Electronically signed by Candee Furbish MD Signature Date/Time: 05/27/2022/5:21:57 PM    Final    CT ANGIO HEAD NECK W WO CM  Result Date: 05/26/2022 CLINICAL DATA:  88 year old female with right thalamic infarct on brain MRI yesterday. EXAM: CT ANGIOGRAPHY HEAD AND NECK TECHNIQUE: Multidetector CT imaging of the head and neck was performed using the standard protocol during bolus administration of intravenous contrast. Multiplanar CT image reconstructions and MIPs were obtained to evaluate the vascular anatomy. Carotid stenosis measurements (when applicable) are obtained utilizing NASCET criteria, using the distal internal carotid diameter as the denominator. RADIATION DOSE REDUCTION: This exam was performed according to the departmental dose-optimization program which includes automated exposure control, adjustment of the mA and/or kV according to patient size and/or use of iterative reconstruction technique. CONTRAST:  13m OMNIPAQUE IOHEXOL 350 MG/ML SOLN COMPARISON:  Head CT, brain MRI, intracranial MRA yesterday. FINDINGS: CT HEAD Brain: Progressed hypodensity in the right thalamus corresponding to the DWI abnormality (series 4, image 18). No associated hemorrhage. No significant mass effect. Stable non contrast CT appearance of the brain elsewhere; chronic right cerebellar and left MCA encephalomalacia. Calvarium and skull base: No acute osseous abnormality identified. Paranasal sinuses: Visualized paranasal sinuses and mastoids are stable and well aerated. Orbits: No acute orbit or scalp soft tissue finding. CTA NECK  Skeleton: Cervical spine degeneration appears mild for age. No acute osseous abnormality identified. Upper chest: Negative. Other neck: Negative. Aortic arch: Calcified aortic atherosclerosis. Three vessel arch configuration. Right carotid system: Brachiocephalic artery plaque without stenosis. Negative right CCA origin. Mildly tortuous right CCA. But bulky plaque at the right ICA origin and bulb resulting in high-grade stenosis approaching a radiographic string sign (series 7, image 97 and series 12, image 104) over a segment of up to 8 mm. Right ICA remains patent to the skull base. Left carotid system: Left CCA origin plaque without stenosis. Mild calcified plaque in the left ICA bulb, and high-grade tortuosity beginning at the distal bulb, but no significant stenosis to the skull base. Vertebral arteries: Bulky calcified plaque in the proximal right subclavian artery and tortuosity with a kinked appearance in the superior mediastinum (series 12, image 103). Non dominant right vertebral artery origin is patent with calcified plaque but only mild stenosis. Right vertebral remains diminutive but patent to the skull base. Proximal left subclavian artery soft and calcified plaque with up to 50 % stenosis with respect to the distal vessel. Dominant left vertebral artery origin is within normal limits. Tortuous left V1 segment. Left vertebral is tortuous and patent to the skull base without significant stenosis. CTA HEAD Posterior circulation: Diminutive right vertebral artery appears functionally occluded in the V4 segment, concordant with the MRA appearance yesterday. Dominant  left V4 segment is patent with calcified plaque but no significant stenosis to the vertebrobasilar junction. Patent left PICA origin. Patent basilar artery without stenosis. Patent SCA and PCA origins. However, severe stenosis at the left PCA origin on series 15, image 19, also similar to the MRA appearance. Posterior communicating arteries are  diminutive or absent. Attenuated bilateral distal PCA branch enhancement, which seems less likely related to contrast timing given the anterior circulation appearance. Anterior circulation: Both ICA siphons are patent with only mild calcified plaque and no stenosis. Patent carotid termini, MCA and ACA origins. Mild stenosis at both the right MCA and ACA origin on series 15, image 16. Anterior communicating artery and bilateral ACA branches are within normal limits. Left MCA M1 bifurcates almost immediately without stenosis. Left MCA branches are within normal limits. Right MCA M1 segment is patent. Right MCA bifurcation is patent without stenosis. Right MCA branches are within normal limits. Venous sinuses: Early contrast timing, not evaluated. Anatomic variants: Dominant left vertebral artery. Review of the MIP images confirms the above findings IMPRESSION: 1. Positive for functionally occluded non-dominant and diminutive Right Vertebral Artery V4 segment. Dominant Left Vertebral Artery supplies the basilar without significant stenosis. But severe stenosis at the Left PCA origin. And attenuated bilateral PCA branches which appears more likely due to stenosis than artifact. These findings are concordant with the MRI yesterday. 2. CTA neck also demonstrates High-grade stenosis Right ICA origin and bulb approaching a RADIOGRAPHIC STRING SIGN. But the anterior circulation is stable from the MRA yesterday, with mild stenosis at both the right MCA and ACA origins. 3. Expected CT appearance of the right thalamic infarct. No associated hemorrhage or mass effect. No new intracranial abnormality. 4. Aortic Atherosclerosis (ICD10-I70.0). Electronically Signed   By: Genevie Ann M.D.   On: 05/26/2022 10:21   DG Chest 2 View  Result Date: 05/25/2022 CLINICAL DATA:  Weakness. EXAM: CHEST - 2 VIEW COMPARISON:  Chest x-ray 09/04/2020 FINDINGS: Cardiomediastinal silhouette is within normal limits. Lung volumes are low likely  accentuating central pulmonary vascularity. There is no focal lung infiltrate, pleural effusion or pneumothorax. Surgical clips overlie the right chest, unchanged. No acute fractures are seen. IMPRESSION: No active cardiopulmonary disease. Electronically Signed   By: Ronney Asters M.D.   On: 05/25/2022 19:14   MR ANGIO HEAD WO CONTRAST  Result Date: 05/25/2022 CLINICAL DATA:  Increased difficulty with ambulating. Right thalamic infarct. EXAM: MRA HEAD WITHOUT CONTRAST TECHNIQUE: Angiographic images of the Circle of Willis were acquired using MRA technique without intravenous contrast. COMPARISON:  MR head without contrast 05/25/2022 FINDINGS: Anterior circulation: The internal carotid arteries are within normal limits the high cervical segments through the ICA termini. Mild narrowing of the proximal right M1 and A1 segments stable. Signal loss in the proximal M2 segments bilaterally is likely artifactual. Attenuation of MCA branch vessels is likely artifactual due to patient motion. Posterior circulation: The right vertebral artery is noted to be hypoplastic and terminates at PICA. Small V4 segment noted. Left PICA origin visualized and within normal limits. Basilar artery is normal. Both posterior cerebral arteries originate basilar tip. Moderate left P1 segment stenosis present. PCA branch vessels are not well visualized due to artifact. Anatomic variants: None Other: None. IMPRESSION: 1. Study is moderately degraded by patient motion as described above. 2. Mild narrowing of the right M1 and A1 segments. 3. Signal loss in the proximal M2 segments and PCA branch vessels bilaterally is likely artifactual due to patient motion. 4. Moderate left P1 segment  stenosis. Electronically Signed   By: San Morelle M.D.   On: 05/25/2022 18:33   MR BRAIN WO CONTRAST  Result Date: 05/25/2022 CLINICAL DATA:  Increased difficulty ambulating. Baseline dementia. EXAM: MRI HEAD WITHOUT CONTRAST TECHNIQUE: Multiplanar,  multiecho pulse sequences of the brain and surrounding structures were obtained without intravenous contrast. COMPARISON:  CT head without contrast 04/25/2022 FINDINGS: Brain: Acute/subacute nonhemorrhagic infarct is present in the lateral right thalamus measuring 21 x 6 mm on axial images. T2 and FLAIR signal hyperintensity is associated with the area of acute infarction. Remote encephalomalacia of the inferior right cerebellum is again noted. Advanced atrophy and diffuse white matter disease is stable. Remote lacunar infarct is present adjacent to the frontal horn the right lateral ventricle. The ventricles are proportionate to the degree of atrophy. No significant extraaxial fluid collection is present. Vascular: Close present in the major intracranial arteries. T2 weighted images are degraded by patient motion. Skull and upper cervical spine: The craniocervical junction is normal. Upper cervical spine is within normal limits. Marrow signal is unremarkable. Sinuses/Orbits: The paranasal sinuses and mastoid air cells are clear. The globes and orbits are within normal limits. IMPRESSION: 1. Acute/subacute nonhemorrhagic infarct involving the lateral right thalamus. T2 signal changes are present within the infarct. 2. Advanced atrophy and diffuse white matter disease likely reflects the sequela of chronic microvascular ischemia. 3. Remote encephalomalacia of the inferior right cerebellum. Electronically Signed   By: San Morelle M.D.   On: 05/25/2022 18:31   CT HEAD CODE STROKE WO CONTRAST  Result Date: 05/25/2022 CLINICAL DATA:  Code stroke. Left-sided weakness. Last known well at 12:30 p.m. EXAM: CT HEAD WITHOUT CONTRAST TECHNIQUE: Contiguous axial images were obtained from the base of the skull through the vertex without intravenous contrast. RADIATION DOSE REDUCTION: This exam was performed according to the departmental dose-optimization program which includes automated exposure control, adjustment  of the mA and/or kV according to patient size and/or use of iterative reconstruction technique. COMPARISON:  MR head without contrast 10/24/2021. FINDINGS: Brain: Remote inferior right cerebellar infarct again noted. Moderate atrophy and white matter disease is stable. No acute infarct, hemorrhage, or mass lesion is present. The ventricles are proportionate to the degree of atrophy. Remote subcortical infarct in the anterior right frontal lobe is stable. No significant extraaxial fluid collection is present. Brainstem and cerebellum are otherwise within normal limits. Vascular: Minimal atherosclerotic calcifications are again seen within the cavernous internal carotid artery in the dural margin of the left vertebral artery. No hyperdense vessel is present. Skull: Calvarium is intact. No focal lytic or blastic lesions are present. No significant extracranial soft tissue lesion is present. Sinuses/Orbits: The paranasal sinuses and mastoid air cells are clear. The globes and orbits are within normal limits. Other: ASPECTS (Adel Stroke Program Early CT Score) - Ganglionic level infarction (caudate, lentiform nuclei, internal capsule, insula, M1-M3 cortex): 7/7 - Supraganglionic infarction (M4-M6 cortex): 3/3 Total score (0-10 with 10 being normal): 10/10 IMPRESSION: 1. No acute intracranial abnormality or significant interval change. 2. Stable atrophy and white matter disease. 3. Remote inferior right cerebellar infarct. 4. Remote subcortical infarct in the anterior right frontal lobe is stable. 5. Aspects 10/10 * The above was relayed via text pager to Dr. Leonel Ramsay on 05/25/2022 at 16:20 . Electronically Signed   By: San Morelle M.D.   On: 05/25/2022 16:20    Microbiology: Results for orders placed or performed during the hospital encounter of 05/25/22  Resp Panel by RT-PCR (Flu A&B, Covid) Anterior Nasal  Swab     Status: None   Collection Time: 05/25/22  3:40 PM   Specimen: Anterior Nasal Swab   Result Value Ref Range Status   SARS Coronavirus 2 by RT PCR NEGATIVE NEGATIVE Final    Comment: (NOTE) SARS-CoV-2 target nucleic acids are NOT DETECTED.  The SARS-CoV-2 RNA is generally detectable in upper respiratory specimens during the acute phase of infection. The lowest concentration of SARS-CoV-2 viral copies this assay can detect is 138 copies/mL. A negative result does not preclude SARS-Cov-2 infection and should not be used as the sole basis for treatment or other patient management decisions. A negative result may occur with  improper specimen collection/handling, submission of specimen other than nasopharyngeal swab, presence of viral mutation(s) within the areas targeted by this assay, and inadequate number of viral copies(<138 copies/mL). A negative result must be combined with clinical observations, patient history, and epidemiological information. The expected result is Negative.  Fact Sheet for Patients:  EntrepreneurPulse.com.au  Fact Sheet for Healthcare Providers:  IncredibleEmployment.be  This test is no t yet approved or cleared by the [place] FDA and  has been authorized for detection and/or diagnosis of SARS-CoV-2 by FDA under an Emergency Use Authorization (EUA). This EUA will remain  in effect (meaning this test can be used) for the duration of the COVID-19 declaration under Section 564(b)(1) of the Act, 21 U.S.C.section 360bbb-3(b)(1), unless the authorization is terminated  or revoked sooner.       Influenza A by PCR NEGATIVE NEGATIVE Final   Influenza B by PCR NEGATIVE NEGATIVE Final    Comment: (NOTE) The Xpert Xpress SARS-CoV-2/FLU/RSV plus assay is intended as an aid in the diagnosis of influenza from Nasopharyngeal swab specimens and should not be used as a sole basis for treatment. Nasal washings and aspirates are unacceptable for Xpert Xpress SARS-CoV-2/FLU/RSV testing.  Fact Sheet for  Patients: EntrepreneurPulse.com.au  Fact Sheet for Healthcare Providers: IncredibleEmployment.be  This test is not yet approved or cleared by the [place] FDA and has been authorized for detection and/or diagnosis of SARS-CoV-2 by FDA under an Emergency Use Authorization (EUA). This EUA will remain in effect (meaning this test can be used) for the duration of the COVID-19 declaration under Section 564(b)(1) of the Act, 21 U.S.C. section 360bbb-3(b)(1), unless the authorization is terminated or revoked.  Performed at [facility], [place] [address], [place], [place] [address]     Labs: CBC: Recent Labs  Lab 05/25/22 1805 05/25/22 2017 05/26/22 0742  WBC 10.1  --  8.8  NEUTROABS 7.1  --  6.1  HGB 14.2 13.9 13.2  HCT 42.9 41.0 39.3  MCV 90.3  --  89.5  PLT 236  --  381   Basic Metabolic Panel: Recent Labs  Lab 05/25/22 1805 05/25/22 2017 05/26/22 0742  NA 139 136 139  K 4.0 4.7 4.2  CL 106 109 106  CO2 22  --  25  GLUCOSE 111* 96 113*  BUN 21 29* 17  CREATININE 0.98 1.00 1.07*  CALCIUM 9.8  --  9.7  MG  --   --  2.1   Liver Function Tests: Recent Labs  Lab 05/25/22 1805 05/26/22 0742  AST 19 20  ALT 14 14  ALKPHOS 78 78  BILITOT 1.4* 1.3*  PROT 7.4 7.1  ALBUMIN 3.8 3.5   CBG: Recent Labs  Lab 05/25/22 1618  GLUCAP 100*    Discharge time spent: greater than 30 minutes.  Signed: Patrecia Pour, MD [facility] 05/28/2022

## 2022-05-28 NOTE — Progress Notes (Signed)
Inpatient Rehabilitation Admission Medication Review by a Pharmacist  A complete drug regimen review was completed for this patient to identify any potential clinically significant medication issues.  High Risk Drug Classes Is patient taking? Indication by Medication  Antipsychotic Yes Compazine -prn nausea/vomiting  Anticoagulant Yes Lovenox- VTE prophylaxis  Antibiotic No   Opioid No   Antiplatelet Yes Aspirin, clopidogrel- antiplatelet  Hypoglycemics/insulin No   Vasoactive Medication No   Chemotherapy No   Other Yes Amlodipine- HTN Ezetimibe, pravastatin-HLD Melatonin, trazodone-sleep     Type of Medication Issue Identified Description of Issue Recommendation(s)  Drug Interaction(s) (clinically significant)     Duplicate Therapy     Allergy     No Medication Administration End Date     Incorrect Dose     Additional Drug Therapy Needed     Significant med changes from prior encounter (inform family/care partners about these prior to discharge). [place], Omega-3, Vitamin D, nitroglycerin 0.'4mg'$  sublingual,  Restart PTA meds when and if necessary during CIR admission or at time of discharge, if warranted.   Other       Clinically significant medication issues were identified that warrant physician communication and completion of prescribed/recommended actions by midnight of the next day:  No    Time spent performing this drug regimen review (minutes):  [place], [place] Pharmacist [phone] 05/28/2022 3:38 PM  Please check AMION for all [place] phone numbers After 10:00 PM, call [place]

## 2022-05-28 NOTE — Discharge Summary (Signed)
Physician Discharge Summary  Patient ID: Katelyn Powers MRN: [id number] DOB/AGE: 1932/06/28 88 y.o.  Admit date: 05/28/2022 Discharge date:   Discharge Diagnoses:  Principal Problem:   Cerebrovascular accident (CVA) of right thalamus [facility]) Active Problems:   Essential hypertension   Mixed hyperlipidemia   Memory loss   Insomnia   Discharged Condition: stable  Significant Diagnostic Studies: N/A  Labs:  Basic Metabolic Panel:    Latest Ref Rng & Units 06/12/2022    7:40 AM 06/10/2022    5:36 AM 06/04/2022    6:35 AM  BMP  Glucose 70 - 99 mg/dL 127  126  124   BUN 8 - 23 mg/dL '27  29  27   '$ Creatinine 0.44 - 1.00 mg/dL 1.09  1.03  1.13   Sodium 135 - 145 mmol/L 139  137  139   Potassium 3.5 - 5.1 mmol/L 4.3  4.0  4.3   Chloride 98 - 111 mmol/L 104  105  104   CO2 22 - 32 mmol/L '24  23  25   '$ Calcium 8.9 - 10.3 mg/dL 10.4  9.8  9.9       CBC:    Latest Ref Rng & Units 06/17/2022    6:16 AM 06/10/2022    5:36 AM 06/03/2022    7:21 AM  CBC  WBC 4.0 - 10.5 K/uL 9.1  8.3  7.9   Hemoglobin 12.0 - 15.0 g/dL 13.4  12.8  13.9   Hematocrit 36.0 - 46.0 % 39.9  38.3  41.6   Platelets 150 - 400 K/uL 189  196  211      CBG: No results for input(s): "GLUCAP" in the last 168 hours.   Brief HPI:   Katelyn Powers is a 88 y.o. female with history of Alzheimer's dementia, CVA. Melanoma; who was admitted on 05/25/22 with acute onset of generalized weakness, left visual field deficits and concerns of UTI. MRI brain done reveling acute/subacute infarct in right lateral thalamus. Due to R-ICA stenosis,  Brillinta was dicussed with family who elected to continue ASA/Plavix as latter had caused confusion in the past. She continued to be limited by left sided weakness, balance deficits and cognitive deficits and therapy was working on pre-gait activity. CIR was recommended due to functional decline.    Hospital Course: Katelyn Powers was admitted to rehab 05/28/2022 for inpatient therapies to consist of PT,  ST and OT at least three hours five days a week. Past admission physiatrist, therapy team and rehab RN have worked together to provide customized collaborative inpatient rehab. She continues on DAPT with follow up CBC showing H/H/Plt are stable.  Blood pressures were monitored on TID basis and has been controlled on amlodipine alone. She continues on melatonin to help manage insomnia. Left nipple skin changes noted without signs of infection and recommend follow up with PCP if desired.   Her po intake was noted to be poor, feeding tube was discussed briefly and palliative care was consulted to discuss [place]. Family was not ready to make decision re: code status but elected on care more on line with hospice support. Follow up check of lytes showed AKI as her intake continues to be 25-50% at meals despite assistance with feeding. Her rehab course has been affected by her cognitive deficits as well as variable participation. She currently requires max to total assist and Hospice will continue to follow and provide assistance after discharge.    Rehab course: During patient's stay in rehab weekly  team conferences were held to monitor patient's progress, set goals and discuss barriers to discharge. At admission, patient required +2 max assist with mobility and total assist with ADL tasks. She  has had improvement in activity tolerance, balance, postural control as well as ability to compensate for deficits. She requires max to total assist with ADL tasks. She requires hoyer lift for transfers. Family education has been completed.    Discharge disposition: 01-Home or Self Care  Diet: Regular.   Special Instructions: Medications crushed with pudding/puree. Routine pressure relief measures.   Allergies as of 06/19/2022   No Known Allergies      Medication List     STOP taking these medications    Ginkgo Biloba 120 MG Caps   losartan 50 MG tablet Commonly known as: COZAAR   VITAMIN D3 PO        TAKE these medications    acetaminophen 325 MG tablet Commonly known as: TYLENOL Take 1-2 tablets (325-650 mg total) by mouth every 4 (four) hours as needed for mild pain.   amLODipine 5 MG tablet Commonly known as: NORVASC Take 1 tablet (5 mg total) by mouth daily.   aspirin EC 81 MG tablet Take 1 tablet (81 mg total) by mouth daily. Swallow whole.   clopidogrel 75 MG tablet Commonly known as: PLAVIX Take 75 mg by mouth daily.   ezetimibe 10 MG tablet Commonly known as: ZETIA Take 1 tablet (10 mg total) by mouth daily. Please make yearly appt with [name] for May 2023 for future refills. Thank you 1st attempt   Melatonin 10 MG Tabs Take 10 mg by mouth at bedtime. NEEDS NIGHTLY TO SLEEP OR WILL BE RESTLESS   methocarbamol 500 MG tablet Commonly known as: ROBAXIN Take 1 tablet (500 mg total) by mouth every 6 (six) hours as needed for muscle spasms.   nitroGLYCERIN 0.4 MG SL tablet Commonly known as: NITROSTAT Place 1 tablet (0.4 mg total) under the tongue every 5 (five) minutes as needed for chest pain.   OMEGA-3 RX COMPLETE PO Take 1 capsule by mouth at bedtime.   pravastatin 40 MG tablet Commonly known as: PRAVACHOL Take 1 tablet (40 mg total) by mouth daily at 6 PM.        Follow-up Information     Cyndi Bender, PA-C. Call.   Specialty: Physician Assistant Why: Call in 1-2 days to make arrangements for hospital follow-up if you desire Contact information: [place] [phone] [phone]         Belva Crome, MD .   Specialty: Cardiology Why: As needed Contact information: [address] [place] [address] [phone]         Britt Bottom, MD. Call.   Specialty: Neurology Why: As needed Contact information: [place] [address] [phone]         Charlett Blake, MD Follow up.   Specialty: Physical Medicine and Rehabilitation Why: As needed Contact  information: [place] [id number] [phone]                 Signed: Bary Leriche 06/19/2022, 9:37 AM

## 2022-05-28 NOTE — H&P (Signed)
Physical Medicine and Rehabilitation Admission H&P    CC: Functional deficits secondary to right thalamic infarct  HPI: Katelyn Powers is an 88 year old female with a history of Alzheimer's dementia who developed acute onset of generalized weakness, left vision deficit and strong smelling urine on 05/25/2022. EMS was called and she presented to the [facility] ED with left sided weakness.  Began work-up for UT. Family remained concerned for possible stroke and code stroke was called. [name] was consulted. The patient has had her baseline cognition for approximately 2 years and was able to feed and dress herself prior to admission.  She has 24 hour assistance from family.   Neurology discussed changing therapy to Brilinta, however this was deferred by the family citing previous intolerance of this medication notably increased confusion.  SLP evaluation obtained and the patient is currently tolerating a heart healthy diet with medications crushed and administered in applesauce.  She has a PureWick in place for urinary incontinence and urinalysis on 7/30 revealed straw-colored urine, protein 100 mg/dL and rare bacteria.  Medical history is significant for prior stoke December, 2022 with tenecteplase administration with excellent outcome. Residual expressive aphasia and decline in memory. She is followed by [name]. Discussed stenting of right ICA due to 60-79% stenosis on 01/14/2022. This was deferred. She is maintained at home on Plavix and aspirin. History of melanoma.  The patient requires inpatient physical medicine and rehabilitation evaluations and treatment secondary to dysfunction due to right thalamic infarct.    Review of Systems  Reason unable to perform ROS: limited due to dementia.  Constitutional:  Negative for fever.  Eyes:  Positive for blurred vision.       Diminished vision in left eye  Respiratory:  Negative for cough.   Cardiovascular:  Negative for chest pain.   Gastrointestinal:  Positive for constipation. Negative for vomiting.       Just given suppository  Genitourinary:        Purewick in place; clear, straw colored urine  Musculoskeletal:        No reports of pain PTA  Skin:        Daughter reports crusted area to left nipple  Neurological:  Negative for headaches.   Past Medical History:  Diagnosis Date   Cataract    Gout    Hyperlipidemia    Hypertension    Memory loss    NSTEMI (non-ST elevated myocardial infarction) ([place]) 08/2020   Right-sided carotid artery disease ([place]) 09/17/2020   carotid doppler wth >60% occlusion   Past Surgical History:  Procedure Laterality Date   MELANOMA EXCISION Right 10/28/1970   level 2   SUPERFICIAL LYMPH NODE BIOPSY / EXCISION  10/28/1982   amelanotic melanoma   TUBAL LIGATION     Family History  Problem Relation Age of Onset   Colon cancer Sister 62   Colon polyps Brother    Social History:  reports that she has quit smoking. She has never used smokeless tobacco. She reports that she does not drink alcohol and does not use drugs. Allergies: No Known Allergies Medications Prior to Admission  Medication Sig Dispense Refill   amLODipine (NORVASC) 5 MG tablet Take 1 tablet (5 mg total) by mouth daily.     aspirin EC 81 MG EC tablet Take 1 tablet (81 mg total) by mouth daily. Swallow whole. 30 tablet 11   Cholecalciferol (VITAMIN D3 PO) Take 1 capsule by mouth daily. Vit D3 (4,000U), Vit A, K2+ 4  immune boosters- Take 1 capsule by mouth daily     clopidogrel (PLAVIX) 75 MG tablet Take 75 mg by mouth daily.     ezetimibe (ZETIA) 10 MG tablet Take 1 tablet (10 mg total) by mouth daily. Please make yearly appt with [name] for May 2023 for future refills. Thank you 1st attempt 90 tablet 0   Ginkgo Biloba 120 MG CAPS Take 120 mg by mouth in the morning.     losartan (COZAAR) 50 MG tablet Take 1 tablet (50 mg total) by mouth daily.     Melatonin 10 MG TABS Take 10 mg by mouth at bedtime. NEEDS  NIGHTLY TO SLEEP OR WILL BE RESTLESS     nitroGLYCERIN (NITROSTAT) 0.4 MG SL tablet Place 1 tablet (0.4 mg total) under the tongue every 5 (five) minutes as needed for chest pain. 25 tablet 2   Omega-3-acid Eth &Multivit/Min (OMEGA-3 RX COMPLETE PO) Take 1 capsule by mouth at bedtime.     pravastatin (PRAVACHOL) 40 MG tablet Take 1 tablet (40 mg total) by mouth daily at 6 PM. 30 tablet 1      Home: Home Living Family/patient expects to be discharged to:: Private residence Living Arrangements: Alone, Other (Comment) (family 24/7) Available Help at Discharge: Family, Available 24 hours/day Type of Home: House Home Access: Stairs to enter [employer] of Steps: 1 Entrance Stairs-Rails: None Home Layout: One level Bathroom Shower/Tub: Sponge bathes at baseline Bathroom Toilet: Standard Bathroom Accessibility: Yes Home Equipment: Conservation officer, nature (2 wheels), [employer] - single point, Civil engineer, contracting Additional Comments: [name] family want AIR  Lives With: Other (Comment) (daughter one week at a time)   Functional History: Prior Function Prior Level of Function : Needs assist Mobility Comments: without A or hand held assist ADLs Comments: pt requires assistance with bathing and IADLs  Functional Status:  Mobility: Bed Mobility Overal bed mobility: Needs Assistance Bed Mobility: Supine to Sit, Sit to Supine Supine to sit: Max assist, HOB elevated Sit to supine: HOB elevated, Mod assist General bed mobility comments: L lateral lean  inititally and then able to find midline and stay there Transfers Overall transfer level: Needs assistance Equipment used:  (gait belt) Transfers: Sit to/from Stand Sit to Stand: From elevated surface, Mod assist General transfer comment: achieved nearly full standing x 3 reps (hips flexed with legs pushing against bed and shoulders slightly forward) Ambulation/Gait General Gait Details: unable to fully stand for pre-gait Pre-gait activities: attempted  side steps to left toward head of stretcher, maxA for one step with PT sliding pt's left foot and totalA to weight shift    ADL: ADL Overall ADL's : Needs assistance/impaired Eating/Feeding: Total assistance Grooming: Moderate assistance, Wash/dry hands, Wash/dry face, Oral care, Brushing hair Grooming Details (indicate cue type and reason): set-upA to brush hair, otherwise requiring modA Upper Body Bathing: Moderate assistance, Sitting, Bed level Lower Body Bathing: Total assistance, Bed level, Sit to/from stand Upper Body Dressing : Moderate assistance, Sitting, Bed level Lower Body Dressing: Total assistance, Sitting/lateral leans, Bed level, Cueing for safety, Cueing for sequencing Toilet Transfer: Total assistance, +2 for physical assistance, +2 for safety/equipment Toilet Transfer Details (indicate cue type and reason): standing with maxA +1 but not fully able to extend and relying on OT to keep pt up, barely clearing mattress Toileting- Clothing Manipulation and Hygiene: Total assistance Functional mobility during ADLs: Maximal assistance, +2 for physical assistance, +2 for safety/equipment, Cueing for safety, Cueing for sequencing General ADL Comments: Pt modA to [place] for ADL  tasks. Pt can comb hair with set-upA, but unable to state "comb." Pt sitting EOB with fair balance and minguard as needed. Pt currently, standing briefly, but not able ot fully extend and clear bottom from bed.  Cognition: Cognition Overall Cognitive Status: History of cognitive impairments - at baseline Arousal/Alertness: Awake/alert Orientation Level: Oriented to person Attention: Sustained Sustained Attention: Impaired Sustained Attention Impairment: Verbal basic, Functional basic Memory: Impaired Memory Impairment: Storage deficit Awareness: Impaired Awareness Impairment: Intellectual impairment Problem Solving: Impaired Safety/Judgment: Impaired Cognition Arousal/Alertness: Awake/alert Behavior  During Therapy: Flat affect Overall Cognitive Status: History of cognitive impairments - at baseline General Comments: Pt with advanced dementia at baseline. Pt follows 1 step commands with increased time. L inattention present. Impaired awareness of deficits and safety.  Physical Exam: Blood pressure (!) 147/73, pulse 75, temperature 98 F (36.7 C), temperature source Oral, resp. rate 17, height 5' 2.5" (1.588 m), weight 71.8 kg, SpO2 94 %. Physical Exam HENT:     Right Ear: External ear normal.     Left Ear: External ear normal.     Mouth/Throat:     Pharynx: Oropharynx is clear.  Eyes:     General: No scleral icterus.    Conjunctiva/sclera: Conjunctivae normal.  Cardiovascular:     Rate and Rhythm: Normal rate and regular rhythm.     Heart sounds: No murmur heard.    No gallop.  Pulmonary:     Effort: Pulmonary effort is normal.     Breath sounds: Normal breath sounds.  Abdominal:     General: Bowel sounds are normal. There is no distension.     Palpations: Abdomen is soft.  Musculoskeletal:        General: Normal range of motion.     Cervical back: Normal range of motion.     Right lower leg: No edema.     Left lower leg: No edema.  Skin:    General: Skin is warm.     Comments: Left nipple lesion  Neurological:     Mental Status: She is alert.     Comments: Pt is alert. Oriented to self only. Speech fairly clear. Unable to name simple objects, sometimes with paraphasias. Significant memory deficits. Did follow simple one step commands intermittently. Minimal insight and awareness. Does move all 4 limbs right more spontaneously than left and senses pain in all 4's. Right gaze preference, left HH.    Psychiatric:     Comments: Pleasant, smiling     No results found for this or any previous visit (from the past 48 hour(s)). ECHOCARDIOGRAM COMPLETE BUBBLE STUDY  Result Date: 05/27/2022    ECHOCARDIOGRAM REPORT   Patient Name:   VIANNY SCHRAEDER Date of Exam: 05/27/2022 Medical  Rec #:  [id number]   Height:       62.5 in Accession #:    [id number]  Weight:       158.3 lb Date of Birth:  [birth year]  BSA:          1.741 m Patient Age:    16 years    BP:           132/79 mmHg Patient Gender: F           HR:           77 bpm. Exam Location:  Inpatient Procedure: 2D Echo, Cardiac Doppler, Color Doppler and Saline Contrast Bubble            Study Indications:    Stroke 434.91 / I63.9  History:        Patient has prior history of Echocardiogram examinations, most                 recent 09/26/2021. Previous Myocardial Infarction, Carotid                 Disease; Risk Factors:Hypertension and Dyslipidemia.  Sonographer:    Darlina Sicilian RDCS Referring Phys: [id number] [place]  1. Left ventricular ejection fraction by 3D volume is 54 %. The left ventricle has low normal function. The left ventricle has no regional wall motion abnormalities. Left ventricular diastolic parameters are consistent with Grade I diastolic dysfunction  (impaired relaxation). Elevated left atrial pressure.  2. Right ventricular systolic function is normal. The right ventricular size is normal. There is normal pulmonary artery systolic pressure.  3. The mitral valve is degenerative. No evidence of mitral valve regurgitation. Mild mitral stenosis. The mean mitral valve gradient is 4.0 mmHg.  4. DI 0.46. The aortic valve is tricuspid. There is mild calcification of the aortic valve. There is mild thickening of the aortic valve. Aortic valve regurgitation is trivial. Mild to moderate aortic valve stenosis. Aortic valve area, by VTI measures 1.17 cm. Aortic valve mean gradient measures 12.0 mmHg. Aortic valve Vmax measures 2.21 m/s.  5. The inferior vena cava is normal in size with greater than 50% respiratory variability, suggesting right atrial pressure of 3 mmHg. Conclusion(s)/Recommendation(s): No intracardiac source of embolism detected on this transthoracic study. Consider a transesophageal  echocardiogram to exclude cardiac source of embolism if clinically indicated. FINDINGS  Left Ventricle: Left ventricular ejection fraction by 3D volume is 54 %. The left ventricle has low normal function. The left ventricle has no regional wall motion abnormalities. The left ventricular internal cavity size was normal in size. There is no left ventricular hypertrophy. Left ventricular diastolic parameters are consistent with Grade I diastolic dysfunction (impaired relaxation). Elevated left atrial pressure. Right Ventricle: The right ventricular size is normal. No increase in right ventricular wall thickness. Right ventricular systolic function is normal. There is normal pulmonary artery systolic pressure. The tricuspid regurgitant velocity is 2.05 m/s, and  with an assumed right atrial pressure of 3 mmHg, the estimated right ventricular systolic pressure is 14.4 mmHg. Left Atrium: Left atrial size was normal in size. Right Atrium: Right atrial size was normal in size. Pericardium: There is no evidence of pericardial effusion. Mitral Valve: The mitral valve is degenerative in appearance. There is mild thickening of the mitral valve leaflet(s). There is mild calcification of the mitral valve leaflet(s). No evidence of mitral valve regurgitation. Mild mitral valve stenosis. MV peak gradient, 9.9 mmHg. The mean mitral valve gradient is 4.0 mmHg. Tricuspid Valve: The tricuspid valve is normal in structure. Tricuspid valve regurgitation is mild . No evidence of tricuspid stenosis. Aortic Valve: DI 0.46. The aortic valve is tricuspid. There is mild calcification of the aortic valve. There is mild thickening of the aortic valve. Aortic valve regurgitation is trivial. Aortic regurgitation PHT measures 439 msec. Mild to moderate aortic stenosis is present. Aortic valve mean gradient measures 12.0 mmHg. Aortic valve peak gradient measures 19.5 mmHg. Aortic valve area, by VTI measures 1.17 cm. Pulmonic Valve: The pulmonic  valve was normal in structure. Pulmonic valve regurgitation is not visualized. No evidence of pulmonic stenosis. Aorta: The aortic root is normal in size and structure. Venous: The inferior vena cava is normal in size with greater than 50% respiratory variability, suggesting right atrial pressure of  3 mmHg. IAS/Shunts: No atrial level shunt detected by color flow Doppler. Agitated saline contrast was given intravenously to evaluate for intracardiac shunting.  LEFT VENTRICLE PLAX 2D LVIDd:         4.20 cm         Diastology LVIDs:         3.00 cm         LV e' medial:    2.34 cm/s LV PW:         0.70 cm         LV E/e' medial:  30.3 LV IVS:        0.80 cm         LV e' lateral:   3.57 cm/s LVOT diam:     1.90 cm         LV E/e' lateral: 19.9 LV SV:         52 LV SV Index:   30 LVOT Area:     2.84 cm        3D Volume EF                                LV 3D EF:    Left                                             ventricul                                             ar                                             ejection                                             fraction                                             by 3D                                             volume is                                             54 %.                                 3D Volume EF:  3D EF:        54 %                                LV EDV:       97 ml                                LV ESV:       44 ml                                LV SV:        53 ml RIGHT VENTRICLE RV S prime:     19.40 cm/s TAPSE (M-mode): 2.0 cm LEFT ATRIUM             Index        RIGHT ATRIUM          Index LA diam:        3.20 cm 1.84 cm/m   RA Area:     8.17 cm LA Vol (A2C):   31.6 ml 18.15 ml/m  RA Volume:   14.50 ml 8.33 ml/m LA Vol (A4C):   33.0 ml 18.95 ml/m LA Biplane Vol: 33.4 ml 19.18 ml/m  AORTIC VALVE AV Area (Vmax):    1.33 cm AV Area (Vmean):   1.12 cm AV Area (VTI):     1.17 cm AV Vmax:           221.00  cm/s AV Vmean:          162.667 cm/s AV VTI:            0.441 m AV Peak Grad:      19.5 mmHg AV Mean Grad:      12.0 mmHg LVOT Vmax:         104.00 cm/s LVOT Vmean:        64.200 cm/s LVOT VTI:          0.182 m LVOT/AV VTI ratio: 0.41 AI PHT:            439 msec  AORTA Ao Root diam: 3.10 cm Ao Asc diam:  3.30 cm MITRAL VALVE                TRICUSPID VALVE MV Area (PHT): 2.01 cm     TR Peak grad:   16.8 mmHg MV Area VTI:   1.49 cm     TR Vmax:        205.00 cm/s MV Peak grad:  9.9 mmHg MV Mean grad:  4.0 mmHg     SHUNTS MV Vmax:       1.57 m/s     Systemic VTI:  0.18 m MV Vmean:      87.0 cm/s    Systemic Diam: 1.90 cm MV Decel Time: 377 msec MV E velocity: 71.00 cm/s MV A velocity: 129.00 cm/s MV E/A ratio:  0.55 Candee Furbish MD Electronically signed by Candee Furbish MD Signature Date/Time: 05/27/2022/5:21:57 PM    Final       Blood pressure (!) 147/73, pulse 75, temperature 98 F (36.7 C), temperature source Oral, resp. rate 17, height 5' 2.5" (1.588 m), weight 71.8 kg, SpO2 94 %.  Medical Problem List and Plan: 1. Functional deficits secondary to right thalamic infarct; Alzheimner's dementia and prior stroke/TIA with left  arm weakness and left hemianopsia  -patient may shower  -ELOS/Goals: 14-21 days, supervision to min assist goals 2.  Antithrombotics: -DVT/anticoagulation:  Pharmaceutical: Lovenox  -antiplatelet therapy: Plavix and aspirin 81 mg (family declines Brilinta) 3. Pain Management: Tylenol as needed 4. Mood/Behavior/Sleep: LCSW to evaluate and provide emotional support  -antipsychotic agents: n/a 5. Neuropsych/cognition: This patient is not capable of making decisions on her own behalf. 6. Skin/Wound Care: Routine skin care checks 7. Fluids/Electrolytes/Nutrition: Routine I's and O's and follow-up chemistries  -heart healthy diet/thin liq; crush meds in pureed 8: Hypertension: continue amlodipine 5 mg daily  -Cozaar 50 mg daily is being held 9: Hyperlipidemia: continue Zetia,  Pravachol 10: CAD: stable, no CP 11: Alzheimer's dementia; follows with Dr. Felecia Shelling. Does not tolerate donepezil 12: Diastolic CHF: stable, no SOB or edema 13: Insomnia: continue melatonin>>need to give by 7-7:30 pm nightly 14: Left nipple skin change: no sign of infection; follow-up as outpatient 15: Urinary incontinence:   -attempt timed voids  -check PVR's     Barbie Banner, PA-C 05/28/2022

## 2022-05-28 NOTE — Progress Notes (Signed)
PMR Admission Coordinator Pre-Admission Assessment  Patient: Katelyn Powers is an 88 y.o., female MRN: [id number] DOB: 08/18/1932 Height: 5' 2.5" (158.8 cm) Weight: 71.8 kg  Insurance Information HMO:  yes   PPO:      PCP:      IPA:      80/20:      OTHER:  PRIMARY: [employer] Medicare      Policy#: [id number]      Group #: [id number]    Pre-Cert#: [id number]       Benefits:  Phone #: [email or web address]  Fax:# [phone]      Name:  Eff. Date:  10/28/21    Deduct:  $0     Out of Pocket Max: $4,500 ( $30 met)       CIR: $325/day co pay for days 1-5, $0/day co pay for days 6+     SNF: $196 /day for days 21-43, $0/day co pay for days 44-100 lim to 100 days per calendar year.  Approved via call, for dates 05/28/22-06/06/22 fax to number above.  Outpatient:  $20/visit co pay     Home Health:  100% coverage, $0 co insurance limited by medical necessity      DME:  80%   20% co-insurance Providers:  SECONDARY:       Policy#:      Phone#:   Financial Counselor:       Phone#:   The "Data Collection Information Summary" for patients in Inpatient Rehabilitation Facilities with attached "Privacy Act Statement-Health Care Records" was provided and verbally reviewed with: N/A  Emergency Contact Information Contact Information     Name Relation Home Work Mobile   Fennell,terrie Daughter   [phone]   Stambaugh-Russell,[name] Daughter [phone]  [phone]       Current Medical History  Patient Admitting Diagnosis: CVA History of Present Illness: 88 y.o. female presents to [facility] on [facility]25/2022 with L weakness, imbalance.  MRI reveals Acute/subacute nonhemorrhagic infarct involving the lateral right thalamus. T2 signal changes are present within the infarct. Advanced atrophy and diffuse white matter disease likely reflects the sequela of chronic microvascular ischemia. Remote encephalomalacia of the inferior right cerebellum. CTA neck also demonstrates High-grade stenosis Right ICA origin and bulb approaching  a RADIOGRAPHIC STRING SIGN. But the anterior circulation is stable from the MRA yesterday, with mild stenosis at both the right MCA and ACA origins.  Expected CT appearance of the right thalamic infarct. No associated hemorrhage or mass effect. No new intracranial abnormality.PMH includes dementia, HTN, HLD, carotid stenosis, NSTEMI, CHF. Per Neurology, 2D echo pending, norvasc resumed 05/26/22, permissive HTN (<220/120) . High sensitivity troponin elevated, but no need of further ischemia work up at this time.  Therapies recommending AIR.     Complete NIHSS TOTAL: 10  Patient's medical record from [place] has been reviewed by the rehabilitation admission coordinator and physician.  Past Medical History  Past Medical History:  Diagnosis Date   Cataract    Gout    Hyperlipidemia    Hypertension    Memory loss    NSTEMI (non-ST elevated myocardial infarction) (HCC) 08/2020   Right-sided carotid artery disease (HCC) 09/17/2020   carotid doppler wth >60% occlusion    Has the patient had major surgery during 100 days prior to admission? No  Family History   family history includes Colon cancer (age of onset: 59) in her sister; Colon polyps in her brother.  Current Medications  Current Facility-Administered Medications:    acetaminophen (TYLENOL) tablet 650 mg, 650   mg, Oral, Q6H PRN **OR** acetaminophen (TYLENOL) suppository 650 mg, 650 mg, Rectal, Q6H PRN, Shalhoub, George J, MD   amLODipine (NORVASC) tablet 5 mg, 5 mg, Oral, Daily, Shafer, Devon, NP, 5 mg at 05/27/22 0912   aspirin EC tablet 81 mg, 81 mg, Oral, Daily, Shalhoub, George J, MD, 81 mg at 05/27/22 0912   clopidogrel (PLAVIX) tablet 75 mg, 75 mg, Oral, Daily, Shafer, Devon, NP, 75 mg at 05/27/22 0912   enoxaparin (LOVENOX) injection 40 mg, 40 mg, Subcutaneous, Q24H, Shalhoub, George J, MD, 40 mg at 05/27/22 0912   ezetimibe (ZETIA) tablet 10 mg, 10 mg, Oral, Daily, Shalhoub, George J, MD, 10 mg at 05/27/22 0912    hydrALAZINE (APRESOLINE) injection 10 mg, 10 mg, Intravenous, Q6H PRN, Shalhoub, George J, MD   melatonin tablet 10 mg, 10 mg, Oral, QHS, Shalhoub, George J, MD, 10 mg at 05/26/22 2201   nitroGLYCERIN (NITROSTAT) SL tablet 0.4 mg, 0.4 mg, Sublingual, Q5 min PRN, Shalhoub, George J, MD   ondansetron (ZOFRAN) tablet 4 mg, 4 mg, Oral, Q6H PRN **OR** ondansetron (ZOFRAN) injection 4 mg, 4 mg, Intravenous, Q6H PRN, Shalhoub, George J, MD   polyethylene glycol (MIRALAX / GLYCOLAX) packet 17 g, 17 g, Oral, Daily PRN, Shalhoub, George J, MD, 17 g at 05/27/22 0912   pravastatin (PRAVACHOL) tablet 40 mg, 40 mg, Oral, q1800, Shalhoub, George J, MD, 40 mg at 05/26/22 1759  Patients Current Diet:  Diet Order             Diet Heart Room service appropriate? Yes; Fluid consistency: Thin  Diet effective now                   Precautions / Restrictions Precautions Precautions: Fall Restrictions Weight Bearing Restrictions: No   Has the patient had 2 or more falls or a fall with injury in the past year? No  Prior Activity Level Limited Community (1-2x/wk): mod I  Prior Functional Level Self Care: Did the patient need help bathing, dressing, using the toilet or eating? Needed some help  Indoor Mobility: Did the patient need assistance with walking from room to room (with or without device)? Needed some help  Stairs: Did the patient need assistance with internal or external stairs (with or without device)? Needed some help  Functional Cognition: Did the patient need help planning regular tasks such as shopping or remembering to take medications? Needed some help  Patient Information Are you of Hispanic, Latino/a,or Spanish origin?: A. No, not of Hispanic, Latino/a, or Spanish origin What is your race?: A. White Do you need or want an interpreter to communicate with a doctor or health care staff?: 0. No  Patient's Response To:  Health Literacy and Transportation Is the patient able to  respond to health literacy and transportation needs?: No In the past 12 months, has lack of transportation kept you from medical appointments or from getting medications?: No In the past 12 months, has lack of transportation kept you from meetings, work, or from getting things needed for daily living?: No  Home Assistive Devices / Equipment Home Assistive Devices/Equipment: None Home Equipment: Rolling Walker (2 wheels), Cane - single point, Shower seat  Prior Device Use: Indicate devices/aids used by the patient prior to current illness, exacerbation or injury? None of the above  Current Functional Level Cognition  Arousal/Alertness: Awake/alert Overall Cognitive Status: History of cognitive impairments - at baseline Orientation Level: Disoriented to time, Disoriented to place General Comments: Pt with advanced dementia at baseline.   Pt follows 1 step commands with increased time. L inattention present. Impaired awareness of deficits and safety. Attention: Sustained Sustained Attention: Impaired Sustained Attention Impairment: Verbal basic, Functional basic Memory: Impaired Memory Impairment: Storage deficit Awareness: Impaired Awareness Impairment: Intellectual impairment Problem Solving: Impaired Safety/Judgment: Impaired    Extremity Assessment (includes Sensation/Coordination)  Upper Extremity Assessment: RUE deficits/detail, LUE deficits/detail RUE Deficits / Details: 3+/5 strength LUE Deficits / Details: 2/5 strength; inatentive, poor spatial awareness, coordination and perception LUE Coordination: decreased fine motor, decreased gross motor  Lower Extremity Assessment: LLE deficits/detail LLE Deficits / Details: 3+/5 strength LLE Coordination: decreased gross motor    ADLs  Overall ADL's : Needs assistance/impaired Eating/Feeding: Total assistance Grooming: Moderate assistance, Wash/dry hands, Wash/dry face, Oral care, Brushing hair Grooming Details (indicate cue type  and reason): set-upA to brush hair, otherwise requiring modA Upper Body Bathing: Moderate assistance, Sitting, Bed level Lower Body Bathing: Total assistance, Bed level, Sit to/from stand Upper Body Dressing : Moderate assistance, Sitting, Bed level Lower Body Dressing: Total assistance, Sitting/lateral leans, Bed level, Cueing for safety, Cueing for sequencing Toilet Transfer: Total assistance, +2 for physical assistance, +2 for safety/equipment Toilet Transfer Details (indicate cue type and reason): standing with maxA +1 but not fully able to extend and relying on OT to keep pt up, barely clearing mattress Toileting- Clothing Manipulation and Hygiene: Total assistance Functional mobility during ADLs: Maximal assistance, +2 for physical assistance, +2 for safety/equipment, Cueing for safety, Cueing for sequencing General ADL Comments: Pt modA to totalA for ADL tasks. Pt can comb hair with set-upA, but unable to state "comb." Pt sitting EOB with fair balance and minguard as needed. Pt currently, standing briefly, but not able ot fully extend and clear bottom from bed.    Mobility  Overal bed mobility: Needs Assistance Bed Mobility: Supine to Sit, Sit to Supine Supine to sit: Max assist, HOB elevated Sit to supine: HOB elevated, Mod assist General bed mobility comments: L lateral lean  inititally and then able to find midline and stay there    Transfers  Overall transfer level: Needs assistance Equipment used:  (gait belt) Transfers: Sit to/from Stand Sit to Stand: From elevated surface, Mod assist General transfer comment: achieved nearly full standing x 3 reps (hips flexed with legs pushing against bed and shoulders slightly forward)    Ambulation / Gait / Stairs / Wheelchair Mobility  Ambulation/Gait General Gait Details: unable to fully stand for pre-gait Pre-gait activities: attempted side steps to left toward head of stretcher, maxA for one step with PT sliding pt's left foot and  totalA to weight shift    Posture / Balance Dynamic Sitting Balance Sitting balance - Comments: minguardA; not requiring physical assist for L lateral lean after initial assist Balance Overall balance assessment: Needs assistance Sitting-balance support: Bilateral upper extremity supported, Feet supported Sitting balance-Leahy Scale: Poor Sitting balance - Comments: minguardA; not requiring physical assist for L lateral lean after initial assist Postural control: Left lateral lean Standing balance support: Bilateral upper extremity supported Standing balance-Leahy Scale: Poor Standing balance comment: mod A for stability    Special needs/care consideration Skin intact    Previous Home Environment (from acute therapy documentation) Living Arrangements: Alone, Other (Comment) (family 24/7)  Lives With: Other (Comment) (daughter one week at a time) Available Help at Discharge: Family, Available 24 hours/day Type of Home: House Home Layout: One level Home Access: Stairs to enter Entrance Stairs-Rails: None Entrance Stairs-Number of Steps: 1 Bathroom Shower/Tub: Sponge bathes at baseline Bathroom Toilet:   Standard Bathroom Accessibility: Yes Home Care Services: No Additional Comments: [name] family want AIR  Discharge Living Setting Plans for Discharge Living Setting: Patient's home Type of Home at Discharge: House Discharge Home Layout: One level Discharge Home Access: Stairs to enter Entrance Stairs-Number of Steps: 5 Discharge Bathroom Shower/Tub: Tub only Discharge Bathroom Toilet: Handicapped height Discharge Bathroom Accessibility: Yes How Accessible: Accessible via walker Does the patient have any problems obtaining your medications?: No  Social/Family/Support Systems Anticipated Caregiver: Daughters [name] and [name] Anticipated Caregiver's Contact Information: [name]-[phone] Caregiver Availability: 24/7 Discharge Plan Discussed with Primary Caregiver: Yes Is  Caregiver In Agreement with Plan?: Yes Does Caregiver/Family have Issues with Lodging/Transportation while Pt is in Rehab?: No  Goals Patient/Family Goal for Rehab: mod I with PT, OT, SLP Expected length of stay: 14-21 days Pt/Family Agrees to Admission and willing to participate: Yes Program Orientation Provided & Reviewed with Pt/Caregiver Including Roles  & Responsibilities: Yes  Barriers to Discharge: Insurance for SNF coverage  Decrease burden of Care through IP rehab admission: OtherN/A  Possible need for SNF placement upon discharge: not aniticpated  Patient Condition: I have reviewed medical records from [place], spoken with  TOC , and daughter. I met with patient at the bedside for inpatient rehabilitation assessment.  Patient will benefit from ongoing PT and OT, can actively participate in 3 hours of therapy a day 5 days of the week, and can make measurable gains during the admission.  Patient will also benefit from the coordinated team approach during an Inpatient Acute Rehabilitation admission.  The patient will receive intensive therapy as well as Rehabilitation physician, nursing, social worker, and care management interventions.  Due to safety, skin/wound care, disease management, medication administration, and patient education the patient requires 24 hour a day rehabilitation nursing.  The patient is currently Mod A with mobility and basic ADLs.  Discharge setting and therapy post discharge at home with home health is anticipated.  Patient has agreed to participate in the Acute Inpatient Rehabilitation Program and will admit 05/28/22.  Preadmission Screen Completed By:  Abou Sterkel, 05/27/2022 2:27 PM ______________________________________________________________________   Discussed status with Dr. Swartz on 05/28/22 at 9:30 am and received approval for admission today.  Admission Coordinator:  Axiel Fjeld, PT, time 10:20 am/Date 05/28/22   Assessment/Plan: Diagnosis:  CVA Does the need for close, 24 hr/day Medical supervision in concert with the patient's rehab needs make it unreasonable for this patient to be served in a less intensive setting? Yes Co-Morbidities requiring supervision/potential complications: gout, htn, CAD Due to bladder management, bowel management, safety, skin/wound care, disease management, medication administration, pain management, and patient education, does the patient require 24 hr/day rehab nursing? Yes Does the patient require coordinated care of a physician, rehab nurse, PT, OT, and SLP to address physical and functional deficits in the context of the above medical diagnosis(es)? Yes Addressing deficits in the following areas: balance, endurance, locomotion, strength, transferring, bowel/bladder control, bathing, dressing, feeding, grooming, toileting, cognition, speech, and psychosocial support Can the patient actively participate in an intensive therapy program of at least 3 hrs of therapy 5 days a week? Yes The potential for patient to make measurable gains while on inpatient rehab is excellent Anticipated functional outcomes upon discharge from inpatient rehab: modified independent PT, modified independent OT, modified independent SLP Estimated rehab length of stay to reach the above functional goals is: 14-21 days Anticipated discharge destination: Home 10. Overall Rehab/Functional Prognosis: excellent   MD Signature: Zachary T. Swartz, MD, FAAPMR [facility] Medical Director Rehabilitation Services 05/28/2022  

## 2022-05-28 NOTE — Progress Notes (Signed)
Inpatient Rehab Admissions Coordinator:   Received insurance approval for this patient to move to CIR. We have a bed available, will assess medical readiness for potential admit today.   Rehab Admissons Coordinator [place], [place], [place] [phone]

## 2022-05-28 NOTE — TOC Transition Note (Signed)
Transition of Care [facility]) - CM/SW Discharge Note   Patient Details  Name: Katelyn Powers MRN: [id number] Date of Birth: 02-09-1932  [facility] CM/SW Contact:  Cyndi Bender, RN Phone Number: 05/28/2022, 10:14 AM   Clinical Narrative:      Patient stable to discharge to CIR today. TOC  will sign off.     Patient Goals and CMS Choice        Discharge Placement                       Discharge Plan and Services                                     Social Determinants of Health (SDOH) Interventions     Readmission Risk Interventions     No data to display

## 2022-05-28 NOTE — Progress Notes (Signed)
Approach patient room with assigned NT, daughter at bedside in recliner and extremely upset concerning waking up her mom and her self in order to provide nursing care. Explained  to daughter regarding unit protocol and procedures and was informed that she". does not care about that because her mom is asleep and need her rest", Writer was unable to provide supportive and effective communication at this time and informed the daughter to speak with leadership.in the morning

## 2022-05-29 ENCOUNTER — Encounter (HOSPITAL_COMMUNITY): Payer: Self-pay | Admitting: Physical Medicine & Rehabilitation

## 2022-05-29 DIAGNOSIS — I6381 Other cerebral infarction due to occlusion or stenosis of small artery: Secondary | ICD-10-CM | POA: Diagnosis not present

## 2022-05-29 LAB — COMPREHENSIVE METABOLIC PANEL
ALT: 12 U/L (ref 0–44)
AST: 15 U/L (ref 15–41)
Albumin: 3.4 g/dL — ABNORMAL LOW (ref 3.5–5.0)
Alkaline Phosphatase: 71 U/L (ref 38–126)
Anion gap: 10 (ref 5–15)
BUN: 25 mg/dL — ABNORMAL HIGH (ref 8–23)
CO2: 23 mmol/L (ref 22–32)
Calcium: 9.7 mg/dL (ref 8.9–10.3)
Chloride: 103 mmol/L (ref 98–111)
Creatinine, Ser: 1.23 mg/dL — ABNORMAL HIGH (ref 0.44–1.00)
GFR, Estimated: 43 mL/min — ABNORMAL LOW (ref >60)
Glucose, Bld: 137 mg/dL — ABNORMAL HIGH (ref 70–99)
Potassium: 4.1 mmol/L (ref 3.5–5.1)
Sodium: 136 mmol/L (ref 135–145)
Total Bilirubin: 1.3 mg/dL — ABNORMAL HIGH (ref 0.3–1.2)
Total Protein: 6.9 g/dL (ref 6.5–8.1)

## 2022-05-29 LAB — CBC WITH DIFFERENTIAL/PLATELET
Abs Immature Granulocytes: 0.05 10*3/uL (ref 0.00–0.07)
Basophils Absolute: 0.1 10*3/uL (ref 0.0–0.1)
Basophils Relative: 0 %
Eosinophils Absolute: 0.1 10*3/uL (ref 0.0–0.5)
Eosinophils Relative: 1 %
HCT: 39.2 % (ref 36.0–46.0)
Hemoglobin: 13.3 g/dL (ref 12.0–15.0)
Immature Granulocytes: 0 %
Lymphocytes Relative: 12 %
Lymphs Abs: 1.5 10*3/uL (ref 0.7–4.0)
MCH: 29.9 pg (ref 26.0–34.0)
MCHC: 33.9 g/dL (ref 30.0–36.0)
MCV: 88.1 fL (ref 80.0–100.0)
Monocytes Absolute: 1.1 10*3/uL — ABNORMAL HIGH (ref 0.1–1.0)
Monocytes Relative: 9 %
Neutro Abs: 9.7 10*3/uL — ABNORMAL HIGH (ref 1.7–7.7)
Neutrophils Relative %: 78 %
Platelets: 224 10*3/uL (ref 150–400)
RBC: 4.45 MIL/uL (ref 3.87–5.11)
RDW: 13.6 % (ref 11.5–15.5)
WBC: 12.6 10*3/uL — ABNORMAL HIGH (ref 4.0–10.5)
nRBC: 0 % (ref 0.0–0.2)

## 2022-05-29 MED ORDER — ORAL CARE MOUTH RINSE
15.0000 mL | OROMUCOSAL | Status: DC | PRN
  Administered 2022-06-01: 15 mL via OROMUCOSAL

## 2022-05-29 MED ORDER — ENOXAPARIN SODIUM 30 MG/0.3ML IJ SOSY
30.0000 mg | PREFILLED_SYRINGE | INTRAMUSCULAR | Status: DC
  Administered 2022-05-30 – 2022-06-18 (×20): 30 mg via SUBCUTANEOUS
  Filled 2022-05-29 (×20): qty 0.3

## 2022-05-29 NOTE — Evaluation (Signed)
Occupational Therapy Assessment and Plan  Patient Details  Name: Katelyn Powers MRN: [id number] Date of Birth: September 29, 1932  OT Diagnosis: abnormal posture, apraxia, cognitive deficits, disturbance of vision, hemiplegia affecting non-dominant side, and muscle weakness (generalized) Rehab Potential:   ELOS: 3-3.5 weeks   Today's Date: 05/29/2022 OT Individual Time: 1791-5056 OT Individual Time Calculation (min): 51 min     Hospital Problem: Principal Problem:   Cerebrovascular accident (CVA) of right thalamus ([place])   Past Medical History:  Past Medical History:  Diagnosis Date   Cataract    Gout    Hyperlipidemia    Hypertension    Memory loss    NSTEMI (non-ST elevated myocardial infarction) ([place]) 08/2020   Right-sided carotid artery disease ([place]) 09/17/2020   carotid doppler wth >60% occlusion   Past Surgical History:  Past Surgical History:  Procedure Laterality Date   MELANOMA EXCISION Right 10/28/1970   level 2   SUPERFICIAL LYMPH NODE BIOPSY / EXCISION  10/28/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 female presents to [facility] hospital on 05/25/2022 with L weakness, imbalance. Pt found to have R thalamic infarct on MRI. PMH includes dementai, HTN, HLD, carotid stenosis, NSTEMI, CHF  Patient currently requires total with basic self-care skills secondary to muscle weakness, decreased cardiorespiratoy endurance, motor apraxia, decreased coordination, and decreased motor planning, decreased visual perceptual skills, decreased attention to left, decreased initiation, decreased attention, decreased awareness, decreased problem solving, decreased safety awareness, decreased memory, and delayed processing, and decreased sitting balance, decreased standing balance, decreased postural control, and decreased balance strategies.  Prior to hospitalization, patient could complete BADL with min.  Patient will benefit from skilled  intervention to decrease level of assist with basic self-care skills prior to discharge home with care partner.  Anticipate patient will require 24 hour supervision and minimal physical assistance and follow up home health.  OT - End of Session Activity Tolerance: Tolerates 30+ min activity with multiple rests Endurance Deficit: Yes OT Assessment Rehab Potential (ACUTE ONLY): Fair OT Barriers to Discharge: Incontinence OT Patient demonstrates impairments in the following area(s): Balance;Cognition;Endurance;Motor;Perception;Safety;Vision OT Basic ADL's Functional Problem(s): Grooming;Bathing;Dressing;Toileting;Eating OT Transfers Functional Problem(s): Toilet;Tub/Shower OT Additional Impairment(s): Fuctional Use of Upper Extremity OT Plan OT Intensity: Minimum of 1-2 x/day, 45 to 90 minutes OT Frequency: 5 out of 7 days OT Duration/Estimated Length of Stay: 3-3.5 weeks OT Treatment/Interventions: Balance/vestibular training;Discharge planning;Pain management;Self Care/advanced ADL retraining;UE/LE Coordination activities;Therapeutic Activities;Visual/perceptual remediation/compensation;Skin care/wound managment;Therapeutic Exercise;Patient/family education;Functional mobility training;Disease mangement/prevention;Cognitive remediation/compensation;Community reintegration;DME/adaptive equipment instruction;Neuromuscular re-education;Psychosocial support;Splinting/orthotics;UE/LE Strength taining/ROM;Wheelchair propulsion/positioning OT Self Feeding Anticipated Outcome(s): S OT Basic Self-Care Anticipated Outcome(s): MIN A OT Toileting Anticipated Outcome(s): MOD OT Bathroom Transfers Anticipated Outcome(s): MIN OT Recommendation Patient destination: Home Follow Up Recommendations: Home health OT Equipment Recommended: To be determined   OT Evaluation Precautions/Restrictions  Precautions Precautions: Fall;Other (comment) Precaution Comments: L inattention, hx of  Alzheimer's Restrictions Weight Bearing Restrictions: No General Chart Reviewed: Yes Family/Caregiver Present: No Vital Signs  Pain Pain Assessment Pain Scale: 0-10 Pain Score: 0-No pain Faces Pain Scale: No hurt Home Living/Prior Functioning Home Living Family/patient expects to be discharged to:: Private residence Available Help at Discharge: Family, Available 24 hours/day (2 daughters) Type of Home: House (details below about pt's house because current plan is for pt to D/C there with her daughter's alternating weeks to come stay with her for 24hr support) Home Access: Stairs to enter, Other (comment) (1 step-up and then a threshold to  enter) Entrance Stairs-Number of Steps: 1 Entrance Stairs-Rails: None Home Layout: One level Bathroom Shower/Tub: Sponge bathes at baseline [employer]: Standard Bathroom Accessibility: Yes Additional Comments: [name] family want AIR  Lives With: Other (Comment) (pt previously would alternate weeks at her 2 daughter's houses) Prior Function Level of Independence: Independent with gait, Independent with transfers, Other (comment) (pt was physically independent only requiring verbal cuing to remember to complete a task or to do it correctly due to her Alzheimer's - independent for bed mobility, transfers, and household gait w/o AD)  Able to Take Stairs?: Yes Driving: No Vision Baseline Vision/History:  (difficult to determine due to impaired attention but able to see items for session) Ability to See in Adequate Light: 0 Adequate Patient Visual Report: Peripheral vision impairment (L visual field cut vs inattention) Vision Assessment?: Vision impaired- to be further tested in functional context;Yes Eye Alignment: Impaired (comment) Ocular Range of Motion: Restricted on the left Alignment/Gaze Preference: Gaze right Tracking/Visual Pursuits: Left eye does not track laterally;Impaired - to be further tested in functional context Perception   Perception: Impaired Inattention/Neglect: Does not attend to left visual field Praxis Praxis: Impaired Praxis Impairment Details: Initiation;Motor planning Praxis-Other Comments: delayed processing and initiation Cognition Cognition Overall Cognitive Status: History of cognitive impairments - at baseline Arousal/Alertness: Awake/alert Memory: Impaired Attention: Sustained Sustained Attention: Impaired Awareness: Impaired Problem Solving: Impaired Safety/Judgment: Impaired Brief Interview for Mental Status (BIMS) Repetition of Three Words (First Attempt): None Temporal Orientation: Year: Nonsensical Temporal Orientation: Month: Nonsensical Temporal Orientation: Day: Nonsensical Recall: "Sock": No, could not recall Recall: "Blue": No, could not recall Recall: "Bed": No, could not recall BIMS Summary Score: 0 Sensation Sensation Light Touch: Appears Intact Coordination Gross Motor Movements are Fluid and Coordinated: Yes Fine Motor Movements are Fluid and Coordinated: No Finger Nose Finger Test: unable to attend to test; able to oppose digits and able to grab wash cloth wiht LUE Motor  Motor Motor: Hemiplegia;Abnormal postural alignment and control;Motor apraxia Motor - Skilled Clinical Observations: L hemi- mild Motor - Discharge Observations: ? cognitive/attention deficits impacting movement v true motor planing/praxis issues  Trunk/Postural Assessment  Cervical Assessment Cervical Assessment:  (head forward) Thoracic Assessment Thoracic Assessment:  (rounded shoudlers) Lumbar Assessment Lumbar Assessment:  (post pelvic tilt) Postural Control Postural Control: Deficits on evaluation  Balance Balance Balance Assessed: Yes Static Sitting Balance Static Sitting - Level of Assistance: 5: Stand by assistance Dynamic Sitting Balance Dynamic Sitting - Level of Assistance: 4: Min assist Static Standing Balance Static Standing - Level of Assistance: 2: Max assist;3: Mod  assist Extremity/Trunk Assessment RUE Assessment RUE Assessment: Within Functional Limits LUE Assessment LUE Assessment: Exceptions to [facility] General Strength Comments: mild weakness compaired to R LUE Body System: Neuro Brunstrum levels for arm and hand: Arm;Hand Brunstrum level for arm: Stage V Relative Independence from Synergy Brunstrum level for hand: Stage V Independence from basic synergies  Care Tool Care Tool Self Care Eating        Oral Care         Bathing   Body parts bathed by patient: Chest;Abdomen Body parts bathed by helper: Right arm;Left arm;Front perineal area;Buttocks;Right upper leg;Left upper leg;Right lower leg;Left lower leg;Face   Assist Level: Total Assistance - Patient < 25%    Upper Body Dressing(including orthotics)   What is the patient wearing?: Button up shirt   Assist Level: Total Assistance - Patient < 25%    Lower Body Dressing (excluding footwear)   What is the patient wearing?: Pants;Incontinence brief  Assist for lower body dressing: 2 Helpers    Putting on/Taking off footwear   What is the patient wearing?: Non-skid slipper socks Assist for footwear: Dependent - Patient 0%       Care Tool Toileting Toileting activity   Assist for toileting: 2 Helpers     Care Tool Bed Mobility Roll left and right activity        Sit to lying activity        Lying to sitting on side of bed activity         Care Tool Transfers Sit to stand transfer   Sit to stand assist level: Maximal Assistance - Patient 25 - 49%    Chair/bed transfer   Chair/bed transfer assist level: Maximal Assistance - Patient 25 - 49%     Toilet transfer   Assist Level: Maximal Assistance - Patient 24 - 49%     Care Tool Cognition  Expression of Ideas and Wants Expression of Ideas and Wants: 2. Frequent difficulty - frequently exhibits difficulty with expressing needs and ideas  Understanding Verbal and Non-Verbal Content Understanding Verbal and Non-Verbal  Content: 2. Sometimes understands - understands only basic conversations or simple, direct phrases. Frequently requires cues to understand   Memory/Recall Ability     Refer to Care Plan for Long Term Goals  SHORT TERM GOAL WEEK 1 OT Short Term Goal 1 (Week 1): Pt will trasnfer to toilet with MOD A and LRAD OT Short Term Goal 2 (Week 1): Pt will locate 3/5 neded ADL items on L of center to demo improved L attention OT Short Term Goal 3 (Week 1): Pt will initate 1/4 steps of UB dressing OT Short Term Goal 4 (Week 1): Pt will complete oral care with MOD cuing for sequencing  Recommendations for other services: None    Skilled Therapeutic Intervention 1:1. Pt, daughter and granddaughter present for OT session with education on OT role/purpose, CIR, ELOS and POC. Pt completes BADL at sink level seated as listed below. At baseline pt set up for dressing, MOD A for bathing and supervision-MOD A for toileting. Pt demo signifianclty impaired attention, initiation L neglect, memory, delayed recall and poor motor planning impacting BADL performance. Pt demo ability to move all 4 limbs but often requires max cuing to locate items on L visual field with head turns. Pt overall requires MAX for all mobility to/from w/c and STS at sink for daughter to switch w/c for [facility] as pt begins voiding bowel in standing after attempting to advance pants past hips. Exited session with pt seated in w/c with daughter present supervising finishing up at sink applying makeup, exit alarm on and call light in reach   ADL ADL Eating: Moderate assistance Grooming: Maximal assistance Where Assessed-Grooming: Wheelchair Upper Body Bathing: Maximal assistance Where Assessed-Upper Body Bathing: Wheelchair Lower Body Bathing: Maximal assistance Where Assessed-Lower Body Bathing: Wheelchair Upper Body Dressing: Maximal assistance Where Assessed-Upper Body Dressing: Wheelchair Lower Body Dressing: Dependent Where Assessed-Lower  Body Dressing: Wheelchair Toileting: Dependent Where Assessed-Toileting: Glass blower/designer: Maximal Print production planner Method: Stand pivot Mobility  Bed Mobility Bed Mobility: Supine to Sit;Sit to Supine Supine to Sit: Moderate Assistance - Patient 50-74%;Maximal Assistance - Patient - Patient 25-49% Sit to Supine: Moderate Assistance - Patient 50-74% Transfers Sit to Stand: Maximal Assistance - Patient 25-49% Stand to Sit: Maximal Assistance - Patient 25-49%   Discharge Criteria: Patient will be discharged from OT if patient refuses treatment 3 consecutive times without medical reason, if treatment  goals not met, if there is a change in medical status, if patient makes no progress towards goals or if patient is discharged from hospital.  The above assessment, treatment plan, treatment alternatives and goals were discussed and mutually agreed upon: by patient  Tonny Branch 05/29/2022, 12:52 PM

## 2022-05-29 NOTE — Progress Notes (Signed)
Physical Therapy Session Note  Patient Details  Name: Katelyn Powers MRN: [id number] Date of Birth: 28-Mar-1932  Today's Date: 05/29/2022 PT Individual Time: 1520-1630 PT Individual Time Calculation (min): 70 min   Short Term Goals: Week 1:  PT Short Term Goal 1 (Week 1): Pt will perform supine<>sit with mod assist using bed features PT Short Term Goal 2 (Week 1): Pt will perform sit<>stand transfers using LRAD with max assist of 1 PT Short Term Goal 3 (Week 1): Pt will perform bed<>chair transfers using LRAD with mod assist without lift equipment PT Short Term Goal 4 (Week 1): Pt will ambulate at least 29f using LRAD with +2 mod assist Week 2:    Week 3:     Skilled Therapeutic Interventions/Progress Updates:   Pt received supine in bed, asleep. Pt aroused with effort from PT with extreme difficulty keep eyes open throughout session. Supine>sit transfer with total assist and for initiation of movement and sequencing.  [place] with supervision assist while PT positioning lift equipment. Max cues for initiation of stand in lift and use of RUE  Pt transported to rehab gym. SClarise Cruzplus transfer to mat table with max cues for use of BUE. Noted to have improved use of RUE in [facility]   Function In Sitting Test (FIST)  (1/2 femur on surface; hips/knees flexed to 90deg)   TOTAL = 28/56 Notes/comments: pt with extreme difficulty initiating some movements due to perseverative movement patterns to rub and scratch legs throughout session.  MCD > 5 points; MCID for IP REHAB > 6 points See below for details.   Pt returned to room and performed sara plus transfer to bed. Sit>supine completed with max assist, but pt able to initiate movement into supine position. Pt left supine in bed with call bell in reach and all needs met.         Therapy Documentation Precautions:  Precautions Precautions: Fall, Other  (comment) Precaution Comments: L inattention, hx of Alzheimer's Restrictions Weight Bearing Restrictions: No    Vital Signs: Therapy Vitals Temp: 98.5 F (36.9 C) Pulse Rate: 71 Resp: 16 BP: 127/77 Patient Position (if appropriate): Sitting Oxygen Therapy SpO2: 97 % O2 Device: Room Air Pain: Pain Assessment Pain Scale: 0-10 Pain Score: 0-No pain  Trunk/Postural Assessment : Cervical Assessment Cervical Assessment: Ex[facility] to [facility] head) Thoracic Assessment Thoracic Assessment: Exceptions to [facility](thoracic rounding with protracted shoulders) Lumbar Assessment Lumbar Assessment: [facility] to [facility](posterior pelvic tilt in sitting) Postural Control Postural Control: Deficits on evaluation Trunk Control: delayed and insufficient for dynamic tasks Postural Limitations: decreased  Balance: Balance Balance Assessed: Yes Standardized Balance Assessment Standardized Balance Assessment: FIST Function in Sitting Test = FIST Anterior Nudge: superior sternum: Verbal cues/increased time Posterior Nudge: between scapular spines: Upper extremity support Lateral Nudge: to dominant side at acromion: Upper extremity support Static Sitting: 30 seconds: Independent Sitting, Shake "No": left and right: Upper extremity support (max cues to initiate movement) Sitting, Eyes Closed: 30 seconds: Independent Sitting, Lift Foot: dominant side, lift foot 1 inch twice: Verbal cues/increased time Pick Up Object From Behind: object at midline, hands breadth posterior: Needs assistance (unable to follow instruction to complete reach) Pick Up Object From Behind Assistance: Mod Assist Forward Reach: use dominant arm, must complete full motion: Verbal cues/increased time Lateral Reach: use dominant arm, clear opposite ischial tuberosity: Needs assistance Lateral Reach Assistance: Mod Assist Pick Up Object From Floor:  from between feet: Dependent Posterior Scooting: move backwards 2 inches:  Needs assistance Posterior Scooting Assistance: Max Assist Anterior Scooting: move forward 2 inches: Needs assistance Anterior Scooting Assistance: Max Assist Lateral Scooting: move to dominent side 2 inches: Needs assistance Lateral Scooting Assistance: Max Assist FIST TOTAL SCORE: 28 Static Sitting Balance Static Sitting - Balance Support: Feet supported;Right upper extremity supported Static Sitting - Level of Assistance: 5: Stand by assistance;Other (comment) (CGA) Dynamic Sitting Balance Dynamic Sitting - Balance Support: Feet supported;Right upper extremity supported Dynamic Sitting - Level of Assistance: 4: Min assist;3: Mod assist Static Standing Balance Static Standing - Balance Support: Bilateral upper extremity supported Static Standing - Level of Assistance: Other (comment);3: Mod assist (+2 mod assist) Dynamic Standing Balance Dynamic Standing - Balance Support: During functional activity;Bilateral upper extremity supported Dynamic Standing - Level of Assistance: Other (comment) (+2 mod/max assist)    Therapy/Group: Individual Therapy  Lorie Phenix 05/29/2022, 4:45 PM

## 2022-05-29 NOTE — Progress Notes (Signed)
PROGRESS NOTE   Subjective/Complaints:  No issues overnite, pt alert in bed, daughter and grandaughter at bedside   ROS- unable to obtain due to cognitive deficits  Objective:   ECHOCARDIOGRAM COMPLETE BUBBLE STUDY  Result Date: 05/27/2022    ECHOCARDIOGRAM REPORT   Patient Name:   Katelyn Powers Date of Exam: 05/27/2022 Medical Rec #:  [id number]   Height:       62.5 in Accession #:    [id number]  Weight:       158.3 lb Date of Birth:  1932-05-07  BSA:          1.741 m Patient Age:    88 years    BP:           132/79 mmHg Patient Gender: F           HR:           77 bpm. Exam Location:  Inpatient Procedure: 2D Echo, Cardiac Doppler, Color Doppler and Saline Contrast Bubble            Study Indications:    Stroke 434.91 / I63.9  History:        Patient has prior history of Echocardiogram examinations, most                 recent 09/26/2021. Previous Myocardial Infarction, Carotid                 Disease; Risk Factors:Hypertension and Dyslipidemia.  Sonographer:    Darlina Sicilian RDCS Referring Phys: [id number] [place]  1. Left ventricular ejection fraction by 3D volume is 54 %. The left ventricle has low normal function. The left ventricle has no regional wall motion abnormalities. Left ventricular diastolic parameters are consistent with Grade I diastolic dysfunction  (impaired relaxation). Elevated left atrial pressure.  2. Right ventricular systolic function is normal. The right ventricular size is normal. There is normal pulmonary artery systolic pressure.  3. The mitral valve is degenerative. No evidence of mitral valve regurgitation. Mild mitral stenosis. The mean mitral valve gradient is 4.0 mmHg.  4. DI 0.46. The aortic valve is tricuspid. There is mild calcification of the aortic valve. There is mild thickening of the aortic valve. Aortic valve regurgitation is trivial. Mild to moderate aortic valve stenosis. Aortic valve  area, by VTI measures 1.17 cm. Aortic valve mean gradient measures 12.0 mmHg. Aortic valve Vmax measures 2.21 m/s.  5. The inferior vena cava is normal in size with greater than 50% respiratory variability, suggesting right atrial pressure of 3 mmHg. Conclusion(s)/Recommendation(s): No intracardiac source of embolism detected on this transthoracic study. Consider a transesophageal echocardiogram to exclude cardiac source of embolism if clinically indicated. FINDINGS  Left Ventricle: Left ventricular ejection fraction by 3D volume is 54 %. The left ventricle has low normal function. The left ventricle has no regional wall motion abnormalities. The left ventricular internal cavity size was normal in size. There is no left ventricular hypertrophy. Left ventricular diastolic parameters are consistent with Grade I diastolic dysfunction (impaired relaxation). Elevated left atrial pressure. Right Ventricle: The right ventricular size is normal. No increase in right ventricular wall thickness. Right ventricular systolic function  is normal. There is normal pulmonary artery systolic pressure. The tricuspid regurgitant velocity is 2.05 m/s, and  with an assumed right atrial pressure of 3 mmHg, the estimated right ventricular systolic pressure is 31.4 mmHg. Left Atrium: Left atrial size was normal in size. Right Atrium: Right atrial size was normal in size. Pericardium: There is no evidence of pericardial effusion. Mitral Valve: The mitral valve is degenerative in appearance. There is mild thickening of the mitral valve leaflet(s). There is mild calcification of the mitral valve leaflet(s). No evidence of mitral valve regurgitation. Mild mitral valve stenosis. MV peak gradient, 9.9 mmHg. The mean mitral valve gradient is 4.0 mmHg. Tricuspid Valve: The tricuspid valve is normal in structure. Tricuspid valve regurgitation is mild . No evidence of tricuspid stenosis. Aortic Valve: DI 0.46. The aortic valve is tricuspid. There is  mild calcification of the aortic valve. There is mild thickening of the aortic valve. Aortic valve regurgitation is trivial. Aortic regurgitation PHT measures 439 msec. Mild to moderate aortic stenosis is present. Aortic valve mean gradient measures 12.0 mmHg. Aortic valve peak gradient measures 19.5 mmHg. Aortic valve area, by VTI measures 1.17 cm. Pulmonic Valve: The pulmonic valve was normal in structure. Pulmonic valve regurgitation is not visualized. No evidence of pulmonic stenosis. Aorta: The aortic root is normal in size and structure. Venous: The inferior vena cava is normal in size with greater than 50% respiratory variability, suggesting right atrial pressure of 3 mmHg. IAS/Shunts: No atrial level shunt detected by color flow Doppler. Agitated saline contrast was given intravenously to evaluate for intracardiac shunting.  LEFT VENTRICLE PLAX 2D LVIDd:         4.20 cm         Diastology LVIDs:         3.00 cm         LV e' medial:    2.34 cm/s LV PW:         0.70 cm         LV E/e' medial:  30.3 LV IVS:        0.80 cm         LV e' lateral:   3.57 cm/s LVOT diam:     1.90 cm         LV E/e' lateral: 19.9 LV SV:         52 LV SV Index:   30 LVOT Area:     2.84 cm        3D Volume EF                                LV 3D EF:    Left                                             ventricul                                             ar  ejection                                             fraction                                             by 3D                                             volume is                                             54 %.                                 3D Volume EF:                                3D EF:        54 %                                LV EDV:       97 ml                                LV ESV:       44 ml                                LV SV:        53 ml RIGHT VENTRICLE RV S prime:     19.40 cm/s TAPSE (M-mode): 2.0 cm LEFT ATRIUM              Index        RIGHT ATRIUM          Index LA diam:        3.20 cm 1.84 cm/m   RA Area:     8.17 cm LA Vol (A2C):   31.6 ml 18.15 ml/m  RA Volume:   14.50 ml 8.33 ml/m LA Vol (A4C):   33.0 ml 18.95 ml/m LA Biplane Vol: 33.4 ml 19.18 ml/m  AORTIC VALVE AV Area (Vmax):    1.33 cm AV Area (Vmean):   1.12 cm AV Area (VTI):     1.17 cm AV Vmax:           221.00 cm/s AV Vmean:          162.667 cm/s AV VTI:            0.441 m AV Peak Grad:      19.5 mmHg AV Mean Grad:      12.0 mmHg LVOT Vmax:         104.00 cm/s LVOT Vmean:  64.200 cm/s LVOT VTI:          0.182 m LVOT/AV VTI ratio: 0.41 AI PHT:            439 msec  AORTA Ao Root diam: 3.10 cm Ao Asc diam:  3.30 cm MITRAL VALVE                TRICUSPID VALVE MV Area (PHT): 2.01 cm     TR Peak grad:   16.8 mmHg MV Area VTI:   1.49 cm     TR Vmax:        205.00 cm/s MV Peak grad:  9.9 mmHg MV Mean grad:  4.0 mmHg     SHUNTS MV Vmax:       1.57 m/s     Systemic VTI:  0.18 m MV Vmean:      87.0 cm/s    Systemic Diam: 1.90 cm MV Decel Time: 377 msec MV E velocity: 71.00 cm/s MV A velocity: 129.00 cm/s MV E/A ratio:  0.55 Katelyn Powers Electronically signed by Katelyn Powers Signature Date/Time: 05/27/2022/5:21:57 PM    Final    Recent Labs    05/29/22 0727  WBC 12.6*  HGB 13.3  HCT 39.2  PLT 224   No results for input(s): "NA", "K", "CL", "CO2", "GLUCOSE", "BUN", "CREATININE", "CALCIUM" in the last 72 hours.  Intake/Output Summary (Last 24 hours) at 05/29/2022 0855 Last data filed at 05/29/2022 0758 Gross per 24 hour  Intake 240 ml  Output --  Net 240 ml        Physical Exam: Vital Signs Blood pressure 131/64, pulse 69, temperature (!) 97.1 F (36.2 C), temperature source Axillary, resp. rate 18, height '5\' 2"'$  (1.575 m), weight 68.3 kg, SpO2 96 %.   General: No acute distress Mood and affect are appropriate Heart: Regular rate and rhythm no rubs murmurs or extra sounds Lungs: Clear to auscultation, breathing unlabored, no  rales or wheezes Abdomen: Positive bowel sounds, soft nontender to palpation, nondistended Extremities: No clubbing, cyanosis, or edema Skin: No evidence of breakdown, no evidence of rash Neurologic: oriented to self only, motor strength is 5/5 in bilateral deltoid, bicep, tricep, grip, hip flexor, knee extensors, ankle dorsiflexor and plantar flexor Left neglect evident.  Cannot assess sensation due to cog def Musculoskeletal: Full range of motion in all 4 extremities. No joint swelling   Assessment/Plan: 1. Functional deficits which require 3+ hours per day of interdisciplinary therapy in a comprehensive inpatient rehab setting. Physiatrist is providing close team supervision and 24 hour management of active medical problems listed below. Physiatrist and rehab team continue to assess barriers to discharge/monitor patient progress toward functional and medical goals  Care Tool:  Bathing              Bathing assist       Upper Body Dressing/Undressing Upper body dressing        Upper body assist      Lower Body Dressing/Undressing Lower body dressing            Lower body assist       Toileting Toileting    Toileting assist       Transfers Chair/bed transfer  Transfers assist           Locomotion Ambulation   Ambulation assist              Walk 10 feet activity   Assist           Walk  50 feet activity   Assist           Walk 150 feet activity   Assist           Walk 10 feet on uneven surface  activity   Assist           Wheelchair     Assist               Wheelchair 50 feet with 2 turns activity    Assist            Wheelchair 150 feet activity     Assist          Blood pressure 131/64, pulse 69, temperature (!) 97.1 F (36.2 C), temperature source Axillary, resp. rate 18, height '5\' 2"'$  (1.575 m), weight 68.3 kg, SpO2 96 %. Medical Problem List and Plan: 1. Functional  deficits secondary to right thalamic infarct PCA distribution ; Alzheimner's dementia and prior stroke/TIA with left arm weakness and left hemianopsia             -patient may shower             -ELOS/Goals: 14-21 days, supervision to min assist goals, team conf today  2.  Antithrombotics: -DVT/anticoagulation:  Pharmaceutical: Lovenox             -antiplatelet therapy: Plavix and aspirin 81 mg (family declines Brilinta) 3. Pain Management: Tylenol as needed 4. Mood/Behavior/Sleep: LCSW to evaluate and provide emotional support             -antipsychotic agents: n/a 5. Neuropsych/cognition: This patient is not capable of making decisions on her own behalf. 6. Skin/Wound Care: Routine skin care checks 7. Fluids/Electrolytes/Nutrition: Routine I's and O's and follow-up chemistries             -heart healthy diet/thin liq; crush meds in pureed 8: Hypertension: continue amlodipine 5 mg daily             -Cozaar 50 mg daily is being held Vitals:   05/28/22 1945 05/29/22 0613  BP: 128/74 131/64  Pulse: 97 69  Resp: 18 18  Temp: 97.8 F (36.6 C) (!) 97.1 F (36.2 C)  SpO2: 94% 96%    9: Hyperlipidemia: continue Zetia, Pravachol 10: CAD: stable, no CP 11: Alzheimer's dementia; follows with Dr. Felecia Shelling. Does not tolerate donepezil 12: Diastolic CHF: stable, no SOB or edema 13: Insomnia: continue melatonin>>need to give by 7-7:30 pm nightly- family requests to not awaken pt for turning at night  14: Left nipple skin change: no sign of infection; follow-up as outpatient 15: Urinary incontinence:              -attempt timed voids             -check PVR's    LOS: 1 days A FACE TO FACE EVALUATION WAS PERFORMED  Charlett Blake 05/29/2022, 8:55 AM

## 2022-05-29 NOTE — Evaluation (Signed)
Physical Therapy Assessment and Plan  Patient Details  Name: Katelyn Powers MRN: [id number] Date of Birth: [birth year]  PT Diagnosis: Abnormal posture, Abnormality of gait, Cognitive deficits, Difficulty walking, Hemiparesis non-dominant, Impaired cognition, and Muscle weakness Rehab Potential: Fair ELOS: ~3-3.5 weeks   Today's Date: 05/29/2022 PT Individual Time: 1100-1200 PT Individual Time Calculation (min): 60 min    Hospital Problem: Principal Problem:   Cerebrovascular accident (CVA) of right thalamus ([place])   Past Medical History:  Past Medical History:  Diagnosis Date   Cataract    Gout    Hyperlipidemia    Hypertension    Memory loss    NSTEMI (non-ST elevated myocardial infarction) ([place]) 08/2020   Right-sided carotid artery disease ([place]) 09/17/2020   carotid doppler wth >60% occlusion   Past Surgical History:  Past Surgical History:  Procedure Laterality Date   MELANOMA EXCISION Right 10/28/1970   level 2   SUPERFICIAL LYMPH NODE BIOPSY / EXCISION  10/28/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 year old female with a history of Alzheimer's dementia who developed acute onset of generalized weakness, left vision deficit and strong smelling urine on 05/25/2022. EMS was called and she presented to the [facility] ED with left sided weakness.  Began work-up for UT. Family remained concerned for possible stroke and code stroke was called. [name] was consulted. The patient has had her baseline cognition for approximately 2 years and was able to feed and dress herself prior to admission.  She has 24 hour assistance from family.    Neurology discussed changing therapy to Brilinta, however this was deferred by the family citing previous intolerance of this medication notably increased confusion.  SLP evaluation obtained and the patient is currently tolerating a heart healthy diet with medications crushed and  administered in applesauce.  She has a PureWick in place for urinary incontinence and urinalysis on 7/30 revealed straw-colored urine, protein 100 mg/dL and rare bacteria.   Medical history is significant for prior stoke December, 2022 with tenecteplase administration with excellent outcome. Residual expressive aphasia and decline in memory. She is followed by [name]. Discussed stenting of right ICA due to 60-79% stenosis on 01/14/2022. This was deferred. She is maintained at home on Plavix and aspirin. History of melanoma.   The patient requires inpatient physical medicine and rehabilitation evaluations and treatment secondary to dysfunction due to right thalamic infarct.Patient transferred to CIR on 05/28/2022 .   Patient currently requires  +2 max assist  with mobility secondary to muscle weakness, decreased cardiorespiratoy endurance, impaired timing and sequencing, unbalanced muscle activation, and decreased motor planning, decreased visual perceptual skills, decreased visual motor skills, and field cut, decreased attention to left and decreased motor planning, decreased initiation, decreased attention, decreased awareness, decreased problem solving, decreased safety awareness, decreased memory, and delayed processing, and decreased sitting balance, decreased standing balance, decreased postural control, and decreased balance strategies.  Prior to hospitalization, patient was independent  with mobility and lived with Other (Comment) (pt previously would alternate weeks at her 2 daughter's houses) in a House (details below about pt's house because current plan is for pt to D/C there with her daughter's alternating weeks to come stay with her for 24hr support) home.  Home access is  Stairs to enter, Other (comment) (1 step-up and then a threshold to enter).  Patient will benefit from skilled PT intervention to maximize safe functional mobility, minimize  fall risk, and decrease caregiver burden for  planned discharge home with 24 hour assist.  Anticipate patient will benefit from follow up [place] at discharge.  PT - End of Session Activity Tolerance: Tolerates 30+ min activity with multiple rests Endurance Deficit: Yes Endurance Deficit Description: pt quickly falling asleep during session requiring sustained stimulus to maintain alertness PT Assessment Rehab Potential (ACUTE/IP ONLY): Fair PT Barriers to Discharge: [place] home environment;Decreased caregiver support;Home environment access/layout;Incontinence PT Patient demonstrates impairments in the following area(s): Balance;Motor;Safety;Behavior;Nutrition;Sensory;Edema;Pain;Skin Integrity;Endurance;Perception PT Transfers Functional Problem(s): Bed Mobility;Bed to Chair;Car;Furniture PT Locomotion Functional Problem(s): Ambulation;Wheelchair Mobility;Stairs PT Plan PT Intensity: Minimum of 1-2 x/day ,45 to 90 minutes PT Frequency: 5 out of 7 days PT Duration Estimated Length of Stay: ~3-3.5 weeks PT Treatment/Interventions: Ambulation/gait training;Cognitive remediation/compensation;Discharge planning;DME/adaptive equipment instruction;Functional mobility training;Pain management;Psychosocial support;Splinting/orthotics;Therapeutic Activities;Visual/perceptual remediation/compensation;UE/LE Strength taining/ROM;Balance/vestibular training;Community reintegration;Disease management/prevention;Functional electrical stimulation;Neuromuscular re-education;Patient/family education;Skin care/wound management;Stair training;Therapeutic Exercise;UE/LE Coordination activities;Wheelchair propulsion/positioning PT Transfers Anticipated Outcome(s): min assist using LRAD PT Locomotion Anticipated Outcome(s): min assist using LRAD PT Recommendation Follow Up Recommendations: Home health PT;24 hour supervision/assistance Patient destination: Home Equipment Recommended: To be determined   PT  Evaluation Precautions/Restrictions Precautions Precautions: Fall;Other (comment) Precaution Comments: L inattention, hx of Alzheimer's Restrictions Weight Bearing Restrictions: No Pain Pain Assessment Pain Scale: 0-10 Pain Score: 0-No pain Faces Pain Scale: No hurt Denies pain during session but appears to be sensitive to touch Pain Interference Pain Interference Pain Effect on Sleep: 8. Unable to answer (pt unable to understand questions) Pain Interference with Therapy Activities: 8. Unable to answer Pain Interference with Day-to-Day Activities: 8. Unable to answer Home Living/Prior Functioning Home Living Available Help at Discharge: Family;Available 24 hours/day (2 daughters) Type of Home: House (details below about pt's house because current plan is for pt to D/C there with her daughter's alternating weeks to come stay with her for 24hr support) Home Access: Stairs to enter;Other (comment) (1 step-up and then a threshold to enter) Entrance Stairs-Number of Steps: 1 Entrance Stairs-Rails: None Home Layout: One level Bathroom Shower/Tub: Sponge bathes at baseline Bathroom Toilet: Standard Bathroom Accessibility: Yes Additional Comments: [name] family want AIR  Lives With: Other (Comment) (pt previously would alternate weeks at her 2 daughter's houses) Prior Function Level of Independence: Independent with gait;Independent with transfers;Other (comment) (pt was physically independent only requiring verbal cuing to remember to complete a task or to do it correctly due to her Alzheimer's - independent for bed mobility, transfers, and household gait w/o AD)  Able to Take Stairs?: Yes Driving: No Vision/Perception  Vision - History Ability to See in Adequate Light: 0 Adequate Vision - Assessment Eye Alignment: Impaired (comment) Ocular Range of Motion: Restricted on the left Alignment/Gaze Preference: Gaze right Tracking/Visual Pursuits: Left eye does not track  laterally;Impaired - to be further tested in functional context Perception Perception: Impaired Inattention/Neglect: Does not attend to left visual field Praxis Praxis: Impaired Praxis Impairment Details: Initiation;Motor planning Praxis-Other Comments: delayed processing and initiation  Cognition Overall Cognitive Status: History of cognitive impairments - at baseline Arousal/Alertness: Awake/alert (lethargic at beginning quickly falling back to sleep but improved with upright, OOB mobility) Orientation Level: Oriented to person Attention: Focused;Sustained;Selective Focused Attention: Impaired Sustained Attention: Impaired Selective Attention: Impaired Memory: Impaired Awareness: Impaired Problem Solving: Impaired Safety/Judgment: Impaired Sensation Sensation Light Touch:  (responds to touch on both LEs but unable to participate in formal assessment due to impaired cognition) Hot/Cold: Not tested Proprioception: Not tested Stereognosis: Not tested Coordination Gross Motor Movements are Fluid and Coordinated: No Fine  Motor Movements are Fluid and Coordinated: No Coordination and Movement Description: impaired due to generalized weakness and deconditioning with delayed initiation of movements and impaired balance Finger Nose Finger Test: unable to attend to test; able to oppose digits and able to grab wash cloth wiht LUE Motor  Motor Motor: Hemiplegia;Abnormal postural alignment and control Motor - Skilled Clinical Observations: very mild L hemiparesis, generalized weakness and deconditioning with delayed initiation of movements and impaired balance  Trunk/Postural Assessment  Cervical Assessment Cervical Assessment: Exceptions to [facility] (forward head) Thoracic Assessment Thoracic Assessment: Exceptions to [facility] (thoracic rounding with protracted shoulders) Lumbar Assessment Lumbar Assessment: Exceptions to [facility] (posterior pelvic tilt in sitting) Postural Control Postural  Control: Deficits on evaluation Trunk Control: delayed and insufficient for dynamic tasks Postural Limitations: decreased  Balance Balance Balance Assessed: Yes Static Sitting Balance Static Sitting - Balance Support: Feet supported;Right upper extremity supported Static Sitting - Level of Assistance: 5: Stand by assistance;Other (comment) (CGA) Dynamic Sitting Balance Dynamic Sitting - Balance Support: Feet supported;Right upper extremity supported Dynamic Sitting - Level of Assistance: 4: Min assist;3: Mod assist Static Standing Balance Static Standing - Balance Support: Bilateral upper extremity supported Static Standing - Level of Assistance: Other (comment);3: Mod assist (+2 mod assist) Dynamic Standing Balance Dynamic Standing - Balance Support: During functional activity;Bilateral upper extremity supported Dynamic Standing - Level of Assistance: Other (comment) (+2 mod/max assist) Extremity Assessment      RLE Assessment RLE Assessment: Exceptions to [facility] General Strength Comments: unable to participate in formal assessment due to impaired cognition - demonstrates grossly >3+/5 throughout during functional mobility LLE Assessment LLE Assessment: Exceptions to [facility] General Strength Comments: unable to participate in formal assessment due to impaired cognition - demonstrates grossly >3+/5 throughout during functional mobility  Care Tool Care Tool Bed Mobility Roll left and right activity   Roll left and right assist level: Maximal Assistance - Patient 25 - 49%    Sit to lying activity   Sit to lying assist level: Maximal Assistance - Patient 25 - 49%    Lying to sitting on side of bed activity   Lying to sitting on side of bed assist level: the ability to move from lying on the back to sitting on the side of the bed with no back support.: Maximal Assistance - Patient 25 - 49%     Care Tool Transfers Sit to stand transfer   Sit to stand assist level: 2 Helpers     Chair/bed transfer   Chair/bed transfer assist level: 2 Helpers     Toilet transfer   Assist Level: Dependent - Patient 0% (stedy)    Scientist, product/process development transfer activity did not occur: Safety/medical concerns        Care Tool Locomotion Ambulation   Assist level: 2 helpers Assistive device: Other (comment) (3 Musketeer) Max distance: 51f  Walk 10 feet activity   Assist level: 2 helpers Assistive device: Other (comment) (3 Musketeer)   Walk 50 feet with 2 turns activity Walk 50 feet with 2 turns activity did not occur: Safety/medical concerns      Walk 150 feet activity Walk 150 feet activity did not occur: Safety/medical concerns      Walk 10 feet on uneven surfaces activity Walk 10 feet on uneven surfaces activity did not occur: Safety/medical concerns      Stairs Stair activity did not occur: Safety/medical concerns        Walk up/down 1 step activity Walk up/down 1 step or curb (drop down)  activity did not occur: Safety/medical concerns      Walk up/down 4 steps activity Walk up/down 4 steps activity did not occur: Safety/medical concerns      Walk up/down 12 steps activity Walk up/down 12 steps activity did not occur: Safety/medical concerns      Pick up small objects from floor   Pick up small object from the floor assist level: 2 helpers (requires +2 assist to maintain standing)    Wheelchair Is the patient using a wheelchair?: Yes Type of Wheelchair: Manual   Wheelchair assist level: Dependent - Patient 0%    Wheel 50 feet with 2 turns activity   Assist Level: Dependent - Patient 0%  Wheel 150 feet activity   Assist Level: Dependent - Patient 0%    Refer to Care Plan for Long Term Goals  SHORT TERM GOAL WEEK 1 PT Short Term Goal 1 (Week 1): Pt will perform supine<>sit with mod assist using bed features PT Short Term Goal 2 (Week 1): Pt will perform sit<>stand transfers using LRAD with max assist of 1 PT Short Term Goal 3 (Week 1): Pt will perform  bed<>chair transfers using LRAD with mod assist without lift equipment PT Short Term Goal 4 (Week 1): Pt will ambulate at least 37f using LRAD with +2 mod assist  Recommendations for other services: None   Skilled Therapeutic Intervention Pt received supine, asleep in the bed with her daughter, TCloyd Stagers present and pt appearing agreeable to therapy session reporting need to use bathroom. Evaluation completed (see details above) with patient education regarding purpose of PT evaluation, PT POC and goals, therapy schedule, weekly team meetings, and other CIR information including safety plan and fall risk safety. Throughout session pt requires increased time to process and then initiate mobility tasks when cued - pt with some resistance to wanting to get OOB despite need to use bathroom. Performed the below functional mobility tasks with the specified levels of skilled assistance and cuing. Pt benefits from short, simple cuing to initiate tasks. At end of session, pt left supine in bed with needs in reach, bed alarm on, and her daughter present.   Mobility Bed Mobility Bed Mobility: Supine to Sit;Sit to Supine Supine to Sit: Moderate Assistance - Patient 50-74%;Maximal Assistance - Patient - Patient 25-49% (using bed features) Sit to Supine: Moderate Assistance - Patient 50-74% (using bed features) Transfers Transfers: Sit to Stand;Stand to Sit;Squat Pivot Transfers Sit to Stand: 2 Helpers;Dependent - mechanical lift (+2 mod assist vs stedy) Stand to Sit: 2 Helpers;Dependent - mechanical lift (+2 mod assist vs stedy) Squat Pivot Transfers: Maximal Assistance - Patient 25-49% Transfer (Assistive device): Other (Comment) (stedy) Locomotion  Gait Ambulation: Yes Gait Assistance: 2 Helpers (+2 mod assist) Gait Distance (Feet): 23 Feet Assistive device: Other (Comment) (3 Musketeer support) Gait Gait: Yes Gait Pattern: Impaired Gait Pattern: Poor foot clearance - left;Poor foot clearance -  right;Narrow base of support;Step-to pattern;Decreased step length - left;Decreased step length - right;Decreased stride length;Trunk flexed (forward crouched posture with excessive hip flexion, L LE scissoring, narrow BOS with small step lengths) Stairs / Additional Locomotion Stairs: No Wheelchair Mobility Wheelchair Mobility: No   Discharge Criteria: Patient will be discharged from PT if patient refuses treatment 3 consecutive times without medical reason, if treatment goals not met, if there is a change in medical status, if patient makes no progress towards goals or if patient is discharged from hospital.  The above assessment, treatment plan, treatment alternatives and goals were discussed and mutually  agreed upon: by patient and by family  Tawana Scale , PT, DPT, NCS, CSRS 05/29/2022, 8:02 AM

## 2022-05-29 NOTE — Progress Notes (Signed)
At 0750 this nurse entered the room to administer AM medications. Pts daughter is at bedside voicing concerns about how frequently they were woke up last night by staff following protocol and turning/providing incontinence care to the pt. This nurse educated daughter regarding the importance of staff coming in, but daughter states that she does not care about policies she wants her mother to get rest. She voices understanding that pt is incontinent x2 and is high risk for skin breakdown. Notified Dr. Letta Pate of this interaction with daughter and he would like for staff to try not to awaken pt if sleeping. He noted that pt is at high risk for sundowning if we throw off her sleep schedule.

## 2022-05-29 NOTE — Progress Notes (Signed)
[facility] Individual Statement of Services  Patient Name:  Katelyn Powers  Date:  05/29/2022  Welcome to the [facility].  Our goal is to provide you with an individualized program based on your diagnosis and situation, designed to meet your specific needs.  With this comprehensive rehabilitation program, you will be expected to participate in at least 3 hours of rehabilitation therapies Monday-Friday, with modified therapy programming on the weekends.  Your rehabilitation program will include the following services:  Physical Therapy (PT), Occupational Therapy (OT), Speech Therapy (ST), 24 hour per day rehabilitation nursing, Therapeutic Recreaction (TR), Neuropsychology, Care Coordinator, Rehabilitation Medicine, Nutrition Services, Pharmacy Services, and Other  Weekly team conferences will be held on Wednesdays to discuss your progress.  Your Inpatient Rehabilitation Care Coordinator will talk with you frequently to get your input and to update you on team discussions.  Team conferences with you and your family in attendance may also be held.  Expected length of stay: 14-21 Days  Overall anticipated outcome: MOD I to Supervision  Depending on your progress and recovery, your program may change. Your Inpatient Rehabilitation Care Coordinator will coordinate services and will keep you informed of any changes. Your Inpatient Rehabilitation Care Coordinator's name and contact numbers are listed  below.  The following services may also be recommended but are not provided by the [facility]:   [place] will be made to provide these services after discharge if needed.  Arrangements include referral to agencies that provide these services.  Your insurance has been verified to be:  [employer]  Your primary doctor is:  Cyndi Bender, PA-C  Pertinent information  will be shared with your doctor and your insurance company.  Inpatient Rehabilitation Care Coordinator:  Erlene Quan, [place] or [phone]  Information discussed with and copy given to patient by: Dyanne Iha, 05/29/2022, 12:05 PM

## 2022-05-29 NOTE — Patient Care Conference (Signed)
Inpatient RehabilitationTeam Conference and Plan of Care Update Date: 05/29/2022   Time: 10:14 AM    Patient Name: Katelyn Powers      Medical Record Number: [id number]  Date of Birth: 10-26-1932 Sex: Female         Room/Bed: 4W10C/4W10C-01 Payor Info: Payor: Theme park [employer] MEDICARE / Plan: [employer] MEDICARE / Product Type: *No Product type* /    Admit Date/Time:  05/28/2022  2:51 PM  Primary Diagnosis:  Cerebrovascular accident (CVA) of right thalamus [facility])  Hospital Problems: Principal Problem:   Cerebrovascular accident (CVA) of right thalamus [facility])    Expected Discharge Date: Expected Discharge Date:  (Evals pending)  Team Members Present: Physician leading conference: Dr. Alysia Penna Social Worker Present: Erlene Quan, BSW Nurse Present: Dorthula Nettles, RN PT Present: Barrie Folk, PT OT Present: Willeen Cass, OT SLP Present: Sherren Kerns, SLP PPS Coordinator present : Gunnar Fusi, SLP     Current Status/Progress Goal Weekly Team Focus  Bowel/Bladder   Incontinent x2  Regain continence  Continue ordered bladder program.   Swallow/Nutrition/ Hydration             ADL's   evals pending         Mobility   Eval Pending  Eval Pending  Eval Pending.   Communication             Safety/Cognition/ Behavioral Observations            Pain   no c/o pain  remain pain free  Assess qshift and prn   Skin   Redness to groin/abd folds.  Prevent Further deteroration  Assess Qshift and prn     Discharge Planning:    Eval pending  Team Discussion: Previous stroke. A&O to self. Dementia/Alzheimer's. Left neglect, possible field cut. High risk for sun downing. Incontinent bladder, Miralax for bowels. No reported pain. Sponged bathed PTA. Family to provide 24/7 supervision but not physical assist. Toilet every 2 hrs while awake.  Patient on target to meet rehab goals: Evals pending  *See Care Plan and progress notes for long and short-term goals.    Revisions to Treatment Plan:  Adjust medications as needed   Teaching Needs: Family education, medication management, bowel/bladder management, transfer/gait training, etc.   Current Barriers to Discharge: Decreased caregiver support, Home enviroment access/layout, Incontinence, Lack of/limited family support, and Insurance for SNF coverage  Possible Resolutions to Barriers: Family education Medication education      Medical Summary Current Status: moderately severe dementia, with new R PCA infarct  Barriers to Discharge: Medical stability   Possible Resolutions to [employer] Focus: rehab evals, normalize sleep pattern, family ed   Continued Need for Acute Rehabilitation Level of Care: The patient requires daily medical management by a physician with specialized training in physical medicine and rehabilitation for the following reasons: Direction of a multidisciplinary physical rehabilitation program to maximize functional independence : Yes Medical management of patient stability for increased activity during participation in an intensive rehabilitation regime.: Yes Analysis of laboratory values and/or radiology reports with any subsequent need for medication adjustment and/or medical intervention. : Yes   I attest that I was present, lead the team conference, and concur with the assessment and plan of the team.   Cristi Loron 05/29/2022, 8:53 PM

## 2022-05-29 NOTE — Plan of Care (Signed)
  Problem: RH Balance Goal: LTG Patient will maintain dynamic sitting balance (PT) Description: LTG:  Patient will maintain dynamic sitting balance with assistance during mobility activities (PT) Flowsheets (Taken 05/29/2022 2316) LTG: Pt will maintain dynamic sitting balance during mobility activities with:: Supervision/Verbal cueing Goal: LTG Patient will maintain dynamic standing balance (PT) Description: LTG:  Patient will maintain dynamic standing balance with assistance during mobility activities (PT) Flowsheets (Taken 05/29/2022 2316) LTG: Pt will maintain dynamic standing balance during mobility activities with:: Minimal Assistance - Patient > 75%   Problem: Sit to Stand Goal: LTG:  Patient will perform sit to stand with assistance level (PT) Description: LTG:  Patient will perform sit to stand with assistance level (PT) Flowsheets (Taken 05/29/2022 2316) LTG: PT will perform sit to stand in preparation for functional mobility with assistance level: Minimal Assistance - Patient > 75%   Problem: RH Bed Mobility Goal: LTG Patient will perform bed mobility with assist (PT) Description: LTG: Patient will perform bed mobility with assistance, with/without cues (PT). Flowsheets (Taken 05/29/2022 2316) LTG: Pt will perform bed mobility with assistance level of: Minimal Assistance - Patient > 75%   Problem: RH Bed to Chair Transfers Goal: LTG Patient will perform bed/chair transfers w/assist (PT) Description: LTG: Patient will perform bed to chair transfers with assistance (PT). Flowsheets (Taken 05/29/2022 2316) LTG: Pt will perform Bed to Chair Transfers with assistance level: Minimal Assistance - Patient > 75%   Problem: RH Car Transfers Goal: LTG Patient will perform car transfers with assist (PT) Description: LTG: Patient will perform car transfers with assistance (PT). Flowsheets (Taken 05/29/2022 2316) LTG: Pt will perform car transfers with assist:: Minimal Assistance - Patient > 75%    Problem: RH Ambulation Goal: LTG Patient will ambulate in controlled environment (PT) Description: LTG: Patient will ambulate in a controlled environment, # of feet with assistance (PT). Flowsheets (Taken 05/29/2022 2316) LTG: Pt will ambulate in controlled environ  assist needed:: Minimal Assistance - Patient > 75% LTG: Ambulation distance in controlled environment: 28f using LRAD Goal: LTG Patient will ambulate in home environment (PT) Description: LTG: Patient will ambulate in home environment, # of feet with assistance (PT). Flowsheets (Taken 05/29/2022 2316) LTG: Pt will ambulate in home environ  assist needed:: Minimal Assistance - Patient > 75% LTG: Ambulation distance in home environment: 363fusing LRAD

## 2022-05-29 NOTE — IPOC Note (Addendum)
Overall Plan of Care [facility]) Patient Details Name: Katelyn Powers MRN: [id number] DOB: 16-Dec-1931  Admitting Diagnosis: Cerebrovascular accident (CVA) of right thalamus [facility])  Hospital Problems: Principal Problem:   Cerebrovascular accident (CVA) of right thalamus ([place])     Functional Problem List: Nursing Bladder, Bowel, Medication Management, Endurance, Safety  PT Balance, Motor, Safety, Behavior, Nutrition, Sensory, Edema, Pain, Skin Integrity, Endurance, Perception  OT Balance, Cognition, Endurance, Motor, Perception, Safety, Vision  SLP    TR         Basic ADL's: OT Grooming, Bathing, Dressing, Toileting, Eating     Advanced  ADL's: OT       Transfers: PT Bed Mobility, Bed to Chair, Car, Manufacturing systems engineer, Metallurgist: PT Ambulation, Emergency planning/management officer, Stairs     Additional Impairments: OT Fuctional Use of Upper Extremity  SLP        TR      Anticipated Outcomes Item Anticipated Outcome  Self Feeding S  Swallowing      Basic self-care  MIN A  Toileting  MOD   Bathroom Transfers MIN  Bowel/Bladder  manage bowel w mod I assist and bladder w toileting  Transfers  min assist using LRAD  Locomotion  min assist using LRAD  Communication     Cognition     Pain  n/a  Safety/Judgment  manage safety w cues   Therapy Plan: PT Intensity: Minimum of 1-2 x/day ,45 to 90 minutes PT Frequency: 5 out of 7 days PT Duration Estimated Length of Stay: ~3-3.5 weeks OT Intensity: Minimum of 1-2 x/day, 45 to 90 minutes OT Frequency: 5 out of 7 days OT Duration/Estimated Length of Stay: 3-3.5 weeks     Team Interventions: Nursing Interventions Patient/Family Education, Bowel Management, Medication Management, Disease Management/Prevention, Bladder Management, Discharge Planning  PT interventions Ambulation/gait training, Cognitive remediation/compensation, Discharge planning, DME/adaptive equipment instruction, Functional mobility training,  Pain management, Psychosocial support, Splinting/orthotics, Therapeutic Activities, Visual/perceptual remediation/compensation, UE/LE Strength taining/ROM, Training and development officer, Community reintegration, Disease management/prevention, Functional electrical stimulation, Neuromuscular re-education, Patient/family education, Skin care/wound management, Stair training, Therapeutic Exercise, UE/LE Coordination activities, Wheelchair propulsion/positioning  OT Interventions Training and development officer, Discharge planning, Pain management, Self Care/advanced ADL retraining, UE/LE Coordination activities, Therapeutic Activities, Visual/perceptual remediation/compensation, Skin care/wound managment, Therapeutic Exercise, Patient/family education, Functional mobility training, Disease mangement/prevention, Cognitive remediation/compensation, Community reintegration, Engineer, drilling, Neuromuscular re-education, Psychosocial support, Splinting/orthotics, UE/LE Strength taining/ROM, Wheelchair propulsion/positioning  SLP Interventions    TR Interventions    SW/CM Interventions Discharge Planning, Psychosocial Support, Patient/Family Education, Disease Management/Prevention   Barriers to Discharge MD   Moderate/severe dementia  Nursing Decreased caregiver support, Home environment access/layout, Incontinence home alone PTA; 1 level 5 ste no rails, daughters check in week at a time; sponge bathes  PT Inaccessible home environment, Decreased caregiver support, Home environment access/layout, Incontinence    OT Incontinence    SLP      SW Insurance for SNF coverage Patient will discharge permantely to [place] home and daughter and family will roatate supervision   Team Discharge Planning: Destination: PT-Home ,OT- Home , SLP-  Projected Follow-up: PT-Home health PT, 24 hour supervision/assistance, OT-  Home health OT, SLP-  Projected Equipment Needs: PT-To be determined, OT- To be  determined, SLP-  Equipment Details: PT- , OT-  Patient/family involved in discharge planning: PT- Family member/caregiver,  OT-Family member/caregiver, SLP-   MD ELOS: 14-16d Medical Rehab Prognosis:  Fair Assessment: The patient has been admitted for CIR therapies with the diagnosis of RIght  thalamic infarct. The team will be addressing functional mobility, strength, stamina, balance, safety, adaptive techniques and equipment, self-care, bowel and bladder mgt, patient and caregiver education, sleep schedulae management. Goals have been set at Supervision. Anticipated discharge destination is home with family support.        See Team Conference Notes for weekly updates to the plan of care

## 2022-05-29 NOTE — Progress Notes (Signed)
Inpatient Rehabilitation  Patient information reviewed and entered into eRehab system by Denice Cardon M. Milta Croson, M.A., CCC/SLP, PPS Coordinator.  Information including medical coding, functional ability and quality indicators will be reviewed and updated through discharge.    

## 2022-05-29 NOTE — Progress Notes (Signed)
Inpatient Rehabilitation Care Coordinator Assessment and Plan Patient Details  Name: Katelyn Powers MRN: [id number] Date of Birth: Feb 25, 1932  Today's Date: 05/29/2022  Hospital Problems: Principal Problem:   Cerebrovascular accident (CVA) of right thalamus [facility])  Past Medical History:  Past Medical History:  Diagnosis Date   Cataract    Gout    Hyperlipidemia    Hypertension    Memory loss    NSTEMI (non-ST elevated myocardial infarction) ([place]) 08/2020   Right-sided carotid artery disease ([place]) 09/17/2020   carotid doppler wth >60% occlusion   Past Surgical History:  Past Surgical History:  Procedure Laterality Date   MELANOMA EXCISION Right 10/28/1970   level 2   SUPERFICIAL LYMPH NODE BIOPSY / EXCISION  10/28/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 She has never used smokeless tobacco. She reports that she does not drink alcohol and does not use drugs.  Family / Support Systems Children: [name] and [name] Anticipated Caregiver: [name] and [name] Ability/Limitations of Caregiver: none Caregiver Availability: 24/7 Family Dynamics: support from daughters  Social History Preferred language: English Religion: [employer] - How often do you need to have someone help you when you read instructions, pamphlets, or other written material from your doctor or pharmacy?: Patient unable to respond Writes: Yes   Abuse/Neglect Abuse/Neglect Assessment Can Be Completed: Unable to assess, patient is non-responsive or altered mental status  Patient response to: Social Isolation - How often do you feel lonely or isolated from those around you?: Patient declines to respond  Emotional Status Recent Psychosocial Issues: coping Psychiatric History: n/a Substance Abuse History: n/a  Patient / Family Perceptions, Expectations & Goals Premorbid pt/family roles/activities: Previously MOD I with some assistance from  daughter with gait and ADLS Anticipated changes in roles/activities/participation: Daughter able to continue with assistance and family. Pt/family expectations/goals: MOD I/ Supervision  [employer]: None Premorbid Home Care/DME Agencies: Other (Comment) (RW, [employer], Civil engineer, contracting) Transportation available at discharge: daughters able to transport Is the patient able to respond to transportation needs?: Yes In the past 12 months, has lack of transportation kept you from medical appointments or from getting medications?: No In the past 12 months, has lack of transportation kept you from meetings, work, or from getting things needed for daily living?: No  Discharge Planning Living Arrangements: Children Support Systems: Children Type of Residence: Private residence Insurance Resources: Multimedia programmer (specify) [facility] MEDICARE) Financial Screen Referred: No Living Expenses: Lives with family Money Management: Family Does the patient have any problems obtaining your medications?: No Home Management: Independent Patient/Family Preliminary Plans: Daughter able to assist with medication management Care Coordinator Barriers to Discharge: Insurance for SNF coverage Care Coordinator Barriers to Discharge Comments: Patient will discharge permantely to [facility] home and daughter and family will roatate supervision Care Coordinator Anticipated Follow Up Needs: HH/OP Expected length of stay: 14-21 Days  Clinical Impression SW met with patient and daughter, [name] introduced self and explained role. Previously patient rotated between daughter homes. At discharge patient will discharge home with [name] primarily and daughters and family will rotate supervision. No additional questions or concerns, sw will continue to follow up.   Dyanne Iha 05/29/2022, 1:15 PM

## 2022-05-30 DIAGNOSIS — I6381 Other cerebral infarction due to occlusion or stenosis of small artery: Secondary | ICD-10-CM | POA: Diagnosis not present

## 2022-05-30 NOTE — Progress Notes (Signed)
Physical Therapy Session Note  Patient Details  Name: Katelyn Powers MRN: [id number] Date of Birth: 1932-05-15  Today's Date: 05/30/2022 PT Individual Time: 0915-1012; 14:15-15:25 Treatment time: 57 minutes session 1; 70 minutes session 2  Session 1 In wheelchair with family in room on arrival, sleeping. Awoke easily. Agreeable to PT at this time.  TRANSFERS: In parallel bars: sit<>stands with mod/max assist at pelvis with left knee blocked. Progressing to min assist as session and reps progressed. Cues each time on hand placement, anterior weight shifting and hip/trunk extensionin standing. Less cues needed as reps progressed with last few stands needing min assist with no cues for posture. In standing working toward tall posture, lateral weight shifting with bil UE support and single UE reaching toward tech hand as target to promote taller/more upright posture.    STRENGTHENING:  Bil LE's seated in wheelchair: LAQs, marching and pillow squeezes, AAROM x 10 reps each. Cues to wake up and to task.   GAIT: In parallel bars: mod/max assist with tech bringing chair behind pt. Use of red theraband to left LE for DF and hip/knee flexion assist with shoe cover over to keep band from getting caught on floor. Cues/facilitation needed for posture, sequencing, assist/cues to advance left hand along rail and left LE with stepping. On 2cd and 3rd reps min/mod assist at pelvis for posture/balance with pt self advancing bil LE's with verbal/tactile cues for sequencing only. Chair follow by rehab tech.   Pt left in room in wheelchair with alarm belt, needs in reach and family at bedside.   Session 2 Pt in bed on arrival with family at bedside. Agreeable to PT at this time.  BED MOBILITY: Pt able to self advance right LE toward edge of bed, min assist to advance left LE. With cues/facilitation/rail of bed, mod assist needed to complete trunk transition into sitting at edge of bed. Mod assist to scoot closer  to EOB with pads under pt used to facilitate movements. At end of session mod assist for sitting EOB to supine in bed with cues on technique, most assistance needed for controlled lowering of trunk and to clear bed surface with left LE.   TRANSFERS: SPT bed<>wheelchair and wheelchair<>mat table with mod/max assist. Increased assistance toward end of session. Cues each time on hand placement, weight shifting and to advance feet toward target surface each time. Assistance/facilitation needed with left LE. Sit<>stands x 3 reps in parallel bars with pt pulling up on bars, min/mod assist with cues for incr anterior weight shifting and for hip/trunk extension in standing for tall posture.  Sit<>stands wheelchair<>RW mod/max assist with tech for safety. Cues for technique and weight shifting.  GAIT: continued with use of red band assist for DF, knee/hip flexion with inside out shoe cover so foot slides In parallel bars: 3 laps with bil UE support, min<>mod assist for balance (decreased as reps progressed). Pt needing assist/facilitation with left LE advancement on 1st rep, self advancing on next 2 reps. Tech with wheelchair follow for safety. With RW: 7-8 feet x 2 with varied assist/facilitation needed (min to max) for posture, sequencing, left LE advancement and walker management. Tech hands off with close chair follow.    STRENGTHENING/NMR: Seated edge of mat: Left LE: long arc quads and marching x 10 reps.   For balance: ball toss with cues to engage with bil UE's starting with PTA more on pt's right side and working toward being just past midline. Mod to max cues needed for pt  to attend when more midline or just past midline on pt's left side.   Pt left in bed with bed alarm on, needs in reach and family at bedside.     Short Term Goals: Week 1:  PT Short Term Goal 1 (Week 1): Pt will perform supine<>sit with mod assist using bed features PT Short Term Goal 2 (Week 1): Pt will perform sit<>stand  transfers using LRAD with max assist of 1 PT Short Term Goal 3 (Week 1): Pt will perform bed<>chair transfers using LRAD with mod assist without lift equipment PT Short Term Goal 4 (Week 1): Pt will ambulate at least 54f using LRAD with +2 mod assist  Skilled Therapeutic Interventions/Progress Updates:      Therapy Documentation Precautions:  Precautions Precautions: Fall, Other (comment) Precaution Comments: L inattention, hx of Alzheimer's Restrictions Weight Bearing Restrictions: No General:   Vital Signs: Therapy Vitals Temp: 97.9 F (36.6 C) Pulse Rate: 76 Resp: 17 BP: (!) 117/57 Patient Position (if appropriate): Lying Oxygen Therapy SpO2: 98 % O2 Device: Room Air    Therapy/Group: Individual Therapy  KWillow Ora PTA, CKeom[place] Village08/03/23, 4:43 PM

## 2022-05-30 NOTE — Progress Notes (Addendum)
PROGRESS NOTE   Subjective/Complaints:  Slept well last noc, per daughter is groggy this am did not eat much.  Overall intake 50-75% meals recorded , fluid intake is fair   ROS- unable to obtain due to cognitive deficits  Objective:   No results found. Recent Labs    05/29/22 0727  WBC 12.6*  HGB 13.3  HCT 39.2  PLT 224    Recent Labs    05/29/22 0727  NA 136  K 4.1  CL 103  CO2 23  GLUCOSE 137*  BUN 25*  CREATININE 1.23*  CALCIUM 9.7    Intake/Output Summary (Last 24 hours) at 05/30/2022 0848 Last data filed at 05/30/2022 0840 Gross per 24 hour  Intake 594 ml  Output --  Net 594 ml         Physical Exam: Vital Signs Blood pressure (!) 121/55, pulse 69, temperature 97.8 F (36.6 C), resp. rate 19, height '5\' 2"'$  (1.575 m), weight 68.3 kg, SpO2 96 %.   General: No acute distress Mood and affect are appropriate Heart: Regular rate and rhythm no rubs murmurs or extra sounds Lungs: Clear to auscultation, breathing unlabored, no rales or wheezes Abdomen: Positive bowel sounds, soft nontender to palpation, nondistended Extremities: No clubbing, cyanosis, or edema Skin: No evidence of breakdown, no evidence of rash Neurologic: oriented to self only, motor strength is 5/5 in bilateral deltoid, bicep, tricep, grip, hip flexor, knee extensors, ankle dorsiflexor and plantar flexor Left neglect evident.  Cannot assess sensation due to cog def Musculoskeletal: Full range of motion in all 4 extremities. No joint swelling   Assessment/Plan: 1. Functional deficits which require 3+ hours per day of interdisciplinary therapy in a comprehensive inpatient rehab setting. Physiatrist is providing close team supervision and 24 hour management of active medical problems listed below. Physiatrist and rehab team continue to assess barriers to discharge/monitor patient progress toward functional and medical goals  Care  Tool:  Bathing    Body parts bathed by patient: Chest, Abdomen   Body parts bathed by helper: Right arm, Left arm, Front perineal area, Buttocks, Right upper leg, Left upper leg, Right lower leg, Left lower leg, Face     Bathing assist Assist Level: Total Assistance - Patient < 25%     Upper Body Dressing/Undressing Upper body dressing   What is the patient wearing?: Button up shirt    Upper body assist Assist Level: Total Assistance - Patient < 25%    Lower Body Dressing/Undressing Lower body dressing      What is the patient wearing?: Pants, Incontinence brief     Lower body assist Assist for lower body dressing: 2 Helpers     Toileting Toileting    Toileting assist Assist for toileting: 2 Helpers     Transfers Chair/bed transfer  Transfers assist  Chair/bed transfer activity did not occur: Safety/medical concerns  Chair/bed transfer assist level: 2 Helpers     Locomotion Ambulation   Ambulation assist      Assist level: 2 helpers Assistive device: Other (comment) (3 Musketeer) Max distance: 37f   Walk 10 feet activity   Assist     Assist level: 2 helpers Assistive device: Other (  comment) (3 Musketeer)   Walk 50 feet activity   Assist Walk 50 feet with 2 turns activity did not occur: Safety/medical concerns         Walk 150 feet activity   Assist Walk 150 feet activity did not occur: Safety/medical concerns         Walk 10 feet on uneven surface  activity   Assist Walk 10 feet on uneven surfaces activity did not occur: Safety/medical concerns         Wheelchair     Assist Is the patient using a wheelchair?: Yes Type of Wheelchair: Manual    Wheelchair assist level: Dependent - Patient 0%      Wheelchair 50 feet with 2 turns activity    Assist        Assist Level: Dependent - Patient 0%   Wheelchair 150 feet activity     Assist      Assist Level: Dependent - Patient 0%   Blood pressure (!)  121/55, pulse 69, temperature 97.8 F (36.6 C), resp. rate 19, height '5\' 2"'$  (1.575 m), weight 68.3 kg, SpO2 96 %. Medical Problem List and Plan: 1. Functional deficits secondary to right thalamic infarct PCA distribution ; Alzheimner's dementia and prior stroke/TIA with left arm weakness which has improved  and left hemianopsia             -patient may shower             -ELOS/Goals: 14-21 days, min assist goals,  2.  Antithrombotics: -DVT/anticoagulation:  Pharmaceutical: Lovenox             -antiplatelet therapy: Plavix and aspirin 81 mg (family declines Brilinta) 3. Pain Management: Tylenol as needed 4. Mood/Behavior/Sleep: LCSW to evaluate and provide emotional support             -antipsychotic agents: n/a 5. Neuropsych/cognition: This patient is not capable of making decisions on her own behalf. 6. Skin/Wound Care: Routine skin care checks 7. Fluids/Electrolytes/Nutrition: Routine I's and O's and follow-up chemistries             -heart healthy diet/thin liq; crush meds in pureed Poor fluid intake will recheck BMET in am  8: Hypertension: continue amlodipine 5 mg daily             -Cozaar 50 mg daily is being held Vitals:   05/29/22 1927 05/30/22 0405  BP: 129/65 (!) 121/55  Pulse: 76 69  Resp: 18 19  Temp: 98.2 F (36.8 C) 97.8 F (36.6 C)  SpO2: 96% 96%    9: Hyperlipidemia: continue Zetia, Pravachol 10: CAD: stable, no CP 11: Alzheimer's dementia; follows with Dr. Felecia Shelling. Does not tolerate donepezil 12: Diastolic CHF: stable, no SOB or edema 13: Insomnia: continue melatonin>>need to give by 7-7:30 pm nightly- one daughter requests to not awaken pt for turning at night , the other daughter is ok with skin care protocol 14: Left nipple skin change: no sign of infection; follow-up as outpatient 15: Urinary incontinence:              -attempt timed voids             -check PVR's    LOS: 2 days A FACE TO FACE EVALUATION WAS PERFORMED  Charlett Blake 05/30/2022, 8:48  AM

## 2022-05-30 NOTE — Progress Notes (Signed)
Occupational Therapy Session Note  Patient Details  Name: Katelyn Powers MRN: [id number] Date of Birth: [birth year]  Today's Date: 05/30/2022 OT Individual Time: 1300-1328 OT Individual Time Calculation (min): 28 min    Short Term Goals: Week 1:  OT Short Term Goal 1 (Week 1): Pt will trasnfer to toilet with MOD A and LRAD OT Short Term Goal 2 (Week 1): Pt will locate 3/5 neded ADL items on L of center to demo improved L attention OT Short Term Goal 3 (Week 1): Pt will initate 1/4 steps of UB dressing OT Short Term Goal 4 (Week 1): Pt will complete oral care with MOD cuing for sequencing  Skilled Therapeutic Interventions/Progress Updates:    Pt resting in w/c upon arrival with family (5 persons) present. Pt initially resistant to leaving room but finally in agreement. OT intervention with focus on attention to task, following one step commands, sit<>stand, and activity tolerance to increase independence with BADLs. Pt not oriented to place or situation. Pt required max verbal cues for attention to tasks and commands. PT with Rt gaze preference and question visual deficits on Lt. Unable to formally assess 2/2 cognitive deficits. Sit<>stand X 1 with max A+2. Pt unable to maintain standing balance and returned to seat. Pt required max verbal/demonstration cues to toss small beach ball to OTA. Pt returned to room and returned to bed with max A+2. Pt remained in bed with NT present. Family present.   Therapy Documentation Precautions:  Precautions Precautions: Fall, Other (comment) Precaution Comments: L inattention, hx of Alzheimer's Restrictions Weight Bearing Restrictions: No Pain:  Pt denies pain this afternoon.   Therapy/Group: Individual Therapy  Leroy Libman 05/30/2022, 2:25 PM

## 2022-05-30 NOTE — Progress Notes (Signed)
Occupational Therapy Session Note  Patient Details  Name: Katelyn Powers MRN: [id number] Date of Birth: 1932/04/14  Today's Date: 05/30/2022 OT Individual Time: 4098-1191 OT Individual Time Calculation (min): 40 min    Short Term Goals: Week 1:  OT Short Term Goal 1 (Week 1): Pt will trasnfer to toilet with MOD A and LRAD OT Short Term Goal 2 (Week 1): Pt will locate 3/5 neded ADL items on L of center to demo improved L attention OT Short Term Goal 3 (Week 1): Pt will initate 1/4 steps of UB dressing OT Short Term Goal 4 (Week 1): Pt will complete oral care with MOD cuing for sequencing  Skilled Therapeutic Interventions/Progress Updates:    Pt resting in bed upon arrival with daughter present. Pt required max verbal cues to attend to OTA when addressed. OT intervention with focus on bed mobility, sit<>stand in [place], toileting, dressing standing balance, and activity tolerance to increase independence with BADLs. Supine>sit EOB with mod A. Sit<>stand in [place] with mod A. Transfer to bathroom with Stedy. Pt dependent for toileting.LB dressing with tot A. UB dressing with max A and verbal cues for sequencing. Pt required max verbal cues throughout session to initiate and attend to tasks. Pt frequently closed her eyes when not actively engage in task. Pt remained in w/c with belt alarm activated. All needs within reach. Daughter present.   Therapy Documentation Precautions:  Precautions Precautions: Fall, Other (comment) Precaution Comments: L inattention, hx of Alzheimer's Restrictions Weight Bearing Restrictions: No  Pain:  Pt retports Lt shoulder pain unrated; repositioned  Therapy/Group: Individual Therapy  Leroy Libman 05/30/2022, 9:01 AM

## 2022-05-31 LAB — BASIC METABOLIC PANEL
Anion gap: 12 (ref 5–15)
BUN: 29 mg/dL — ABNORMAL HIGH (ref 8–23)
CO2: 22 mmol/L (ref 22–32)
Calcium: 9.8 mg/dL (ref 8.9–10.3)
Chloride: 103 mmol/L (ref 98–111)
Creatinine, Ser: 1.21 mg/dL — ABNORMAL HIGH (ref 0.44–1.00)
GFR, Estimated: 43 mL/min — ABNORMAL LOW (ref >60)
Glucose, Bld: 154 mg/dL — ABNORMAL HIGH (ref 70–99)
Potassium: 4.1 mmol/L (ref 3.5–5.1)
Sodium: 137 mmol/L (ref 135–145)

## 2022-05-31 LAB — URINALYSIS, ROUTINE W REFLEX MICROSCOPIC
Bilirubin Urine: NEGATIVE
Glucose, UA: NEGATIVE mg/dL
Ketones, ur: NEGATIVE mg/dL
Nitrite: NEGATIVE
Protein, ur: 100 mg/dL — AB
Specific Gravity, Urine: 1.019 (ref 1.005–1.030)
pH: 5 (ref 5.0–8.0)

## 2022-05-31 NOTE — Progress Notes (Signed)
Occupational Therapy Session Note  Patient Details  Name: Katelyn Powers MRN: [id number] Date of Birth: 02/15/1932  Today's Date: 05/31/2022 OT Individual Time: 1025-8527 OT Individual Time Calculation (min): 59 min    Short Term Goals: Week 1:  OT Short Term Goal 1 (Week 1): Pt will trasnfer to toilet with MOD A and LRAD OT Short Term Goal 2 (Week 1): Pt will locate 3/5 neded ADL items on L of center to demo improved L attention OT Short Term Goal 3 (Week 1): Pt will initate 1/4 steps of UB dressing OT Short Term Goal 4 (Week 1): Pt will complete oral care with MOD cuing for sequencing  Skilled Therapeutic Interventions/Progress Updates:  Pt awake in bed with daughter and granddaughter upon OT arrival to the room. Pt reports, "We can try (ADLs)." Pt in agreement for OT session. [name] other daughter arrived and daughters switch shift to stay with pt during OT session.   Therapy Documentation Precautions:  Precautions Precautions: Fall, Other (comment) Precaution Comments: L inattention, hx of Alzheimer's Restrictions Weight Bearing Restrictions: No Vital Signs: Please see "Flowsheet" for most recent vitals charted by nursing staff.  Pain: Pain Assessment Pain Scale: 0-10 Pain Score: 0-No pain  ADL: Grooming: Supervision/safety (Pt able to complete oral care with close supervision and set-up of toothpaste with moderate VCs for thoroughness. Pt does require maximal assist to comb hair due to decrease alertness at end of session. Pt able to apply lipstick with minimal assistance for accuracy.) Where Assessed-Grooming: Wheelchair, Sitting at sink Upper Body Bathing: Minimal assistance, Moderate cueing (Pt able to participate in bathing all UB parts while supine in bed with moderate verbal and actile cues for sequencing and thoroughness duirng a sponge bath.) Where Assessed-Upper Body Bathing: Bed level Lower Body Bathing: Moderate assistance, Moderate cueing (Pt able to bathe upper  portion of B thighs with moderate verbal and tactile prompts. However, pt requires assistance to bathe B feet during a sponge bath at bed level.) Where Assessed-Lower Body Bathing: Bed level Upper Body Dressing: Minimal assistance (Pt able to don a pull-over shirt while seated at EOB with minimal assist to peform clothing adjustments around trunk.) Where Assessed-Upper Body Dressing: Edge of bed Lower Body Dressing: Maximal assistance (Pt requires maximal assist to don pants at bed level. However, pt able to attempt bridging and participate in pulling pants up with maximal assistance.) Where Assessed-Lower Body Dressing: Bed level ADL Comments: Pt requires increased time to complete ADLs due to decreased alertness and pt frequently closing eyes or falling asleep throughout ADLs as well as requiring moderate - maximal verbal and tactile prompts for initiation, sequecning, and thoroughness throughout. Pt able to transition to from supine > sittng at EOB with maximal assistance. Pt then able to perform sit <> stand from bed level with maximal assist. Pt requires maximal assist x 1 and minimal assist x 1 to complete a SPT to the R from EOB > w/c.  Pt requested to stay in the w/c at end of session. Pt left sitting comfortably in the w/c with personal belongings and call light within reach, belt alarm placed and activated, daughter present in room, and comfort needs attended to.   Therapy/Group: Individual Therapy  Barbee Shropshire 05/31/2022, 12:58 PM

## 2022-05-31 NOTE — Plan of Care (Signed)
  Problem: Consults Goal: RH STROKE PATIENT EDUCATION Description: See Patient Education module for education specifics  Outcome: Progressing   Problem: RH BOWEL ELIMINATION Goal: RH STG MANAGE BOWEL WITH ASSISTANCE Description: STG Manage Bowel with mod I Assistance. Outcome: Progressing Goal: RH STG MANAGE BOWEL W/MEDICATION W/ASSISTANCE Description: STG Manage Bowel with Medication with mod I Assistance. Outcome: Progressing   Problem: RH BLADDER ELIMINATION Goal: RH STG MANAGE BLADDER WITH ASSISTANCE Description: STG Manage Bladder With toileting  Assistance Outcome: Progressing   Problem: RH SAFETY Goal: RH STG ADHERE TO SAFETY PRECAUTIONS W/ASSISTANCE/DEVICE Description: STG Adhere to Safety Precautions With cues Assistance/Device. Outcome: Progressing   Problem: RH KNOWLEDGE DEFICIT Goal: RH STG INCREASE KNOWLEDGE OF DIABETES Description: Patient and daughter will be able to manage DM/prediabetes with dietary modifications using handouts and educational resources independently Outcome: Progressing Goal: RH STG INCREASE KNOWLEDGE OF HYPERTENSION Description: Patient and daughter will be able to manage HTN with medications and dietary modifications using handouts and educational resources independently Outcome: Progressing Goal: RH STG INCREASE KNOWLEGDE OF HYPERLIPIDEMIA Description: Patient and daughter will be able to manage HLD with medications and dietary modifications using handouts and educational resources independently Outcome: Progressing Goal: RH STG INCREASE KNOWLEDGE OF STROKE PROPHYLAXIS Description: Patient and daughter will be able to manage secondary risks with medications and dietary modifications using handouts and educational resources independently Outcome: Progressing

## 2022-05-31 NOTE — Progress Notes (Signed)
PROGRESS NOTE   Subjective/Complaints:  Poor po intake , per family MS not at baseline since admit , inc of urine Daughter would like Left nipple checked again, had this issue at home , mammogram neg, no discharge improved with steroid cream   ROS- unable to obtain due to cognitive deficits  Objective:   No results found. Recent Labs    05/29/22 0727  WBC 12.6*  HGB 13.3  HCT 39.2  PLT 224    Recent Labs    05/29/22 0727  NA 136  K 4.1  CL 103  CO2 23  GLUCOSE 137*  BUN 25*  CREATININE 1.23*  CALCIUM 9.7     Intake/Output Summary (Last 24 hours) at 05/31/2022 0831 Last data filed at 05/30/2022 1230 Gross per 24 hour  Intake 360 ml  Output --  Net 360 ml         Physical Exam: Vital Signs Blood pressure 134/69, pulse 78, temperature 97.9 F (36.6 C), resp. rate 16, height '5\' 2"'$  (1.575 m), weight 68.3 kg, SpO2 97 %.   General: No acute distress Mood and affect are appropriate Heart: Regular rate and rhythm no rubs murmurs or extra sounds Lungs: Clear to auscultation, breathing unlabored, no rales or wheezes Abdomen: Positive bowel sounds, soft nontender to palpation, nondistended Extremities: No clubbing, cyanosis, or edema Skin: No evidence of breakdown, no evidence of rash Neurologic: oriented to self only, motor strength is 5/5 in bilateral deltoid, bicep, tricep, grip, hip flexor, knee extensors, ankle dorsiflexor and plantar flexor Left neglect evident.  Cannot assess sensation due to cog def Musculoskeletal: Full range of motion in all 4 extremities. No joint swelling   Assessment/Plan: 1. Functional deficits which require 3+ hours per day of interdisciplinary therapy in a comprehensive inpatient rehab setting. Physiatrist is providing close team supervision and 24 hour management of active medical problems listed below. Physiatrist and rehab team continue to assess barriers to  discharge/monitor patient progress toward functional and medical goals  Care Tool:  Bathing    Body parts bathed by patient: Chest, Abdomen   Body parts bathed by helper: Right arm, Left arm, Front perineal area, Buttocks, Right upper leg, Left upper leg, Right lower leg, Left lower leg, Face     Bathing assist Assist Level: Total Assistance - Patient < 25%     Upper Body Dressing/Undressing Upper body dressing   What is the patient wearing?: Button up shirt    Upper body assist Assist Level: Total Assistance - Patient < 25%    Lower Body Dressing/Undressing Lower body dressing      What is the patient wearing?: Pants, Incontinence brief     Lower body assist Assist for lower body dressing: 2 Helpers     Toileting Toileting    Toileting assist Assist for toileting: 2 Helpers     Transfers Chair/bed transfer  Transfers assist  Chair/bed transfer activity did not occur: Safety/medical concerns  Chair/bed transfer assist level: 2 Helpers     Locomotion Ambulation   Ambulation assist      Assist level: 2 helpers Assistive device: Other (comment) (3 Musketeer) Max distance: 67f   Walk 10 feet activity  Assist     Assist level: 2 helpers Assistive device: Other (comment) (3 Musketeer)   Walk 50 feet activity   Assist Walk 50 feet with 2 turns activity did not occur: Safety/medical concerns         Walk 150 feet activity   Assist Walk 150 feet activity did not occur: Safety/medical concerns         Walk 10 feet on uneven surface  activity   Assist Walk 10 feet on uneven surfaces activity did not occur: Safety/medical concerns         Wheelchair     Assist Is the patient using a wheelchair?: Yes Type of Wheelchair: Manual    Wheelchair assist level: Dependent - Patient 0%      Wheelchair 50 feet with 2 turns activity    Assist        Assist Level: Dependent - Patient 0%   Wheelchair 150 feet activity      Assist      Assist Level: Dependent - Patient 0%   Blood pressure 134/69, pulse 78, temperature 97.9 F (36.6 C), resp. rate 16, height '5\' 2"'$  (1.575 m), weight 68.3 kg, SpO2 97 %. Medical Problem List and Plan: 1. Functional deficits secondary to right thalamic infarct PCA distribution ; Alzheimner's dementia and prior stroke/TIA with left arm weakness which has improved  and left hemianopsia             -patient may shower             -ELOS/Goals: 14-21 days, min assist goals,  2.  Antithrombotics: -DVT/anticoagulation:  Pharmaceutical: Lovenox             -antiplatelet therapy: Plavix and aspirin 81 mg (family declines Brilinta) 3. Pain Management: Tylenol as needed 4. Mood/Behavior/Sleep: LCSW to evaluate and provide emotional support             -antipsychotic agents: n/a 5. Neuropsych/cognition: This patient is not capable of making decisions on her own behalf. 6. Skin/Wound Care: Routine skin care checks 7. Fluids/Electrolytes/Nutrition: Routine I's and O's and follow-up chemistries             -heart healthy diet/thin liq; crush meds in pureed Poor fluid intake will recheck BMET in am  8: Hypertension: continue amlodipine 5 mg daily             -Cozaar 50 mg daily is being held Vitals:   05/31/22 0646 05/31/22 0819  BP: 130/61 134/69  Pulse: 70 78  Resp: 16   Temp: 97.9 F (36.6 C)   SpO2: 97%    Controlled 8/4 9: Hyperlipidemia: continue Zetia, Pravachol 10: CAD: stable, no CP 11: Alzheimer's dementia; follows with Dr. Felecia Shelling. Does not tolerate donepezil 12: Diastolic CHF: stable, no SOB or edema 13: Insomnia: continue melatonin>>need to give by 7-7:30 pm nightly- one daughter requests to not awaken pt for turning at night , the other daughter is ok with skin care protocol 14: Left nipple skin change: no sign of infection; follow-up as outpatient 15: Urinary incontinence:              -attempt timed voids             -check PVR's   16.  Mild leukocytosis,  persistent AMS since hospitalization check UA C and S cath spec since pt is incont  LOS: 3 days A FACE TO FACE EVALUATION WAS PERFORMED  Charlett Blake 05/31/2022, 8:31 AM

## 2022-05-31 NOTE — Progress Notes (Signed)
Physical Therapy Session Note  Patient Details  Name: Katelyn Powers MRN: [id number] Date of Birth: 1932-01-11  Today's Date: 05/31/2022 PT Individual Time: 1004-1100 1350-1435 PT Individual Time Calculation (min): 56 min 45 min   Short Term Goals: Week 1:  PT Short Term Goal 1 (Week 1): Pt will perform supine<>sit with mod assist using bed features PT Short Term Goal 2 (Week 1): Pt will perform sit<>stand transfers using LRAD with max assist of 1 PT Short Term Goal 3 (Week 1): Pt will perform bed<>chair transfers using LRAD with mod assist without lift equipment PT Short Term Goal 4 (Week 1): Pt will ambulate at least 62f using LRAD with +2 mod assist   Skilled Therapeutic Interventions/Progress Updates:  Session 1.  Pt received sitting in WC and agreeable to PT  Sit<>stand  Pregait in parallel bars. Gait training with RW. Stand pivot tranfers. Nustep. Patient returned to room and [facility] reach and all needs met.     Session 2.  Pt received sitting in WC and agreeable to PT  Transported to rehab gym. Sustained attention task/visual scanning to perform ball tap with 1# bar weight. Ball catch/toss.   [place]   Stair management training with max  assist and cues from PT.   Pt returned to room and performed ambulatory transfer to bed with RW and mod assist with max cues for sequencing, initiation, and AD management. Sit>supine completed with max assist +2, and left supine in bed with call bell in reach and all needs met.         Therapy Documentation Precautions:  Precautions Precautions: Fall, Other (comment) Precaution Comments: L inattention, hx of Alzheimer's Restrictions Weight Bearing Restrictions: No General:   Vital Signs: Therapy Vitals Pulse Rate: 78 BP: 134/69 Pain: Pain Assessment Pain Scale: 0-10 Pain Score: 0-No pain Mobility:   Locomotion :    Trunk/Postural Assessment :    Balance:   Exercises:    Other Treatments:      Therapy/Group: Individual Therapy  ALorie Phenix8/01/2022, 11:06 AM

## 2022-06-01 NOTE — Progress Notes (Signed)
Physical Therapy Session Note  Patient Details  Name: Katelyn Powers MRN: [id number] Date of Birth: May 27, 1932  Today's Date: 06/01/2022 PT Individual Time: [phone] and 2957-4734 PT Individual Time Calculation (min): 56 min and 40 min   Short Term Goals: Week 1:  PT Short Term Goal 1 (Week 1): Pt will perform supine<>sit with mod assist using bed features PT Short Term Goal 2 (Week 1): Pt will perform sit<>stand transfers using LRAD with max assist of 1 PT Short Term Goal 3 (Week 1): Pt will perform bed<>chair transfers using LRAD with mod assist without lift equipment PT Short Term Goal 4 (Week 1): Pt will ambulate at least 46f using LRAD with +2 mod assist  Skilled Therapeutic Interventions/Progress Updates:  Session 1.   Pt received supine in bed and agreeable to PT. Supine>sit transfer with mod-max assist and cues for initiation of movement.   Dressing at EOB to don pants and shirt. Max assist into standing on this day with poor positioning of BLE despite attempted facilitation from PT. Max assist for upper body dressing and total A for lower body dressing.   Stand pivot transfer. To WC and to toilet with max assist as pt demonstrating increased fear of falling on this day. Pt able to void bladder and small BM sitting on toilet. Pericare performed by PT with mod assist for balance .   Eating in [place]ith max cues for initiation of movement and attention to task with use of RUE to bring food to mouth via spoon.   Standing balance/tolerance to performed ball tap with RUE and LUE supported on RW. Intermittent min-mod assist to prevent lateral LOB to the L. \performed 2 x 12-15 with max cues for visual scanning to locate ball.   Gait training with RW x 361fwith mod-max assist to improve AD management, weight shift to the R, and increased step length on the LLE. . Marland KitchenPatient returned to room and left [facility] reach and all needs met.     Session 2.   Pt received  sitting in WC and agreeable to PT  Toiletting with stedy for urination and only smear of stool mod-max assist to achieve standing in stedy this afternoon [place].   Sustained attention and lateral scanning seated at BITs 2bouts with each UE requiring max cues for sustained attention to task and anterior weight shift as well as forward reach with each UE ableto locate and tap 10-12 targets on each bout of 1 minute .   Pt falling asleep between bouts.   Pt returned to room and performed  stand pivot transfer to bed with RWand max assist. Sit>supine completed with mod-max assist at BLE, and left supine in bed with call bell in reach and all needs met.        Therapy Documentation Precautions:  Precautions Precautions: Fall, Other (comment) Precaution Comments: L inattention, hx of Alzheimer's Restrictions Weight Bearing Restrictions: No General: PT Amount of Missed Time (min): 20 Minutes PT Missed Treatment Reason: Patient fatigue Vital Signs: Therapy Vitals Pulse Rate: 79 BP: 108/84 Pain: denies    Therapy/Group: Individual Therapy  AuLorie Phenix/02/2022, 1:49 PM

## 2022-06-01 NOTE — Plan of Care (Signed)
  Problem: Consults Goal: RH STROKE PATIENT EDUCATION Description: See Patient Education module for education specifics  Outcome: Progressing   Problem: RH BOWEL ELIMINATION Goal: RH STG MANAGE BOWEL WITH ASSISTANCE Description: STG Manage Bowel with mod I Assistance. Outcome: Progressing Goal: RH STG MANAGE BOWEL W/MEDICATION W/ASSISTANCE Description: STG Manage Bowel with Medication with mod I Assistance. Outcome: Progressing   Problem: RH BLADDER ELIMINATION Goal: RH STG MANAGE BLADDER WITH ASSISTANCE Description: STG Manage Bladder With toileting  Assistance Outcome: Progressing   Problem: RH SAFETY Goal: RH STG ADHERE TO SAFETY PRECAUTIONS W/ASSISTANCE/DEVICE Description: STG Adhere to Safety Precautions With cues Assistance/Device. Outcome: Progressing   Problem: RH KNOWLEDGE DEFICIT Goal: RH STG INCREASE KNOWLEDGE OF DIABETES Description: Patient and daughter will be able to manage DM/prediabetes with dietary modifications using handouts and educational resources independently Outcome: Progressing Goal: RH STG INCREASE KNOWLEDGE OF HYPERTENSION Description: Patient and daughter will be able to manage HTN with medications and dietary modifications using handouts and educational resources independently Outcome: Progressing Goal: RH STG INCREASE KNOWLEGDE OF HYPERLIPIDEMIA Description: Patient and daughter will be able to manage HLD with medications and dietary modifications using handouts and educational resources independently Outcome: Progressing Goal: RH STG INCREASE KNOWLEDGE OF STROKE PROPHYLAXIS Description: Patient and daughter will be able to manage secondary risks with medications and dietary modifications using handouts and educational resources independently Outcome: Progressing  Patient not able to understand some of the teaching but patient presents with two daughters who are active in her care and understand teachings that staff gives about patients care.

## 2022-06-01 NOTE — Progress Notes (Signed)
PROGRESS NOTE   Subjective/Complaints:  Pt reports no pain and "not together yet"- pt confused as to location/time  Has telesitter; ate banana but otherwise ~25% of breakfast.    ROS- limited due to cognition Objective:   No results found. No results for input(s): "WBC", "HGB", "HCT", "PLT" in the last 72 hours. Recent Labs    05/31/22 0719  NA 137  K 4.1  CL 103  CO2 22  GLUCOSE 154*  BUN 29*  CREATININE 1.21*  CALCIUM 9.8    Intake/Output Summary (Last 24 hours) at 06/01/2022 1753 Last data filed at 06/01/2022 1500 Gross per 24 hour  Intake 360 ml  Output 1 ml  Net 359 ml        Physical Exam: Vital Signs Blood pressure 124/68, pulse 83, temperature 98.5 F (36.9 C), resp. rate 18, height '5\' 2"'$  (1.575 m), weight 68.3 kg, SpO2 96 %.    General: awake, alert, confused; ate ~25% of breakfast;  NAD HENT:  oropharynx moist CV: regular rate; no JVD Pulmonary: CTA B/L; no W/R/R- good air movement GI: soft, NT, ND, (+)BS Psychiatric: appropriate, but pleasantly confused Neurological: Ox1- not to location/time/year Skin: No evidence of breakdown, no evidence of rash Neurologic: oriented to self only, motor strength is 5/5 in bilateral deltoid, bicep, tricep, grip, hip flexor, knee extensors, ankle dorsiflexor and plantar flexor Left neglect evident.  Cannot assess sensation due to cog def Musculoskeletal: Full range of motion in all 4 extremities. No joint swelling   Assessment/Plan: 1. Functional deficits which require 3+ hours per day of interdisciplinary therapy in a comprehensive inpatient rehab setting. Physiatrist is providing close team supervision and 24 hour management of active medical problems listed below. Physiatrist and rehab team continue to assess barriers to discharge/monitor patient progress toward functional and medical goals  Care Tool:  Bathing    Body parts bathed by patient: Chest,  Abdomen   Body parts bathed by helper: Right arm, Left arm, Front perineal area, Buttocks, Right upper leg, Left upper leg, Right lower leg, Left lower leg, Face     Bathing assist Assist Level: Total Assistance - Patient < 25%     Upper Body Dressing/Undressing Upper body dressing   What is the patient wearing?: Button up shirt    Upper body assist Assist Level: Total Assistance - Patient < 25%    Lower Body Dressing/Undressing Lower body dressing      What is the patient wearing?: Pants, Incontinence brief     Lower body assist Assist for lower body dressing: 2 Helpers     Toileting Toileting    Toileting assist Assist for toileting: 2 Helpers     Transfers Chair/bed transfer  Transfers assist  Chair/bed transfer activity did not occur: Safety/medical concerns  Chair/bed transfer assist level: 2 Helpers     Locomotion Ambulation   Ambulation assist      Assist level: 2 helpers Assistive device: Other (comment) (3 Musketeer) Max distance: 82f   Walk 10 feet activity   Assist     Assist level: 2 helpers Assistive device: Other (comment) (3 Musketeer)   Walk 50 feet activity   Assist Walk 50 feet with 2  turns activity did not occur: Safety/medical concerns         Walk 150 feet activity   Assist Walk 150 feet activity did not occur: Safety/medical concerns         Walk 10 feet on uneven surface  activity   Assist Walk 10 feet on uneven surfaces activity did not occur: Safety/medical concerns         Wheelchair     Assist Is the patient using a wheelchair?: Yes Type of Wheelchair: Manual    Wheelchair assist level: Dependent - Patient 0%      Wheelchair 50 feet with 2 turns activity    Assist        Assist Level: Dependent - Patient 0%   Wheelchair 150 feet activity     Assist      Assist Level: Dependent - Patient 0%   Blood pressure 124/68, pulse 83, temperature 98.5 F (36.9 C), resp. rate  18, height '5\' 2"'$  (1.575 m), weight 68.3 kg, SpO2 96 %. Medical Problem List and Plan: 1. Functional deficits secondary to right thalamic infarct PCA distribution ; Alzheimner's dementia and prior stroke/TIA with left arm weakness which has improved  and left hemianopsia             -patient may shower             -ELOS/Goals: 14-21 days, min assist goals,   Con't CIR- PT, OT and SLP 2.  Antithrombotics: -DVT/anticoagulation:  Pharmaceutical: Lovenox             -antiplatelet therapy: Plavix and aspirin 81 mg (family declines Brilinta) 3. Pain Management: Tylenol as needed 4. Mood/Behavior/Sleep: LCSW to evaluate and provide emotional support             -antipsychotic agents: n/a 5. Neuropsych/cognition: This patient is not capable of making decisions on her own behalf. 6. Skin/Wound Care: Routine skin care checks 7. Fluids/Electrolytes/Nutrition: Routine I's and O's and follow-up chemistries             -heart healthy diet/thin liq; crush meds in pureed Poor fluid intake will recheck BMET in am   8/5- Last Cr 8/4 was 1.21- stable- BUN slightly more elevated at 29- will recheck Monday and if getting worse, might need IVFs 8: Hypertension: continue amlodipine 5 mg daily             -Cozaar 50 mg daily is being held  8/5- BP soft/ok- will con't off Cozaar- con't norvasc Vitals:   06/01/22 1033 06/01/22 1350  BP: 108/84 124/68  Pulse: 79 83  Resp:  18  Temp:  98.5 F (36.9 C)  SpO2:  96%   9: Hyperlipidemia: continue Zetia, Pravachol 10: CAD: stable, no CP 11: Alzheimer's dementia; follows with Dr. Felecia Shelling. Does not tolerate donepezil 12: Diastolic CHF: stable, no SOB or edema 13: Insomnia: continue melatonin>>need to give by 7-7:30 pm nightly- one daughter requests to not awaken pt for turning at night , the other daughter is ok with skin care protocol 14: Left nipple skin change: no sign of infection; follow-up as outpatient 15: Urinary incontinence:              -attempt timed  voids             -check PVR's 16.  Mild leukocytosis, persistent AMS since hospitalization check UA C and S cath spec since pt is incont   8/5- U/A shows moderate leuks/few bacteria/WBCs 21-50- will await Urine Cx- so far 40K e  coli- so not at a point likely needs to be treated unless more symptomatic.    LOS: 4 days A FACE TO FACE EVALUATION WAS PERFORMED  Arlita Buffkin 06/01/2022, 5:53 PM

## 2022-06-01 NOTE — Progress Notes (Signed)
Occupational Therapy Session Note  Patient Details  Name: Katelyn Powers MRN: [id number] Date of Birth: 1931-12-09  Today's Date: 06/01/2022 OT Individual Time: 1117-1202 and 1400-1445 OT Individual Time Calculation (min): 45 min and 45 min    Short Term Goals: Week 1:  OT Short Term Goal 1 (Week 1): Pt will trasnfer to toilet with MOD A and LRAD OT Short Term Goal 2 (Week 1): Pt will locate 3/5 neded ADL items on L of center to demo improved L attention OT Short Term Goal 3 (Week 1): Pt will initate 1/4 steps of UB dressing OT Short Term Goal 4 (Week 1): Pt will complete oral care with MOD cuing for sequencing  Skilled Therapeutic Interventions/Progress Updates:    Visit 1: no c/o pain Pt received in wc with her dtr present.  Pt had on scrub pants and dtr had new pants for pt to put on. Pt alert this session and would frequently say "well I think I can" before start a task.  Focus of session was finding methods of facilitation sit to stand and transfers by tapping into familiar movement patterns that pt is accustomed to.  Ie, sitting EOB to don pants.  After multiple trials of different methods, pt did best by pushing up with B hands on RW to stand. Although this method is not the most preferred, it gave her tactile feedback to compensate for his visual deficits.  Pt was able to stand and pivot with mod A with RW with +2 for safety. Pt resting in wc with belt alarm on and all needs met to prepare for lunch.  Visit 2:  Pain: no c/o pain  Pt much more lethargic this afternoon.  Goals of this session were to work on toileting skills.  Pt needing more assist this afternoon due to lethargy and decreased attention.  Overall she required max A to stand, max of 2 to transfer, total A with toileting.  This process took considerable time as pt moves slowly and needs extra time to process.  Pt was able to void a very small amount of bowel, informed RN. Pt positioned back in wc to rest prior to next  session.  Belt alarm on and all needs met.  Therapy Documentation Precautions:  Precautions Precautions: Fall, Other (comment) Precaution Comments: L inattention, hx of Alzheimer's Restrictions Weight Bearing Restrictions: No    Vital Signs: Therapy Vitals Temp: 98.6 F (37 C) Pulse Rate: 71 Resp: 16 BP: 125/82 Patient Position (if appropriate): Lying Oxygen Therapy SpO2: 98 % O2 Device: Room Air Pain:   ADL: ADL Eating: Moderate assistance Grooming: Supervision/safety (Pt able to complete oral care with close supervision and set-up of toothpaste with moderate VCs for thoroughness. Pt does require maximal assist to comb hair due to decrease alertness at end of session.) Where Assessed-Grooming: Wheelchair, Sitting at sink Upper Body Bathing: Minimal assistance, Moderate cueing (Pt able to participate in bathing all UB parts while supine in bed with moderate verbal and actile cues for sequencing and thoroughness duirng a sponge bath.) Where Assessed-Upper Body Bathing: Bed level Lower Body Bathing: Moderate assistance, Moderate cueing (Pt able to bathe upper portion of B thighs with moderate verbal and tactile prompts. However, pt requires assistance to bathe B feet during a sponge bath at bed level.) Where Assessed-Lower Body Bathing: Bed level Upper Body Dressing: Minimal assistance (Pt able to don a pull-over shirt while seated at EOB with minimal assist to peform clothing adjustments around trunk.) Where Assessed-Upper Body Dressing:  Edge of bed Lower Body Dressing: Maximal assistance (Pt requires maximal assist to don pants at bed level. However, pt able to attempt bridging and participate in pulling pants up with maximal assistance.) Where Assessed-Lower Body Dressing: Bed level Toileting:  ([name] family reports that pt had just been cleaned up with nursing staff prior to OT session and pt reports no need at this time.) Where Assessed-Toileting: Glass blower/designer:  Maximal Print production planner Method: Stand pivot ADL Comments: Pt requires increased time to complete ADLs due to decreased alertness and pt frequently closing eyes or falling asleep throughout ADLs as well as requiring moderate - maximal verbal and tactile prompts for initiation, sequecning, and thoroughness throughout. Pt able to transition to from supine > sittng at EOB with maximal assistance. Pt then able to perform sit <> stand from bed level with maximal assist. Pt requires maximal assist x 1 and minimal assist x 1 to complete a SPT to the R from EOB > w/c.      Therapy/Group: Individual Therapy  [place] 06/01/2022, 7:52 AM

## 2022-06-02 LAB — URINE CULTURE: Culture: 40000 — AB

## 2022-06-02 NOTE — Plan of Care (Signed)
  Problem: Consults Goal: RH STROKE PATIENT EDUCATION Description: See Patient Education module for education specifics  Outcome: Progressing   Problem: RH BOWEL ELIMINATION Goal: RH STG MANAGE BOWEL WITH ASSISTANCE Description: STG Manage Bowel with mod I Assistance. Outcome: Progressing Goal: RH STG MANAGE BOWEL W/MEDICATION W/ASSISTANCE Description: STG Manage Bowel with Medication with mod I Assistance. Outcome: Progressing   Problem: RH BLADDER ELIMINATION Goal: RH STG MANAGE BLADDER WITH ASSISTANCE Description: STG Manage Bladder With toileting  Assistance Outcome: Progressing   Problem: RH SAFETY Goal: RH STG ADHERE TO SAFETY PRECAUTIONS W/ASSISTANCE/DEVICE Description: STG Adhere to Safety Precautions With cues Assistance/Device. Outcome: Progressing   Problem: RH KNOWLEDGE DEFICIT Goal: RH STG INCREASE KNOWLEDGE OF DIABETES Description: Patient and daughter will be able to manage DM/prediabetes with dietary modifications using handouts and educational resources independently Outcome: Progressing Goal: RH STG INCREASE KNOWLEDGE OF HYPERTENSION Description: Patient and daughter will be able to manage HTN with medications and dietary modifications using handouts and educational resources independently Outcome: Progressing Goal: RH STG INCREASE KNOWLEGDE OF HYPERLIPIDEMIA Description: Patient and daughter will be able to manage HLD with medications and dietary modifications using handouts and educational resources independently Outcome: Progressing Goal: RH STG INCREASE KNOWLEDGE OF STROKE PROPHYLAXIS Description: Patient and daughter will be able to manage secondary risks with medications and dietary modifications using handouts and educational resources independently Outcome: Progressing

## 2022-06-02 NOTE — Progress Notes (Signed)
PROGRESS NOTE   Subjective/Complaints:  Pt asleep- staff reports no issues.  Ate 50% of breakfast before fell back asleep.    ROS- limited due to cognition/sedation Objective:   No results found. No results for input(s): "WBC", "HGB", "HCT", "PLT" in the last 72 hours. Recent Labs    05/31/22 0719  NA 137  K 4.1  CL 103  CO2 22  GLUCOSE 154*  BUN 29*  CREATININE 1.21*  CALCIUM 9.8    Intake/Output Summary (Last 24 hours) at 06/02/2022 1324 Last data filed at 06/02/2022 0900 Gross per 24 hour  Intake 480 ml  Output 1 ml  Net 479 ml        Physical Exam: Vital Signs Blood pressure 109/67, pulse 72, temperature 98.2 F (36.8 C), temperature source Oral, resp. rate 18, height '5\' 2"'$  (1.575 m), weight 68.3 kg, SpO2 95 %.     General: sleeping- woke briefly to stimuli, but fell back asleep; NAD HENT: conjugate gaze; oropharynx moist CV: regular rate; no JVD Pulmonary: CTA B/L; no W/R/R- good air movement GI: soft, NT, ND, (+)BS Psychiatric: asleep Neurological: asleep Skin: No evidence of breakdown, no evidence of rash Neurologic: oriented to self only, motor strength is 5/5 in bilateral deltoid, bicep, tricep, grip, hip flexor, knee extensors, ankle dorsiflexor and plantar flexor Left neglect evident.  Cannot assess sensation due to cog def Musculoskeletal: Full range of motion in all 4 extremities. No joint swelling   Assessment/Plan: 1. Functional deficits which require 3+ hours per day of interdisciplinary therapy in a comprehensive inpatient rehab setting. Physiatrist is providing close team supervision and 24 hour management of active medical problems listed below. Physiatrist and rehab team continue to assess barriers to discharge/monitor patient progress toward functional and medical goals  Care Tool:  Bathing    Body parts bathed by patient: Chest, Abdomen   Body parts bathed by helper: Right  arm, Left arm, Front perineal area, Buttocks, Right upper leg, Left upper leg, Right lower leg, Left lower leg, Face     Bathing assist Assist Level: Total Assistance - Patient < 25%     Upper Body Dressing/Undressing Upper body dressing   What is the patient wearing?: Button up shirt    Upper body assist Assist Level: Total Assistance - Patient < 25%    Lower Body Dressing/Undressing Lower body dressing      What is the patient wearing?: Pants, Incontinence brief     Lower body assist Assist for lower body dressing: 2 Helpers     Toileting Toileting    Toileting assist Assist for toileting: 2 Helpers     Transfers Chair/bed transfer  Transfers assist  Chair/bed transfer activity did not occur: Safety/medical concerns  Chair/bed transfer assist level: 2 Helpers     Locomotion Ambulation   Ambulation assist      Assist level: 2 helpers Assistive device: Other (comment) (3 Musketeer) Max distance: 53f   Walk 10 feet activity   Assist     Assist level: 2 helpers Assistive device: Other (comment) (3 Musketeer)   Walk 50 feet activity   Assist Walk 50 feet with 2 turns activity did not occur: Safety/medical concerns  Walk 150 feet activity   Assist Walk 150 feet activity did not occur: Safety/medical concerns         Walk 10 feet on uneven surface  activity   Assist Walk 10 feet on uneven surfaces activity did not occur: Safety/medical concerns         Wheelchair     Assist Is the patient using a wheelchair?: Yes Type of Wheelchair: Manual    Wheelchair assist level: Dependent - Patient 0%      Wheelchair 50 feet with 2 turns activity    Assist        Assist Level: Dependent - Patient 0%   Wheelchair 150 feet activity     Assist      Assist Level: Dependent - Patient 0%   Blood pressure 109/67, pulse 72, temperature 98.2 F (36.8 C), temperature source Oral, resp. rate 18, height '5\' 2"'$  (1.575  m), weight 68.3 kg, SpO2 95 %. Medical Problem List and Plan: 1. Functional deficits secondary to right thalamic infarct PCA distribution ; Alzheimner's dementia and prior stroke/TIA with left arm weakness which has improved  and left hemianopsia             -patient may shower             -ELOS/Goals: 14-21 days, min assist goals,   Continue CIR- PT, OT and SLP  2.  Antithrombotics: -DVT/anticoagulation:  Pharmaceutical: Lovenox             -antiplatelet therapy: Plavix and aspirin 81 mg (family declines Brilinta) 3. Pain Management: Tylenol as needed 4. Mood/Behavior/Sleep: LCSW to evaluate and provide emotional support             -antipsychotic agents: n/a 5. Neuropsych/cognition: This patient is not capable of making decisions on her own behalf. 6. Skin/Wound Care: Routine skin care checks 7. Fluids/Electrolytes/Nutrition: Routine I's and O's and follow-up chemistries             -heart healthy diet/thin liq; crush meds in pureed Poor fluid intake will recheck BMET in am   8/5- Last Cr 8/4 was 1.21- stable- BUN slightly more elevated at 29- will recheck Monday and if getting worse, might need IVFs 8: Hypertension: continue amlodipine 5 mg daily             -Cozaar 50 mg daily is being held  8/5- BP soft/ok- will con't off Cozaar- con't norvasc  8/6- BP somewhat variable/labile- 378H to 885O systolic- con't to monitor trend Vitals:   06/02/22 0912 06/02/22 1321  BP: (!) 154/79 109/67  Pulse: 66 72  Resp:  18  Temp:  98.2 F (36.8 C)  SpO2:  95%   9: Hyperlipidemia: continue Zetia, Pravachol 10: CAD: stable, no CP 11: Alzheimer's dementia; follows with Dr. Felecia Shelling. Does not tolerate donepezil 12: Diastolic CHF: stable, no SOB or edema 13: Insomnia: continue melatonin>>need to give by 7-7:30 pm nightly- one daughter requests to not awaken pt for turning at night , the other daughter is ok with skin care protocol 14: Left nipple skin change: no sign of infection; follow-up as  outpatient 15: Urinary incontinence:              -attempt timed voids             -check PVR's 16.  Mild leukocytosis, persistent AMS since hospitalization check UA C and S cath spec since pt is incont   8/5- U/A shows moderate leuks/few bacteria/WBCs 21-50- will await Urine Cx- so  far 40K e coli- so not at a point likely needs to be treated unless more symptomatic.   8/6- E Coli 40k- will not treat for now- is afebrile and per nursing at baseline   LOS: 5 days A FACE TO FACE EVALUATION WAS PERFORMED  Earline Stiner 06/02/2022, 1:24 PM

## 2022-06-03 DIAGNOSIS — R7989 Other specified abnormal findings of blood chemistry: Secondary | ICD-10-CM

## 2022-06-03 DIAGNOSIS — D72829 Elevated white blood cell count, unspecified: Secondary | ICD-10-CM

## 2022-06-03 LAB — CBC
HCT: 41.6 % (ref 36.0–46.0)
Hemoglobin: 13.9 g/dL (ref 12.0–15.0)
MCH: 29.6 pg (ref 26.0–34.0)
MCHC: 33.4 g/dL (ref 30.0–36.0)
MCV: 88.5 fL (ref 80.0–100.0)
Platelets: 211 10*3/uL (ref 150–400)
RBC: 4.7 MIL/uL (ref 3.87–5.11)
RDW: 13.3 % (ref 11.5–15.5)
WBC: 7.9 10*3/uL (ref 4.0–10.5)
nRBC: 0 % (ref 0.0–0.2)

## 2022-06-03 NOTE — Progress Notes (Signed)
PROGRESS NOTE   Subjective/Complaints:  Eating breakfast this AM. NO new concerns.    ROS- limited due to cognition/sedation Objective:   No results found. Recent Labs    06/03/22 0721  WBC 7.9  HGB 13.9  HCT 41.6  PLT 211   No results for input(s): "NA", "K", "CL", "CO2", "GLUCOSE", "BUN", "CREATININE", "CALCIUM" in the last 72 hours.   Intake/Output Summary (Last 24 hours) at 06/03/2022 1344 Last data filed at 06/03/2022 1330 Gross per 24 hour  Intake 474 ml  Output 0 ml  Net 474 ml         Physical Exam: Vital Signs Blood pressure 109/60, pulse 78, temperature 98.8 F (37.1 C), resp. rate 18, height '5\' 2"'$  (1.575 m), weight 68.3 kg, SpO2 99 %.     General: Alert,  NAD HENT: conjugate gaze; oropharynx moist CV: regular rate; no JVD Pulmonary: CTA B/L; no W/R/R- good air movement GI: soft, NT, ND, (+)BS Psychiatric: Normal affect, pleasant Skin: No evidence of breakdown, no evidence of rash Neurologic:  alert, motor strength is 5/5 in bilateral deltoid, bicep, tricep, grip, hip flexor, knee extensors, ankle dorsiflexor and plantar flexor Left neglect evident.  Cannot assess sensation due to cog def Musculoskeletal: Full range of motion in all 4 extremities. No joint swelling   Assessment/Plan: 1. Functional deficits which require 3+ hours per day of interdisciplinary therapy in a comprehensive inpatient rehab setting. Physiatrist is providing close team supervision and 24 hour management of active medical problems listed below. Physiatrist and rehab team continue to assess barriers to discharge/monitor patient progress toward functional and medical goals  Care Tool:  Bathing    Body parts bathed by patient: Right arm, Left arm, Abdomen, Chest, Right upper leg, Left upper leg, Right lower leg, Left lower leg   Body parts bathed by helper: Front perineal area     Bathing assist Assist Level: Maximal  Assistance - Patient 24 - 49%     Upper Body Dressing/Undressing Upper body dressing   What is the patient wearing?: Pull over shirt    Upper body assist Assist Level: Minimal Assistance - Patient > 75%    Lower Body Dressing/Undressing Lower body dressing      What is the patient wearing?: Pants, Incontinence brief     Lower body assist Assist for lower body dressing: Dependent - Patient 0%     Toileting Toileting    Toileting assist Assist for toileting: 2 Helpers     Transfers Chair/bed transfer  Transfers assist  Chair/bed transfer activity did not occur: Safety/medical concerns  Chair/bed transfer assist level: Maximal Assistance - Patient 25 - 49%     Locomotion Ambulation   Ambulation assist      Assist level: 2 helpers Assistive device: Other (comment) (3 Musketeer) Max distance: 47f   Walk 10 feet activity   Assist     Assist level: 2 helpers Assistive device: Other (comment) (3 Musketeer)   Walk 50 feet activity   Assist Walk 50 feet with 2 turns activity did not occur: Safety/medical concerns         Walk 150 feet activity   Assist Walk 150 feet activity did not  occur: Safety/medical concerns         Walk 10 feet on uneven surface  activity   Assist Walk 10 feet on uneven surfaces activity did not occur: Safety/medical concerns         Wheelchair     Assist Is the patient using a wheelchair?: Yes Type of Wheelchair: Manual    Wheelchair assist level: Dependent - Patient 0%      Wheelchair 50 feet with 2 turns activity    Assist        Assist Level: Dependent - Patient 0%   Wheelchair 150 feet activity     Assist      Assist Level: Dependent - Patient 0%   Blood pressure 109/60, pulse 78, temperature 98.8 F (37.1 C), resp. rate 18, height '5\' 2"'$  (1.575 m), weight 68.3 kg, SpO2 99 %. Medical Problem List and Plan: 1. Functional deficits secondary to right thalamic infarct PCA  distribution ; Alzheimner's dementia and prior stroke/TIA with left arm weakness which has improved  and left hemianopsia             -patient may shower             -ELOS/Goals: 14-21 days, min assist goals,   Continue CIR- PT, OT and SLP 2.  Antithrombotics: -DVT/anticoagulation:  Pharmaceutical: Lovenox             -antiplatelet therapy: Plavix and aspirin 81 mg (family declines Brilinta) 3. Pain Management: Tylenol as needed 4. Mood/Behavior/Sleep: LCSW to evaluate and provide emotional support             -antipsychotic agents: n/a 5. Neuropsych/cognition: This patient is not capable of making decisions on her own behalf. 6. Skin/Wound Care: Routine skin care checks 7. Fluids/Electrolytes/Nutrition: Routine I's and O's and follow-up chemistries             -heart healthy diet/thin liq; crush meds in pureed Poor fluid intake will recheck BMET in am   8/5- Last Cr 8/4 was 1.21- stable- BUN slightly more elevated at 29- will recheck Monday and if getting worse, might need IVFs  8/7 recheck BMET tomorrow 8: Hypertension: continue amlodipine 5 mg daily             -Cozaar 50 mg daily is being held  8/5- BP soft/ok- will con't off Cozaar- con't norvasc  8/7 Well controlled overall, continue to monitor trend Vitals:   06/03/22 0945 06/03/22 1331  BP: (!) 113/59 109/60  Pulse: 77 78  Resp:  18  Temp:  98.8 F (37.1 C)  SpO2:  99%   9: Hyperlipidemia: continue Zetia, Pravachol 10: CAD: stable, no CP 11: Alzheimer's dementia; follows with Dr. Felecia Shelling. Does not tolerate donepezil 12: Diastolic CHF: stable, no SOB or edema 13: Insomnia: continue melatonin>>need to give by 7-7:30 pm nightly- one daughter requests to not awaken pt for turning at night , the other daughter is ok with skin care protocol 14: Left nipple skin change: no sign of infection; follow-up as outpatient 15: Urinary incontinence:              -attempt timed voids             -check PVR's 16.  Mild leukocytosis,  persistent AMS since hospitalization check UA C and S cath spec since pt is incont   8/5- U/A shows moderate leuks/few bacteria/WBCs 21-50- will await Urine Cx- so far 40K e coli- so not at a point likely needs to be treated unless more  symptomatic.   8/6- E Coli 40k- will not treat for now- is afebrile and per nursing at baseline  8/7 CBC 7/9 today, stable   LOS: 6 days A FACE TO [place] 06/03/2022, 1:44 PM

## 2022-06-03 NOTE — Progress Notes (Signed)
Physical Therapy Session Note  Patient Details  Name: Katelyn Powers MRN: [id number] Date of Birth: [birth year]  Today's Date: 06/03/2022 PT Individual Time: 1016-1102 PT Individual Time Calculation (min): 46 min   Short Term Goals: Week 1:  PT Short Term Goal 1 (Week 1): Pt will perform supine<>sit with mod assist using bed features PT Short Term Goal 2 (Week 1): Pt will perform sit<>stand transfers using LRAD with max assist of 1 PT Short Term Goal 3 (Week 1): Pt will perform bed<>chair transfers using LRAD with mod assist without lift equipment PT Short Term Goal 4 (Week 1): Pt will ambulate at least 49f using LRAD with +2 mod assist Week 2:     Skilled Therapeutic Interventions/Progress Updates:  Patient seated upright in w/c on entrance to room. Patient alert and agreeable to PT session. [name] dtr and granddtr in room. Pt busy coloring in book.   Patient with no pain complaint throughout session. Demos difficulty in turning head to L and looking to see items to pt's L side. Unable to locate w/c when placed to L at distance and pt continuously looks to ceiling.   Therapeutic Activity: Transfers: Patient performed sit<>stand and stand pivot transfers throughout session with 2 person HHA and MinA up to Min/ ModA with appropriate vc/ tc to initiate. Provided verbal cues for initiation, sequence of pivot stepping.  Gait Training:  Patient ambulated 268 x2 using 2 person HHA with MMarcus  Provided max vc/ tc for step initiation intermittently throughout bouts.   Neuromuscular Re-ed: NMR facilitated during session with focus on standing balance. Pt guided in toe touches to colored discs placed on floor just anterior to BOS. Difficulty with L foot but able to perform with minimal assistance in weight shift and max vc.  NMR performed for improvements in motor control and coordination, balance, sequencing, judgement, and self confidence/ efficacy in performing all aspects of mobility at highest level  of independence.   Patient seated  in w/c at end of session with brakes locked, belt alarm set, and all needs within reach. Dtr to wash and fix pt's hair between sessions.    Therapy Documentation Precautions:  Precautions Precautions: Fall, Other (comment) Precaution Comments: L inattention, hx of Alzheimer's Restrictions Weight Bearing Restrictions: No General:   Vital Signs: Therapy Vitals Temp: 98.8 F (37.1 C) Pulse Rate: 78 Resp: 18 BP: 109/60 Oxygen Therapy SpO2: 99 % Pain:  No pain complaint this session.   Therapy/Group: Individual Therapy  JAlger SimonsPT, DPT, CSRS 06/03/2022, 4:54 PM

## 2022-06-03 NOTE — Progress Notes (Signed)
Occupational Therapy Session Note  Patient Details  Name: Katelyn Powers MRN: [id number] Date of Birth: October 19, 1932  Today's Date: 06/03/2022 OT Individual Time: 3009-2330 OT Individual Time Calculation (min): 75 min    Short Term Goals: Week 1:  OT Short Term Goal 1 (Week 1): Pt will trasnfer to toilet with MOD A and LRAD OT Short Term Goal 2 (Week 1): Pt will locate 3/5 neded ADL items on L of center to demo improved L attention OT Short Term Goal 3 (Week 1): Pt will initate 1/4 steps of UB dressing OT Short Term Goal 4 (Week 1): Pt will complete oral care with MOD cuing for sequencing  Skilled Therapeutic Interventions/Progress Updates:    Pt received supine with no c/o pain, agreeable to OT session. Pt oriented only to self, little to no working memory, requiring frequent cueing but also increased time to initiate/process/sequence. Max multimodal cueing for all command initiation. Max A to come EOB with poor body awareness. Once EOB, she was able to maintain static sitting balance with no physical assist. She stood from EOB with max A initially, both UE reaching for the w/c. She was able to judge that this method would not work and returned to sitting, mod cueing for improved hand placement. Pt then able to complete stand pivot with max A toward the R. Oral hygiene with mod cueing to initiate including HOH but once initiated and with increased time/removal of therapist distraction pt able to finish task with good thoroughness. She required frequent cueing to keep her eyes open during session. She donned a shirt with min A to pull down in the back and mod A for thorough UB bathing. Motor planning deficits present. She stood with max A to power up with BUE on the sink however once standing she was able to maintain a static stand with only CGA! She required total A for peri cleansing and donning of pants and brief. Incontinent of urine in brief and unaware. Good attention to the L during bathing. Pt  required mod multimodal cueing for initiation of eating breakfast. She required frequent redirection from using fork to scrape her teeth while chewing. Music was turned on to assist with arousal- did increase pt ability to keep eyes open and maintain sustained attention on eating breakfast. Pt was left sitting up in the w/c with all needs met chair alarm set. NT notified of pt with breakfast still present (intermittent supervision).    Therapy Documentation Precautions:  Precautions Precautions: Fall, Other (comment) Precaution Comments: L inattention, hx of Alzheimer's Restrictions Weight Bearing Restrictions: No  Therapy/Group: Individual Therapy  Curtis Sites 06/03/2022, 6:37 AM

## 2022-06-03 NOTE — Progress Notes (Signed)
Occupational Therapy Session Note  Patient Details  Name: Katelyn Powers MRN: [id number] Date of Birth: [birth year]  Today's Date: 06/03/2022 OT Individual Time: 1350-1500 OT Individual Time Calculation (min): 70 min    Short Term Goals: Week 1:  OT Short Term Goal 1 (Week 1): Pt will trasnfer to toilet with MOD A and LRAD OT Short Term Goal 2 (Week 1): Pt will locate 3/5 neded ADL items on L of center to demo improved L attention OT Short Term Goal 3 (Week 1): Pt will initate 1/4 steps of UB dressing OT Short Term Goal 4 (Week 1): Pt will complete oral care with MOD cuing for sequencing  Skilled Therapeutic Interventions/Progress Updates:    Pt received in w/c looking at a church newsletter. Daughter and granddtr present. Pt able to call out a few words on the page of newsletter but not able to follow sentence in order.  Pt taken to gym to work on visual tracking to her left with a color match game. She was able to identify colors but not able to follow 1 step direction of game.  Had her practice holding bag for game pieces in L hand while she moved pieces with R hand into bag. Bag in L visual field and pt needed constant hand over hand guidance to follow through. Had pt try to place clothes pins onto dowels. With light resistance pins, pt able to place them with visual cues of how to pinch clothes pins.    Pt then positioned at side of parallel bars. Pt able to pull to stand with min A and tolerated standing for 5 min while I cued her to dance with leaning hips side to side. Pt with a L lean and mod A to help her achieve midline.    For UE ROM had pt at sci fit with no resistance.  Pt needed hand over hand guidance to push peddles but then I could remove my hands for her to continue pushing pedals for 10-15 seconds on her own. Repeated this for 5 minutes. Pt stated "oh this is a lot".  Pt taken back to room and assisted to bed with A from nurse tech.  Pt was a max A squat pivot and total to  move to supine.   Daughter stated pt did fold clothes often so that would be a could functional task for her to work on.   Nursing with pt.     Therapy Documentation Precautions:  Precautions Precautions: Fall, Other (comment) Precaution Comments: L inattention, hx of Alzheimer's Restrictions Weight Bearing Restrictions: No    Vital Signs: Therapy Vitals Pulse Rate: 77 BP: (!) 113/59 Pain: Pain Assessment Pain Scale: 0-10 Pain Score: 0-No pain ADL: ADL Eating: Moderate assistance Grooming: Supervision/safety (Pt able to complete oral care with close supervision and set-up of toothpaste with moderate VCs for thoroughness. Pt does require maximal assist to comb hair due to decrease alertness at end of session.) Where Assessed-Grooming: Wheelchair, Sitting at sink Upper Body Bathing: Minimal assistance, Moderate cueing (Pt able to participate in bathing all UB parts while supine in bed with moderate verbal and actile cues for sequencing and thoroughness duirng a sponge bath.) Where Assessed-Upper Body Bathing: Bed level Lower Body Bathing: Moderate assistance, Moderate cueing (Pt able to bathe upper portion of B thighs with moderate verbal and tactile prompts. However, pt requires assistance to bathe B feet during a sponge bath at bed level.) Where Assessed-Lower Body Bathing: Bed level Upper Body Dressing: Minimal assistance (  Pt able to don a pull-over shirt while seated at EOB with minimal assist to peform clothing adjustments around trunk.) Where Assessed-Upper Body Dressing: Edge of bed Lower Body Dressing: Maximal assistance (Pt requires maximal assist to don pants at bed level. However, pt able to attempt bridging and participate in pulling pants up with maximal assistance.) Where Assessed-Lower Body Dressing: Bed level Toileting:  ([name] family reports that pt had just been cleaned up with nursing staff prior to OT session and pt reports no need at this time.) Where  Assessed-Toileting: Glass blower/designer: Maximal Print production planner Method: Stand pivot ADL Comments: Pt requires increased time to complete ADLs due to decreased alertness and pt frequently closing eyes or falling asleep throughout ADLs as well as requiring moderate - maximal verbal and tactile prompts for initiation, sequecning, and thoroughness throughout. Pt able to transition to from supine > sittng at EOB with maximal assistance. Pt then able to perform sit <> stand from bed level with maximal assist. Pt requires maximal assist x 1 and minimal assist x 1 to complete a SPT to the R from EOB > w/c.        Therapy/Group: Individual Therapy  [place] 06/03/2022, 12:20 PM

## 2022-06-03 NOTE — Plan of Care (Signed)
  Problem: Consults Goal: RH STROKE PATIENT EDUCATION Description: See Patient Education module for education specifics  Outcome: Progressing   Problem: RH BOWEL ELIMINATION Goal: RH STG MANAGE BOWEL WITH ASSISTANCE Description: STG Manage Bowel with mod I Assistance. Outcome: Progressing Goal: RH STG MANAGE BOWEL W/MEDICATION W/ASSISTANCE Description: STG Manage Bowel with Medication with mod I Assistance. Outcome: Progressing   Problem: RH BLADDER ELIMINATION Goal: RH STG MANAGE BLADDER WITH ASSISTANCE Description: STG Manage Bladder With toileting  Assistance Outcome: Progressing   Problem: RH SAFETY Goal: RH STG ADHERE TO SAFETY PRECAUTIONS W/ASSISTANCE/DEVICE Description: STG Adhere to Safety Precautions With cues Assistance/Device. Outcome: Progressing   Problem: RH KNOWLEDGE DEFICIT Goal: RH STG INCREASE KNOWLEDGE OF DIABETES Description: Patient and daughter will be able to manage DM/prediabetes with dietary modifications using handouts and educational resources independently Outcome: Progressing Goal: RH STG INCREASE KNOWLEDGE OF HYPERTENSION Description: Patient and daughter will be able to manage HTN with medications and dietary modifications using handouts and educational resources independently Outcome: Progressing Goal: RH STG INCREASE KNOWLEGDE OF HYPERLIPIDEMIA Description: Patient and daughter will be able to manage HLD with medications and dietary modifications using handouts and educational resources independently Outcome: Progressing Goal: RH STG INCREASE KNOWLEDGE OF STROKE PROPHYLAXIS Description: Patient and daughter will be able to manage secondary risks with medications and dietary modifications using handouts and educational resources independently Outcome: Progressing

## 2022-06-04 DIAGNOSIS — E785 Hyperlipidemia, unspecified: Secondary | ICD-10-CM

## 2022-06-04 LAB — BASIC METABOLIC PANEL
Anion gap: 10 (ref 5–15)
BUN: 27 mg/dL — ABNORMAL HIGH (ref 8–23)
CO2: 25 mmol/L (ref 22–32)
Calcium: 9.9 mg/dL (ref 8.9–10.3)
Chloride: 104 mmol/L (ref 98–111)
Creatinine, Ser: 1.13 mg/dL — ABNORMAL HIGH (ref 0.44–1.00)
GFR, Estimated: 47 mL/min — ABNORMAL LOW (ref >60)
Glucose, Bld: 124 mg/dL — ABNORMAL HIGH (ref 70–99)
Potassium: 4.3 mmol/L (ref 3.5–5.1)
Sodium: 139 mmol/L (ref 135–145)

## 2022-06-04 NOTE — Progress Notes (Signed)
Physical Therapy Session Note  Patient Details  Name: Katelyn Powers MRN: [id number] Date of Birth: 23-Feb-1932  Today's Date: 06/04/2022 PT Individual Time: 0933-1000 PT Individual Time Calculation (min): 27 min   Short Term Goals: Week 1:  PT Short Term Goal 1 (Week 1): Pt will perform supine<>sit with mod assist using bed features PT Short Term Goal 2 (Week 1): Pt will perform sit<>stand transfers using LRAD with max assist of 1 PT Short Term Goal 3 (Week 1): Pt will perform bed<>chair transfers using LRAD with mod assist without lift equipment PT Short Term Goal 4 (Week 1): Pt will ambulate at least 60f using LRAD with +2 mod assist  Skilled Therapeutic Interventions/Progress Updates:    Pt received sitting in w/c as hand-off from OT and pt appears agreeable to continue with this therapy session.  Transported to/from gym in w/c for time management and energy conservation. Pt continues to demonstrate delayed processing and L inattention although this has improved since initial evaluation with pt visually scanning L to look at therapist. Pt requires motivational cuing to initiate coming to stand from seat requiring increased time and hand-over-hand facilitation to place L hand to push up from w/c armrest - requires 3x cuing prior to truly initiating this due to pt stating she doesn't want to stand or walk, but is agreeable with encouragement. Requires mod assist to bring trunk forward and then lift hips into standing followed by hand-over-hand facilitation to move L hand from w/c armrest to RW. Gait training 173f+ 1379fsing RW with light mod assist and +2 w/c follow for safety; however, believe pt would have benefited from having a visual chair target or item to walk towards to increase walking distance - pt requires mod assist for balance while facilitating R/L weight shifting to initiate taking a step and assist to prevent pt from pushing AD too far forward outside her BOS. Transported back to room  and pt starting to close her eyes while sitting in w/c at this time, but she reports she is okay. Pt left seated in w/c with needs in reach, seat belt alarm on, and telesitter in place.   Therapy Documentation Precautions:  Precautions Precautions: Fall, Other (comment) Precaution Comments: L inattention, hx of Alzheimer's Restrictions Weight Bearing Restrictions: No   Pain:  No reports of pain throughout session.   Therapy/Group: Individual Therapy  CarTawana ScalePT, DPT, NCS, CSRS 06/04/2022, 7:59 AM

## 2022-06-04 NOTE — Progress Notes (Signed)
PROGRESS NOTE   Subjective/Complaints:  Pt in bed this am. No concerns or complaints today.    ROS- limited due to cognition/sedation Objective:   No results found. Recent Labs    06/03/22 0721  WBC 7.9  HGB 13.9  HCT 41.6  PLT 211    Recent Labs    06/04/22 0635  NA 139  K 4.3  CL 104  CO2 25  GLUCOSE 124*  BUN 27*  CREATININE 1.13*  CALCIUM 9.9     Intake/Output Summary (Last 24 hours) at 06/04/2022 1458 Last data filed at 06/04/2022 1322 Gross per 24 hour  Intake 474 ml  Output --  Net 474 ml         Physical Exam: Vital Signs Blood pressure 117/66, pulse 82, temperature 98.4 F (36.9 C), temperature source Oral, resp. rate 17, height '5\' 2"'$  (1.575 m), weight 68.3 kg, SpO2 99 %.     General: Alert,  NAD HENT: conjugate gaze; oropharynx moist CV: regular rate; no JVD Pulmonary: CTA B/L; no W/R/R- good air movement GI: soft, NT, ND, (+)BS Psychiatric: Normal affect, pleasant Skin: No evidence of breakdown, no evidence of rash Neurologic:  alert, motor strength is 5/5 in bilateral deltoid, bicep, tricep, grip, hip flexor, knee extensors, ankle dorsiflexor and plantar flexor Left neglect evident.  Cannot assess sensation due to cog def Musculoskeletal: Full range of motion in all 4 extremities. No joint swelling   Assessment/Plan: 1. Functional deficits which require 3+ hours per day of interdisciplinary therapy in a comprehensive inpatient rehab setting. Physiatrist is providing close team supervision and 24 hour management of active medical problems listed below. Physiatrist and rehab team continue to assess barriers to discharge/monitor patient progress toward functional and medical goals  Care Tool:  Bathing    Body parts bathed by patient: Right arm, Left arm, Abdomen, Chest, Right upper leg, Left upper leg, Right lower leg, Left lower leg   Body parts bathed by helper: Front perineal  area     Bathing assist Assist Level: Maximal Assistance - Patient 24 - 49%     Upper Body Dressing/Undressing Upper body dressing   What is the patient wearing?: Pull over shirt    Upper body assist Assist Level: Minimal Assistance - Patient > 75%    Lower Body Dressing/Undressing Lower body dressing      What is the patient wearing?: Pants, Incontinence brief     Lower body assist Assist for lower body dressing: Dependent - Patient 0%     Toileting Toileting    Toileting assist Assist for toileting: 2 Helpers     Transfers Chair/bed transfer  Transfers assist  Chair/bed transfer activity did not occur: Safety/medical concerns  Chair/bed transfer assist level: Maximal Assistance - Patient 25 - 49%     Locomotion Ambulation   Ambulation assist      Assist level: 2 helpers Assistive device: Other (comment) (3 Musketeer) Max distance: 67f   Walk 10 feet activity   Assist     Assist level: 2 helpers Assistive device: Other (comment) (3 Musketeer)   Walk 50 feet activity   Assist Walk 50 feet with 2 turns activity did not occur: Safety/medical  concerns         Walk 150 feet activity   Assist Walk 150 feet activity did not occur: Safety/medical concerns         Walk 10 feet on uneven surface  activity   Assist Walk 10 feet on uneven surfaces activity did not occur: Safety/medical concerns         Wheelchair     Assist Is the patient using a wheelchair?: Yes Type of Wheelchair: Manual    Wheelchair assist level: Dependent - Patient 0%      Wheelchair 50 feet with 2 turns activity    Assist        Assist Level: Dependent - Patient 0%   Wheelchair 150 feet activity     Assist      Assist Level: Dependent - Patient 0%   Blood pressure 117/66, pulse 82, temperature 98.4 F (36.9 C), temperature source Oral, resp. rate 17, height '5\' 2"'$  (1.575 m), weight 68.3 kg, SpO2 99 %. Medical Problem List and  Plan: 1. Functional deficits secondary to right thalamic infarct PCA distribution ; Alzheimner's dementia and prior stroke/TIA with left arm weakness which has improved  and left hemianopsia             -patient may shower             -ELOS/Goals: 14-21 days, min assist goals,   Continue CIR- PT, OT and SLP 2.  Antithrombotics: -DVT/anticoagulation:  Pharmaceutical: Lovenox             -antiplatelet therapy: Plavix and aspirin 81 mg (family declines Brilinta) 3. Pain Management: Tylenol as needed 4. Mood/Behavior/Sleep: LCSW to evaluate and provide emotional support             -antipsychotic agents: n/a 5. Neuropsych/cognition: This patient is not capable of making decisions on her own behalf. 6. Skin/Wound Care: Routine skin care checks 7. Fluids/Electrolytes/Nutrition: Routine I's and O's and follow-up chemistries             -heart healthy diet/thin liq; crush meds in pureed Poor fluid intake will recheck BMET in am   8/5- Last Cr 8/4 was 1.21- stable- BUN slightly more elevated at 29- will recheck Monday and if getting worse, might need IVFs  8/7 Scr down to 1.13, BUN 27, continue to encourage oral fluids 8: Hypertension: continue amlodipine 5 mg daily             -Cozaar 50 mg daily is being held  8/5- BP soft/ok- will con't off Cozaar- con't norvasc  8/8 Overall well controlled, monitor trend Vitals:   06/04/22 0458 06/04/22 1427  BP: (!) 151/74 117/66  Pulse: 72 82  Resp: 15 17  Temp: 97.9 F (36.6 C) 98.4 F (36.9 C)  SpO2: 97% 99%   9: Hyperlipidemia: continue Zetia, Pravachol, heart healthy diet 10: CAD: stable, no CP 11: Alzheimer's dementia; follows with Dr. Felecia Shelling. Does not tolerate donepezil 12: Diastolic CHF: stable, no SOB or edema 13: Insomnia: continue melatonin>>need to give by 7-7:30 pm nightly- one daughter requests to not awaken pt for turning at night , the other daughter is ok with skin care protocol 14: Left nipple skin change: no sign of infection;  follow-up as outpatient 15: Urinary incontinence:              -attempt timed voids             -check PVR's 16.  Mild leukocytosis, persistent AMS since hospitalization check UA C and  S cath spec since pt is incont   8/5- U/A shows moderate leuks/few bacteria/WBCs 21-50- will await Urine Cx- so far 40K e coli- so not at a point likely needs to be treated unless more symptomatic.   8/6- E Coli 40k- will not treat for now- is afebrile and per nursing at baseline  8/7 CBC 7.9 today, stable   LOS: 7 days A FACE TO FACE EVALUATION WAS PERFORMED  Jennye Boroughs 06/04/2022, 2:58 PM

## 2022-06-04 NOTE — Progress Notes (Signed)
Occupational Therapy Session Note  Patient Details  Name: Katelyn Powers MRN: [id number] Date of Birth: 1932/03/09  Today's Date: 06/04/2022 OT Individual Time: 2694-8546 & 2703 - 1503 OT Individual Time Calculation (min): 62 min & 36 min    Short Term Goals: Week 1:  OT Short Term Goal 1 (Week 1): Pt will trasnfer to toilet with MOD A and LRAD OT Short Term Goal 2 (Week 1): Pt will locate 3/5 neded ADL items on L of center to demo improved L attention OT Short Term Goal 3 (Week 1): Pt will initate 1/4 steps of UB dressing OT Short Term Goal 4 (Week 1): Pt will complete oral care with MOD cuing for sequencing  First Session (08:31 - 09:33) - Skilled Therapeutic Interventions/Progress Updates:  Pt awake in bed with nursing staff & NT present in room to provide medication and assist for eating breakfast upon OT arrival to the room. Pt reports, "Did the pastor at the church get whatever?" Pt in agreement for OT session.   Therapy Documentation Precautions:  Precautions Precautions: Fall, Other (comment) Precaution Comments: L inattention, hx of Alzheimer's Restrictions Weight Bearing Restrictions: No Vital Signs: Please see "Flowsheet" for most recent vitals charted by nursing staff.  Pain: Pain Assessment Pain Scale: 0-10 Pain Score: 0-No pain  ADL: Eating: Maximal assistance (NT providing maximal assistance for breakfast upon OT arrival to room. Pt requires increased assistance due to decreased alertness (eyes closed for majority of session despite cueing).) Where Assessed-Eating: Bed level Grooming: Moderate assistance (Pt requires moderate assistance for facial hygiene for thoroughness while seated in the w/c at the sink. However, pt able to perform oral care with CGA and moderate verbal and tactile cues for initiation, sequencing, and continuation of task. Pt requires maximal assistance for thoroughness with applying make-up due to decrease initiation, alertness, and command  following.) Where Assessed-Grooming: Wheelchair, Sitting at sink Upper Body Bathing: Minimal assistance, Moderate cueing (Pt able to participate in bathing all UB parts while supine in bed with moderate verbal and actile cues for sequencing and thoroughness duirng a sponge bath.) Where Assessed-Upper Body Bathing:  (Pt declines sponge bath at this time.) Where Assessed-Lower Body Bathing: Bed level Upper Body Dressing: Maximal assistance (Pt requires maximal assistance to don a butt-up shirt while seated at EOB. Pt demo's decreased alertness and requires maximal prompts for initiation and participation in task. Pt able to line up buttons, however, unable to start buttoning at this time.) Where Assessed-Upper Body Dressing: Edge of bed Lower Body Dressing: Maximal assistance (Pt requires maximal assistance to don pants at bed level. Pt able to elevate BLE with tactile and verbal prompts for assist to thread. However, pt requires maximal assist for clothing management and rolling to provide assist to pull pants up.) Where Assessed-Lower Body Dressing: Bed level Toileting:  (Pt declines need. Briefs checked and remained dry throughout session.) ADL Comments: Pt requires increased time to complete ADLs due to decreased alertness and requiring maximal prompts to open eyes, initiate particiaption in tasks, and continuation of task. Pt requires maximal assist for supine > sit transfer with R side exit out of bed. Pt then requires maximal assist with blocking of LLE to perform SPT to the R from EOB > w/c. Orientation questions asked and pt disoriented to year, place, and situation.   Pt requested to stay in the w/c at end of session. Pt left sitting comfortably in the w/c with personal belongings and call light within reach, belt alarm placed and activated,  and comfort needs attended to. PT entering room as OT left.    --------------------------------------------------------------------------------------------------------------------------------------------------------------------------------------------------------------------------  Second Session (1427 - 1503) - Skilled Therapeutic Interventions/Progress Updates:  Pt awake seated in w/c with daughter upon OT arrival to the room. Pt reports, "I think my sister is here." Pt in agreement for OT session.   Therapy Documentation Pain:  06/04/22 1427  Pain Assessment  Pain Scale 0-10  Pain Score 0   Functional Mobility & Sitting Balance: Pt participates in therapeutic activity in order to improve sitting balance, attention to task, and visual scanning skills which are needed to improve independence with ADL's and functional mobility. Pt able to complete a SPT to the R form w/c <> edge of mat with maximal assistance and blocking of LLE for safety. Once seated at edge of mat, pt able to maintain unsupported sitting balance x ~15 minutes with close supervision and minimal lateral leaning to the R while participating in a fine motor and visual therapeutic activity to color (daughter reports this is a leisure activity for pt that pt enjoys). Pt demo's no LOB while sitting at edge of mat.   Therapeutic Activity: While participating in visual perceptual and fine motor coordination task of coloring in order to improve attention to task, awareness to L side, simple cognitive skills, and initiation with a preferred activity. Pt able to use R hand to grasp crayons with a loose grasp and light pressure on the crayon when coloring. Pt picks 1 color (blue) and uses this color to color the entire area with frequent deviations from the lines. OT placed crayons and box in pt's L visual field with pt unable to perform visual pursuits into L visual field to locate crayons even with a head turn. Pt only able to perform visual pursuits to midline, however, unable to perform ocular  motor movements to then progress into L visual field. OT encourages pt to participate in cognitive task for color recognition with pt able to accurately locate and report 1/3 colors (~30% accuracy). Pt unable to locate crayons held in L visual field while participating in color recognition. When OT provides instructions to change colors, pt unable to initiate and requires maximal hand-over-hand assistance to initiate changing colors. During 15 minutes, pt demo's significant L neglect of the coloring sheet as indicated by pt coloring <25% of the picture and no attempts to color in the L side of the paper. Pt consistently colored in the R 1/4 portion of the paper that was strategically placed in pt's visual fields to encourage attention to L side. Pt requires hand-over-hand assistance to improve bilateral coordination by using L hand to stabilize the paper while using R hand to color. Pt then noted demo L neglect as pt left L hand progressively slide toward the pt causing the paper to fall off of the table and requires maximal hand-over-hand assistance to re-position. Pt noted to attempt to color table 3-4x during session with pt reporting "I don't know" for reasoning for this behaviors and requires maximal assistance to redirect. Pt then transferred back into w/c at end of therapeutic activity. Upon return to the room, pt's daughter reports that pt has consistently been performing leisure activity with coloring by only using 1 color throughout the entire activity prior to CVA. [name] daughter reports that pt has demo'd difficulty with changing colors or coloring pictures more realistic colors for some time and suspects this could be related to dx in PMH.   Pt returned to bed at end of  session. Pt left resting comfortably in bed with personal belongings and call light within reach, daughter present in room, bed alarm on and activated, bed in low position, 3 bed rails up, and comfort needs attended to.    Therapy/Group: Individual Therapy  Barbee Shropshire 06/04/2022, 4:40 PM

## 2022-06-04 NOTE — Progress Notes (Signed)
Occupational Therapy Session Note  Patient Details  Name: Katelyn Powers MRN: [id number] Date of Birth: 1931-11-16  Today's Date: 06/04/2022 OT Individual Time: 5573-2202 OT Individual Time Calculation (min): 48 min    Short Term Goals: Week 1:  OT Short Term Goal 1 (Week 1): Pt will trasnfer to toilet with MOD A and LRAD OT Short Term Goal 2 (Week 1): Pt will locate 3/5 neded ADL items on L of center to demo improved L attention OT Short Term Goal 3 (Week 1): Pt will initate 1/4 steps of UB dressing OT Short Term Goal 4 (Week 1): Pt will complete oral care with MOD cuing for sequencing  Skilled Therapeutic Interventions/Progress Updates:  Pt greeted seated in w/c, pt  agreeable to OT intervention. Pt pleasantly confused throughout session needed MAX multimodal cues and increased time to follow commands. Total A transport to gym where pt completed various therapeutic activities to work on functional transfers, visual scanning and LUE coordination/motor planning:    -Dynamic reaching from w/c level with pt instructed to use LUE to reach to window to remove squigz, pt needed total hand over hand assist to initiate task but able to remove squigz once guided towards object. Pt easily distracted during reach trials needing MAX cues to stay on task, pt pleasantly jabbering but mostly word salad. Emphasis on scanning to L side to reach for squigz with pt presenting with heavy R sided gaze   - pt completed x2 squat pivot transfers from EOM<>w/c to R and L side with pt needed MOD A +2, MAX A +1. Pt with decreased ability to shift weight to L side d/t L inattention.   Pt transported back to room in w/c with total A where pt left up in w/c with alarm belt activated, all needs within reach and family member present.                                 Therapy Documentation Precautions:  Precautions Precautions: Fall, Other (comment) Precaution Comments: L inattention, hx of  Alzheimer's Restrictions Weight Bearing Restrictions: No  Pain: no pain reported during session     Therapy/Group: Individual Therapy  Precious Haws 06/04/2022, 3:45 PM

## 2022-06-05 DIAGNOSIS — R339 Retention of urine, unspecified: Secondary | ICD-10-CM

## 2022-06-05 LAB — URINALYSIS, ROUTINE W REFLEX MICROSCOPIC
Bilirubin Urine: NEGATIVE
Glucose, UA: NEGATIVE mg/dL
Hgb urine dipstick: NEGATIVE
Ketones, ur: NEGATIVE mg/dL
Leukocytes,Ua: NEGATIVE
Nitrite: NEGATIVE
Protein, ur: 100 mg/dL — AB
Specific Gravity, Urine: 1.017 (ref 1.005–1.030)
pH: 5 (ref 5.0–8.0)

## 2022-06-05 NOTE — Progress Notes (Signed)
Physical Therapy treatment Note  Patient Details  Name: Katelyn Powers MRN: [id number] Date of Birth: 1932-01-28    Today's Date: 06/05/2022 Treatment time: 1515-1530 15 min  PT Short Term Goals Week 1:  PT Short Term Goal 1 (Week 1): Pt will perform supine<>sit with mod assist using bed features PT Short Term Goal 2 (Week 1): Pt will perform sit<>stand transfers using LRAD with max assist of 1 PT Short Term Goal 3 (Week 1): Pt will perform bed<>chair transfers using LRAD with mod assist without lift equipment PT Short Term Goal 4 (Week 1): Pt will ambulate at least 76f using LRAD with +2 mod assist   Skilled Therapeutic Interventions/Progress Updates:    Session 1  Pt received supine in bed with NT reporting that they had just transferred pt to bed. Daughter reports that pt has been in chair since early in AM and needs to rest due to severe lethargy. Left in bed with all needs in reach.   Session 2.  Pt received supine in bed. Attempted to have pt roll to sit on EOB, but pt actively resisted assist from PT saying "NO". And repeating "we shouldn't" several time. Daughter requesting that pt be allowed to rest for remainder of the day. Left in supine with call bell in reach.    Therapy Documentation Precautions:  Precautions Precautions: Fall, Other (comment) Precaution Comments: L inattention, hx of Alzheimer's Restrictions Weight Bearing Restrictions: No General:  Missed time: 1232m.   Vital Signs: Therapy Vitals Temp: 98.3 F (36.8 C) Temp Source: Oral Pulse Rate: 65 Resp: 18 BP: (!) 129/59 Patient Position (if appropriate): Lying Oxygen Therapy SpO2: 95 % O2 Device: Room Air Pain: Pain Assessment Pain Scale: 0-10 Pain Score: 0-No pain    Therapy/Group: Individual Therapy  AuLorie Phenix/06/2022, 7:57 AM

## 2022-06-05 NOTE — Progress Notes (Signed)
Family is concerned about patient. Family concerned about pt right lean and difficulty standing. No acute changes noted at this time. Katharine Look PA notified.   06/05/22 1208  Vitals  Temp 98.6 F (37 C)  Temp Source Oral  BP 113/63  MAP (mmHg) 80  BP Location Left Arm  BP Method Automatic  Patient Position (if appropriate) Sitting  Pulse Rate 72  Resp 18  MEWS COLOR  MEWS Score Color Green  Oxygen Therapy  SpO2 100 %  O2 Device Room Air  MEWS Score  MEWS Temp 0  MEWS Systolic 0  MEWS Pulse 0  MEWS RR 0  MEWS LOC 0  MEWS Score 0

## 2022-06-05 NOTE — Progress Notes (Signed)
Occupational Therapy Session Note  Patient Details  Name: Katelyn Powers MRN: [id number] Date of Birth: 01/19/1932  Today's Date: 06/05/2022 OT Individual Time: 6384-5364 OT Individual Time Calculation (min): 62 min    Short Term Goals: Week 1:  OT Short Term Goal 1 (Week 1): Pt will trasnfer to toilet with MOD A and LRAD OT Short Term Goal 2 (Week 1): Pt will locate 3/5 neded ADL items on L of center to demo improved L attention OT Short Term Goal 3 (Week 1): Pt will initate 1/4 steps of UB dressing OT Short Term Goal 4 (Week 1): Pt will complete oral care with MOD cuing for sequencing  Skilled Therapeutic Interventions/Progress Updates:  Pt awake in bed upon OT arrival to the room. Pt reports, "I need to go to the bathroom." Pt in agreement for OT session.  Therapy Documentation Precautions:  Precautions Precautions: Fall, Other (comment) Precaution Comments: L inattention, hx of Alzheimer's Restrictions Weight Bearing Restrictions: No Vital Signs: Please see "Flowsheet" for most recent vitals charted by nursing staff.  Pain: Pain Assessment Pain Scale: 0-10 Pain Score: 0-No pain  ADL: Grooming: Moderate assistance (Pt requires moderate - maximal assist for thoroughness with facial hygiene and to apply makeup while seated in w/c at the sink. However, pt able to complete oral care with minimal assistance and moderate verbal/tactile prompts to initiate and sequence.) Where Assessed-Grooming: Wheelchair, Sitting at sink Upper Body Bathing: Minimal assistance, Maximal cueing (Pt able to bathe all UB parts with minimal assistance for thoroughness and maximal verbal and tactile prompts for initiation and sequencing due to decreased alertness during a sponge bath seated in the w/c at the sink.) Where Assessed-Upper Body Bathing: Standing at sink, Wheelchair Lower Body Bathing: Maximal assistance (Pt requires maximal assistance to bathe all LB parts while seated on the BSC prior to  donning clothing.) Where Assessed-Lower Body Bathing:  (BSC) Upper Body Dressing: Maximal cueing, Maximal assistance (Pt requires maximal assistance to don a bra and pull-over style shirt while seated in the w/c with maximal verbal and tactile prompts for initiation and sequencing. Pt able to pull shirt over head and participate in pulling clothing down with VCs.) Where Assessed-Upper Body Dressing: Wheelchair Lower Body Dressing: Dependent (Pt requires maximal assistance x 2 to complete LB dressing on BSC. Pt requires maximal assistance x 1 for clothing management and maximal assistance x 1 to ensure standing balance while assist is provided to pull clothing up.) Where Assessed-Lower Body Dressing:  (BSC) Toileting: Dependent (Pt reported need to void. Pt voided bladder in brief prior to transfer (pt unaware) and requires 2-person assistance to perform toileting hygiene and clothing mangement. Pt requires maximal assistance x 1 for clothing & maximal assist x 1 for standing.) Where Assessed-Toileting: Bedside Commode Toilet Transfer: Maximal assistance (Pt requires maximal assistance x 1 to complete a SPT to the R from EOB > BSC > w/c with blocking of LLE.) Toilet Transfer Method: Stand pivot Toilet Transfer Equipment: Extra wide drop arm bedside commode ADL Comments: Pt continues to requires increased time for ADLs due to decreased alertness levels. Pt requires maximal verbal and tactile prompts for initiation, sequencing, and to improve alertness throughout session.  Alertness & Command Following: Pt initially demo's improved alertness at beginning of session during bed mobility and transfer to [facility]. However, then pt continued to demo pdecreased arousal and alertness. Pt then requires maximal verbal and tactile prompts to keep eyes open and improve participation in OT task. Pt has been seen by this  therapist for sessions in the AM & PM with pt demo'ing significantly increased alertness later in the  day. OT will plan to talk to scheduling department to potentially move pt to a later schedule in order to improve participation in ADL/OT sessions. On the previous day, pt's daughter reports that PTA pt would sleep until 08:00 - 09:00 or later in the day.   Pt requested to stay in the w/c at end of session. Pt left sitting comfortably in the w/c with personal belongings and call light within reach, belt alarm placed and activated, and comfort needs attended to.    Therapy/Group: Individual Therapy  Barbee Shropshire 06/05/2022, 10:17 AM

## 2022-06-05 NOTE — Progress Notes (Signed)
   06/05/22 0900 06/05/22 1245 06/05/22 1500  Unmeasured Output  Urine Occurrence '1 1 1  '$ Urine Characteristics  Urinary Incontinence Yes Yes Yes  Urine Color Yellow/straw Yellow/straw Yellow/straw  Urine Appearance  --   --   --   Bladder Scan Volume (mL)  --   --  20 mL  Hygiene Bath Peri care Peri care    06/05/22 1747  Unmeasured Output  Urine Occurrence 1  Urine Characteristics  Urinary Incontinence No  Urine Color Yellow/straw  Urine Appearance Clear  Bladder Scan Volume (mL)  --   Hygiene Peri care

## 2022-06-05 NOTE — Progress Notes (Signed)
Patient ID: Katelyn Powers, female   DOB: 02/03/1932, 89 y.o.   MRN: [id number]  Team Conference Report to Patient/Family  Team Conference discussion was reviewed with the patient and caregiver, including goals, any changes in plan of care and target discharge date.  Patient and caregiver express understanding and are in agreement.  The patient has a target discharge date of  (Evals pending).  Sw met with patient and daughter, Katelyn Powers and provided team conference updates. Daughter concerned about patient being lethargic and leaning. Daughter shares she has requested for patient to have Melatonin at night before 7 PM. Sw will provide daughter with HC resources. No additional questions or concerns, sw will update daughter of recommendations closer to d/c.    J  06/05/2022, 1:55 PM  

## 2022-06-05 NOTE — Patient Care Conference (Signed)
Inpatient RehabilitationTeam Conference and Plan of Care Update Date: 06/05/2022   Time: 3:28 PM    Patient Name: Katelyn Powers      Medical Record Number: [id number]  Date of Birth: 08-Jun-1932 Sex: Female         Room/Bed: 4W10C/4W10C-01 Payor Info: Payor: [employer] MEDICARE / Plan: [employer] MEDICARE / Product Type: *No Product type* /    Admit Date/Time:  05/28/2022  2:51 PM  Primary Diagnosis:  Cerebrovascular accident (CVA) of right thalamus [facility])  Hospital Problems: Principal Problem:   Cerebrovascular accident (CVA) of right thalamus [facility])    Expected Discharge Date: Expected Discharge Date: 06/19/22  Team Members Present: Physician leading conference: Dr. Jennye Boroughs Social Worker Present: Erlene Quan, BSW Nurse Present: Other (comment) Benjie Karvonen, RN) PT Present: Barrie Folk, PT OT Present: Willeen Cass, OT SLP Present: Sherren Kerns, SLP PPS Coordinator present : Gunnar Fusi, SLP     Current Status/Progress Goal Weekly Team Focus  Bowel/Bladder   incontinent of bladder and bowel.  regain continence.  continue bladder program.   Swallow/Nutrition/ Hydration             ADL's   due to lethargy & inconsistent performance, max A overall with self-care, toileting total assistance, transfers can fluctuate from min to total assist  min UB dressing and toilet transfers; mod LB dressing, bathing, toileting  ADL training, functional mobility, visual scanning, cognition, pt/family education   Mobility   fluctuating functional mobility levels due to fatigue and impaired cognition with impaired initiaiton and awareness - max assist supine<>sit, mod/max assist sit<>stand and stand pivot transfers using RW vs B HHA, mod/max assist gait up to 23f using RW with +2 w/c follow (requires manual facilitation to weight shift to initiate taking a step)  min assist overall at household ambulatory level  activity tolerance, bed mobility training, pt/family education,  standing tolerance and balance, functional transfer training, DME training, gait training, motor planning and sequencing   Communication             Safety/Cognition/ Behavioral Observations            Pain   denies pain.  remains pain free.  assess qshift and prn   Skin   redness to groin and abdominal folds  prevent further deteroration  assess q shift and prn     Discharge Planning:  Discharging home with daughter (Jones Broom both daughters plan to rotate supervision.   Team Discussion: Patient admitted with CVA. Incontinent x 2, redness noted to groin/buttocks. Denies pain. Patient experiencing sleepiness interrupting therapy sessions. Ability to participate varies. Voids when placed on a toilet so candidate for timed toileting but can be unable to manage transfers during times of fatigue. Will move in with daughter.  Patient on target to meet rehab goals: no, will downgrade some goals  *See Care Plan and progress notes for long and short-term goals.   Revisions to Treatment Plan:  May need to downgrade PT/OT goals  Teaching Needs: Family education, bladder/bowel management  Current Barriers to Discharge: Incontinence, behaviors  Possible Resolutions to Barriers: Reduce therapy to 15/7 to improve participation, family education, timed toileting protocol     Medical Summary Current Status: right thalamic infarct PCA distribution,Alzheimner's dementia, bladder incontinence, HTN, CHF  Barriers to Discharge: Incontinence;Medical stability;Home enviroment access/layout  Barriers to Discharge Comments: right thalamic infarct PCA distribution,Alzheimner's dementia, bladder incontinence, HTN, CHF Possible Resolutions to [employer]: monitor bladder function, monitor WBC, therapy 15/7, monitor fluid status   Continued  Need for Acute Rehabilitation Level of Care: The patient requires daily medical management by a physician with specialized training in physical medicine and  rehabilitation for the following reasons: Direction of a multidisciplinary physical rehabilitation program to maximize functional independence : Yes Medical management of patient stability for increased activity during participation in an intensive rehabilitation regime.: Yes Analysis of laboratory values and/or radiology reports with any subsequent need for medication adjustment and/or medical intervention. : Yes   I attest that I was present, lead the team conference, and concur with the assessment and plan of the team.   Barrett Shell 06/05/2022, 3:28 PM

## 2022-06-05 NOTE — Progress Notes (Signed)
PROGRESS NOTE   Subjective/Complaints:  In WC this AM working with therapy. She is alert but sleepy this AM.  Reports she slept well. Therapy reports she voids when she sits on toilet.    ROS- limited due to cognition/sedation Objective:   No results found. Recent Labs    06/03/22 0721  WBC 7.9  HGB 13.9  HCT 41.6  PLT 211    Recent Labs    06/04/22 0635  NA 139  K 4.3  CL 104  CO2 25  GLUCOSE 124*  BUN 27*  CREATININE 1.13*  CALCIUM 9.9     Intake/Output Summary (Last 24 hours) at 06/05/2022 0953 Last data filed at 06/04/2022 1839 Gross per 24 hour  Intake 354 ml  Output --  Net 354 ml         Physical Exam: Vital Signs Blood pressure (!) 129/59, pulse 65, temperature 98.3 F (36.8 C), temperature source Oral, resp. rate 18, height '5\' 2"'$  (1.575 m), weight 68.3 kg, SpO2 95 %.     General: Alert,  NAD HENT: conjugate gaze; oropharynx moist CV: regular rate; no JVD Pulmonary: CTA B/L; no W/R/R- good air movement GI: soft, NT, ND, (+)BS Psychiatric: Normal affect, pleasant Skin: No evidence of breakdown, no evidence of rash Neurologic:  alert but sleepy,eyes closed but will open spontaneously, nodding to questions, motor strength is 5/5 in bilateral deltoid, bicep, tricep, grip, hip flexor, knee extensors, ankle dorsiflexor and plantar flexor Left neglect evident.  Cannot assess sensation due to cog def Musculoskeletal: Full range of motion in all 4 extremities. No joint swelling   Assessment/Plan: 1. Functional deficits which require 3+ hours per day of interdisciplinary therapy in a comprehensive inpatient rehab setting. Physiatrist is providing close team supervision and 24 hour management of active medical problems listed below. Physiatrist and rehab team continue to assess barriers to discharge/monitor patient progress toward functional and medical goals  Care Tool:  Bathing    Body parts  bathed by patient: Right arm, Left arm, Abdomen, Chest, Right upper leg, Left upper leg, Right lower leg, Left lower leg   Body parts bathed by helper: Front perineal area     Bathing assist Assist Level: Maximal Assistance - Patient 24 - 49%     Upper Body Dressing/Undressing Upper body dressing   What is the patient wearing?: Pull over shirt    Upper body assist Assist Level: Minimal Assistance - Patient > 75%    Lower Body Dressing/Undressing Lower body dressing      What is the patient wearing?: Pants, Incontinence brief     Lower body assist Assist for lower body dressing: Dependent - Patient 0%     Toileting Toileting    Toileting assist Assist for toileting: 2 Helpers     Transfers Chair/bed transfer  Transfers assist  Chair/bed transfer activity did not occur: Safety/medical concerns  Chair/bed transfer assist level: Maximal Assistance - Patient 25 - 49% (squat pivot to R and L side)     Locomotion Ambulation   Ambulation assist      Assist level: 2 helpers Assistive device: Other (comment) (3 Musketeer) Max distance: 36f   Walk 10 feet activity  Assist     Assist level: 2 helpers Assistive device: Other (comment) (3 Musketeer)   Walk 50 feet activity   Assist Walk 50 feet with 2 turns activity did not occur: Safety/medical concerns         Walk 150 feet activity   Assist Walk 150 feet activity did not occur: Safety/medical concerns         Walk 10 feet on uneven surface  activity   Assist Walk 10 feet on uneven surfaces activity did not occur: Safety/medical concerns         Wheelchair     Assist Is the patient using a wheelchair?: Yes Type of Wheelchair: Manual    Wheelchair assist level: Dependent - Patient 0%      Wheelchair 50 feet with 2 turns activity    Assist        Assist Level: Dependent - Patient 0%   Wheelchair 150 feet activity     Assist      Assist Level: Dependent -  Patient 0%   Blood pressure (!) 129/59, pulse 65, temperature 98.3 F (36.8 C), temperature source Oral, resp. rate 18, height '5\' 2"'$  (1.575 m), weight 68.3 kg, SpO2 95 %. Medical Problem List and Plan: 1. Functional deficits secondary to right thalamic infarct PCA distribution ; Alzheimner's dementia and prior stroke/TIA with left arm weakness which has improved  and left hemianopsia             -patient may shower             -ELOS/Goals: 14-21 days, min assist goals,   Continue CIR- PT, OT and SLP  -Team conference today  -15/7 2.  Antithrombotics: -DVT/anticoagulation:  Pharmaceutical: Lovenox             -antiplatelet therapy: Plavix and aspirin 81 mg (family declines Brilinta) 3. Pain Management: Tylenol as needed 4. Mood/Behavior/Sleep: LCSW to evaluate and provide emotional support             -antipsychotic agents: n/a 5. Neuropsych/cognition: This patient is not capable of making decisions on her own behalf. 6. Skin/Wound Care: Routine skin care checks 7. Fluids/Electrolytes/Nutrition: Routine I's and O's and follow-up chemistries             -heart healthy diet/thin liq; crush meds in pureed Poor fluid intake will recheck BMET in am   8/5- Last Cr 8/4 was 1.21- stable- BUN slightly more elevated at 29- will recheck Monday and if getting worse, might need IVFs  8/7 Scr down to 1.13, BUN 27, continue to encourage oral fluids 8: Hypertension: continue amlodipine 5 mg daily             -Cozaar 50 mg daily is being held  8/9 well controlled, continue amlodipine Vitals:   06/04/22 1926 06/05/22 0445  BP: 114/78 (!) 129/59  Pulse: 74 65  Resp: 18 18  Temp: 98.5 F (36.9 C) 98.3 F (36.8 C)  SpO2: 97% 95%   9: Hyperlipidemia: continue Zetia, Pravachol, heart healthy diet 10: CAD: stable, no CP 11: Alzheimer's dementia; follows with Dr. Felecia Shelling. Does not tolerate donepezil 12: Diastolic CHF: stable, no SOB or edema, no signs of overload 13: Insomnia: continue melatonin>>need  to give by 7-7:30 pm nightly- one daughter requests to not awaken pt for turning at night , the other daughter is ok with skin care protocol 14: Left nipple skin change: no sign of infection; follow-up as outpatient 15: Urinary incontinence: possibly chronic component due to dementia             -  check PVR's  -Therapy reports she voids when sits on toilet, will retry timed voids 16.  Mild leukocytosis, persistent AMS since hospitalization check UA C and S cath spec since pt is incont   8/5- U/A shows moderate leuks/few bacteria/WBCs 21-50- will await Urine Cx- so far 40K e coli- so not at a point likely needs to be treated unless more symptomatic.   8/6- E Coli 40k- will not treat for now- is afebrile and per nursing at baseline  8/7 CBC 7.9 today, stable   LOS: 8 days A FACE TO FACE EVALUATION WAS PERFORMED  Jennye Boroughs 06/05/2022, 9:53 AM

## 2022-06-05 NOTE — Progress Notes (Addendum)
Contacted by RN this morning that patient's daughter reported right-sided lean and irritability on her arrival this morning around 9am. Morning OT note reviewed>>no new concerns. Evaluated patient sitting at bedside in [facility]. Alert and following commands. Neuro status at baseline per my exam on 8/1. Face symmetric and speech clear. Upper and lower extremity motor at baseline. Able to name daughter and granddaughter in room. UA and culture ordered. Discussed with Dr. Curlene Dolphin.  Returned to check in on patient at 1530. She was too fatigued today to participate with family; however, she ate all of her sandwich at lunch without assistance and daughter assisted her with her soup which she consumed.  No evidence of acute neuro change. Continue to monitor. Discussed with RN. Follow-up UA.

## 2022-06-06 NOTE — Progress Notes (Signed)
Physical Therapy Weekly Progress Note  Patient Details  Name: Katelyn Powers MRN: [id number] Date of Birth: 05/09/1932  Beginning of progress report period: May 29, 2022 End of progress report period: June 06, 2022  Today's Date: 06/06/2022 PT Individual Time: 1405-1500     Patient has met 4 of 4 short term goals.  Pt is making slow and inconsistent progress towards LTG of min assist overall. Limited by apraxia and bouts of lethargy as well as mild midline disorientation. Pt has improved to be able to perform sit<>stand transfers with as little of min assist, but max assist at times for reasons listed above as well as mod assist for ambulation up to 115 ft or max assist with +2 WC follow to initiate 2 steps with RW.   Patient continues to demonstrate the following deficits muscle weakness and muscle joint tightness, decreased cardiorespiratoy endurance, impaired timing and sequencing, motor apraxia, and decreased coordination, decreased midline orientation and decreased attention to left, decreased initiation, decreased attention, decreased awareness, decreased problem solving, decreased safety awareness, decreased memory, and delayed processing, and decreased sitting balance, decreased standing balance, decreased postural control, hemiplegia, and decreased balance strategies and therefore will continue to benefit from skilled PT intervention to increase functional independence with mobility.  Patient progressing toward long term goals..  Continue plan of care.  PT Short Term Goals Week 1:  PT Short Term Goal 1 (Week 1): Pt will perform supine<>sit with mod assist using bed features PT Short Term Goal 1 - Progress (Week 1): Met PT Short Term Goal 2 (Week 1): Pt will perform sit<>stand transfers using LRAD with max assist of 1 PT Short Term Goal 2 - Progress (Week 1): Met PT Short Term Goal 3 (Week 1): Pt will perform bed<>chair transfers using LRAD with mod assist without lift equipment PT  Short Term Goal 3 - Progress (Week 1): Met PT Short Term Goal 4 (Week 1): Pt will ambulate at least 82f using LRAD with +2 mod assist PT Short Term Goal 4 - Progress (Week 1): Met Week 2:  PT Short Term Goal 1 (Week 2): Pt will consistently perform sit<>stand with mod assist PT Short Term Goal 2 (Week 2): Pt will ambulate 569fwith mod assist PT Short Term Goal 3 (Week 2): Pt will perform bed mobility with mod assist consistently PT Short Term Goal 4 (Week 2): pt will trasnfer to and from bed with Mod assist and no +2 with RW  Skilled Therapeutic Interventions/Progress Updates:   Pt received sitting in WC and agreeable to PT  Sit<>stand with min-mod assist with RW throughout session requiring increased assist with transfers at end of session due to fatigue. .   Gait training x 11546fith RW, +2 for safety, and min assist from PT to facilate forward progress and management of RW only in turns. Pt noted to have improved midline orientation on this day with improved advancement of the LLE.     Standing balance to perform lateral reach to obtain horseshoe and toss to target performed 2 x 6 with min assist from PT to maintain balance with significant improvement in attention to and initiation of task throughout session.   Nustep reciprocal movement and endurance training x 5 minutes with 1 therapeutic rest break at 3 minutes. .   Pt returned to room and performed ambulatory transfer to bed with RW and min-mod assist due to fatigue and poor AD management. Sit>supine completed with mod assist at BLE only, and left supine in  bed with call bell in reach and all needs met.       Therapy Documentation Precautions:  Precautions Precautions: Fall, Other (comment) Precaution Comments: L inattention, hx of Alzheimer's Restrictions Weight Bearing Restrictions: No   Pain:  denies   Therapy/Group: Individual Therapy  Lorie Phenix 06/06/2022, 3:28 PM

## 2022-06-06 NOTE — Progress Notes (Addendum)
PROGRESS NOTE   Subjective/Complaints:   Increased Leaning noted per daughter yesterday , reviewed therapy notes , pt reportedly more alert in pm , UA neg    ROS- limited due to cognition/sedation Objective:   No results found. No results for input(s): "WBC", "HGB", "HCT", "PLT" in the last 72 hours.  Recent Labs    06/04/22 0635  NA 139  K 4.3  CL 104  CO2 25  GLUCOSE 124*  BUN 27*  CREATININE 1.13*  CALCIUM 9.9     Intake/Output Summary (Last 24 hours) at 06/06/2022 0804 Last data filed at 06/05/2022 1800 Gross per 24 hour  Intake 240 ml  Output --  Net 240 ml         Physical Exam: Vital Signs Blood pressure (!) 144/68, pulse 65, temperature 98 F (36.7 C), resp. rate 17, height '5\' 2"'$  (1.575 m), weight 68.3 kg, SpO2 100 %.   General: No acute distress Mood and affect are appropriate Heart: Regular rate and rhythm no rubs murmurs or extra sounds Lungs: Clear to auscultation, breathing unlabored, no rales or wheezes Abdomen: Positive bowel sounds, soft nontender to palpation, nondistended Extremities: No clubbing, cyanosis, or edema Skin: No evidence of breakdown, no evidence of rash   Neurologic:  alert eating breakfast , oriented to person , not place or time (unchanged) motor strength is 4/5 in bilateral deltoid, bicep, tricep, grip, hip flexor, knee extensors, ankle dorsiflexor and plantar flexor Left neglect evident.  Cannot assess sensation due to cog def Musculoskeletal: Full range of motion in all 4 extremities. No joint swelling   Assessment/Plan: 1. Functional deficits which require 3+ hours per day of interdisciplinary therapy in a comprehensive inpatient rehab setting. Physiatrist is providing close team supervision and 24 hour management of active medical problems listed below. Physiatrist and rehab team continue to assess barriers to discharge/monitor patient progress toward functional  and medical goals  Care Tool:  Bathing    Body parts bathed by patient: Right arm, Left arm, Abdomen, Chest, Right upper leg, Left upper leg, Right lower leg, Left lower leg   Body parts bathed by helper: Front perineal area     Bathing assist Assist Level: Maximal Assistance - Patient 24 - 49%     Upper Body Dressing/Undressing Upper body dressing   What is the patient wearing?: Pull over shirt    Upper body assist Assist Level: Minimal Assistance - Patient > 75%    Lower Body Dressing/Undressing Lower body dressing      What is the patient wearing?: Pants, Incontinence brief     Lower body assist Assist for lower body dressing: Dependent - Patient 0%     Toileting Toileting    Toileting assist Assist for toileting: 2 Helpers     Transfers Chair/bed transfer  Transfers assist  Chair/bed transfer activity did not occur: Safety/medical concerns  Chair/bed transfer assist level: Maximal Assistance - Patient 25 - 49% (squat pivot to R and L side)     Locomotion Ambulation   Ambulation assist      Assist level: 2 helpers Assistive device: Other (comment) (3 Musketeer) Max distance: 70f   Walk 10 feet activity   Assist  Assist level: 2 helpers Assistive device: Other (comment) (3 Musketeer)   Walk 50 feet activity   Assist Walk 50 feet with 2 turns activity did not occur: Safety/medical concerns         Walk 150 feet activity   Assist Walk 150 feet activity did not occur: Safety/medical concerns         Walk 10 feet on uneven surface  activity   Assist Walk 10 feet on uneven surfaces activity did not occur: Safety/medical concerns         Wheelchair     Assist Is the patient using a wheelchair?: Yes Type of Wheelchair: Manual    Wheelchair assist level: Dependent - Patient 0%      Wheelchair 50 feet with 2 turns activity    Assist        Assist Level: Dependent - Patient 0%   Wheelchair 150 feet  activity     Assist      Assist Level: Dependent - Patient 0%   Blood pressure (!) 144/68, pulse 65, temperature 98 F (36.7 C), resp. rate 17, height '5\' 2"'$  (1.575 m), weight 68.3 kg, SpO2 100 %. Medical Problem List and Plan: 1. Functional deficits secondary to right thalamic infarct PCA distribution ; Alzheimner's dementia and prior stroke/TIA with left arm weakness which has improved  and left hemianopsia             -patient may shower             -ELOS/Goals: 8/23 min assist goals,   Continue CIR- PT, OT and SLP    -15/7- due to easy fatiguability, would rec concentrating OT, PT in afternoon, leaning consistent with stroke location with Left neglect  2.  Antithrombotics: -DVT/anticoagulation:  Pharmaceutical: Lovenox             -antiplatelet therapy: Plavix and aspirin 81 mg (family declines Brilinta) 3. Pain Management: Tylenol as needed 4. Mood/Behavior/Sleep: LCSW to evaluate and provide emotional support             -antipsychotic agents: n/a 5. Neuropsych/cognition: This patient is not capable of making decisions on her own behalf. 6. Skin/Wound Care: Routine skin care checks 7. Fluids/Electrolytes/Nutrition: Routine I's and O's and follow-up chemistries             -heart healthy diet/thin liq; crush meds in pureed Poor fluid intake will recheck BMET in am   8/5- Last Cr 8/4 was 1.21- stable- BUN slightly more elevated at 29- will recheck Monday and if getting worse, might need IVFs  8/7 Scr down to 1.13, BUN 27, continue to encourage oral fluids 8: Hypertension: continue amlodipine 5 mg daily             -Cozaar 50 mg daily is being held  8/10 controlled  Vitals:   06/05/22 1919 06/06/22 0449  BP: (!) 132/57 (!) 144/68  Pulse: 75 65  Resp: 15 17  Temp: 97.9 F (36.6 C) 98 F (36.7 C)  SpO2: 97% 100%   9: Hyperlipidemia: continue Zetia, Pravachol, heart healthy diet 10: CAD: stable, no CP 11: Alzheimer's dementia; follows with Dr. Felecia Shelling. Does not tolerate  donepezil 12: Diastolic CHF: stable, no SOB or edema, no signs of overload 13: Insomnia: continue melatonin>>need to give by 7-7:30 pm nightly- one daughter requests to not awaken pt for turning at night , the other daughter is ok with skin care protocol 14: Left nipple skin change: no sign of infection; follow-up as outpatient 15: Urinary  incontinence: possibly chronic component due to dementia             -check PVR's  -Therapy reports she voids when sits on toilet, will retry timed voids 16.  Mild leukocytosis, resolved UA neg , afeb   LOS: 9 days A FACE TO FACE EVALUATION WAS PERFORMED  Charlett Blake 06/06/2022, 8:04 AM

## 2022-06-06 NOTE — Progress Notes (Signed)
Occupational Therapy Weekly Progress Note  Patient Details  Name: Katelyn Powers MRN: [id number] Date of Birth: 12-05-1931  Beginning of progress report period: May 29, 2022 End of progress report period: June 06, 2022  Today's Date: 06/06/2022 OT Individual Time: 2836-6294 OT Individual Time Calculation (min): 60 min  and Today's Date: 06/06/2022 OT Missed Time: 15 Minutes Missed Time Reason: Patient fatigue   Patient has met 1  of 4 short term goals.  Pt has made limited progress this week as she has not been able to be consistent with her function.  At times, she can pull herself up to stand using a grab bar or the stedy with only min A. Most of the time she has been requiring significant A as she is unable to understand the goal or purpose of the task. Pt is frequently disoriented.    Patient continues to demonstrate the following deficits: muscle weakness, decreased coordination and decreased motor planning, decreased visual perceptual skills, decreased visual motor skills, and hemianopsia, decreased attention to left, decreased initiation, decreased attention, decreased awareness, decreased problem solving, decreased safety awareness, decreased memory, and delayed processing, and decreased sitting balance, decreased standing balance, decreased postural control, hemiplegia, and decreased balance strategies and therefore will continue to benefit from skilled OT intervention to enhance overall performance with BADL.  Patient not progressing toward long term goals.  See goal revision..  Continue plan of care.  LTGs will not be downgraded yet as pt has struggled with lethargy.  Goals will be reevaluated next week.    OT Short Term Goals Week 1:  OT Short Term Goal 1 (Week 1): Pt will trasnfer to toilet with MOD A and LRAD OT Short Term Goal 1 - Progress (Week 1): Not progressing (pt is very inconsistent. Some days she can do a max of 1 other days she is total A with stedy.) OT Short Term  Goal 2 (Week 1): Pt will locate 3/5 neded ADL items on L of center to demo improved L attention OT Short Term Goal 2 - Progress (Week 1): Progressing toward goal OT Short Term Goal 3 (Week 1): Pt will initate 1/4 steps of UB dressing OT Short Term Goal 3 - Progress (Week 1): Met OT Short Term Goal 4 (Week 1): Pt will complete oral care with MOD cuing for sequencing OT Short Term Goal 4 - Progress (Week 1): Not progressing (continues to need hand over hand A) Week 2:  OT Short Term Goal 1 (Week 2): Pt will consistently rise to stand to prep for toileting with mod A of 1. OT Short Term Goal 2 (Week 2): Pt will consistently transfer to toilet with mod A of 1 or less. OT Short Term Goal 3 (Week 2): Pt will don overhead shirt with mod A. OT Short Term Goal 4 (Week 2): Pt will demonstrate improved attention to brush her teeth with mod A or less.  Skilled Therapeutic Interventions/Progress Updates:    Pt received in bed. No family present at the time.  Encouraged pt to get up to get to the toilet.  Pt with very limited attention as she kept closing her eyes.  Max A to total A to sit up to EOB.  Pt kept saying, "My husband is over there. Let's see what he thinks".  Tried to reorient pt and pt would smile in response.  Had to elevate the bed extremely high to enable pt to rise to stand in stedy as +2 A not available. Pt will  very limited attention and initiation.  Pt required heavy max to rise to stand in stedy.   Pt taken to sink to brush teeth while perched in stedy.  Handed her brush with toothpaste on it but she shook paste off so reapplied.  Pt brought brush to mouth but then put it down.  Total A with thorough oral care.   +2 A from tech arrived. Pt taken to toilet.  Total A of 2 to rise to stand as pt kept saying "no no no".  Upon rising, her brief was soaked with urine.  Removed brief and pt sat for awhile on toilet with eyes closed. No further void.  Donned new pants over feet and then pt stood in  stedy with heavy max to rise to stand.  Total A with LB cleansing and pulling up pants.   Pt taken to sink again. She tolerated sitting in perched position in stedy for close to 20+ minutes to engage in UB self care.  Patient DID participate more at this time.  Washed UB with MOD verbal cues, applied deoderant.  Max with donning button up shirt and sweater. Pt did apply lipstick, Total A with hair.  Pt kept closing her eyes intermittently during this process.    Sat pt in wc and she kept eyes closed. Due to limited energy and participation, stopped session after 1 hour.  Pt in wc with belt alarm on, tele sitter on. Soft call bell on lap.    Therapy Documentation Precautions:  Precautions Precautions: Fall, Other (comment) Precaution Comments: L inattention, hx of Alzheimer's Restrictions Weight Bearing Restrictions: No  Pain: Pain Assessment Pain Scale: 0-10 Pain Score: 0-No pain      Therapy/Group: Individual Therapy  [place] 06/06/2022, 11:49 AM

## 2022-06-07 LAB — URINE CULTURE: Culture: 20000 — AB

## 2022-06-07 MED ORDER — MELATONIN 5 MG PO TABS
10.0000 mg | ORAL_TABLET | Freq: Every day | ORAL | Status: DC
  Administered 2022-06-07 – 2022-06-10 (×4): 10 mg via ORAL
  Filled 2022-06-07 (×4): qty 2

## 2022-06-07 NOTE — Progress Notes (Signed)
Physical Therapy Session Note  Patient Details  Name: Katelyn Powers MRN: [id number] Date of Birth: 1932/01/07  Today's Date: 06/07/2022 PT Individual Time: 1345-1445 PT Individual Time Calculation (min): 60 min   Short Term Goals: Week 1:  PT Short Term Goal 1 (Week 1): Pt will perform supine<>sit with mod assist using bed features PT Short Term Goal 1 - Progress (Week 1): Met PT Short Term Goal 2 (Week 1): Pt will perform sit<>stand transfers using LRAD with max assist of 1 PT Short Term Goal 2 - Progress (Week 1): Met PT Short Term Goal 3 (Week 1): Pt will perform bed<>chair transfers using LRAD with mod assist without lift equipment PT Short Term Goal 3 - Progress (Week 1): Met PT Short Term Goal 4 (Week 1): Pt will ambulate at least 37f using LRAD with +2 mod assist PT Short Term Goal 4 - Progress (Week 1): Met Week 2:  PT Short Term Goal 1 (Week 2): Pt will consistently perform sit<>stand with mod assist PT Short Term Goal 2 (Week 2): Pt will ambulate 511fwith mod assist PT Short Term Goal 3 (Week 2): Pt will perform bed mobility with mod assist consistently PT Short Term Goal 4 (Week 2): pt will trasnfer to and from bed with Mod assist and no +2 with RW Week 3:    Week 4:     Skilled Therapeutic Interventions/Progress Updates:   Pt received sitting in WC and agreeable to PT.  [facility] initially progressing to mod assist at end of session due to fatigue.    Gait training.   Stair maArchivist  Toilet transfer to Attempt urination or BM.   Pt returned to room and performed ** transfer to bed with **. Sit>supine completed with ** and left supine in bed with call bell in reach and all needs met.       Therapy Documentation Precautions:  Precautions Precautions: Fall, Other (comment) Precaution Comments: L inattention, hx of Alzheimer's Restrictions Weight Bearing Restrictions: No General:   Vital  Signs: Therapy Vitals Temp: 97.6 F (36.4 C) Temp Source: Oral Pulse Rate: 79 Resp: 16 BP: 100/62 Patient Position (if appropriate): Sitting Oxygen Therapy SpO2: 96 % O2 Device: Room Air Pain:   Mobility:   Locomotion :    Trunk/Postural Assessment :    Balance:   Exercises:   Other Treatments:      Therapy/Group: Individual Therapy  AuLorie Phenix/08/2022, 3:05 PM

## 2022-06-07 NOTE — Progress Notes (Signed)
Occupational Therapy Session Note  Patient Details  Name: Katelyn Powers MRN: [id number] Date of Birth: 11/14/1931  Today's Date: 06/07/2022 OT Individual Time: 1025-8527 OT Individual Time Calculation (min): 60 min    Short Term Goals: Week 2:  OT Short Term Goal 1 (Week 2): Pt will consistently rise to stand to prep for toileting with mod A of 1. OT Short Term Goal 2 (Week 2): Pt will consistently transfer to toilet with mod A of 1 or less. OT Short Term Goal 3 (Week 2): Pt will don overhead shirt with mod A. OT Short Term Goal 4 (Week 2): Pt will demonstrate improved attention to brush her teeth with mod A or less.  Skilled Therapeutic Interventions/Progress Updates:    Pt received in bed smiling and alert. Her daughter and granddtr present. Pt needed mod cues and mod A to start getting out of bed. Once her legs were moved towards EOB, pt then initiated and helped bring herself to sit.  Pt worked on [employer] self care at [employer] with much improved initiation and attention to task.   Placed RW in front of pt and cued her to stand up to walk to toilet. Pt stood with mod A and then ambulated toward bathroom, needing mod A physical support and max cues to keep advancing her feet. Pt tries to push the RW very far in front and even when you stop the walker from moving pt will keep trying to push it forward before stepping her feet forward.  She tried to stop halfway to bathroom but with MAX encouragement she continued the walk.  Her brief was wet but once on toilet, she urinated more and had a small BM (documented in flow sheet).   Total A with pericare and clothing. Pt ambulated a few feet back to wc with max cues and mod A. Sat in wc to brush teeth with max cues for initiation and demonstration and then she followed through and completed task.  Pt remained at sink so her daughter could work on her hair.   Discussed with daughter about finding a support group for caregivers of those with dementia, discussed  the challenges of caregiving and provided her with a resource to watch videos to learn more about dementia and how to help a loved one with it.   Therapy Documentation Precautions:  Precautions Precautions: Fall, Other (comment) Precaution Comments: L inattention, hx of Alzheimer's Restrictions Weight Bearing Restrictions: No  Pain: Pain Assessment Pain Scale: 0-10 Pain Score: 0-No pain PAINAD (Pain Assessment in Advanced Dementia) Breathing: normal Negative Vocalization: none Facial Expression: smiling or inexpressive Body Language: relaxed Consolability: no need to console PAINAD Score: 0 ADL: ADL Grooming: Minimal assistance Where Assessed-Grooming: Wheelchair, Sitting at sink Upper Body Bathing: Supervision/safety, Moderate cueing Where Assessed-Upper Body Bathing: Edge of bed Lower Body Bathing: Moderate cueing, Moderate assistance Where Assessed-Lower Body Bathing: Edge of bed Upper Body Dressing: Moderate cueing, Moderate assistance Where Assessed-Upper Body Dressing: Edge of bed Lower Body Dressing: Moderate cueing, Moderate assistance Where Assessed-Lower Body Dressing: Edge of bed Toileting: Dependent Where Assessed-Toileting: Glass blower/designer: Moderate assistance Toilet Transfer Method: Counselling psychologist: Extra wide drop arm bedside commode  Therapy/Group: Individual Therapy  [place] 06/07/2022, 12:19 PM

## 2022-06-07 NOTE — Progress Notes (Signed)
PROGRESS NOTE   Subjective/Complaints:  Remains disoriented,  Increased Leaning noted per daughter yesterday , reviewed therapy notes , pt reportedly more alert in pm , UA neg    ROS- limited due to cognition Objective:   No results found. No results for input(s): "WBC", "HGB", "HCT", "PLT" in the last 72 hours.  No results for input(s): "NA", "K", "CL", "CO2", "GLUCOSE", "BUN", "CREATININE", "CALCIUM" in the last 72 hours.   Intake/Output Summary (Last 24 hours) at 06/07/2022 0743 Last data filed at 06/06/2022 1750 Gross per 24 hour  Intake 360 ml  Output --  Net 360 ml         Physical Exam: Vital Signs Blood pressure (!) 130/58, pulse 69, temperature 98.1 F (36.7 C), resp. rate 16, height '5\' 2"'$  (1.575 m), weight 68.3 kg, SpO2 96 %.   General: No acute distress Mood and affect are appropriate Heart: Regular rate and rhythm no rubs murmurs or extra sounds Lungs: Clear to auscultation, breathing unlabored, no rales or wheezes Abdomen: Positive bowel sounds, soft nontender to palpation, nondistended Extremities: No clubbing, cyanosis, or edema Skin: No evidence of breakdown, no evidence of rash   Neurologic:  alert eating breakfast , oriented to person , not place or time (unchanged) motor strength is 4/5 in bilateral deltoid, bicep, tricep, grip, hip flexor, knee extensors, ankle dorsiflexor and plantar flexor Left neglect evident.  Cannot assess sensation due to cog def Musculoskeletal: Full range of motion in all 4 extremities. No joint swelling   Assessment/Plan: 1. Functional deficits which require 3+ hours per day of interdisciplinary therapy in a comprehensive inpatient rehab setting. Physiatrist is providing close team supervision and 24 hour management of active medical problems listed below. Physiatrist and rehab team continue to assess barriers to discharge/monitor patient progress toward functional  and medical goals  Care Tool:  Bathing    Body parts bathed by patient: Right arm, Left arm, Abdomen, Chest, Right upper leg, Left upper leg, Right lower leg, Left lower leg   Body parts bathed by helper: Front perineal area     Bathing assist Assist Level: Maximal Assistance - Patient 24 - 49%     Upper Body Dressing/Undressing Upper body dressing   What is the patient wearing?: Pull over shirt    Upper body assist Assist Level: Minimal Assistance - Patient > 75%    Lower Body Dressing/Undressing Lower body dressing      What is the patient wearing?: Pants, Incontinence brief     Lower body assist Assist for lower body dressing: Dependent - Patient 0%     Toileting Toileting    Toileting assist Assist for toileting: 2 Helpers     Transfers Chair/bed transfer  Transfers assist  Chair/bed transfer activity did not occur: Safety/medical concerns  Chair/bed transfer assist level: Maximal Assistance - Patient 25 - 49% (squat pivot to R and L side)     Locomotion Ambulation   Ambulation assist      Assist level: 2 helpers Assistive device: Other (comment) (3 Musketeer) Max distance: 70f   Walk 10 feet activity   Assist     Assist level: 2 helpers Assistive device: Other (comment) (3 Musketeer)  Walk 50 feet activity   Assist Walk 50 feet with 2 turns activity did not occur: Safety/medical concerns         Walk 150 feet activity   Assist Walk 150 feet activity did not occur: Safety/medical concerns         Walk 10 feet on uneven surface  activity   Assist Walk 10 feet on uneven surfaces activity did not occur: Safety/medical concerns         Wheelchair     Assist Is the patient using a wheelchair?: Yes Type of Wheelchair: Manual    Wheelchair assist level: Dependent - Patient 0%      Wheelchair 50 feet with 2 turns activity    Assist        Assist Level: Dependent - Patient 0%   Wheelchair 150 feet  activity     Assist      Assist Level: Dependent - Patient 0%   Blood pressure (!) 130/58, pulse 69, temperature 98.1 F (36.7 C), resp. rate 16, height '5\' 2"'$  (1.575 m), weight 68.3 kg, SpO2 96 %. Medical Problem List and Plan: 1. Functional deficits secondary to right thalamic infarct PCA distribution ; probable Alzheimer's plus vascular dementia and prior stroke/TIA with left arm weakness which has improved  and left hemianopsia             -patient may shower             -ELOS/Goals: 8/23 min assist goals,   Continue CIR- PT, OT and SLP    -15/7- due to easy fatiguability, would rec concentrating OT, PT in afternoon, leaning consistent with stroke location with Left neglect  2.  Antithrombotics: -DVT/anticoagulation:  Pharmaceutical: Lovenox             -antiplatelet therapy: Plavix and aspirin 81 mg (family declines Brilinta) 3. Pain Management: Tylenol as needed 4. Mood/Behavior/Sleep: LCSW to evaluate and provide emotional support             -antipsychotic agents: n/a 5. Neuropsych/cognition: This patient is not capable of making decisions on her own behalf. 6. Skin/Wound Care: Routine skin care checks 7. Fluids/Electrolytes/Nutrition: Routine I's and O's and follow-up chemistries             -heart healthy diet/thin liq; crush meds in pureed Poor fluid intake will recheck BMET in am   8/5- Last Cr 8/4 was 1.21- stable- BUN slightly more elevated at 29- will recheck Monday and if getting worse, might need IVFs  8/7 Scr down to 1.13, BUN 27, continue to encourage oral fluids 8: Hypertension: continue amlodipine 5 mg daily             -Cozaar 50 mg daily is being held  8/10 controlled  Vitals:   06/06/22 2037 06/07/22 0339  BP: (!) 145/66 (!) 130/58  Pulse: 73 69  Resp: 18 16  Temp: 98 F (36.7 C) 98.1 F (36.7 C)  SpO2: 94% 96%   9: Hyperlipidemia: continue Zetia, Pravachol, heart healthy diet 10: CAD: stable, no CP 11: Alzheimer's dementia; follows with Dr.  Felecia Shelling. Does not tolerate donepezil 12: Diastolic CHF: stable, no SOB or edema, no signs of overload 13: Insomnia: continue melatonin>>need to give by 7-7:30 pm nightly- one daughter requests to not awaken pt for turning at night , the other daughter is ok with skin care protocol 14: Left nipple skin change: no sign of infection; follow-up as outpatient 15: Urinary incontinence: possibly chronic component due to dementia             -  check PVR's  -Therapy reports she voids when sits on toilet, will retry timed voids    LOS: 10 days A FACE TO [place] E Maria Coin 06/07/2022, 7:43 AM

## 2022-06-08 NOTE — Progress Notes (Signed)
PROGRESS NOTE   Subjective/Complaints:  Sleeping awakens to physical stim and follows commands    ROS- limited due to cognition Objective:   No results found. No results for input(s): "WBC", "HGB", "HCT", "PLT" in the last 72 hours.  No results for input(s): "NA", "K", "CL", "CO2", "GLUCOSE", "BUN", "CREATININE", "CALCIUM" in the last 72 hours.   Intake/Output Summary (Last 24 hours) at 06/08/2022 1053 Last data filed at 06/07/2022 1814 Gross per 24 hour  Intake 480 ml  Output --  Net 480 ml         Physical Exam: Vital Signs Blood pressure 118/67, pulse 72, temperature 97.7 F (36.5 C), temperature source Oral, resp. rate 18, height '5\' 2"'$  (1.575 m), weight 68.3 kg, SpO2 96 %.   General: No acute distress Mood and affect are appropriate Heart: Regular rate and rhythm no rubs murmurs or extra sounds Lungs: Clear to auscultation, breathing unlabored, no rales or wheezes Abdomen: Positive bowel sounds, soft nontender to palpation, nondistended Extremities: No clubbing, cyanosis, or edema Skin: No evidence of breakdown, no evidence of rash   Neurologic:  somnolent but awakens during  exam  motor strength is 4/5 in bilateral deltoid, bicep, tricep, grip, hip flexor, knee extensors, ankle dorsiflexor and plantar flexor Left neglect evident.  Cannot assess sensation due to cog def Musculoskeletal: no pain with range of motion in Upper extremities. No joint swelling   Assessment/Plan: 1. Functional deficits which require 3+ hours per day of interdisciplinary therapy in a comprehensive inpatient rehab setting. Physiatrist is providing close team supervision and 24 hour management of active medical problems listed below. Physiatrist and rehab team continue to assess barriers to discharge/monitor patient progress toward functional and medical goals  Care Tool:  Bathing    Body parts bathed by patient: Right arm, Left  arm, Abdomen, Chest, Right upper leg, Left upper leg, Right lower leg, Left lower leg   Body parts bathed by helper: Front perineal area     Bathing assist Assist Level: Maximal Assistance - Patient 24 - 49%     Upper Body Dressing/Undressing Upper body dressing   What is the patient wearing?: Pull over shirt    Upper body assist Assist Level: Minimal Assistance - Patient > 75%    Lower Body Dressing/Undressing Lower body dressing      What is the patient wearing?: Pants, Incontinence brief     Lower body assist Assist for lower body dressing: Dependent - Patient 0%     Toileting Toileting    Toileting assist Assist for toileting: 2 Helpers     Transfers Chair/bed transfer  Transfers assist  Chair/bed transfer activity did not occur: Safety/medical concerns  Chair/bed transfer assist level: Maximal Assistance - Patient 25 - 49% (squat pivot to R and L side)     Locomotion Ambulation   Ambulation assist      Assist level: 2 helpers Assistive device: Other (comment) (3 Musketeer) Max distance: 25f   Walk 10 feet activity   Assist     Assist level: 2 helpers Assistive device: Other (comment) (3 Musketeer)   Walk 50 feet activity   Assist Walk 50 feet with 2 turns activity did not  occur: Safety/medical concerns         Walk 150 feet activity   Assist Walk 150 feet activity did not occur: Safety/medical concerns         Walk 10 feet on uneven surface  activity   Assist Walk 10 feet on uneven surfaces activity did not occur: Safety/medical concerns         Wheelchair     Assist Is the patient using a wheelchair?: Yes Type of Wheelchair: Manual    Wheelchair assist level: Dependent - Patient 0%      Wheelchair 50 feet with 2 turns activity    Assist        Assist Level: Dependent - Patient 0%   Wheelchair 150 feet activity     Assist      Assist Level: Dependent - Patient 0%   Blood pressure 118/67,  pulse 72, temperature 97.7 F (36.5 C), temperature source Oral, resp. rate 18, height '5\' 2"'$  (1.575 m), weight 68.3 kg, SpO2 96 %. Medical Problem List and Plan: 1. Functional deficits secondary to right thalamic infarct PCA distribution ; probable Alzheimer's plus vascular dementia and prior stroke/TIA with left arm weakness which has improved  and left hemianopsia             -patient may shower             -ELOS/Goals: 8/23 min assist goals,   Continue CIR- PT, OT and SLP    -15/7- due to easy fatiguability, would rec concentrating OT, PT in afternoon, leaning consistent with stroke location with Left neglect  2.  Antithrombotics: -DVT/anticoagulation:  Pharmaceutical: Lovenox             -antiplatelet therapy: Plavix and aspirin 81 mg (family declines Brilinta) 3. Pain Management: Tylenol as needed 4. Mood/Behavior/Sleep: LCSW to evaluate and provide emotional support             -antipsychotic agents: n/a 5. Neuropsych/cognition: This patient is not capable of making decisions on her own behalf. 6. Skin/Wound Care: Routine skin care checks 7. Fluids/Electrolytes/Nutrition: Routine I's and O's and follow-up chemistries             -heart healthy diet/thin liq; crush meds in pureed Poor fluid intake will recheck BMET in am   8/5- Last Cr 8/4 was 1.21- stable- BUN slightly more elevated at 29- will recheck Monday and if getting worse, might need IVFs  8/7 Scr down to 1.13, BUN 27, continue to encourage oral fluids 8: Hypertension: continue amlodipine 5 mg daily             -Cozaar 50 mg daily is being held  8/12 controlled  Vitals:   06/07/22 1931 06/08/22 0326  BP: 101/74 118/67  Pulse: 77 72  Resp: 18 18  Temp: 98.2 F (36.8 C) 97.7 F (36.5 C)  SpO2: 93% 96%   9: Hyperlipidemia: continue Zetia, Pravachol, heart healthy diet 10: CAD: stable, no CP 11: Alzheimer's dementia; follows with Dr. Felecia Shelling. Does not tolerate donepezil 12: Diastolic CHF: stable, no SOB or edema, no  signs of overload 13: Insomnia: continue melatonin>>need to give by 7-7:30 pm nightly- one daughter requests to not awaken pt for turning at night , the other daughter is ok with skin care protocol 14: Left nipple skin change: no sign of infection; follow-up as outpatient 15: Urinary incontinence: possibly chronic component due to dementia             -check PVR's  -Therapy reports she  voids when sits on toilet, will retry timed voids    LOS: 11 days A FACE TO [place] E Tahisha Hakim 06/08/2022, 10:53 AM

## 2022-06-08 NOTE — Progress Notes (Signed)
Occupational Therapy Session Note  Patient Details  Name: Katelyn Powers MRN: [id number] Date of Birth: [birth year]  Today's Date: 06/08/2022 OT Individual Time: 1126-1220 OT Individual Time Calculation (min): 54 min    Short Term Goals: Week 2:  OT Short Term Goal 1 (Week 2): Pt will consistently rise to stand to prep for toileting with mod A of 1. OT Short Term Goal 2 (Week 2): Pt will consistently transfer to toilet with mod A of 1 or less. OT Short Term Goal 3 (Week 2): Pt will don overhead shirt with mod A. OT Short Term Goal 4 (Week 2): Pt will demonstrate improved attention to brush her teeth with mod A or less.  Skilled Therapeutic Interventions/Progress Updates:  Pt asleep in w/c upon OT arrival to the room. Pt reports, "I'm singing. Christmas music." Pt in agreement for OT session.  Therapy Documentation Precautions:  Precautions Precautions: Fall, Other (comment) Precaution Comments: L inattention, hx of Alzheimer's Restrictions Weight Bearing Restrictions: No Vital Signs: Please see "Flowsheet" for most recent vitals charted by nursing staff.  Pain: Pain Assessment Pain Scale: 0-10 Pain Score: 0-No pain  ADL: Eating: OT provides set-up assistance to open containers and set-up tray upon arrival back to room at end of session. Pt requires moderate VC's to initiate eating. However, pt continuing to eat at end of OT session when OT exits room. Consulted with NT to inform that pt was eating and to check on pt during lunch to ensure proper nutritional intake. NT verbalizes understanding. Grooming: Moderate assistance (Pt requires moderate assistance for thoroughness to complete hand hygiene while sitting in the w/c at the sink.) Where Assessed-Grooming: Wheelchair, Sitting at sink Toileting: Dependent (Pt unable to void at this time. However, OT provided assistance for pt to sit on the toilet for timed toileting. Pt requires total assistance x 2 for clothing management. Brief  was check and remained dry.) Where Assessed-Toileting: Bedside Commode (placed over toilet) Toilet Transfer: Moderate assistance (Pt able to complete SPT from w/c <> wide BSC placed over the toilet with moderate assistance and use of grab bars for safety.) Toilet Transfer Method: Stand pivot Toilet Transfer Equipment: Grab bars, Extra wide drop arm bedside commode (BSC placed over toilet) ADL Comments: Pt initially more awake at beginning of session. However, pt closed eyes and requires maximal assistance to keep eyes open after completing toileting tasks. Pt demo's improved standing balance with w/c > BSC (placed over toilet) transfer as pt able to complete with moderate assistance x 1 with use of grab bars and pt initiating steps for pivot. However, pt demo's decreased command following and attempts to sit prematurely with BSC > w/c transfer requiring moderate assistance x 2 to transfer back to w/c.  Environmental Awareness & Improving Alertness: Pt participates in therapeutic activity in order to improve overall alertness and environmental alertness by transitioning to outdoors. Pt tolerates transition from inside to outside appropriately, however, pt noted to have eyes closed for majority of time being transported with total assistance from therapist via w/c. Once outside, OT encouraged pt to open eyes to visually scan the environment for improved alertness, geographical awareness for the season/weather outside, and participation in social conversation. Pt noted to open eyes at random times and demo's increased difficulty with opening eyes on command to visually scan the environment. Pt encouraged to name colors of flowers within the garden with pt able to correctly name 0/3 colors of flowers. OT facilitates pt to improve geographical awareness with pt able to  report "summer" when given options of the seasons. However, pt reports that the temperature feels "cold". Pt able to correctly report that the  weather is "sunny". Pt participates in this awareness activity with intermittent opening of eyes, however, eyes were closed for majority of time outdoors. Pt transported back to room with total assistance in w/c.   Pt requested to stay in the w/c at end of session. Pt left sitting comfortably in the w/c with personal belongings and call light within reach, belt alarm placed and activated, and comfort needs attended to.   Therapy/Group: Individual Therapy  Barbee Shropshire 06/08/2022, 12:32 PM

## 2022-06-08 NOTE — Progress Notes (Signed)
Physical Therapy Session Note  Patient Details  Name: Katelyn Powers MRN: [id number] Date of Birth: 08-17-1932  Today's Date: 06/08/2022 PT Individual Time: 0804-0900 PT Individual Time Calculation (min): 56 min   Short Term Goals: Week 1:  PT Short Term Goal 1 (Week 1): Pt will perform supine<>sit with mod assist using bed features PT Short Term Goal 1 - Progress (Week 1): Met PT Short Term Goal 2 (Week 1): Pt will perform sit<>stand transfers using LRAD with max assist of 1 PT Short Term Goal 2 - Progress (Week 1): Met PT Short Term Goal 3 (Week 1): Pt will perform bed<>chair transfers using LRAD with mod assist without lift equipment PT Short Term Goal 3 - Progress (Week 1): Met PT Short Term Goal 4 (Week 1): Pt will ambulate at least 29f using LRAD with +2 mod assist PT Short Term Goal 4 - Progress (Week 1): Met Week 2:  PT Short Term Goal 1 (Week 2): Pt will consistently perform sit<>stand with mod assist PT Short Term Goal 2 (Week 2): Pt will ambulate 573fwith mod assist PT Short Term Goal 3 (Week 2): Pt will perform bed mobility with mod assist consistently PT Short Term Goal 4 (Week 2): pt will trasnfer to and from bed with Mod assist and no +2 with RW   Skilled Therapeutic Interventions/Progress Updates:  Patient supine in bed asleep on entrance to room. Patient easily roused. Then alert and agreeable to PT session.   Patient with no pain complaint throughout session.  Therapeutic Activity: Bed Mobility: Patient performed supine <> sit with max encouragement and heavy vc/ tc for bringing BLE off EOB. Mod A to push up from bed surface on R side. Pt leans back on request to scoot forward. Asked pt to place L hand on [name] R shoulder in order to encourage forward lean for improve forward scoot performance, Requires ModA to locate L hand and initiate movement to therapist's shoulder.  Improved lean with reach and requires Min/ Mod A for forward scoot to place Bil feet on floor.    Transfers: STEDY used for transfer to toilet from seated position on EOB.  Dressing performed on toilet with pt requiring MinA for UB threading of arms into sleeves. Total A for buttons. MaxA for LB dressing and brief change. Max A for pericare. Patient performed sit<>stand into STEDY with Max A for power up and MinA/ CGA to complete. [facility] and significant facilitation for forward lean. Ambulatory transfer to w/c using RW and requires ModA to manage walker, maintain close proximity to walker and for step sequence in pivot stepping.   Gait Training:  Patient ambulated 10 ft using RW with Min/ ModA. Demonstrated decreased step height/ length on LLE with fatigue and d/t requiring awareness of LLE. Provided vc/ tc for LLE.  Neuromuscular Re-ed: NMR facilitated during session with focus on sitting and standing balance. Pt guided in reaching outside of BOS while seated on EOB to complete dressing tasks. Attempt for increased BLE muscle activation with elevating EOB to 45 deg angle of knees/ hips. Rise to stand and lower to sit with elevated surface and CGA. Pt questions, "What next?" Further cued just to stand up and sit down 3 more times. NMR performed for improvements in motor control and coordination, balance, sequencing, judgement, and self confidence/ efficacy in performing all aspects of mobility at highest level of independence.   Patient seated upright  in w/c at end of session with brakes  locked, belt alarm set, and all needs within reach.   Therapy Documentation Precautions:  Precautions Precautions: Fall, Other (comment) Precaution Comments: L inattention, hx of Alzheimer's Restrictions Weight Bearing Restrictions: No General:   Vital Signs:   Pain: Pain Assessment Pain Scale: 0-10 Pain Score: 0-No pain  Therapy/Group: Individual Therapy  Alger Simons PT, DPT, CSRS 06/08/2022, 12:01 PM

## 2022-06-09 NOTE — Progress Notes (Signed)
Occupational Therapy Session Note  Patient Details  Name: Katelyn Powers MRN: [id number] Date of Birth: 02/08/1932  Today's Date: 06/09/2022 OT Individual Time: 0906-1001 OT Individual Time Calculation (min): 55 min    Short Term Goals: Week 1:  OT Short Term Goal 1 (Week 1): Pt will trasnfer to toilet with MOD A and LRAD OT Short Term Goal 1 - Progress (Week 1): Not progressing (pt is very inconsistent. Some days she can do a max of 1 other days she is total A with stedy.) OT Short Term Goal 2 (Week 1): Pt will locate 3/5 neded ADL items on L of center to demo improved L attention OT Short Term Goal 2 - Progress (Week 1): Progressing toward goal OT Short Term Goal 3 (Week 1): Pt will initate 1/4 steps of UB dressing OT Short Term Goal 3 - Progress (Week 1): Met OT Short Term Goal 4 (Week 1): Pt will complete oral care with MOD cuing for sequencing OT Short Term Goal 4 - Progress (Week 1): Not progressing (continues to need hand over hand A)  Skilled Therapeutic Interventions/Progress Updates:  Pt greeted supine in bed, pt pleasantly confused but agreeable to OT intervention. Session focus on BADL reeducation, functional mobility, increasing level of arousal, attention, following commands and decreasing overall caregiver burden.              Pt completed supine>sit with MOD A needing use of bed pad to scoot hips fully to EOB, pt was able to maneuver BLES to EOB.utilized stedy for OOB transfer as pt noted to be saturated.  Pt able to stand to stedy with MINA, and able to maintain static standing balance with BUE support with CGA while OTA and RT provided total A for pericare, pt was able to wipe anterior periarea with CGA in standing but needing assist for cleanliness. Total A transport to w/c in stedy. Pt completed bathing tasks from w/c at sink with overall MOD A for UB bathing. Pt needing MAX multimodal cues to wash LUE/arm pit but able to complete task with + time and effort. Pt continues to  present with severe L inattention even with OTA standing on her L side handing her ADL items from L side. Pt completed oral care with MINA needing step by step cues and MAX A to terminate task as pt fixated on brushing teeth. OTA rolled pts hair to work on OOB tolerance attention to task and facilitate improved ability to direct level of care,however once OTA started brushing her hair pt began to shut her eyes needed MAX cues to stay awake.  Total A transport out of room to day room in w/c to work on attention and awareness where pt instructed to match colored coins to colored dots on the table, pt able to accurately match 3/4 colors with MAX cues. Pt transported back to room in w/c with total A where pt left up in w/c with alarm belt activated and all needs within reach, RN aware that pt up in w/c, asked RN to check in on pt in 1 hour.                  Therapy Documentation Precautions:  Precautions Precautions: Fall, Other (comment) Precaution Comments: L inattention, hx of Alzheimer's Restrictions Weight Bearing Restrictions: No  Pain: no pain reported during session     Therapy/Group: Individual Therapy   Kate Kanoy 06/09/2022, 12:01 PM 

## 2022-06-09 NOTE — Progress Notes (Signed)
Physical Therapy Session Note  Patient Details  Name: Katelyn Powers MRN: [id number] Date of Birth: [birth year]  Today's Date: 06/09/2022 PT Individual Time: 1300-1345 and 1450-1535 PT Individual Time Calculation (min): 45 min and 45 min  Short Term Goals: Week 2:  PT Short Term Goal 1 (Week 2): Pt will consistently perform sit<>stand with mod assist PT Short Term Goal 2 (Week 2): Pt will ambulate 29f with mod assist PT Short Term Goal 3 (Week 2): Pt will perform bed mobility with mod assist consistently PT Short Term Goal 4 (Week 2): pt will trasnfer to and from bed with Mod assist and no +2 with RW  Skilled Therapeutic Interventions/Progress Updates:     Session 1: Patient in w/c with family in the room upon PT arrival. Patient alert, agreeable to and participated throughout PT session. Patient denied pain and did not display outward signs of pain during session.  Patient with language of confusion, significantly decreased visual attention to the L, poor initiation, and required increased time for initiation, cuing, rest breaks, and for completion of tasks throughout session. Utilized therapeutic use of self throughout to promote efficiency.   Therapeutic Activity: Transfers: Patient performed sit to/from stand x3 with mod A +2 on first trial and mod A of 1 on subsequent trials using RW. Provided multimodal cues for hand placement, scooting forward, forward weight shift, initiation with 1-2-3 count, and completion of turns and controlled descent while sitting.  Patient performed stand/squat pivot bed>w/c using the arm rest only with min A of 1 person. Provided cues for initiation on use of arm rest with improved motor planning with more familiar AD.   Gait Training:  Patient ambulated 35 feet, 78 feet, and 73 feet using RW with mod A and +2 w/c follow for safety. Utilized visual and verbal stimulation from +2 in front of patient on first trial to improve participation and attention. Able to  initiate and attend without frontal stimulation on subsequent trials.  Ambulated with decreased gait speed, decreased step length and height, shuffling gait, narrow BOS, significant forward trunk lean, and downward head gaze. Provided multimodal cues for management of AD, safe proximity to RW, attention and visual scanning ahead and to the L, and promoted forward momentum with manual facilitation of forward propulsion of the RW.  Neuromuscular Re-ed: Patient performed the following L attention and visual scanning activities >10 min seated in the w/c: -identifying items and colors at midline, tracking to the L, and progressed to locating items on the L with reaching and handing objects using her R hand for cross body motion; patient required max cues for L attention and mod cues for naming items and colors  Patient in w/c in the room at end of session with breaks locked, seat belt alarm set, Telesitter in place, and all needs within reach. Identified patient preferred music, classical music, via trial and error at end of session.  Session 2: Patient in w/c in the room upon PT arrival.  Patient alert, agreeable to and participated throughout PT session. Patient denied pain and did not display outward signs of pain during session.   Focused session on L attention, visual scanning, and standing balance with functional tasks in the ADL kitchen.  Therapeutic Activity: Transfers: Patient performed sit to/from stand x1 with min A with patient holding the kitchen sink. Provided verbal cues for initiation and safety when sitting due to poor L attention and R lean. Patient performed stand pivot w/c>BSC over toilet with mod A using  grab bar with increased time for initiation and motor planning with a novel task. Patient was incontinent of bladder prior to toileting, performed peri-care and lower body clothing management with total A.  Seating in the w/c, patient identified 4/6 fruits and 3 types of silverware  and sorted the silverware from a pile on the L to matching groupings of 3 on the R with ~66% accuracy and max cues for visual scanning to the L  Neuromuscular Re-ed: Patient performed the following standing balance activities: -standing at the kitchen sink >8 min, locating 9 items of silverware on the L and rinsing them in the water then handing the to therapist to dry them with CGA-min A for balance and intermittent upper extremity use and hips against the counter for steadying support; required mod-max cues to attend to task and visual scan L, reduced cues with repetition of task; PT provided soap during the washing with total A  Patient in w/c in the room at end of session with breaks locked, seat belt alarm set, Telesitter in place, and all needs within reach.   Therapy Documentation Precautions:  Precautions Precautions: Fall, Other (comment) Precaution Comments: L inattention, hx of Alzheimer's Restrictions Weight Bearing Restrictions: No    Therapy/Group: Individual Therapy  Katelyn Powers PT, DPT, NCS, CBIS  06/09/2022, 3:47 PM

## 2022-06-10 DIAGNOSIS — F02B2 Dementia in other diseases classified elsewhere, moderate, with psychotic disturbance: Secondary | ICD-10-CM

## 2022-06-10 LAB — BASIC METABOLIC PANEL
Anion gap: 9 (ref 5–15)
BUN: 29 mg/dL — ABNORMAL HIGH (ref 8–23)
CO2: 23 mmol/L (ref 22–32)
Calcium: 9.8 mg/dL (ref 8.9–10.3)
Chloride: 105 mmol/L (ref 98–111)
Creatinine, Ser: 1.03 mg/dL — ABNORMAL HIGH (ref 0.44–1.00)
GFR, Estimated: 53 mL/min — ABNORMAL LOW (ref >60)
Glucose, Bld: 126 mg/dL — ABNORMAL HIGH (ref 70–99)
Potassium: 4 mmol/L (ref 3.5–5.1)
Sodium: 137 mmol/L (ref 135–145)

## 2022-06-10 LAB — CBC
HCT: 38.3 % (ref 36.0–46.0)
Hemoglobin: 12.8 g/dL (ref 12.0–15.0)
MCH: 29.6 pg (ref 26.0–34.0)
MCHC: 33.4 g/dL (ref 30.0–36.0)
MCV: 88.5 fL (ref 80.0–100.0)
Platelets: 196 10*3/uL (ref 150–400)
RBC: 4.33 MIL/uL (ref 3.87–5.11)
RDW: 13.5 % (ref 11.5–15.5)
WBC: 8.3 10*3/uL (ref 4.0–10.5)
nRBC: 0 % (ref 0.0–0.2)

## 2022-06-10 NOTE — Progress Notes (Signed)
Occupational Therapy Session Note  Patient Details  Name: Katelyn Powers MRN: [id number] Date of Birth: 30-Jul-1932  Today's Date: 06/10/2022 OT Co-Treatment Time: 1300-1330 OT Co-Treatment Time Calculation (min): 30 min   Short Term Goals: Week 2:  OT Short Term Goal 1 (Week 2): Pt will consistently rise to stand to prep for toileting with mod A of 1. OT Short Term Goal 2 (Week 2): Pt will consistently transfer to toilet with mod A of 1 or less. OT Short Term Goal 3 (Week 2): Pt will don overhead shirt with mod A. OT Short Term Goal 4 (Week 2): Pt will demonstrate improved attention to brush her teeth with mod A or less.  Skilled Therapeutic Interventions/Progress Updates:    Cotreatment with PT. Focus on sit<>stand, standing balance, following one step commands, attention to task, attending to Lt, and activity tolerance. Sit<>stand with mod A and standing balance at table with min A. Pt required max multimodal cues for sequencing with sit<>stand. Table tasks included color recognition and following one step commands with various objects. Pt required max verbal cues to attend to task and for redirection during tasks. Pt frequently would stop action and stare out the window. Easily redirected but frequentyly required [facility] assistance for redirection. Pt returned to room and remained in w/c with daughter present. Belt alarm activated and all needs within reach.   Therapy Documentation Precautions:  Precautions Precautions: Fall, Other (comment) Precaution Comments: L inattention, hx of Alzheimer's Restrictions Weight Bearing Restrictions: No  Pain:  No s/s of pain and pt denied pain this afternoon  Therapy/Group: Individual Therapy  Leroy Libman 06/10/2022, 1:59 PM

## 2022-06-10 NOTE — Progress Notes (Signed)
Physical Therapy Session Note  Patient Details  Name: Katelyn Powers MRN: [id number] Date of Birth: January 23, 1932  Today's Date: 06/10/2022 PT Individual Time: 5465-0354 PT Individual Time Calculation (min): 54 min   Short Term Goals: Week 2:  PT Short Term Goal 1 (Week 2): Pt will consistently perform sit<>stand with mod assist PT Short Term Goal 2 (Week 2): Pt will ambulate 49f with mod assist PT Short Term Goal 3 (Week 2): Pt will perform bed mobility with mod assist consistently PT Short Term Goal 4 (Week 2): pt will trasnfer to and from bed with Mod assist and no +2 with RW  Skilled Therapeutic Interventions/Progress Updates:     Pt received seated in recliner and agrees to therapy. Reports no pain. Pt in process of eating crockers and peanut butter because she had apparently not had assistance for breakfast or lunch. PT supervises eating crackers and cues pt to attend to L side while feeding. WC transport to gym for time management. Pt stands with modA +1 and cues for hand placement and body mechanics. Pt ambulates x75' with WC follow and modA, with PT heavily facilitating RW management as pt attention is very poor and appears to frequently forgot task at hand. Following seated rest break, pt ambulates x80' with similar assistance, without requiring WC follow. Pt takes extended seated rest break prior to ambulating final bout of 80'. [place] frequent manual facilitation of increasing proximity to RW due to pt pushing farther and farther out ahead of body. WC transport back to room. Left seated with alarm intact and all needs within reach.  Therapy Documentation Precautions:  Precautions Precautions: Fall, Other (comment) Precaution Comments: L inattention, hx of Alzheimer's Restrictions Weight Bearing Restrictions: No    Therapy/Group: Individual Therapy  WBreck Coons PT, DPT 06/10/2022, 5:42 PM

## 2022-06-10 NOTE — Progress Notes (Signed)
PROGRESS NOTE   Subjective/Complaints:  Pt awake in bed. Ate breakfast. Appears comfortable  ROS: limited by cognitive/linguistic  Objective:   No results found. Recent Labs    06/10/22 0536  WBC 8.3  HGB 12.8  HCT 38.3  PLT 196   Recent Labs    06/10/22 0536  NA 137  K 4.0  CL 105  CO2 23  GLUCOSE 126*  BUN 29*  CREATININE 1.03*  CALCIUM 9.8    Intake/Output Summary (Last 24 hours) at 06/10/2022 1033 Last data filed at 06/10/2022 0809 Gross per 24 hour  Intake 670 ml  Output --  Net 670 ml        Physical Exam: Vital Signs Blood pressure (!) 144/62, pulse 66, temperature 97.6 F (36.4 C), resp. rate 17, height '5\' 2"'$  (1.575 m), weight 68.3 kg, SpO2 97 %.  Constitutional: No distress . Vital signs reviewed. HEENT: NCAT, EOMI, oral membranes moist Neck: supple Cardiovascular: RRR without murmur. No JVD    Respiratory/Chest: CTA Bilaterally without wheezes or rales. Normal effort    GI/Abdomen: BS +, non-tender, non-distended Ext: no clubbing, cyanosis, or edema Psych: pleasant and cooperative  Skin: intact Neurologic:  awake, alert. motor strength is 4/5 in bilateral deltoid, bicep, tricep, grip, hip flexor, knee extensors, ankle dorsiflexor and plantar flexor Left neglect evident.  Cannot assess sensation due to cog def Musculoskeletal: no pain with range of motion in Upper extremities. No joint swelling   Assessment/Plan: 1. Functional deficits which require 3+ hours per day of interdisciplinary therapy in a comprehensive inpatient rehab setting. Physiatrist is providing close team supervision and 24 hour management of active medical problems listed below. Physiatrist and rehab team continue to assess barriers to discharge/monitor patient progress toward functional and medical goals  Care Tool:  Bathing    Body parts bathed by patient: Right arm, Left arm, Chest, Abdomen, Face   Body parts  bathed by helper: Front perineal area, Buttocks     Bathing assist Assist Level: Moderate Assistance - Patient 50 - 74%     Upper Body Dressing/Undressing Upper body dressing   What is the patient wearing?: Button up shirt    Upper body assist Assist Level: Moderate Assistance - Patient 50 - 74%    Lower Body Dressing/Undressing Lower body dressing      What is the patient wearing?: Pants, Incontinence brief     Lower body assist Assist for lower body dressing: Total Assistance - Patient < 25%     Toileting Toileting    Toileting assist Assist for toileting: 2 Helpers     Transfers Chair/bed transfer  Transfers assist  Chair/bed transfer activity did not occur: Safety/medical concerns  Chair/bed transfer assist level: Dependent - mechanical lift (stedy)     Locomotion Ambulation   Ambulation assist      Assist level: 2 helpers Assistive device: Other (comment) (3 Musketeer) Max distance: 20f   Walk 10 feet activity   Assist     Assist level: 2 helpers Assistive device: Other (comment) (3 Musketeer)   Walk 50 feet activity   Assist Walk 50 feet with 2 turns activity did not occur: Safety/medical concerns  Walk 150 feet activity   Assist Walk 150 feet activity did not occur: Safety/medical concerns         Walk 10 feet on uneven surface  activity   Assist Walk 10 feet on uneven surfaces activity did not occur: Safety/medical concerns         Wheelchair     Assist Is the patient using a wheelchair?: Yes Type of Wheelchair: Manual    Wheelchair assist level: Dependent - Patient 0%      Wheelchair 50 feet with 2 turns activity    Assist        Assist Level: Dependent - Patient 0%   Wheelchair 150 feet activity     Assist      Assist Level: Dependent - Patient 0%   Blood pressure (!) 144/62, pulse 66, temperature 97.6 F (36.4 C), resp. rate 17, height '5\' 2"'$  (1.575 m), weight 68.3 kg, SpO2 97  %. Medical Problem List and Plan: 1. Functional deficits secondary to right thalamic infarct PCA distribution ; probable Alzheimer's plus vascular dementia and prior stroke/TIA with left arm weakness which has improved  and left hemianopsia             -patient may shower             -ELOS/Goals: 8/23 min assist goals,   Continue CIR- PT, OT and SLP    -have reduced to 15/7- due to easy fatiguability, would rec concentrating OT, PT in afternoon, leaning consistent with stroke location with Left neglect  2.  Antithrombotics: -DVT/anticoagulation:  Pharmaceutical: Lovenox  -keep an eye on platelets, trending down slightly             -antiplatelet therapy: Plavix and aspirin 81 mg (family declines Brilinta) 3. Pain Management: Tylenol as needed 4. Mood/Behavior/Sleep: LCSW to evaluate and provide emotional support             -antipsychotic agents: n/a 5. Neuropsych/cognition: This patient is not capable of making decisions on her own behalf. 6. Skin/Wound Care: Routine skin care checks 7. Fluids/Electrolytes/Nutrition: Routine I's and O's and follow-up chemistries             -heart healthy diet/thin liq; crush meds in pureed Poor fluid intake will recheck BMET in am   8/5- Last Cr 8/4 was 1.21- stable- BUN slightly more elevated at 29- will recheck Monday and if getting worse, might need IVFs  8/14 BUN still elevated at 29. Push fluids  -no IVF at this point 8: Hypertension: continue amlodipine 5 mg daily             -Cozaar 50 mg daily is being held  8/14 controlled  Vitals:   06/09/22 1929 06/10/22 0326  BP: (!) 113/59 (!) 144/62  Pulse: 79 66  Resp: 18 17  Temp: 98.5 F (36.9 C) 97.6 F (36.4 C)  SpO2: 95% 97%   9: Hyperlipidemia: continue Zetia, Pravachol, heart healthy diet 10: CAD: stable, no CP 11: Alzheimer's dementia; follows with Dr. Felecia Shelling. Does not tolerate donepezil 12: Diastolic CHF: stable, no SOB or edema, no signs of overload 13: Insomnia: continue  melatonin>>need to give by 7-7:30 pm nightly- one daughter requests to not awaken pt for turning at night , the other daughter is ok with skin care protocol 14: Left nipple skin change: no sign of infection; follow-up as outpatient 15: Urinary incontinence: possibly chronic component due to dementia             -check PVR's  -  timed voids as possible    LOS: 13 days A FACE TO FACE EVALUATION WAS PERFORMED  Meredith Staggers 06/10/2022, 10:33 AM

## 2022-06-10 NOTE — Progress Notes (Signed)
Occupational Therapy Session Note  Patient Details  Name: Katelyn Powers MRN: [id number] Date of Birth: 1931-11-08  Today's Date: 06/10/2022 OT Individual Time: 0945-1100 OT Individual Time Calculation (min): 75 min    Short Term Goals: Week 2:  OT Short Term Goal 1 (Week 2): Pt will consistently rise to stand to prep for toileting with mod A of 1. OT Short Term Goal 2 (Week 2): Pt will consistently transfer to toilet with mod A of 1 or less. OT Short Term Goal 3 (Week 2): Pt will don overhead shirt with mod A. OT Short Term Goal 4 (Week 2): Pt will demonstrate improved attention to brush her teeth with mod A or less.  Skilled Therapeutic Interventions/Progress Updates:   Pt received in hospital bed upon OT clinician arrival for session. Pt awake and alert. Pt seen for am self care and functional mobility focussed session with training components to include visual/perceptual, cognitive, communication and B integration skills. OT initiated self care bed level then moved to sink side with intermittent supported standing as needed. Pt required mod-max multimodal cues throughout session to improve follow through of clinician support and guidance for step by step by step simple ADL's and functional mobility. Pt was able to initiate limb lifting while performing bed level incontinence brief change and pants donning and even assisting to pull pants up over knees. Min A and max cues for rolling and supine to sit to EOB in prep for Steady transfer. Once in w/c, OT facilitated sit to and from stand  x 4 trials for pulling up pants and pt stood with min-mod A overall.  Button up shirt with mod A overall for steps of donning and max A for buttoning small blouse buttons but + initiation of B UE integration and midline attention. Hair care with mod cues for thoroughness and to completely reach to posterior of head. Mouth care with detailed focus of each step with significant improvement noted once task initiated as  pt initially placed toothbrush up to eyebrows but with guidance then able to complete 2/4 steps with min A and cues. Lipstick application with mod cues only. OT transported pt to middle gym via w/c however pt did perform 3 intervals of 15 ft self propulsion of w/c given max fading to min vc's with OT as target giving motor demo. Pt completed simple alternating 2 color large peg design with R and L UE with max cues for 12 peg sequence. Once back to room, pt left bedside with nurse call button paddle, chair alarm engaged and needs within reach.   Therapy Documentation Precautions:  Precautions Precautions: Fall, Other (comment) Precaution Comments: L inattention, hx of Alzheimer's Restrictions Weight Bearing Restrictions: No   Therapy/Group: Individual Therapy  Barnabas Lister 06/10/2022, 7:53 AM

## 2022-06-10 NOTE — Progress Notes (Signed)
Physical Therapy Session Note  Patient Details  Name: Katelyn Powers MRN: [id number] Date of Birth: September 09, 1932  Today's Date: 06/10/2022 PT Co-Treatment Time: 1330-1345 PT Co-Treatment Time Calculation (min): 15 min  Short Term Goals: Week 2:  PT Short Term Goal 1 (Week 2): Pt will consistently perform sit<>stand with mod assist PT Short Term Goal 2 (Week 2): Pt will ambulate 45f with mod assist PT Short Term Goal 3 (Week 2): Pt will perform bed mobility with mod assist consistently PT Short Term Goal 4 (Week 2): pt will trasnfer to and from bed with Mod assist and no +2 with RW  Skilled Therapeutic Interventions/Progress Updates: Pt performed multiple sit to stand transfers w/ mod A.  Pt will reach for Hi-lo table to transfer if it is available but if moved away, will push from w/c arm rests.  Pt follows only 1-step commands.  Pt stood at hi-lo table w/ co-treat w/ OT reaching for colored items as requested, but inattentive to left side.  Pt standing > 5' each trial w/o knee buckling, although last trial, pt leaning onto L FA 2/2 fatigue.  Pt reaches for items w/ R hand although final trial did attempt reaching w/ both UEs.  Pt remained sitting in w/c w/ chair alarm on and all needs in reach, family member present.       Therapy Documentation Precautions:  Precautions Precautions: Fall, Other (comment) Precaution Comments: L inattention, hx of Alzheimer's Restrictions Weight Bearing Restrictions: No General:   Vital Signs:  Pain: no c/o pain, appears pain-free w/ activity requested.       Therapy/Group: Co-Treatment  JLadoris Gene8/14/2023, 2:17 PM

## 2022-06-11 MED ORDER — MELATONIN 5 MG PO TABS
10.0000 mg | ORAL_TABLET | Freq: Every evening | ORAL | Status: DC
  Administered 2022-06-11 – 2022-06-18 (×8): 10 mg via ORAL
  Filled 2022-06-11 (×8): qty 2

## 2022-06-11 NOTE — Progress Notes (Signed)
Occupational Therapy Session Note  Patient Details  Name: Katelyn Powers MRN: [id number] Date of Birth: 22-Dec-1931  Today's Date: 06/11/2022 OT Individual Time: 1000-1115 OT Individual Time Calculation (min): 75 min    Short Term Goals: Week 1:  OT Short Term Goal 1 (Week 1): Pt will trasnfer to toilet with MOD A and LRAD OT Short Term Goal 1 - Progress (Week 1): Not progressing (pt is very inconsistent. Some days she can do a max of 1 other days she is total A with stedy.) OT Short Term Goal 2 (Week 1): Pt will locate 3/5 neded ADL items on L of center to demo improved L attention OT Short Term Goal 2 - Progress (Week 1): Progressing toward goal OT Short Term Goal 3 (Week 1): Pt will initate 1/4 steps of UB dressing OT Short Term Goal 3 - Progress (Week 1): Met OT Short Term Goal 4 (Week 1): Pt will complete oral care with MOD cuing for sequencing OT Short Term Goal 4 - Progress (Week 1): Not progressing (continues to need hand over hand A) Week 2:  OT Short Term Goal 1 (Week 2): Pt will consistently rise to stand to prep for toileting with mod A of 1. OT Short Term Goal 2 (Week 2): Pt will consistently transfer to toilet with mod A of 1 or less. OT Short Term Goal 3 (Week 2): Pt will don overhead shirt with mod A. OT Short Term Goal 4 (Week 2): Pt will demonstrate improved attention to brush her teeth with mod A or less.  Skilled Therapeutic Interventions/Progress Updates:    Pt received in bed sleeping, it took several minutes to wake her up.  Played 1950s swing music to try to stimulate her.  For first half of session, rehab tech not available. [name] brief was wet with urine so LB self care performed bed level with total A for cleansing, bathing LB and donning pants. Rehab tech arrived and pt sat to EOB with max A of 2.  Once sitting, pt more alert and was able to engage in UB bathing and dressing. She did don her pullover shirt with only one cue to fully pull down over her torso.   Cued pt  to stand to RW. She needed counting cues and mod A to stand up to RW. Cued pt to move to w/c.  Min - mod a guiding pt to step to w/c.   Attempted a walk with tech having wc behind her and this OT facilitating walk. Pt did stand with mod A but then could not motor plan to take steps.  Max cues to take small shuffling steps but pt kept trying to shove walker forward and then she sat in chair suddenly. Pt then moved to sink to have her engage in oral care at sink with min A and mod cues. Pt applied lipstick.  Her daughter arrived and asked very realistic questions about her mom's prognosis.  The daughter is aware of how her mom is walking further distances but she needs mod A.  She wants to start thinking about practical options for her at home, such as using a stedy or even learning how to use a hoyer lift.  Discussed BSC options, she would like to have a wider BSC.  Daughter also asking about how to have her mom changed to a DNR status.  Informed SW of her questions.  Pt resting in w.c and dtr was about to help her with her hair.  Therapy Documentation Precautions:  Precautions Precautions: Fall, Other (comment) Precaution Comments: L inattention, hx of Alzheimer's Restrictions Weight Bearing Restrictions: No   Pain: Pain Assessment Pain Scale: 0-10 Pain Score: 0-No pain    Therapy/Group: Individual Therapy  [place] 06/11/2022, 12:19 PM

## 2022-06-11 NOTE — Progress Notes (Signed)
Physical Therapy Session Note  Patient Details  Name: Katelyn Powers MRN: [id number] Date of Birth: [birth year]  Today's Date: 06/11/2022 PT Individual Time: 7062-3762 PT Individual Time Calculation (min): 47 min   Short Term Goals: Week 2:  PT Short Term Goal 1 (Week 2): Pt will consistently perform sit<>stand with mod assist PT Short Term Goal 2 (Week 2): Pt will ambulate 68f with mod assist PT Short Term Goal 3 (Week 2): Pt will perform bed mobility with mod assist consistently PT Short Term Goal 4 (Week 2): pt will trasnfer to and from bed with Mod assist and no +2 with RW  Skilled Therapeutic Interventions/Progress Updates:    Pt received sitting in w/c asleep with her daughter, TLynelle Smoke present but upon awakening appears agreeable to therapy session. Per OT recommendation and pt's family request this therapist provided manual hoyer lift training. Educated on hHydrographic surveyorand how to don/doff U-sling in wheelchair vs in bed. [name] daughter, TLynelle Smoke is very hands-on throughout all training and demonstrated understanding. Performed wheelchair to bed manual hoyer transfer with therapist providing step-by-step instructions to  pt's daughter and educating on need for +2 assist primarily when transferring from bed into wheelchair to allow 1 person to lift pt into a more "seated" posture while 2nd person lowers the hoyer. Pt tolerated the transfer very well, resting with her eyes closed during. Once supine in bed, pt quickly starts to fall asleep. [name] daughter reports significant ease of transferring pt using this device compared to physically lifting the pt placing family and pt at risk for injury - will notify SW that pt will require hoyer lift at D/C. Discussed that this therapist would leave w/c recommendations up to primary PT. [name] daughter [facility] given how quickly she fell asleep in the bed. Pt left with needs in reach, bed alarm on, and family  present.  Therapy Documentation Precautions:  Precautions Precautions: Fall, Other (comment) Precaution Comments: L inattention, hx of Alzheimer's Restrictions Weight Bearing Restrictions: No  Pain: No reports of pain throughout session.    Therapy/Group: Individual Therapy  CTawana Scale, PT, DPT, NCS, CSRS 06/11/2022, 12:30 PM

## 2022-06-11 NOTE — Progress Notes (Signed)
PROGRESS NOTE   Subjective/Complaints:  Pt sleeping but awakens to voice, oriented to self (baseline)  ROS: limited by cognitive/linguistic  Objective:   No results found. Recent Labs    06/10/22 0536  WBC 8.3  HGB 12.8  HCT 38.3  PLT 196    Recent Labs    06/10/22 0536  NA 137  K 4.0  CL 105  CO2 23  GLUCOSE 126*  BUN 29*  CREATININE 1.03*  CALCIUM 9.8    No intake or output data in the 24 hours ending 06/11/22 2197       Physical Exam: Vital Signs Blood pressure 133/60, pulse 66, temperature 97.9 F (36.6 C), resp. rate 16, height '5\' 2"'$  (1.575 m), weight 68.3 kg, SpO2 98 %.   General: No acute distress Mood and affect are appropriate Heart: Regular rate and rhythm no rubs murmurs or extra sounds Lungs: Clear to auscultation, breathing unlabored, no rales or wheezes Abdomen: Positive bowel sounds, soft nontender to palpation, nondistended Extremities: No clubbing, cyanosis, or edema Skin: No evidence of breakdown, no evidence of rash   Skin: intact Neurologic:  awake, alert. motor strength is 4/5 in bilateral deltoid, bicep, tricep, grip, hip flexor, knee extensors, ankle dorsiflexor and plantar flexor Left neglect evident.  Cannot assess sensation due to cog def Musculoskeletal: no pain with range of motion in Upper extremities. No joint swelling   Assessment/Plan: 1. Functional deficits which require 3+ hours per day of interdisciplinary therapy in a comprehensive inpatient rehab setting. Physiatrist is providing close team supervision and 24 hour management of active medical problems listed below. Physiatrist and rehab team continue to assess barriers to discharge/monitor patient progress toward functional and medical goals  Care Tool:  Bathing    Body parts bathed by patient: Right arm, Left arm, Chest, Abdomen, Face   Body parts bathed by helper: Front perineal area, Buttocks      Bathing assist Assist Level: Moderate Assistance - Patient 50 - 74%     Upper Body Dressing/Undressing Upper body dressing   What is the patient wearing?: Button up shirt    Upper body assist Assist Level: Moderate Assistance - Patient 50 - 74%    Lower Body Dressing/Undressing Lower body dressing      What is the patient wearing?: Pants, Incontinence brief     Lower body assist Assist for lower body dressing: Total Assistance - Patient < 25%     Toileting Toileting    Toileting assist Assist for toileting: 2 Helpers     Transfers Chair/bed transfer  Transfers assist  Chair/bed transfer activity did not occur: Safety/medical concerns  Chair/bed transfer assist level: Dependent - mechanical lift (stedy)     Locomotion Ambulation   Ambulation assist      Assist level: 2 helpers Assistive device: Other (comment) (3 Musketeer) Max distance: 93f   Walk 10 feet activity   Assist     Assist level: 2 helpers Assistive device: Other (comment) (3 Musketeer)   Walk 50 feet activity   Assist Walk 50 feet with 2 turns activity did not occur: Safety/medical concerns         Walk 150 feet activity   Assist  Walk 150 feet activity did not occur: Safety/medical concerns         Walk 10 feet on uneven surface  activity   Assist Walk 10 feet on uneven surfaces activity did not occur: Safety/medical concerns         Wheelchair     Assist Is the patient using a wheelchair?: Yes Type of Wheelchair: Manual    Wheelchair assist level: Dependent - Patient 0%      Wheelchair 50 feet with 2 turns activity    Assist        Assist Level: Dependent - Patient 0%   Wheelchair 150 feet activity     Assist      Assist Level: Dependent - Patient 0%   Blood pressure 133/60, pulse 66, temperature 97.9 F (36.6 C), resp. rate 16, height '5\' 2"'$  (1.575 m), weight 68.3 kg, SpO2 98 %. Medical Problem List and Plan: 1. Functional  deficits secondary to right thalamic infarct PCA distribution ; probable Alzheimer's plus vascular dementia and prior stroke/TIA with left arm weakness which has improved  and left hemianopsia             -patient may shower             -ELOS/Goals: 8/23 min assist goals,   Continue CIR- PT, OT and SLP- team conf in am     -have reduced to 15/7- due to easy fatiguability, would rec concentrating OT, PT in afternoon, leaning consistent with stroke location with Left neglect  2.  Antithrombotics: -DVT/anticoagulation:  Pharmaceutical: Lovenox  -keep an eye on platelets, trending down slightly             -antiplatelet therapy: Plavix and aspirin 81 mg (family declines Brilinta) 3. Pain Management: Tylenol as needed 4. Mood/Behavior/Sleep: LCSW to evaluate and provide emotional support             -antipsychotic agents: n/a 5. Neuropsych/cognition: This patient is not capable of making decisions on her own behalf. 6. Skin/Wound Care: Routine skin care checks 7. Fluids/Electrolytes/Nutrition: Routine I's and O's and follow-up chemistries             -heart healthy diet/thin liq; crush meds in pureed Poor fluid intake will recheck BMET in am   8/5- Last Cr 8/4 was 1.21- stable- BUN slightly more elevated at 29- will recheck Monday and if getting worse, might need IVFs  8/14 BUN still elevated at 29. Push fluids  -no IVF at this point 8: Hypertension: continue amlodipine 5 mg daily             -Cozaar 50 mg daily is being held  8/15 controlled  Vitals:   06/10/22 1526 06/11/22 0337  BP:  133/60  Pulse: 85 66  Resp: 18 16  Temp: 98 F (36.7 C) 97.9 F (36.6 C)  SpO2: 97% 98%   9: Hyperlipidemia: continue Zetia, Pravachol, heart healthy diet 10: CAD: stable, no CP 11: Alzheimer's dementia; follows with Dr. Felecia Shelling. Does not tolerate donepezil 12: Diastolic CHF: stable, no SOB or edema, no signs of overload 13: Insomnia: continue melatonin>>need to give by 7-7:30 pm nightly- one daughter  requests to not awaken pt for turning at night , the other daughter is ok with skin care protocol 14: Left nipple skin change: no sign of infection; follow-up as outpatient 15: Urinary incontinence: possibly chronic component due to dementia             -check PVR's  -  timed voids as  possible    LOS: 14 days A FACE TO FACE EVALUATION WAS PERFORMED  Charlett Blake 06/11/2022, 8:12 AM

## 2022-06-11 NOTE — Plan of Care (Signed)
Problem: RH Balance Goal: LTG: Patient will maintain dynamic sitting balance (OT) Description: LTG:  Patient will maintain dynamic sitting balance with assistance during activities of daily living (OT) 06/11/2022 1248 by Harlene Ramus, OT Note: LTG downgraded due to limited awareness, attention, memory, and praxis. 06/11/2022 1242 by Harlene Ramus, OT Flowsheets (Taken 06/11/2022 1242) LTG: Pt will maintain dynamic sitting balance during ADLs with: (LTG downgraded due to limited awareness, attention, memory, and praxis.) Contact Guard/Touching assist Note: LTG downgraded due to limited awareness, attention, memory, and praxis. Goal: LTG Patient will maintain dynamic standing with ADLs (OT) Description: LTG:  Patient will maintain dynamic standing balance with assist during activities of daily living (OT)  06/11/2022 1248 by Harlene Ramus, OT Note: LTG downgraded due to limited awareness, attention, memory, and praxis. 06/11/2022 1242 by Harlene Ramus, OT Flowsheets (Taken 06/11/2022 1242) LTG: Pt will maintain dynamic standing balance during ADLs with: (LTG downgraded due to limited awareness, attention, memory, and praxis.) Moderate Assistance - Patient 50 - 74% Note: LTG downgraded due to limited awareness, attention, memory, and praxis.   Problem: RH Grooming Goal: LTG Patient will perform grooming w/assist,cues/equip (OT) Description: LTG: Patient will perform grooming with assist, with/without cues using equipment (OT) 06/11/2022 1248 by Harlene Ramus, OT Note: LTG downgraded due to limited awareness, attention, memory, and praxis. 06/11/2022 1242 by Harlene Ramus, OT Flowsheets (Taken 06/11/2022 1242) LTG: Pt will perform grooming with assistance level of: (LTG downgraded due to limited awareness, attention, memory, and praxis.) Minimal Assistance - Patient > 75% Note: LTG downgraded due to limited awareness, attention, memory, and praxis.   Problem: RH Dressing Goal:  LTG Patient will perform lower body dressing w/assist (OT) Description: LTG: Patient will perform lower body dressing with assist, with/without cues in positioning using equipment (OT) 06/11/2022 1248 by Harlene Ramus, OT Note: LTG downgraded due to limited awareness, attention, memory, and praxis. 06/11/2022 1242 by Harlene Ramus, OT Flowsheets (Taken 06/11/2022 1242) LTG: Pt will perform lower body dressing with assistance level of: (LTG downgraded due to limited awareness, attention, memory, and praxis.) Maximal Assistance - Patient 25 - 49% Note: LTG downgraded due to limited awareness, attention, memory, and praxis.   Problem: RH Toileting Goal: LTG Patient will perform toileting task (3/3 steps) with assistance level (OT) Description: LTG: Patient will perform toileting task (3/3 steps) with assistance level (OT)  06/11/2022 1248 by Harlene Ramus, OT Note: LTG downgraded due to limited awareness, attention, memory, and praxis. 06/11/2022 1242 by Harlene Ramus, OT Flowsheets (Taken 06/11/2022 1242) LTG: Pt will perform toileting task (3/3 steps) with assistance level: (LTG downgraded due to limited awareness, attention, memory, and praxis.) Total Assistance - Patient < 25% Note: LTG downgraded due to limited awareness, attention, memory, and praxis.   Problem: RH Toilet Transfers Goal: LTG Patient will perform toilet transfers w/assist (OT) Description: LTG: Patient will perform toilet transfers with assist, with/without cues using equipment (OT) 06/11/2022 1248 by Harlene Ramus, OT Note: LTG downgraded due to limited awareness, attention, memory, and praxis. 06/11/2022 1242 by Harlene Ramus, OT Flowsheets (Taken 06/11/2022 1242) LTG: Pt will perform toilet transfers with assistance level of: (LTG downgraded due to limited awareness, attention, memory, and praxis.) Moderate Assistance - Patient 50 - 74% Note: LTG downgraded due to limited awareness, attention, memory, and  praxis.   Problem: RH Attention Goal: LTG Patient will demonstrate this level of attention during functional activites (OT) Description: LTG:  Patient will demonstrate this  level of attention during functional activites  (OT) Flowsheets (Taken 06/11/2022 1248) LTG: Patient will demonstrate this level of attention during functional activites (OT): (LTG downgraded due to limited awareness, attention, memory, and praxis.) Moderate Assistance - Patient 50 - 74% Note: LTG downgraded due to limited awareness, attention, memory, and praxis.

## 2022-06-12 DIAGNOSIS — Z515 Encounter for palliative care: Secondary | ICD-10-CM

## 2022-06-12 DIAGNOSIS — Z7189 Other specified counseling: Secondary | ICD-10-CM

## 2022-06-12 LAB — BASIC METABOLIC PANEL
Anion gap: 11 (ref 5–15)
BUN: 27 mg/dL — ABNORMAL HIGH (ref 8–23)
CO2: 24 mmol/L (ref 22–32)
Calcium: 10.4 mg/dL — ABNORMAL HIGH (ref 8.9–10.3)
Chloride: 104 mmol/L (ref 98–111)
Creatinine, Ser: 1.09 mg/dL — ABNORMAL HIGH (ref 0.44–1.00)
GFR, Estimated: 49 mL/min — ABNORMAL LOW (ref >60)
Glucose, Bld: 127 mg/dL — ABNORMAL HIGH (ref 70–99)
Potassium: 4.3 mmol/L (ref 3.5–5.1)
Sodium: 139 mmol/L (ref 135–145)

## 2022-06-12 NOTE — Progress Notes (Signed)
PROGRESS NOTE   Subjective/Complaints:  No issues overnite   ROS: limited by cognitive/linguistic  Objective:   No results found. Recent Labs    06/10/22 0536  WBC 8.3  HGB 12.8  HCT 38.3  PLT 196    Recent Labs    06/10/22 0536  NA 137  K 4.0  CL 105  CO2 23  GLUCOSE 126*  BUN 29*  CREATININE 1.03*  CALCIUM 9.8     Intake/Output Summary (Last 24 hours) at 06/12/2022 0809 Last data filed at 06/11/2022 1904 Gross per 24 hour  Intake 220 ml  Output --  Net 220 ml         Physical Exam: Vital Signs Blood pressure (!) 141/75, pulse 67, temperature 98 F (36.7 C), resp. rate 16, height '5\' 2"'  (1.575 m), weight 68.3 kg, SpO2 99 %.   General: No acute distress Mood and affect are appropriate Heart: Regular rate and rhythm no rubs murmurs or extra sounds Lungs: Clear to auscultation, breathing unlabored, no rales or wheezes Abdomen: Positive bowel sounds, soft nontender to palpation, nondistended Extremities: No clubbing, cyanosis, or edema Skin: No evidence of breakdown, no evidence of rash  Neurologic:  awake, alert. motor strength is 4/5 in bilateral deltoid, bicep, tricep, grip, hip flexor, knee extensors, ankle dorsiflexor and plantar flexor Left neglect evident.  Cannot assess sensation due to cog def Musculoskeletal: no pain with range of motion in Upper extremities. No joint swelling   Assessment/Plan: 1. Functional deficits which require 3+ hours per day of interdisciplinary therapy in a comprehensive inpatient rehab setting. Physiatrist is providing close team supervision and 24 hour management of active medical problems listed below. Physiatrist and rehab team continue to assess barriers to discharge/monitor patient progress toward functional and medical goals  Care Tool:  Bathing    Body parts bathed by patient: Right arm, Left arm, Chest, Abdomen, Face   Body parts bathed by helper:  Front perineal area, Buttocks, Right upper leg, Left upper leg, Right lower leg, Left lower leg     Bathing assist Assist Level: Moderate Assistance - Patient 50 - 74%     Upper Body Dressing/Undressing Upper body dressing   What is the patient wearing?: Button up shirt, Pull over shirt (Supervision with pullover, mod with button up)    Upper body assist Assist Level: Moderate Assistance - Patient 50 - 74%    Lower Body Dressing/Undressing Lower body dressing      What is the patient wearing?: Pants, Incontinence brief     Lower body assist Assist for lower body dressing: Total Assistance - Patient < 25%     Toileting Toileting    Toileting assist Assist for toileting: 2 Helpers     Transfers Chair/bed transfer  Transfers assist  Chair/bed transfer activity did not occur: Safety/medical concerns  Chair/bed transfer assist level: Moderate Assistance - Patient 50 - 74%     Locomotion Ambulation   Ambulation assist      Assist level: 2 helpers Assistive device: Other (comment) (3 Musketeer) Max distance: 26f   Walk 10 feet activity   Assist     Assist level: 2 helpers Assistive device: Other (comment) (3 Musketeer)  Walk 50 feet activity   Assist Walk 50 feet with 2 turns activity did not occur: Safety/medical concerns         Walk 150 feet activity   Assist Walk 150 feet activity did not occur: Safety/medical concerns         Walk 10 feet on uneven surface  activity   Assist Walk 10 feet on uneven surfaces activity did not occur: Safety/medical concerns         Wheelchair     Assist Is the patient using a wheelchair?: Yes Type of Wheelchair: Manual    Wheelchair assist level: Dependent - Patient 0%      Wheelchair 50 feet with 2 turns activity    Assist        Assist Level: Dependent - Patient 0%   Wheelchair 150 feet activity     Assist      Assist Level: Dependent - Patient 0%   Blood pressure  (!) 141/75, pulse 67, temperature 98 F (36.7 C), resp. rate 16, height '5\' 2"'  (1.575 m), weight 68.3 kg, SpO2 99 %. Medical Problem List and Plan: 1. Functional deficits secondary to right thalamic infarct PCA distribution ; probable Alzheimer's plus vascular dementia and prior stroke/TIA with left arm weakness which has improved  and left hemianopsia             -patient may shower             -ELOS/Goals: 8/23 min assist goals,   Continue CIR- PT, OT and SLP- Team conference today please see physician documentation under team conference tab, met with team  to discuss problems,progress, and goals. Formulized individual treatment plan based on medical history, underlying problem and comorbidities.      2.  Antithrombotics: -DVT/anticoagulation:  Pharmaceutical: Lovenox  -keep an eye on platelets, trending down slightly             -antiplatelet therapy: Plavix and aspirin 81 mg (family declines Brilinta) 3. Pain Management: Tylenol as needed 4. Mood/Behavior/Sleep: LCSW to evaluate and provide emotional support             -antipsychotic agents: n/a 5. Neuropsych/cognition: This patient is not capable of making decisions on her own behalf. 6. Skin/Wound Care: Routine skin care checks 7. Fluids/Electrolytes/Nutrition: Routine I's and O's and follow-up chemistries             -heart healthy diet/thin liq; crush meds in pureed Poor fluid intake will recheck BMET in am   8/5- Last Cr 8/4 was 1.21- stable- BUN slightly more elevated at 29- will recheck Monday and if getting worse, might need IVFs  8/14 BUN still elevated at 29. Push fluids  -no IVF at this point 8: Hypertension: continue amlodipine 5 mg daily             -Cozaar 50 mg daily is being held  8/16 controlled  Vitals:   06/11/22 2052 06/12/22 0421  BP: 129/64 (!) 141/75  Pulse: 69 67  Resp: 16 16  Temp: 97.9 F (36.6 C) 98 F (36.7 C)  SpO2: 97% 99%   9: Hyperlipidemia: continue Zetia, Pravachol, heart healthy diet 10:  CAD: stable, no CP 11: Alzheimer's dementia; follows with Dr. Felecia Shelling. Does not tolerate donepezil 12: Diastolic CHF: stable, no SOB or edema, no signs of overload 13: Insomnia: continue melatonin>>need to give by 7-7:30 pm nightly- one daughter requests to not awaken pt for turning at night , the other daughter is ok with skin care protocol  14: Left nipple skin change: no sign of infection; follow-up as outpatient 15: Urinary incontinence: possibly chronic component due to dementia             -check PVR's  -  timed voids as possible  16.  [place] meeting with palliative .  LOS: 15 days A FACE TO FACE EVALUATION WAS PERFORMED  Charlett Blake 06/12/2022, 8:09 AM

## 2022-06-12 NOTE — Patient Care Conference (Signed)
Inpatient RehabilitationTeam Conference and Plan of Care Update Date: 06/12/2022   Time: 10:19 AM   Patient Name: Katelyn Powers      Medical Record Number: [id number]  Date of Birth: Sep 13, 1932 Sex: Female         Room/Bed: 4W10C/4W10C-01 Payor Info: Payor: Theme park manager MEDICARE / Plan: [employer]HC MEDICARE / Product Type: *No Product type* /    Admit Date/Time:  05/28/2022  2:51 PM  Primary Diagnosis:  Cerebrovascular accident (CVA) of right thalamus [facility])  Hospital Problems: Principal Problem:   Cerebrovascular accident (CVA) of right thalamus [facility])    Expected Discharge Date: Expected Discharge Date: 06/19/22  Team Members Present: Physician leading conference: Dr. Alysia Penna Social Worker Present: [name], [place] Nurse Present: Dorien Chihuahua, RN PT Present: Barrie Folk, PT OT Present: Meriel Pica, OT SLP Present: Sherren Kerns, SLP PPS Coordinator present : Gunnar Fusi, SLP     Current Status/Progress Goal Weekly Team Focus  Bowel/Bladder   Pt is incontinent of bowel/bladder  Pt will regain continence of bowel/bladder  Will assess qshift and PRN   Swallow/Nutrition/ Hydration             ADL's   inconsistent perfomance, total A with LB self care, max transfers, UB bathing supervision, UB dressing min A, sit to stands fluctuate from min -mod A  LTGs downgraded, mod A toilet transfers; max LB dressing and toileting.  Bathing goal remains mod A  ADL training, family education, functional mobility, visual scanning   Mobility   inconsistent mobility. can perform bed mobility, transfers and gait with RW. but dependent on arousal and attention withing session .  goals downgraded to mod assist due to slow progress towards LTG  family education. transfer training. sustained attention. improved balance, endurnace. arousal.   Communication             Safety/Cognition/ Behavioral Observations            Pain   Pt denies pain  Pt will remain pain free  Will  assess qshift and PRN   Skin   [name] skin is intact  [name] skin will remain intact  Will assess qshift and PRN     Discharge Planning:  Discharging home with her daughter Katelyn Powers. Both daughter able to provide supervion and assistance at d/c.   Team Discussion: Patient with poor attention and cognition issues/dementia PTA, now with sensory deficits, apraxia and motor planning issues.  Patient on target to meet rehab goals: Currently needs mod assist for transfers and ambulation up to 75-100'. Functional level fluctuates min - mod assist when she is awake and able to attend to the task.   *See Care Plan and progress notes for long and short-term goals.   Revisions to Treatment Plan:  Palliative Care Consult Downgraded goals to Mod assist   Teaching Needs: Safety, medications, transfers, toileting, etc  Current Barriers to Discharge: Decreased caregiver support  Possible Resolutions to Barriers: Family education HH follow up services DME: W/C, Hoyer, Hospital bed, RW] [facility] recommended     Medical Summary Current Status: Alzheimer's dementia, RIght PCA infarct, HTN controlled  Barriers to Discharge: Other (comments)  Barriers to Discharge Comments: family requesting [place] meeting Possible Resolutions to Barriers/Weekly Focus: consult palliative care, family training this week   Continued Need for Acute Rehabilitation Level of Care: The patient requires daily medical management by a physician with specialized training in physical medicine and rehabilitation for the following reasons: Direction of a multidisciplinary physical rehabilitation program to maximize  functional independence : Yes Medical management of patient stability for increased activity during participation in an intensive rehabilitation regime.: Yes Analysis of laboratory values and/or radiology reports with any subsequent need for medication adjustment and/or medical intervention. : Yes   I attest that I was  present, lead the team conference, and concur with the assessment and plan of the team.   Dorien Chihuahua B 06/12/2022, 3:26 PM

## 2022-06-12 NOTE — Progress Notes (Signed)
Occupational Therapy Session Note  Patient Details  Name: Katelyn Powers MRN: [id number] Date of Birth: [birth year]  Today's Date: 06/12/2022 OT Individual Time: 1007-1219 and 1030-1100 OT Individual Time Calculation (min): 45 min and 30 min    Short Term Goals: Week 1:  OT Short Term Goal 1 (Week 1): Pt will trasnfer to toilet with MOD A and LRAD OT Short Term Goal 1 - Progress (Week 1): Not progressing (pt is very inconsistent. Some days she can do a max of 1 other days she is total A with stedy.) OT Short Term Goal 2 (Week 1): Pt will locate 3/5 neded ADL items on L of center to demo improved L attention OT Short Term Goal 2 - Progress (Week 1): Progressing toward goal OT Short Term Goal 3 (Week 1): Pt will initate 1/4 steps of UB dressing OT Short Term Goal 3 - Progress (Week 1): Met OT Short Term Goal 4 (Week 1): Pt will complete oral care with MOD cuing for sequencing OT Short Term Goal 4 - Progress (Week 1): Not progressing (continues to need hand over hand A) Week 2:  OT Short Term Goal 1 (Week 2): Pt will consistently rise to stand to prep for toileting with mod A of 1. OT Short Term Goal 2 (Week 2): Pt will consistently transfer to toilet with mod A of 1 or less. OT Short Term Goal 3 (Week 2): Pt will don overhead shirt with mod A. OT Short Term Goal 4 (Week 2): Pt will demonstrate improved attention to brush her teeth with mod A or less.   Skilled Therapeutic Interventions/Progress Updates:    Visit 1:  Pain: no c/o pain Pt received in bed smiling and alert.  No family present. Pt needed reassurance that it was ok with her daughter that I work with her. +2 A present from rehab tech.  Pt needed max A to sit to EOB.  To enable pt to practice using a stedy, which her daughter will use most of the time at home, had pt use stedy lift to toilet.  +2 A but pt was able to pull to stand with mod A.  Pt transported to toilet.  Pt had a wet brief, but did urinate more on toilet. She had a  bowel movement but needed A from nurse as it was partially "stuck".  Pt needed total A with cleansing.  Sitting on toilet, pt worked on UB bathing and dressing with min A.  Total A with LB dressing.  Had pt sit on regular toilet as it was better for her height.  Pt was able to pull to stand with mod A using grab bar.  Used stedy again to transport her back to w/c.  Pt sitting in wc with all needs met and belt alarm on.   Visit 2:  Pain: no c/o pain no family present. Left hand outs of possible equipment they can purchase. Pt in w/c and taken to day room to work on visual scanning and standing tolerance. At elevated table and with demonstration and counting cues, pt able to come to stand with min A but then only tolerated standing for 1 min at a time. Pt worked on scanning table for colored objects with mod cues.  Pt stood 3 x, each consecutive time less time, 45 sec, 30 sec.  Pt taken back to room. Belt alarm on and telesitter on.  Therapy Documentation Precautions:  Precautions Precautions: Fall, Other (comment) Precaution Comments: L inattention, hx of  Alzheimer's Restrictions Weight Bearing Restrictions: No     Therapy/Group: Individual Therapy  [place] 06/12/2022, 8:33 AM

## 2022-06-12 NOTE — Progress Notes (Signed)
Physical Therapy Session Note  Patient Details  Name: Katelyn Powers MRN: [id number] Date of Birth: Jun 07, 1932  Today's Date: 06/12/2022 PT Individual Time: 1304-1400 PT Individual Time Calculation (min): 56 min   Short Term Goals:  Week 2:  PT Short Term Goal 1 (Week 2): Pt will consistently perform sit<>stand with mod assist PT Short Term Goal 2 (Week 2): Pt will ambulate 31f with mod assist PT Short Term Goal 3 (Week 2): Pt will perform bed mobility with mod assist consistently PT Short Term Goal 4 (Week 2): pt will trasnfer to and from bed with Mod assist and no +2 with RW   Skilled Therapeutic Interventions/Progress Updates:   Pt received sitting in WC and agreeable to PT.  Daughter present for part of PT session to assess ability to mobilize at home.   [facility] and gait training with RW 2 x 442f PT required to direct RW dirction and max cues for sequencing of turns to prepare for [facility] Standing balance while engaged in visual scanning task on bits CGA for balance and initially hand over hand assist to initiate touching target on screec progressing to supervision assist with moderate cues for visual scanning to the L x 3 bouts for 1 min each.    [facility] Education for daughter on use of hoyer for dependent transfer back to bed. Pt left supine in bed with call bell in reach and all needs met.       Therapy Documentation Precautions:  Precautions Precautions: Fall, Other (comment) Precaution Comments: L inattention, hx of Alzheimer's Restrictions Weight Bearing Restrictions: No    Vital Signs: Therapy Vitals Temp: 97.9 F (36.6 C) Pulse Rate: 72 Resp: 15 BP: (!) 112/52 Patient Position (if appropriate): Lying Oxygen Therapy SpO2: 97 % O2 Device: Room Air Pain: Faces: none.     Therapy/Group: Individual Therapy  AuLorie Phenix/16/2023, 4:08 PM

## 2022-06-12 NOTE — Progress Notes (Signed)
Patient ID: Katelyn Powers, female   DOB: [birth year], 88 y.o.   MRN: [id number]  This SW covering for primary [employer].   1257-SW spoke with pt dtr [name] to provide updates from team conference, and d/c date remains 8/23. SW discussed d/c recommendations: hoyer lift, hospital bed, w/c, and RW. Reports they have a RW and 3in1 Bsc already. SW will order other items with [employer]. SW discussed HHPT/OT/SLP/aide at d/c. She is aware [name] will follow-up about HHA preference. When discussing palliative care consult, she reports that she is hopeful this will give them insight on how to plan for there mother's care and when appropriate for changing code status. Denies she would like her mother to be a DNR at this time. SW shared, palliative care will follow-up with her. She feels comfortable with the amount of family edu at this point. States her sister may need fam edu, and to follow-up with her. Reports she will be here today and Friday.   SW ordered DME with [employer] via parachute.    [name], MSW, [place] Office: [phone] Cell: [phone] Fax: [phone]

## 2022-06-12 NOTE — Discharge Instructions (Addendum)
Inpatient Rehab Discharge Instructions  Katelyn Powers Discharge date and time:  06/19/2022  Activities/Precautions/ Functional Status: Activity: no lifting, driving, or strenuous exercise until cleared by MD Diet: cardiac diet; crush pills Wound Care: none needed Functional status:  ___ No restrictions     ___ Walk up steps independently ___ 24/7 supervision/assistance   ___ Walk up steps with assistance ___ Intermittent supervision/assistance  ___ Bathe/dress independently ___ Walk with walker     ___ Bathe/dress with assistance ___ Walk Independently    ___ Shower independently ___ Walk with assistance    ___ Shower with assistance ___ No alcohol     ___ Return to work/school ________  Special Instructions: No driving, alcohol consumption or tobacco use.   STROKE/TIA DISCHARGE INSTRUCTIONS SMOKING Cigarette smoking nearly doubles your risk of having a stroke & is the single most alterable risk factor  If you smoke or have smoked in the last 12 months, you are advised to quit smoking for your health. Most of the excess cardiovascular risk related to smoking disappears within a year of stopping. Ask you doctor about anti-smoking medications [place] Quit Line: 1-800-QUIT NOW Free Smoking Cessation Classes [phone]  CHOLESTEROL Know your levels; limit fat & cholesterol in your diet  Lipid Panel     Component Value Date/Time   CHOL 137 05/26/2022 0742   TRIG 181 (H) 05/26/2022 0742   HDL 46 05/26/2022 0742   CHOLHDL 3.0 05/26/2022 0742   VLDL 36 05/26/2022 0742   LDLCALC 55 05/26/2022 0742     Many patients benefit from treatment even if their cholesterol is at goal. Goal: Total Cholesterol (CHOL) less than 160 Goal:  Triglycerides (TRIG) less than 150 Goal:  HDL greater than 40 Goal:  LDL (LDLCALC) less than 100   BLOOD PRESSURE American Stroke Association blood pressure target is less that 120/80 mm/Hg  Your discharge blood pressure is:  BP: (!) 140/74 Monitor your blood  pressure Limit your salt and alcohol intake Many individuals will require more than one medication for high blood pressure  DIABETES (A1c is a blood sugar average for last 3 months) Goal HGBA1c is under 7% (HBGA1c is blood sugar average for last 3 months)  Diabetes: No known diagnosis of diabetes    Lab Results  Component Value Date   HGBA1C 6.3 (H) 05/26/2022    Your HGBA1c can be lowered with medications, healthy diet, and exercise. Check your blood sugar as directed by your physician Call your physician if you experience unexplained or low blood sugars.  PHYSICAL ACTIVITY/REHABILITATION Goal is 30 minutes at least 4 days per week  Activity: Increase activity slowly, Therapies: Physical Therapy: Home Health, Occupational Therapy: Home Health, and Speech Therapy: Home Health Return to work: n/a Activity decreases your risk of heart attack and stroke and makes your heart stronger.  It helps control your weight and blood pressure; helps you relax and can improve your mood. Participate in a regular exercise program. Talk with your doctor about the best form of exercise for you (dancing, walking, swimming, cycling).  DIET/WEIGHT Goal is to maintain a healthy weight  Your discharge diet is:  Diet Order             Diet Heart Room service appropriate? Yes; Fluid consistency: Thin  Diet effective now                  thin liquids Your height is:  Height: '5\' 2"'$  (157.5 cm) Your current weight is: Weight: 68.3 kg  Your Body Mass Index (BMI) is:  BMI (Calculated): 27.53 Following the type of diet specifically designed for you will help prevent another stroke. Your goal weight range is:   Your goal Body Mass Index (BMI) is 19-24. Healthy food habits can help reduce 3 risk factors for stroke:  High cholesterol, hypertension, and excess weight.  RESOURCES Stroke/Support Group:  Call [phone]   STROKE EDUCATION PROVIDED/REVIEWED AND GIVEN TO PATIENT Stroke warning signs and  symptoms How to activate emergency medical system (call 911). Medications prescribed at discharge. Need for follow-up after discharge. Personal risk factors for stroke. Pneumonia vaccine given: No Flu vaccine given: No My questions have been answered, the writing is legible, and I understand these instructions.  I will adhere to these goals & educational materials that have been provided to me after my discharge from the hospital.      My questions have been answered and I understand these instructions. I will adhere to these goals and the provided educational materials after my discharge from the hospital.  Patient/Caregiver Signature _______________________________ Date __________  Clinician Signature _______________________________________ Date __________  Please bring this form and your medication list with you to all your follow-up doctor's appointments.

## 2022-06-12 NOTE — Plan of Care (Signed)
  Problem: Sit to Stand Goal: LTG:  Patient will perform sit to stand with assistance level (PT) Description: LTG:  Patient will perform sit to stand with assistance level (PT) Flowsheets (Taken 06/12/2022 1520) LTG: PT will perform sit to stand in preparation for functional mobility with assistance level: Moderate Assistance - Patient 50 - 74% Note: Downgraded due to slow progress   Problem: RH Bed Mobility Goal: LTG Patient will perform bed mobility with assist (PT) Description: LTG: Patient will perform bed mobility with assistance, with/without cues (PT). Flowsheets (Taken 06/12/2022 1520) LTG: Pt will perform bed mobility with assistance level of: Moderate Assistance - Patient 50 - 74% Note: Downgraded due to slow progress   Problem: RH Bed to Chair Transfers Goal: LTG Patient will perform bed/chair transfers w/assist (PT) Description: LTG: Patient will perform bed to chair transfers with assistance (PT). Flowsheets (Taken 06/12/2022 1520) LTG: Pt will perform Bed to Chair Transfers with assistance level: Moderate Assistance - Patient 50 - 74% Note: Downgraded due to slow progress   Problem: RH Car Transfers Goal: LTG Patient will perform car transfers with assist (PT) Description: LTG: Patient will perform car transfers with assistance (PT). Flowsheets (Taken 06/12/2022 1520) LTG: Pt will perform car transfers with assist:: Moderate Assistance - Patient 50 - 74% Note: Downgraded due to slow progress   Problem: RH Ambulation Goal: LTG Patient will ambulate in controlled environment (PT) Description: LTG: Patient will ambulate in a controlled environment, # of feet with assistance (PT). Flowsheets Taken 06/12/2022 1520 by Lorie Phenix, PT LTG: Pt will ambulate in controlled environ  assist needed:: Moderate Assistance - Patient 50 - 74% Taken 05/29/2022 2316 by Tawana Scale, PT LTG: Ambulation distance in controlled environment: 69f using LRAD Note: Downgraded due to slow  progress Goal: LTG Patient will ambulate in home environment (PT) Description: LTG: Patient will ambulate in home environment, # of feet with assistance (PT). Flowsheets Taken 06/12/2022 1520 by TLorie Phenix PT LTG: Pt will ambulate in home environ  assist needed:: Moderate Assistance - Patient 50 - 74% Taken 05/29/2022 2316 by PTawana Scale PT LTG: Ambulation distance in home environment: 31fusing LRAD Note: Downgraded due to slow progress

## 2022-06-12 NOTE — Consult Note (Signed)
Consultation Note Date: 06/12/2022   Patient Name: Katelyn Powers  DOB: 12-28-1931  MRN: [id number]  Age / Sex: 88 y.o., female  PCP: Cyndi Bender, PA-C Referring Physician: Charlett Blake, MD  Reason for Consultation: Establishing goals of care  HPI/Patient Profile: 88 y.o. female  with past medical history of Alzheimer's dementia, CVA, multiple TIAs, melanoma, hypertension, CHF, and carotid stenosis admitted on 05/28/2022 to CIR with CVA.  Patient continues with significant functional deficits.  PMT consulted for goals of care discussion per daughter's request.   Clinical Assessment and Goals of Care: I have reviewed medical records including EPIC notes, labs and imaging, assessed the patient and then met with daughter Katelyn Powers in person and daughter Katelyn Powers over the phone to discuss diagnosis prognosis, GOC, EOL wishes, disposition and options.  I introduced Palliative Medicine as specialized medical care for people living with serious illness. It focuses on providing relief from the symptoms and stress of a serious illness. The goal is to improve quality of life for both the patient and the family.  As far as functional and nutritional status he shared baseline patient could dress and feed herself.  Daughters report that her current status is far from baseline.   We discussed patient's current illness and what it means in the larger context of patient's on-going co-morbidities.  Natural disease trajectory and expectations at EOL were discussed.  Daughters expressed their understanding that she is at risk for ongoing complications.  Daughter shares her concerns about her p.o. intake -she is only eating if she is being fed.  I attempted to elicit values and goals of care important to the patient.  Daughters tell me they are most interested in having the patient at home and getting extra support in the home to care for her.  The difference between  aggressive medical intervention and comfort care was considered in light of the patient's goals of care.   We discussed supportive home health versus hospice in the home.  We discussed that generally home health focuses on rehabilitation and hospice focuses on comfort and quality of life.  Daughters expressed that their goals of care are more in line with hospice support.  We discussed uncertain prognosis but high risk for ongoing complications.  We briefly discussed CODE STATUS.  They share they are not ready to make a decision about this and would like more time to consider.  Questions and concerns were addressed. The family was encouraged to call with questions or concerns.  Primary Decision Maker NEXT OF KIN -daughters Katelyn Powers and Katelyn Powers    SUMMARY OF RECOMMENDATIONS   Daughters express interest in support of hospice upon discharge -referral given to hospice liaison -discussed with social work Daughter is not ready to make decision on CODE STATUS, would like time to consider  Code Status/Advance Care Planning: Full code  Symptom Management:  Per CIR, patient denies pain  Discharge Planning: Home with Hospice      Primary Diagnoses: Present on Admission:  Cerebrovascular accident (CVA) of right thalamus ([place])   I have reviewed the medical record, interviewed the patient and family, and examined the patient. The following aspects are pertinent.  Past Medical History:  Diagnosis Date   Cataract    Gout    Hyperlipidemia    Hypertension    Memory loss    NSTEMI (non-ST elevated myocardial infarction) ([place]) 08/2020   Right-sided carotid artery disease ([place]) 09/17/2020   carotid doppler wth >60% occlusion   Social History  Socioeconomic History   Marital status: Widowed    Spouse name: Not on file   Number of children: Not on file   Years of education: Not on file   Highest education level: Not on file  Occupational History   Not on file  Tobacco Use   Smoking  status: Former   Smokeless tobacco: Never  Vaping Use   Vaping Use: Never used  Substance and Sexual Activity   Alcohol use: No    Alcohol/week: 0.0 standard drinks of alcohol   Drug use: No   Sexual activity: Not on file  Other Topics Concern   Not on file  Social History Narrative   Not on file   Social Determinants of Health   Financial Resource Strain: Not on file  Food Insecurity: Not on file  Transportation Needs: Not on file  Physical Activity: Not on file  Stress: Not on file  Social Connections: Not on file   Family History  Problem Relation Age of Onset   Colon cancer Sister 57   Colon polyps Brother    Scheduled Meds:  amLODipine  5 mg Oral Daily   aspirin EC  81 mg Oral Daily   clopidogrel  75 mg Oral Daily   enoxaparin (LOVENOX) injection  30 mg Subcutaneous Q24H   ezetimibe  10 mg Oral Daily   melatonin  10 mg Oral QPM   pravastatin  40 mg Oral q1800   Continuous Infusions: PRN Meds:.acetaminophen, alum & mag hydroxide-simeth, bisacodyl, diphenhydrAMINE, guaiFENesin-dextromethorphan, magnesium hydroxide, methocarbamol, mouth rinse, prochlorperazine **OR** prochlorperazine **OR** prochlorperazine, sodium phosphate, traZODone No Known Allergies Review of Systems  Unable to perform ROS: Dementia    Physical Exam Constitutional:      General: She is not in acute distress. Pulmonary:     Effort: Pulmonary effort is normal.  Skin:    General: Skin is warm and dry.  Neurological:     Mental Status: She is alert. She is disoriented.     Vital Signs: BP (!) 140/74   Pulse 67   Temp 98 F (36.7 C)   Resp 16   Ht '5\' 2"'  (1.575 m)   Wt 68.3 kg   SpO2 99%   BMI 27.54 kg/m  Pain Scale: Faces   Pain Score: 0-No pain   SpO2: SpO2: 99 % O2 Device:SpO2: 99 % O2 Flow Rate: .   IO: Intake/output summary:  Intake/Output Summary (Last 24 hours) at 06/12/2022 1407 Last data filed at 06/12/2022 1350 Gross per 24 hour  Intake 328 ml  Output --  Net  328 ml    LBM: Last BM Date : 06/07/22 Baseline Weight: Weight: 68.3 kg Most recent weight: Weight: 68.3 kg     Palliative Assessment/Data: PPS 50%      *Please note that this is a verbal dictation therefore any spelling or grammatical errors are due to the "[place] One" system interpretation.   Juel Burrow, DNP, AGNP-C Palliative Medicine Team [phone] Pager: [phone]

## 2022-06-13 NOTE — Progress Notes (Signed)
Physical Therapy Session Note  Patient Details  Name: Katelyn Powers MRN: [id number] Date of Birth: 1932-01-24  Today's Date: 06/13/2022 PT Individual Time: 7628-3151 PT Individual Time Calculation (min): 55 min   Short Term Goals: Week 1:  PT Short Term Goal 1 (Week 1): Pt will perform supine<>sit with mod assist using bed features PT Short Term Goal 1 - Progress (Week 1): Met PT Short Term Goal 2 (Week 1): Pt will perform sit<>stand transfers using LRAD with max assist of 1 PT Short Term Goal 2 - Progress (Week 1): Met PT Short Term Goal 3 (Week 1): Pt will perform bed<>chair transfers using LRAD with mod assist without lift equipment PT Short Term Goal 3 - Progress (Week 1): Met PT Short Term Goal 4 (Week 1): Pt will ambulate at least 68f using LRAD with +2 mod assist PT Short Term Goal 4 - Progress (Week 1): Met Week 2:  PT Short Term Goal 1 (Week 2): Pt will consistently perform sit<>stand with mod assist PT Short Term Goal 2 (Week 2): Pt will ambulate 59fwith mod assist PT Short Term Goal 3 (Week 2): Pt will perform bed mobility with mod assist consistently PT Short Term Goal 4 (Week 2): pt will trasnfer to and from bed with Mod assist and no +2 with RW Week 3:     Skilled Therapeutic Interventions/Progress Updates:   Pt received supine in bed and agreeable to PT. PT and rehab tech assisted with eating sitting up in bed.  Supine>sit transfer with mod assist and increased time.   Dressing from EOB with max assist to initiate all movements on this day. Stand [facility] due to poor initiation and poor Ad management.    Gait training with RW x 8022fod assist throughout with cues for posture and AD management as well as cues to continue mobility as pt attempting to stop in path of automatic door..  Venida Jarvisciprocal movement and sustained attention training  2 x 2 min with cues from PT for full ROM and attention to task. Stand pivot transfer to and from  nustep with mod-max assist to achieve full standing on this day. .   Patient returned to room and left [facility] in [facility] in reach and all needs met.      Therapy Documentation Precautions:  Precautions Precautions: Fall, Other (comment) Precaution Comments: L inattention, hx of Alzheimer's Restrictions Weight Bearing Restrictions: No General:   Vital Signs: Therapy Vitals Temp: 97.8 F (36.6 C) Temp Source: Oral Pulse Rate: 68 Resp: 16 BP: (!) 111/97 Patient Position (if appropriate): Lying Oxygen Therapy SpO2: 99 % O2 Device: Room Air Pain: deines    Therapy/Group: Individual Therapy  AusLorie Phenix17/2023, 9:07 AM

## 2022-06-13 NOTE — Progress Notes (Signed)
Occupational Therapy Session Note  Patient Details  Name: Katelyn Powers MRN: [id number] Date of Birth: [birth year]  Today's Date: 06/13/2022 OT Individual Time: 1415-1530 OT Individual Time Calculation (min): 75 min    Short Term Goals: Week 2:  OT Short Term Goal 1 (Week 2): Pt will consistently rise to stand to prep for toileting with mod A of 1. OT Short Term Goal 2 (Week 2): Pt will consistently transfer to toilet with mod A of 1 or less. OT Short Term Goal 3 (Week 2): Pt will don overhead shirt with mod A. OT Short Term Goal 4 (Week 2): Pt will demonstrate improved attention to brush her teeth with mod A or less.   Skilled Therapeutic Interventions/Progress Updates:  Pt seen for pm OT session with dtr and grand dtr present for informal in-room Family Education for strategies for toileting, grooming sink side and ways to incorporate exercise, neuro re-ed and visual/perceptual components into home setting in preparation for discharge. Initiated toileting with Therapy Tech Laney for attempts to void and have BM on 3 in 1 over toilet with no output success. Pt did initiate sit to stand with max cues and forward weight shifts and momentum with B UE support on bars. Max A for incontinence brief, pants management and peri hygiene. Sinkside level mouth care in supported standing with mod A and able to complete 2/4 steps with min A and cues.  OT training with B UE ball cane type exercises for closed chain awareness for scap protraction/retraction, sh flex/ex, elbow fle/ext and horizontal abd/add 2 sets of 10 reps with mod cues and demo. OT repeated a previous activity to determine carryover and dtr to purchase pegs, foam Legos and clothes pin activity for home. Pt completed simple alternating 2 color large peg design with R and L UE with mod- max cues for 12 peg sequence. Pt transferred back to bed and left bedside with nurse call button paddle, bed alarm engaged and needs within reach.  Therapy  Documentation Precautions:  Precautions Precautions: Fall, Other (comment) Precaution Comments: L inattention, hx of Alzheimer's Restrictions Weight Bearing Restrictions: No    Therapy/Group: Individual Therapy  Barnabas Lister 06/13/2022, 9:11 PM

## 2022-06-13 NOTE — Progress Notes (Signed)
PROGRESS NOTE   Subjective/Complaints:  Pt denies knee , pain oriented to self only   ROS: limited by cognitive/linguistic  Objective:   No results found. No results for input(s): "WBC", "HGB", "HCT", "PLT" in the last 72 hours.  Recent Labs    06/12/22 0740  NA 139  K 4.3  CL 104  CO2 24  GLUCOSE 127*  BUN 27*  CREATININE 1.09*  CALCIUM 10.4*     Intake/Output Summary (Last 24 hours) at 06/13/2022 0859 Last data filed at 06/12/2022 1829 Gross per 24 hour  Intake 338 ml  Output --  Net 338 ml         Physical Exam: Vital Signs Blood pressure (!) 111/97, pulse 68, temperature 97.8 F (36.6 C), temperature source Oral, resp. rate 16, height '5\' 2"'$  (1.575 m), weight 68.3 kg, SpO2 99 %.   General: No acute distress Mood and affect are appropriate Heart: Regular rate and rhythm no rubs murmurs or extra sounds Lungs: Clear to auscultation, breathing unlabored, no rales or wheezes Abdomen: Positive bowel sounds, soft nontender to palpation, nondistended Extremities: No clubbing, cyanosis, or edema Skin: No evidence of breakdown, no evidence of rash  Neurologic:  awake, alert. motor strength is 4/5 in bilateral deltoid, bicep, tricep, grip, hip flexor, knee extensors, ankle dorsiflexor and plantar flexor Left neglect evident.  Cannot assess sensation due to cog def Musculoskeletal: no pain with range of motion in Upper extremities. No joint swelling   Assessment/Plan: 1. Functional deficits which require 3+ hours per day of interdisciplinary therapy in a comprehensive inpatient rehab setting. Physiatrist is providing close team supervision and 24 hour management of active medical problems listed below. Physiatrist and rehab team continue to assess barriers to discharge/monitor patient progress toward functional and medical goals  Care Tool:  Bathing    Body parts bathed by patient: Right arm, Left arm,  Chest, Abdomen, Face   Body parts bathed by helper: Front perineal area, Buttocks, Right upper leg, Left upper leg, Right lower leg, Left lower leg     Bathing assist Assist Level: Moderate Assistance - Patient 50 - 74%     Upper Body Dressing/Undressing Upper body dressing   What is the patient wearing?: Button up shirt, Pull over shirt (Supervision with pullover, mod with button up)    Upper body assist Assist Level: Moderate Assistance - Patient 50 - 74%    Lower Body Dressing/Undressing Lower body dressing      What is the patient wearing?: Pants, Incontinence brief     Lower body assist Assist for lower body dressing: Total Assistance - Patient < 25%     Toileting Toileting    Toileting assist Assist for toileting: 2 Helpers     Transfers Chair/bed transfer  Transfers assist  Chair/bed transfer activity did not occur: Safety/medical concerns  Chair/bed transfer assist level: Moderate Assistance - Patient 50 - 74%     Locomotion Ambulation   Ambulation assist      Assist level: 2 helpers Assistive device: Other (comment) (3 Musketeer) Max distance: 79f   Walk 10 feet activity   Assist     Assist level: 2 helpers Assistive device: Other (comment) (3  Musketeer)   Walk 50 feet activity   Assist Walk 50 feet with 2 turns activity did not occur: Safety/medical concerns         Walk 150 feet activity   Assist Walk 150 feet activity did not occur: Safety/medical concerns         Walk 10 feet on uneven surface  activity   Assist Walk 10 feet on uneven surfaces activity did not occur: Safety/medical concerns         Wheelchair     Assist Is the patient using a wheelchair?: Yes Type of Wheelchair: Manual    Wheelchair assist level: Dependent - Patient 0%      Wheelchair 50 feet with 2 turns activity    Assist        Assist Level: Dependent - Patient 0%   Wheelchair 150 feet activity     Assist       Assist Level: Dependent - Patient 0%   Blood pressure (!) 111/97, pulse 68, temperature 97.8 F (36.6 C), temperature source Oral, resp. rate 16, height '5\' 2"'$  (1.575 m), weight 68.3 kg, SpO2 99 %. Medical Problem List and Plan: 1. Functional deficits secondary to right thalamic infarct PCA distribution ; probable Alzheimer's plus vascular dementia and prior stroke/TIA with left arm weakness which has improved  and left hemianopsia             -patient may shower             -ELOS/Goals: 8/23 min assist goals,   Continue CIR- PT, OT and SLP- minimal functional gains emphasizing family training      2.  Antithrombotics: -DVT/anticoagulation:  Pharmaceutical: Lovenox  -keep an eye on platelets, trending down slightly             -antiplatelet therapy: Plavix and aspirin 81 mg (family declines Brilinta) 3. Pain Management: Tylenol as needed 4. Mood/Behavior/Sleep: LCSW to evaluate and provide emotional support             -antipsychotic agents: n/a 5. Neuropsych/cognition: This patient is not capable of making decisions on her own behalf. 6. Skin/Wound Care: Routine skin care checks 7. Fluids/Electrolytes/Nutrition: Routine I's and O's and follow-up chemistries             -heart healthy diet/thin liq; crush meds in pureed Poor fluid intake will recheck BMET in am   8/5- Last Cr 8/4 was 1.21- stable- BUN slightly more elevated at 29- will recheck Monday and if getting worse, might need IVFs  8/14 BUN still elevated at 29. Push fluids  -no IVF at this point 8: Hypertension: continue amlodipine 5 mg daily             -Cozaar 50 mg daily is being held  8/16 controlled  Vitals:   06/12/22 1930 06/13/22 0516  BP: (!) 139/49 (!) 111/97  Pulse: 75 68  Resp: 18 16  Temp: (!) 97.5 F (36.4 C) 97.8 F (36.6 C)  SpO2: 96% 99%   9: Hyperlipidemia: continue Zetia, Pravachol, heart healthy diet 10: CAD: stable, no CP 11: Alzheimer's dementia; follows with Dr. Felecia Shelling. Does not tolerate  donepezil, severe cognitive dysfunction - GOC discussion per palliative  12: Diastolic CHF: stable, no SOB or edema, no signs of overload 13: Insomnia: continue melatonin>>need to give by 7-7:30 pm nightly- one daughter requests to not awaken pt for turning at night , the other daughter is ok with skin care protocol 14: Left nipple skin change: no sign of infection;  follow-up as outpatient 15: Urinary incontinence: possibly chronic component due to dementia             -check PVR's  -  timed voids as possible  16.  [place] meeting with palliative . Borderline hypercalcemia, will monitor not receiving supplements LOS: 16 days A FACE TO FACE EVALUATION WAS PERFORMED  Charlett Blake 06/13/2022, 8:59 AM

## 2022-06-13 NOTE — Progress Notes (Signed)
[facility] Liaison Note  Received request from Juel Burrow, NP PMT and [name] LCSW [facility] for hospice services at home after discharge. Spoke with patient's daughters, Lynelle Smoke and [name] to initiate education related to hospice philosophy, services, and team approach to care. Patient's family verbalized understanding of information given. Per discussion, the plan is for discharge to home on Wednesday, 8.23.23 via PTAR/EMS.   DME needs discussed.  Patient has the following equipment in the home: walker and bedside commode   Family requests the following equipment for delivery: hospital bed, hoyer lift with "u" shaped sling, wheelchair, and bedside table  The address has been verified and is correct in the chart. The daughter, Lynelle Smoke [phone] is the family contact to arrange time of equipment delivery.   Please provide prescriptions at discharge as needed to ensure ongoing symptom management.   [facility] information and contact numbers provided to patient's daughters.   Above information shared with PMT NP and TOC.   Please call with any questions or concerns.   Thank you for the opportunity to participate in this patient's care.  [place]  [facility] Liaison  [phone]

## 2022-06-13 NOTE — Progress Notes (Signed)
Daily Progress Note   Patient Name: Katelyn Powers       Date: 06/13/2022 DOB: 08/28/1932  Age: 88 y.o. MRN#: [id number] Attending Physician: Charlett Blake, MD Primary Care Physician: Cyndi Bender, PA-C Admit Date: 05/28/2022  Reason for Consultation/Follow-up: Establishing goals of care  Subjective: Patient minimally interactive, daughter [name] at bedside  Length of Stay: 16  Current Medications: Scheduled Meds:   amLODipine  5 mg Oral Daily   aspirin EC  81 mg Oral Daily   clopidogrel  75 mg Oral Daily   enoxaparin (LOVENOX) injection  30 mg Subcutaneous Q24H   ezetimibe  10 mg Oral Daily   melatonin  10 mg Oral QPM   pravastatin  40 mg Oral q1800    Continuous Infusions:   PRN Meds: acetaminophen, alum & mag hydroxide-simeth, bisacodyl, diphenhydrAMINE, guaiFENesin-dextromethorphan, magnesium hydroxide, methocarbamol, mouth rinse, prochlorperazine **OR** prochlorperazine **OR** prochlorperazine, sodium phosphate, traZODone  Physical Exam Constitutional:      General: She is not in acute distress.    Appearance: She is ill-appearing.     Comments: Sitting in wheelchair, keeps eyes shut majority of my visit  Pulmonary:     Effort: Pulmonary effort is normal.  Skin:    General: Skin is warm and dry.  Neurological:     Mental Status: She is disoriented.             Vital Signs: BP (!) 111/97 (BP Location: Left Arm)   Pulse 68   Temp 97.8 F (36.6 C) (Oral)   Resp 16   Ht 5' 2" (1.575 m)   Wt 68.3 kg   SpO2 99%   BMI 27.54 kg/m  SpO2: SpO2: 99 % O2 Device: O2 Device: Room Air O2 Flow Rate:    Intake/output summary:  Intake/Output Summary (Last 24 hours) at 06/13/2022 1440 Last data filed at 06/13/2022 1428 Gross per 24 hour  Intake 397 ml  Output --  Net 397 ml    LBM: Last BM Date : 06/12/22 Baseline Weight: Weight: 68.3 kg Most recent weight: Weight: 68.3 kg       Palliative Assessment/Data: PPS 50%      Patient Active Problem List   Diagnosis Date Noted   Cerebrovascular accident (CVA) of right thalamus ([place]) 05/28/2022   Elevated troponin level not due myocardial infarction 05/26/2022   Acute stroke due to ischemia ([facility]) 05/25/2022   Chronic diastolic CHF (congestive heart failure) ([place]) 05/25/2022   Coronary artery disease involving native coronary artery of native heart without angina pectoris 05/25/2022   Acute ischemic stroke ([place]) 10/23/2021   SBO (small bowel obstruction) ([place]) 09/25/2021   Late onset Alzheimer's disease without behavioral disturbance ([place]) 02/22/2021   Stenosis of intracranial vessel 02/22/2021   CVA (cerebral vascular accident) ([place]) 11/07/2020   Hypotension 09/06/2020   NSTEMI (non-ST elevated myocardial infarction) ([place]) 09/04/2020   Vitamin D deficiency 10/27/2019   Excessive sleepiness 10/27/2019   Essential hypertension 07/27/2018   Disorder of arteries and arterioles ([place]) 07/27/2018   Mixed hyperlipidemia 07/27/2018   Memory loss 07/27/2018   Insomnia 07/27/2018   Abnormal brain MRI 07/27/2018   Carotid stenosis, right 07/27/2018    Palliative Care Assessment & Plan  HPI:  88 y.o. female  with past medical history of Alzheimer's dementia, CVA, multiple TIAs, melanoma, hypertension, CHF, and carotid stenosis admitted on 05/28/2022 to CIR with CVA.  Patient continues with significant functional deficits.  PMT consulted for goals of care discussion per daughter's request.  Assessment: Met with daughter at bedside. Reviewed conversation from yesterday. She has spoke with hospice liaisons a few times - all of her questions addressed by them. No additional questions. We review patient's condition. Daughter notes she feels that patient is declining - she discusses patient's extreme fatigue and  limited interaction.  We discuss uncertainty of prognosis but certainly at risk for acute decline.   Recommendations/Plan: Home with hospice - likely dc 8/23 - hospice liaisons involved and assisting - daughters have our number and will call with any needs for inpatient palliative team  Code Status: Full code  Discharge Planning: Home with Hospice  Care plan was discussed with patient's daughter, hospice liaison  Thank you for allowing the Palliative Medicine Team to assist in the care of this patient.  *Please note that this is a verbal dictation therefore any spelling or grammatical errors are due to the "[place] One" system interpretation.  Juel Burrow, DNP, [facility] Palliative Medicine Team Team Phone # [phone]  Pager [phone]

## 2022-06-14 NOTE — Progress Notes (Signed)
[facility] 4W[address] [facility])  Hospital liaison   Visited patient at bedside. Patient was sitting in wheelchair, sleeping. Will continue to follow peripherally for anticipated discharge on 8.23.23.   Don't hesitate to call with any questions.  Thanks,  Zigmund Gottron RN  [facility] Liaison  [phone]

## 2022-06-14 NOTE — Progress Notes (Signed)
Patient ID: Katelyn Powers, female   DOB: 1932/07/19, 88 y.o.   MRN: [id number]  Daughters anticipate d/c home with hospice on 8/23. DME ordered through [facility] set for delivery 8/22.

## 2022-06-14 NOTE — Progress Notes (Signed)
Occupational Therapy Weekly Progress Note  Patient Details  Name: Katelyn Powers MRN: [id number] Date of Birth: [birth year]  Beginning of progress report period: June 07, 2022 End of progress report period: June 14, 2022  Today's Date: 06/14/2022 OT Individual Time: 0915-1000 OT Individual Time Calculation (min): 45 min    Patient has met 2 of 5 short term goals.  Pt has improved with overall consistency with function however LTG's have been adjusted by primary OT clinician to reflect. See below.   Patient continues to demonstrate the following deficits: muscle weakness, decreased cardiorespiratoy endurance, impaired timing and sequencing, unbalanced muscle activation, decreased coordination, and decreased motor planning, decreased visual perceptual skills and decreased visual motor skills, decreased attention to left and decreased motor planning, decreased initiation, decreased attention, decreased awareness, decreased problem solving, decreased safety awareness, decreased memory, and delayed processing, and decreased sitting balance, decreased standing balance, decreased postural control, and decreased balance strategies and therefore will continue to benefit from skilled OT intervention to enhance overall performance with BADL, iADL, and Reduce care partner burden.  Patient not progressing toward long term goals.  See goal revision. Primary OT downgraded LTG's to reflect current function, inconsistencies and level of care expected upon d/c which aligns with current status.  Continue plan of care.  OT Short Term Goals Week 2:  OT Short Term Goal 1 (Week 2): Pt will consistently rise to stand to prep for toileting with mod A of 1. OT Short Term Goal 1 - Progress (Week 2): Progressing toward goal OT Short Term Goal 2 (Week 2): Pt will consistently transfer to toilet with mod A of 1 or less. OT Short Term Goal 2 - Progress (Week 2): Progressing toward goal OT Short Term Goal 3 (Week 2): Pt  will don overhead shirt with mod A. OT Short Term Goal 3 - Progress (Week 2): Met OT Short Term Goal 4 (Week 2): Pt will demonstrate improved attention to brush her teeth with mod A or less. OT Short Term Goal 4 - Progress (Week 2): Met Week 3:  OT Short Term Goal 2 (Week 3): [name] family will report feeling educated and comfortable with HEP and ADL and mobility strategies in prep for d/c OT Short Term Goal 3 (Week 3): Pt will scan to L side >25 % of time with mod cues during basic ADL or visual/cog/motor activity wih mod VC's OT Short Term Goal 4 (Week 3): Pt will perform standing with improved consistency for LB garment, peri/buttocks hygiene and brief changes with 75 % consistency and mod A or less OT Short Term Goal 5 (Week 3): Pt will complete oral care with mod cues, set up and CGA.  Skilled Therapeutic Interventions/Progress Updates:  Pt seen for skilled OT re-assessment for weekly progress and full am self care re-training with integration of standing skills, scanning and cognition, B integration and coordination. Pt was easily awakened for morning session and moved supine to sit at EOB with mod cues and mod fading to min A. Use of Steady to toilet for voiding and LB bathing and dressing with improved processing commands and output for sit to and from stand since past 2 sessions this OT worked with pt. Brought to sink for oral care and UB bathing and dressing. Pt able to follow prompts, cues and execution of teeth brushing with application and use of tooth brush with less than min A this session. UB bathing with mod fading to min A. Button up blouse and cardigan with + initiation  of Ue's through sleeves and support for bringing garment around body due to trunk weakness. Small buttons on blouse with max A but large cardigan buttons 2/6 with cues only. Pt left w/c level with NT, chair alarm set, busy apron in front of pt for stimulation and coordination and all needs and paddle call button in reach.    Therapy Documentation Precautions:  Precautions Precautions: Fall, Other (comment) Precaution Comments: L inattention, hx of Alzheimer's Restrictions Weight Bearing Restrictions: No    Therapy/Group: Individual Therapy  Barnabas Lister 06/14/2022, 6:06 AM

## 2022-06-14 NOTE — Progress Notes (Signed)
Physical Therapy Session Note  Patient Details  Name: Katelyn Powers MRN: [id number] Date of Birth: [birth year]  Today's Date: 06/14/2022 PT Individual Time: 2998-0699 PT Individual Time Calculation (min): 53 min   Short Term Goals: Week 1:  PT Short Term Goal 1 (Week 1): Pt will perform supine<>sit with mod assist using bed features PT Short Term Goal 1 - Progress (Week 1): Met PT Short Term Goal 2 (Week 1): Pt will perform sit<>stand transfers using LRAD with max assist of 1 PT Short Term Goal 2 - Progress (Week 1): Met PT Short Term Goal 3 (Week 1): Pt will perform bed<>chair transfers using LRAD with mod assist without lift equipment PT Short Term Goal 3 - Progress (Week 1): Met PT Short Term Goal 4 (Week 1): Pt will ambulate at least 43f using LRAD with +2 mod assist PT Short Term Goal 4 - Progress (Week 1): Met Week 2:  PT Short Term Goal 1 (Week 2): Pt will consistently perform sit<>stand with mod assist PT Short Term Goal 2 (Week 2): Pt will ambulate 597fwith mod assist PT Short Term Goal 3 (Week 2): Pt will perform bed mobility with mod assist consistently PT Short Term Goal 4 (Week 2): pt will trasnfer to and from bed with Mod assist and no +2 with RW Week 3:     Skilled Therapeutic Interventions/Progress Updates:   Pt received sitting in WC and agreeable to PT.  Transfer training.   Gait training.   Attempted 5xSTS without success.   Pt returned to room and performed ** transfer to bed with **. Sit>supine completed with ** and left supine in bed with call bell in reach and all needs met.       Therapy Documentation Precautions:  Precautions Precautions: Fall, Other (comment) Precaution Comments: L inattention, hx of Alzheimer's Restrictions Weight Bearing Restrictions: No General:   Vital Signs:   Pain:   Mobility:   Locomotion :    Trunk/Postural Assessment :    Balance:   Exercises:   Other Treatments:      Therapy/Group: Individual  Therapy  AuLorie Phenix/18/2023, 5:32 PM

## 2022-06-14 NOTE — Progress Notes (Signed)
PROGRESS NOTE   Subjective/Complaints: Appreciate, Palliative note.   ROS: limited by cognitive/linguistic  Objective:   No results found. No results for input(s): "WBC", "HGB", "HCT", "PLT" in the last 72 hours.  Recent Labs    06/12/22 0740  NA 139  K 4.3  CL 104  CO2 24  GLUCOSE 127*  BUN 27*  CREATININE 1.09*  CALCIUM 10.4*     Intake/Output Summary (Last 24 hours) at 06/14/2022 0850 Last data filed at 06/14/2022 0749 Gross per 24 hour  Intake 517 ml  Output --  Net 517 ml         Physical Exam: Vital Signs Blood pressure (!) 137/56, pulse 67, temperature 97.9 F (36.6 C), temperature source Oral, resp. rate 16, height '5\' 2"'$  (1.575 m), weight 68.3 kg, SpO2 97 %.   General: No acute distress Mood and affect are appropriate Heart: Regular rate and rhythm no rubs murmurs or extra sounds Lungs: Clear to auscultation, breathing unlabored, no rales or wheezes Abdomen: Positive bowel sounds, soft nontender to palpation, nondistended Extremities: No clubbing, cyanosis, or edema Skin: No evidence of breakdown, no evidence of rash  Neurologic:  awake, alert. motor strength is 4/5 in bilateral deltoid, bicep, tricep, grip, hip flexor, knee extensors, ankle dorsiflexor and plantar flexor Left neglect evident.  Cannot assess sensation due to cog def Musculoskeletal: no pain with range of motion in Upper extremities. No joint swelling   Assessment/Plan: 1. Functional deficits which require 3+ hours per day of interdisciplinary therapy in a comprehensive inpatient rehab setting. Physiatrist is providing close team supervision and 24 hour management of active medical problems listed below. Physiatrist and rehab team continue to assess barriers to discharge/monitor patient progress toward functional and medical goals  Care Tool:  Bathing    Body parts bathed by patient: Right arm, Left arm, Chest, Abdomen,  Face   Body parts bathed by helper: Front perineal area, Buttocks, Right upper leg, Left upper leg, Right lower leg, Left lower leg     Bathing assist Assist Level: Moderate Assistance - Patient 50 - 74%     Upper Body Dressing/Undressing Upper body dressing   What is the patient wearing?: Button up shirt, Pull over shirt (Supervision with pullover, mod with button up)    Upper body assist Assist Level: Moderate Assistance - Patient 50 - 74%    Lower Body Dressing/Undressing Lower body dressing      What is the patient wearing?: Pants, Incontinence brief     Lower body assist Assist for lower body dressing: Total Assistance - Patient < 25%     Toileting Toileting Toileting Activity did not occur Landscape architect and hygiene only): Refused  Toileting assist Assist for toileting: 2 Helpers     Transfers Chair/bed transfer  Transfers assist  Chair/bed transfer activity did not occur: Safety/medical concerns  Chair/bed transfer assist level: Moderate Assistance - Patient 50 - 74%     Locomotion Ambulation   Ambulation assist      Assist level: 2 helpers Assistive device: Other (comment) (3 Musketeer) Max distance: 42f   Walk 10 feet activity   Assist     Assist level: 2 helpers Assistive device: Other (  comment) (3 Musketeer)   Walk 50 feet activity   Assist Walk 50 feet with 2 turns activity did not occur: Safety/medical concerns         Walk 150 feet activity   Assist Walk 150 feet activity did not occur: Safety/medical concerns         Walk 10 feet on uneven surface  activity   Assist Walk 10 feet on uneven surfaces activity did not occur: Safety/medical concerns         Wheelchair     Assist Is the patient using a wheelchair?: Yes Type of Wheelchair: Manual    Wheelchair assist level: Dependent - Patient 0%      Wheelchair 50 feet with 2 turns activity    Assist        Assist Level: Dependent - Patient  0%   Wheelchair 150 feet activity     Assist      Assist Level: Dependent - Patient 0%   Blood pressure (!) 137/56, pulse 67, temperature 97.9 F (36.6 C), temperature source Oral, resp. rate 16, height '5\' 2"'$  (1.575 m), weight 68.3 kg, SpO2 97 %. Medical Problem List and Plan: 1. Functional deficits secondary to right thalamic infarct PCA distribution ; probable Alzheimer's plus vascular dementia and prior stroke/TIA with left arm weakness which has improved  and left hemianopsia             -patient may shower             -ELOS/Goals: 8/23 min assist goals,   Continue CIR- PT, OT and SLP- minimal functional gains emphasizing family training      2.  Antithrombotics: -DVT/anticoagulation:  Pharmaceutical: Lovenox  -keep an eye on platelets, trending down slightly             -antiplatelet therapy: Plavix and aspirin 81 mg (family declines Brilinta) 3. Pain Management: Tylenol as needed 4. Mood/Behavior/Sleep: LCSW to evaluate and provide emotional support             -antipsychotic agents: n/a 5. Neuropsych/cognition: This patient is not capable of making decisions on her own behalf. 6. Skin/Wound Care: Routine skin care checks 7. Fluids/Electrolytes/Nutrition: Routine I's and O's and follow-up chemistries             -heart healthy diet/thin liq; crush meds in pureed Poor fluid intake will recheck BMET in am   8/5- Last Cr 8/4 was 1.21- stable- BUN slightly more elevated at 29- will recheck Monday and if getting worse, might need IVFs  8/14 BUN still elevated at 29. Push fluids  -no IVF at this point 8: Hypertension: continue amlodipine 5 mg daily             -Cozaar 50 mg daily is being held  8/18 controlled  Vitals:   06/13/22 1952 06/14/22 0416  BP: 128/65 (!) 137/56  Pulse: 73 67  Resp: 16 16  Temp: 98.1 F (36.7 C) 97.9 F (36.6 C)  SpO2: 95% 97%   9: Hyperlipidemia: continue Zetia, Pravachol, heart healthy diet 10: CAD: stable, no CP 11: Alzheimer's dementia;  follows with Dr. Felecia Shelling. Does not tolerate donepezil, severe cognitive dysfunction - GOC discussion per palliative  12: Diastolic CHF: stable, no SOB or edema, no signs of overload 13: Insomnia: continue melatonin>>need to give by 7-7:30 pm nightly- one daughter requests to not awaken pt for turning at night , the other daughter is ok with skin care protocol 14: Left nipple skin change: no sign of infection;  follow-up as outpatient 15: Urinary incontinence: possibly chronic component due to dementia             -check PVR's  -  timed voids as possible  16.  [place] meeting with palliative . Plan is for home hospice, DNR status not changed thus far 17.  Borderline hypercalcemia, will monitor not receiving supplements LOS: 17 days A FACE TO FACE EVALUATION WAS PERFORMED  Charlett Blake 06/14/2022, 8:50 AM

## 2022-06-15 DIAGNOSIS — N3942 Incontinence without sensory awareness: Secondary | ICD-10-CM

## 2022-06-15 DIAGNOSIS — F0280 Dementia in other diseases classified elsewhere without behavioral disturbance: Secondary | ICD-10-CM

## 2022-06-15 DIAGNOSIS — G309 Alzheimer's disease, unspecified: Secondary | ICD-10-CM

## 2022-06-15 NOTE — Progress Notes (Signed)
Physical Therapy Weekly Progress Note  Patient Details  Name: Katelyn Powers MRN: [id number] Date of Birth: 09-01-1932  Beginning of progress report period: {Time; dates multiple:304500300} End of progress report period: {Time; dates multiple:304500300}  Today's Date: 06/15/2022 PT Individual Time: 1305-1400 PT Individual Time Calculation (min): 55 min   Patient has met {number 1-5:22450} of {number 1-5:20334} short term goals.  ***  Patient continues to demonstrate the following deficits {impairments:3041632} and therefore will continue to benefit from skilled PT intervention to increase functional independence with mobility.  Patient {LTG progression:3041653}.  {plan of MEQA:8341962}  PT Short Term Goals {IWL:7989211}  Skilled Therapeutic Interventions/Progress Updates:   Pt received sitting in WC and agreeable to PT  Gait training.   Dynamic balance and visual scanning.   Toiletting.   Pt returned to room and performed ** transfer to bed with **. Sit>supine completed with ** and left supine in bed with call bell in reach and all needs met.       Therapy Documentation Precautions:  Precautions Precautions: Fall, Other (comment) Precaution Comments: L inattention, hx of Alzheimer's Restrictions Weight Bearing Restrictions: No General:   Vital Signs: Therapy Vitals Temp: 98 F (36.7 C) Pulse Rate: 73 Resp: 16 BP: 132/71 Patient Position (if appropriate): Lying Oxygen Therapy SpO2: 95 % O2 Device: Room Air Pain:   Vision/Perception     Mobility:   Locomotion :    Trunk/Postural Assessment :    Balance:   Exercises:   Other Treatments:     Therapy/Group: {Therapy/Group:3049007}  Lorie Phenix 06/15/2022, 5:14 PM

## 2022-06-15 NOTE — Progress Notes (Signed)
Occupational Therapy Session Note  Patient Details  Name: Katelyn Powers MRN: [id number] Date of Birth: 10-24-1932  Today's Date: 06/15/2022 OT Individual Time: 0950-1016 & 1106 - 1215 OT Individual Time Calculation (min): 26 min & 69 min   Short Term Goals: Week 3:  OT Short Term Goal 2 (Week 3): [name] family will report feeling educated and comfortable with HEP and ADL and mobility strategies in prep for d/c OT Short Term Goal 3 (Week 3): Pt will scan to L side >25 % of time with mod cues during basic ADL or visual/cog/motor activity wih mod VC's OT Short Term Goal 4 (Week 3): Pt will perform standing with improved consistency for LB garment, peri/buttocks hygiene and brief changes with 75 % consistency and mod A or less OT Short Term Goal 5 (Week 3): Pt will complete oral care with mod cues, set up and CGA.  First Session [phone] - [phone]) - Skilled Therapeutic Interventions/Progress Updates:  Pt asleep in w/c upon OT arrival to the room. Pt reports, "That's enough of that," while walking to the bathroom. Pt in agreement for OT session.  Therapy Documentation Precautions:  Precautions Precautions: Fall, Other (comment) Precaution Comments: L inattention, hx of Alzheimer's Restrictions Weight Bearing Restrictions: No Vital Signs: Please see "Flowsheet" for most recent vitals charted by nursing staff.  Pain: Pain Assessment Pain Scale: 0-10 Pain Score: 0-No pain  ADL: Grooming:  (Pt declines need at this time.) Toileting: Dependent (Pt unable to void, however, pt requires 2-person assist to perform clothing management to attempt toileting tasks. Pt requires maximal assistance x 1 to maintain standing balance fron front of BSC with maximal assist of another to manage clothing.) Where Assessed-Toileting: Bedside Commode (placed over the toilet) Toilet Transfer: Moderate assistance (Pt able to complete ambulatory transfer from w/c <> bathroom with moderate assistance x 1 & CGA of another  (primarily for guiding pt during ambulation). Pt requires moderate VCs for BLE advancement and continuation of ambulation.) Toilet Transfer Method: Counselling psychologist: Grab bars, Extra wide bedside commode (BSC placed over the toilet) ADL Comments: Pt demo's improved alertness today. Pt able to perform ambulatory transfer without AD from toilet <> w/c. OT initially attempted ambulatory transfer with FWW for improved stability, however, pt presented with increased difficulty with sequencing and motor planning ambulation with the use of the FWW. Using the walker adds 1-2 more steps for pt to complete with ambulation and this increased the level of assist and safety concerns. However, pt responded well to moderate assist x 1 person (OT) placed on the L side for support and CGA of another on the R side with HHA to guide pt to the bathroom. Pt requires fluctuations of moderate - maximal assistance for sit <> stand transfers from various surfaces (w/c and BSC placed over the toilet).  Pt requested to stay in the w/c at end of session. Pt left sitting comfortably in the w/c with personal belongings and call light within reach, belt alarm placed and activated, and comfort needs attended to.   --------------------------------------------------------------------------------------------------------------------------------------------------------------------------------------------------------------------------  Second Session (1106 - 1215) - Skilled Therapeutic Interventions/Progress Updates:  Pt asleep in the w/c upon OT arrival to the room. Pt reports, "Blurry," referring to therapist's face with & without glasses on. Pt in agreement for OT session.  Therapy Documentation Pain:  06/15/22 1106  Pain Assessment  Pain Scale 0-10  Pain Score 0   Therapeutic Activity - Standing Balance: Pt participates in therapeutic activity in the therapy gym in order  to improve independence with functional  transfers and static standing tolerance/endurance by participating in a standing activity at table-top to complete a coloring activity. Pt performs various sit <> stand transfers form w/c with fluctuations from moderate assistance to maximal assistance due to decrease strength in BLE and core needs to perform adequate force production for positional changes. Pt able to complete static standing at the table while completing a fine motor and visual activity to color with the following timed trials with minimal assistance to maintain standing balance: -Trial 1: 3 mins, 4 secs -Trial 2: 3 mins, 6 secs -Trial 3: <5 secs - pt achieves standing with maximal assistance and then reports "stop it" and proceeds to sit back in w/c.   Pt requires seated rest breaks between trials due to increased fatigue.   Therapeutic Activity - Attention to Task & Visual Perceptual Skills:  Pt participates in therapeutic activity in the therapy gym in order to improve attention to task and visual perceptual skills/scanning the to L side. Pt completed fine motor and visual perceptual activity of coloring while standing at the table (see above for details regarding standing balance). On 1st trial, pt demos motor perseveration by consistently writing the # 48 on the coloring sheet with pt being unable to redirect to coloring strokes. Pt requires hand-over-hand assistance to perform coloring strokes with only attention noted to the R side of the coloring page. On the 2nd trial, pt able to then initiate coloring with only minimal VC's and demo's improved continuation and attention to task. Pt able to color <25 % of the page in 3 minutes with pt only coloring on the R side of the page. Pt requires prompts to switch colors during task. Pt able to perform coloring strokes with R hand using a functional grasp on the crayon.   Family Education: [name] daughter in room once pt was returned back to room from the therapy gym via w/c. Education  provided to pt's daughter regarding pt's performance during OT sessions today. Educated daughter on how pt was able to perform ambulation from w/c <> the toilet without AD and the increased need for sequencing/problem solving skills with the [place]. Pt;s daughter inquired about family providing assistance with ambulation at home after DC. OT provided education that pt requires skilled intervention to safely perform ambulation and pt's assist level and vary greatly depending on pt's alertness as this could increased fall risks within the home. [name] daughter reports wish for pt's highest quality of life. OT provided education that due to pt's cognitive deficits, pt has difficulty with the insight needed to enjoy progression with ambulation. Pt demo's increased difficulty with ambulation and would verbalize things that suggest pt did not enjoy this task. OT educated daughter that pt's quality of life can depend on the task or thing that pt is doing (for example sitting in the living room to see out the window for quality of life instead of quality originating from the transfer technique that is used). Educated daughter on letting PT speak to recommendation on this, however, OT observed pt getting more enjoyment out of completing resting activities instead of participating in ambulatory transfers. [name] daughter verbalized understanding.   Pt requested to stay in the w/c at end of session. Pt left sitting comfortably in the w/c with personal belongings and call light within reach, daughter present in the room, alarm placed and activated, and comfort needs attended to.    Therapy/Group: Individual Therapy  Barbee Shropshire 06/15/2022, 12:28 PM

## 2022-06-15 NOTE — Progress Notes (Signed)
PROGRESS NOTE   Subjective/Complaints: No new issues. Appears comfortable  ROS: Limited due to cognitive/behavioral   Objective:   No results found. No results for input(s): "WBC", "HGB", "HCT", "PLT" in the last 72 hours.  No results for input(s): "NA", "K", "CL", "CO2", "GLUCOSE", "BUN", "CREATININE", "CALCIUM" in the last 72 hours.  Intake/Output Summary (Last 24 hours) at 06/15/2022 1046 Last data filed at 06/15/2022 0834 Gross per 24 hour  Intake 234 ml  Output --  Net 234 ml        Physical Exam: Vital Signs Blood pressure (!) 150/71, pulse 75, temperature 97.9 F (36.6 C), resp. rate 16, height '5\' 2"'$  (1.575 m), weight 68.3 kg, SpO2 97 %.   Constitutional: No distress . Vital signs reviewed. HEENT: NCAT, EOMI, oral membranes moist Neck: supple Cardiovascular: RRR without murmur. No JVD    Respiratory/Chest: CTA Bilaterally without wheezes or rales. Normal effort    GI/Abdomen: BS +, non-tender, non-distended Ext: no clubbing, cyanosis, or edema Psych: pleasant and cooperative  Skin: No evidence of breakdown, no evidence of rash  Neurologic:  awake, alert. Limited awareness. motor strength is 4/5 in bilateral deltoid, bicep, tricep, grip, hip flexor, knee extensors, ankle dorsiflexor and plantar flexor Left neglect evident.  Cannot assess sensation due to cog def Musculoskeletal: no pain with range of motion in Upper extremities. No joint swelling   Assessment/Plan: 1. Functional deficits which require 3+ hours per day of interdisciplinary therapy in a comprehensive inpatient rehab setting. Physiatrist is providing close team supervision and 24 hour management of active medical problems listed below. Physiatrist and rehab team continue to assess barriers to discharge/monitor patient progress toward functional and medical goals  Care Tool:  Bathing    Body parts bathed by patient: Right arm, Left arm,  Chest, Abdomen, Face   Body parts bathed by helper: Front perineal area, Buttocks, Right upper leg, Left upper leg, Right lower leg, Left lower leg     Bathing assist Assist Level: Moderate Assistance - Patient 50 - 74%     Upper Body Dressing/Undressing Upper body dressing   What is the patient wearing?: Button up shirt, Bra    Upper body assist Assist Level: Moderate Assistance - Patient 50 - 74%    Lower Body Dressing/Undressing Lower body dressing      What is the patient wearing?: Pants, Incontinence brief     Lower body assist Assist for lower body dressing: Total Assistance - Patient < 25%     Toileting Toileting Toileting Activity did not occur Landscape architect and hygiene only): Refused  Toileting assist Assist for toileting: 2 Helpers     Transfers Chair/bed transfer  Transfers assist  Chair/bed transfer activity did not occur: Safety/medical concerns  Chair/bed transfer assist level: Moderate Assistance - Patient 50 - 74%     Locomotion Ambulation   Ambulation assist      Assist level: 2 helpers Assistive device: Other (comment) (3 Musketeer) Max distance: 78f   Walk 10 feet activity   Assist     Assist level: 2 helpers Assistive device: Other (comment) (3 Musketeer)   Walk 50 feet activity   Assist Walk 50 feet with 2 turns  activity did not occur: Safety/medical concerns         Walk 150 feet activity   Assist Walk 150 feet activity did not occur: Safety/medical concerns         Walk 10 feet on uneven surface  activity   Assist Walk 10 feet on uneven surfaces activity did not occur: Safety/medical concerns         Wheelchair     Assist Is the patient using a wheelchair?: Yes Type of Wheelchair: Manual    Wheelchair assist level: Dependent - Patient 0%      Wheelchair 50 feet with 2 turns activity    Assist        Assist Level: Dependent - Patient 0%   Wheelchair 150 feet activity      Assist      Assist Level: Dependent - Patient 0%   Blood pressure (!) 150/71, pulse 75, temperature 97.9 F (36.6 C), resp. rate 16, height '5\' 2"'$  (1.575 m), weight 68.3 kg, SpO2 97 %. Medical Problem List and Plan: 1. Functional deficits secondary to right thalamic infarct PCA distribution ; probable Alzheimer's plus vascular dementia and prior stroke/TIA with left arm weakness which has improved  and left hemianopsia             -patient may shower             -ELOS/Goals: 8/23 min assist goals,   -Continue CIR therapies including PT, OT, and SLP - minimal functional gains emphasizing family training   -home with hospice on 8/23   2.  Antithrombotics: -DVT/anticoagulation:  Pharmaceutical: Lovenox  -keep an eye on platelets, trending down slightly             -antiplatelet therapy: Plavix and aspirin 81 mg (family declines Brilinta) 3. Pain Management: Tylenol as needed 4. Mood/Behavior/Sleep: LCSW to evaluate and provide emotional support             -antipsychotic agents: n/a 5. Neuropsych/cognition: This patient is not capable of making decisions on her own behalf. 6. Skin/Wound Care: Routine skin care checks 7. Fluids/Electrolytes/Nutrition: Routine I's and O's and follow-up chemistries             -heart healthy diet/thin liq; crush meds in pureed Poor fluid intake will recheck BMET in am   8/5- Last Cr 8/4 was 1.21- stable- BUN slightly more elevated at 29- will recheck Monday and if getting worse, might need IVFs  8/14 BUN still elevated at 29. Push fluids  -no IVF at this point 8: Hypertension: continue amlodipine 5 mg daily             -Cozaar 50 mg daily is being held  8/19 controlled  Vitals:   06/14/22 1936 06/15/22 0510  BP: (!) 140/84 (!) 150/71  Pulse: 82 75  Resp: 16 16  Temp: 98.3 F (36.8 C) 97.9 F (36.6 C)  SpO2: 96% 97%   9: Hyperlipidemia: continue Zetia, Pravachol, heart healthy diet 10: CAD: stable, no CP 11: Alzheimer's dementia; follows  with Dr. Felecia Shelling. Does not tolerate donepezil, severe cognitive dysfunction - GOC discussion per palliative appreciated 12: Diastolic CHF: stable, no SOB or edema, no signs of overload 13: Insomnia: continue melatonin>>need to give by 7-7:30 pm nightly- one daughter requests to not awaken pt for turning at night , the other daughter is ok with skin care protocol 14: Left nipple skin change: no sign of infection; follow-up as outpatient 15: Urinary incontinence: possibly chronic component due to dementia             -  check PVR's  -  timed voids as possible  16.  Jay meeting with palliative . Plan is for home hospice, DNR status not changed thus far 17.  Borderline hypercalcemia, will monitor not receiving supplements   LOS: 18 days A FACE TO FACE EVALUATION WAS PERFORMED  Meredith Staggers 06/15/2022, 10:46 AM

## 2022-06-16 NOTE — Progress Notes (Signed)
Physical Therapy Session Note  Patient Details  Name: Katelyn Powers MRN: [id number] Date of Birth: 10/12/1932  Today's Date: 06/16/2022 PT Individual Time: 1000-1045 PT Individual Time Calculation (min): 45 min   Short Term Goals: Week 3:  PT Short Term Goal 1 (Week 3): STG=LTG duet o ELOS  Skilled Therapeutic Interventions/Progress Updates: Pt presents supine in bed asleep.  Pt requires encouragement to participate in therapy.  Pt transfers sup to sit w/ mod A.  Pt requires verbal and visual cueing to scoot to EOB.  Pt sat EOB for max A donning pants , but min A for pull over shirt to complete.  Pt transfers sit to stand w/ mod A during session.  Pt amb x 25' x 2 trials w/ mod A to maintain RW, as pt pushes too far.  Pt attempts to sit before w/c pulled behind.  Pt performed standing horseshoe toss w/ emphasis on attention to left, minimal success.  Pt remained sitting in w/c w/ chair alarm on and all needs in reach.     Therapy Documentation Precautions:  Precautions Precautions: Fall, Other (comment) Precaution Comments: L inattention, hx of Alzheimer's Restrictions Weight Bearing Restrictions: No General:   Vital Signs:   Pain:does not appear to have pain. Pain Assessment Pain Scale: 0-10 Pain Score: 0-No pain     Therapy/Group: Individual Therapy  Ladoris Gene 06/16/2022, 12:34 PM

## 2022-06-16 NOTE — Progress Notes (Signed)
Occupational Therapy Session Note  Patient Details  Name: Katelyn Powers MRN: [id number] Date of Birth: 1932-10-25  Today's Date: 06/16/2022 OT Individual Time: 1300-1345 OT Individual Time Calculation (min): 45 min    Short Term Goals: Week 3:  OT Short Term Goal 2 (Week 3): [name] family will report feeling educated and comfortable with HEP and ADL and mobility strategies in prep for d/c OT Short Term Goal 3 (Week 3): Pt will scan to L side >25 % of time with mod cues during basic ADL or visual/cog/motor activity wih mod VC's OT Short Term Goal 4 (Week 3): Pt will perform standing with improved consistency for LB garment, peri/buttocks hygiene and brief changes with 75 % consistency and mod A or less OT Short Term Goal 5 (Week 3): Pt will complete oral care with mod cues, set up and CGA.  Skilled Therapeutic Interventions/Progress Updates:    Patient agreeable to participate in OT session. Reports 0/10 pain level.   Patient participated in skilled OT session focusing on standing and sitting balance, left side physical and visual attention. in order to improve functional performance during self care tasks.  Standing balance tasks completed using RW and Min A to maintain balance. Moderate assist required to transition from sit to stand. Physical assist provided for proper hand placement on w/c armrests in order to push versus pull on walker. While standing, pt cleaned floor length mirror including right and left visual fields. Pt was able to clean all areas although missed the left upper quadrant and required verbal cues to look to the left upper part of the mirror to clean. Pt was able to attend to missed area when provided verbal prompt. Pt able to tolerate standing for approximately 3 minutes prior to sitting to rest. During functional transfer from w/c to mat table using RW, pt was provided with VC for proper standing posture within RW, physical assist was provided for RW safety to maintain a  safe distance from pt. Visual cues provided on floor to show pt where to stand with feet while standing with walker. Due to decreased consistent carry over of multimodel cueing, RW was removed and pt was provided with HHA with greater demonstration of standing posture.  Pt completed functional reaching task while focusing on sitting balance and left side awareness using Squigz. Squigz placed in right and left visual fields while pt removing squigz based on colored called out. Pt successfully located 9 out of 13 colors selected.     Therapy Documentation Precautions:  Precautions Precautions: Fall, Other (comment) Precaution Comments: L inattention, hx of Alzheimer's Restrictions Weight Bearing Restrictions: No   Therapy/Group: Individual Therapy  Ailene Ravel, OTR/L,CBIS  Supplemental OT - [facility]  06/16/2022, 4:22 PM

## 2022-06-16 NOTE — Progress Notes (Signed)
[facility]) Hospital Liaison Note  [facility] will continue to follow trough discharge. Anticipated discharge on 8.23. please call with any questions or concerns. Thank you  Roselee Nova, [place] [facility] Liaison [phone]

## 2022-06-17 LAB — CBC
HCT: 39.9 % (ref 36.0–46.0)
Hemoglobin: 13.4 g/dL (ref 12.0–15.0)
MCH: 30 pg (ref 26.0–34.0)
MCHC: 33.6 g/dL (ref 30.0–36.0)
MCV: 89.3 fL (ref 80.0–100.0)
Platelets: 189 10*3/uL (ref 150–400)
RBC: 4.47 MIL/uL (ref 3.87–5.11)
RDW: 13.6 % (ref 11.5–15.5)
WBC: 9.1 10*3/uL (ref 4.0–10.5)
nRBC: 0 % (ref 0.0–0.2)

## 2022-06-17 NOTE — Progress Notes (Signed)
Physical Therapy Session Note  Patient Details  Name: Katelyn Powers MRN: [id number] Date of Birth: [birth year]  Today's Date: 06/17/2022 PT Individual Time: 1032-1059 PT Individual Time Calculation (min): 27 min   Short Term Goals: Week 3:  PT Short Term Goal 1 (Week 3): STG=LTG duet o ELOS  Skilled Therapeutic Interventions/Progress Updates:     Pt received seated in WC and agrees to therapy. No complaint of pain. WC transport to gym for time management. Pt performs sit to stand with modA and cues for anterior weight shifting and tactile facilitation of hip extension. Pt ambulates x50' with minA/modA, without AD, with PT providing L HHA and multimodal cues for upright posture and sequencing of steps. Pt requires increased assistance for safe transition back to WC. Following extended seated rest break, pt ambulates x70' with modA L HHA for majority of distance but requiring totalA for transition back to [facility] due to pt attempting to sit prior to being safely in front of WC.  WC transport back to room. Pt left seated in WC with alarm intact and all needs within reach.  Therapy Documentation Precautions:  Precautions Precautions: Fall, Other (comment) Precaution Comments: L inattention, hx of Alzheimer's Restrictions Weight Bearing Restrictions: No   Therapy/Group: Individual Therapy  Breck Coons, PT, DPT 06/17/2022, 5:10 PM

## 2022-06-17 NOTE — Progress Notes (Signed)
Physical Therapy Session Note  Patient Details  Name: Katelyn Powers MRN: [id number] Date of Birth: 21-May-1932  Today's Date: 06/17/2022 PT Individual Time: 0802-0859 PT Individual Time Calculation (min): 57 min   Short Term Goals: Week 1:  PT Short Term Goal 1 (Week 1): Pt will perform supine<>sit with mod assist using bed features PT Short Term Goal 1 - Progress (Week 1): Met PT Short Term Goal 2 (Week 1): Pt will perform sit<>stand transfers using LRAD with max assist of 1 PT Short Term Goal 2 - Progress (Week 1): Met PT Short Term Goal 3 (Week 1): Pt will perform bed<>chair transfers using LRAD with mod assist without lift equipment PT Short Term Goal 3 - Progress (Week 1): Met PT Short Term Goal 4 (Week 1): Pt will ambulate at least 13f using LRAD with +2 mod assist PT Short Term Goal 4 - Progress (Week 1): Met Week 2:  PT Short Term Goal 1 (Week 2): Pt will consistently perform sit<>stand with mod assist PT Short Term Goal 1 - Progress (Week 2): Met PT Short Term Goal 2 (Week 2): Pt will ambulate 588fwith mod assist PT Short Term Goal 2 - Progress (Week 2): Met PT Short Term Goal 3 (Week 2): Pt will perform bed mobility with mod assist consistently PT Short Term Goal 3 - Progress (Week 2): Met PT Short Term Goal 4 (Week 2): pt will trasnfer to and from bed with Mod assist and no +2 with RW PT Short Term Goal 4 - Progress (Week 2): Met Week 3:  PT Short Term Goal 1 (Week 3): STG=LTG duet o ELOS  Skilled Therapeutic Interventions/Progress Updates:      Therapy Documentation Precautions:  Precautions Precautions: Fall, Other (comment) Precaution Comments: L inattention, hx of Alzheimer's Restrictions Weight Bearing Restrictions: No  Pt received semi-reclined in bed and without physical signs of pain throughout session. Pt requires max encouragement to participate and engage in PT session. Pt mod x 1 with bed mobility and mod A x 2 with sit to stand. Pt incontinent and  performed sit to stand with STEDY and pt was dependent for peri-care. STEDY transfer to chair and pt transported to dayroom in w/c for time management and energy conservation. Pt requires mod A with stand pivot with RW with max verbal and tactile cueing for sequencing. Pt requires bilateral HHA with gait of 21 feet with verbal cues for sequencing and to increase step length. Pt with decreased engagement during gait and required frequent verbal cueing to keep her eyes open. Pt declined further participation in PT session and transported to hospital room and left seated in chair at bedside with chair alarm on and all needs within reach.   Therapy/Group: Individual Therapy  SiVerl DickeriVerl DickerT, DPT  06/17/2022, 7:35 AM

## 2022-06-17 NOTE — Progress Notes (Signed)
Occupational Therapy Session Note  Patient Details  Name: Katelyn Powers MRN: [id number] Date of Birth: Sep 17, 1932  Today's Date: 06/17/2022 OT Individual Time: 0930-1015 OT Individual Time Calculation (min): 45 min    Short Term Goals: Week 3:  OT Short Term Goal 2 (Week 3): [name] family will report feeling educated and comfortable with HEP and ADL and mobility strategies in prep for d/c OT Short Term Goal 3 (Week 3): Pt will scan to L side >25 % of time with mod cues during basic ADL or visual/cog/motor activity wih mod VC's OT Short Term Goal 4 (Week 3): Pt will perform standing with improved consistency for LB garment, peri/buttocks hygiene and brief changes with 75 % consistency and mod A or less OT Short Term Goal 5 (Week 3): Pt will complete oral care with mod cues, set up and CGA.  Skilled Therapeutic Interventions/Progress Updates:    Upon OT arrival, pt seated in w/c and reports no pain. Pt agreeable to OT treatment session. Treatment intervention with a focus on dynamic sitting balance, attention to the L side of body, and self care retraining. Pt was provided her pills in applesauce by RN. Pt was then transported to main therapy gym via w/c and total A for time management. While seated in w/c retrieves bean bags from surface near feet using her B UE and instructed to transfer to therapist who was located to the L side of pt. Pt requires max verbal and tactile cues to initiate and sequence task. Pt completes with Min difficulty. Pt then instructed to retrieve bean bags from therapist and place onto surface near feet and pt with mod difficulty requiring verbal and tactile cues to complete. Pt retrieves a few bean bags from the R side of body from therapist to place onto the surface infront of pt with Min difficulty. Pt requires rest breaks as needed during task. Pt was returned to her room via w/c and total A and doff/donns shoes with Supervision and increased time while seated in w/c. Pt was  left in w/c at end of session with all needs met and safety measures in place.   Therapy Documentation Precautions:  Precautions Precautions: Fall, Other (comment) Precaution Comments: L inattention, hx of Alzheimer's Restrictions Weight Bearing Restrictions: No   Therapy/Group: Individual Therapy  Marvetta Gibbons 06/17/2022, 12:20 PM

## 2022-06-17 NOTE — Progress Notes (Signed)
7O19  [facility]) Hospital liaison note:  Visited [name] in her room this morning. Patient was sitting up in her wheelchair, resting.   Spoke with Daughter via telephone to confirm that DME delivery was still set for tomorrow, Tuesday 8.22.    Plan is for patient to discharge on Wednesday 8.23 transport has been arranged  for 11am.    Will continue to follow  Please call with any questions  Thanks,   Zigmund Gottron RN [facility] Liaison [phone]

## 2022-06-17 NOTE — Progress Notes (Signed)
Patient ID: Katelyn Powers, female   DOB: 1932/03/10, 88 y.o.   MRN: [id number]  This SW covering for primary [place].   SW confirms pt will d/c to home with hospice. SW scheduled ambulance transport with [facility] for 8/23 with pick up at 11am.   *SW met with pt dtr [name] in room to discuss d/c on Wednesday and transportation arranged. Medical team informed on above.    [name], MSW, [place] Office: [phone] Cell: [phone] Fax: [phone]

## 2022-06-17 NOTE — Progress Notes (Signed)
PROGRESS NOTE   Subjective/Complaints:  Pt thought I was "Mr. Tamala Julian", thinks she has seen me before  ROS: Limited due to cognitive/behavioral   Objective:   No results found. Recent Labs    06/17/22 0616  WBC 9.1  HGB 13.4  HCT 39.9  PLT 189    No results for input(s): "NA", "K", "CL", "CO2", "GLUCOSE", "BUN", "CREATININE", "CALCIUM" in the last 72 hours.  Intake/Output Summary (Last 24 hours) at 06/17/2022 0910 Last data filed at 06/16/2022 1700 Gross per 24 hour  Intake 240 ml  Output --  Net 240 ml         Physical Exam: Vital Signs Blood pressure 126/72, pulse 66, temperature 97.8 F (36.6 C), resp. rate 17, height '5\' 2"'$  (1.575 m), weight 68.3 kg, SpO2 96 %.    General: No acute distress Mood and affect are appropriate Heart: Regular rate and rhythm no rubs murmurs or extra sounds Lungs: Clear to auscultation, breathing unlabored, no rales or wheezes Abdomen: Positive bowel sounds, soft nontender to palpation, nondistended Extremities: No clubbing, cyanosis, or edema  Skin: No evidence of breakdown, no evidence of rash  Neurologic:  awake, alert. Limited awareness. motor strength is 4/5 in bilateral deltoid, bicep, tricep, grip, hip flexor, knee extensors, ankle dorsiflexor and plantar flexor Left neglect evident.  Cannot assess sensation due to cog def Musculoskeletal: no pain with range of motion in Upper extremities. No joint swelling   Assessment/Plan: 1. Functional deficits which require 3+ hours per day of interdisciplinary therapy in a comprehensive inpatient rehab setting. Physiatrist is providing close team supervision and 24 hour management of active medical problems listed below. Physiatrist and rehab team continue to assess barriers to discharge/monitor patient progress toward functional and medical goals  Care Tool:  Bathing    Body parts bathed by patient: Right arm, Left arm,  Chest, Abdomen, Face   Body parts bathed by helper: Front perineal area, Buttocks, Right upper leg, Left upper leg, Right lower leg, Left lower leg     Bathing assist Assist Level: Moderate Assistance - Patient 50 - 74%     Upper Body Dressing/Undressing Upper body dressing   What is the patient wearing?: Button up shirt, Bra    Upper body assist Assist Level: Moderate Assistance - Patient 50 - 74%    Lower Body Dressing/Undressing Lower body dressing      What is the patient wearing?: Pants, Incontinence brief     Lower body assist Assist for lower body dressing: Total Assistance - Patient < 25%     Toileting Toileting Toileting Activity did not occur Landscape architect and hygiene only): Refused  Toileting assist Assist for toileting: 2 Helpers     Transfers Chair/bed transfer  Transfers assist  Chair/bed transfer activity did not occur: Safety/medical concerns  Chair/bed transfer assist level: Moderate Assistance - Patient 50 - 74%     Locomotion Ambulation   Ambulation assist      Assist level: Moderate Assistance - Patient 50 - 74% Assistive device: Walker-rolling Max distance: 25   Walk 10 feet activity   Assist     Assist level: Moderate Assistance - Patient - 50 - 74% Assistive device:  Walker-rolling   Walk 50 feet activity   Assist Walk 50 feet with 2 turns activity did not occur: Safety/medical concerns         Walk 150 feet activity   Assist Walk 150 feet activity did not occur: Safety/medical concerns         Walk 10 feet on uneven surface  activity   Assist Walk 10 feet on uneven surfaces activity did not occur: Safety/medical concerns         Wheelchair     Assist Is the patient using a wheelchair?: Yes Type of Wheelchair: Manual    Wheelchair assist level: Dependent - Patient 0%      Wheelchair 50 feet with 2 turns activity    Assist        Assist Level: Dependent - Patient 0%    Wheelchair 150 feet activity     Assist      Assist Level: Dependent - Patient 0%   Blood pressure 126/72, pulse 66, temperature 97.8 F (36.6 C), resp. rate 17, height '5\' 2"'$  (1.575 m), weight 68.3 kg, SpO2 96 %. Medical Problem List and Plan: 1. Functional deficits secondary to right thalamic infarct PCA distribution ; probable Alzheimer's plus vascular dementia and prior stroke/TIA with left arm weakness which has improved  and left hemianopsia             -patient may shower             -ELOS/Goals: 8/23 min assist goals,   -Continue CIR therapies including PT, OT, and SLP - minimal functional gains emphasizing family training   -home with hospice on 8/23   2.  Antithrombotics: -DVT/anticoagulation:  Pharmaceutical: Lovenox  -keep an eye on platelets, trending down slightly             -antiplatelet therapy: Plavix and aspirin 81 mg (family declines Brilinta) 3. Pain Management: Tylenol as needed 4. Mood/Behavior/Sleep: LCSW to evaluate and provide emotional support             -antipsychotic agents: n/a 5. Neuropsych/cognition: This patient is not capable of making decisions on her own behalf. 6. Skin/Wound Care: Routine skin care checks 7. Fluids/Electrolytes/Nutrition: Routine I's and O's and follow-up chemistries             -heart healthy diet/thin liq; crush meds in pureed Poor fluid intake will recheck BMET in am   8/5- Last Cr 8/4 was 1.21- stable- BUN slightly more elevated at 29- will recheck Monday and if getting worse, might need IVFs  8/14 BUN still elevated at 29. Push fluids  -no IVF at this point 8: Hypertension: continue amlodipine 5 mg daily             -Cozaar 50 mg daily is being held  8/21 controlled  Vitals:   06/16/22 1946 06/17/22 0454  BP: 108/68 126/72  Pulse: 74 66  Resp: 17 17  Temp: 98.5 F (36.9 C) 97.8 F (36.6 C)  SpO2: 95% 96%  Controlled 8/21 9: Hyperlipidemia: continue Zetia, Pravachol, heart healthy diet 10: CAD: stable, no  CP 11: Alzheimer's dementia; follows with Dr. Felecia Shelling. Does not tolerate donepezil, severe cognitive dysfunction - GOC discussion per palliative appreciated 12: Diastolic CHF: stable, no SOB or edema, no signs of overload 13: Insomnia: continue melatonin>>need to give by 7-7:30 pm nightly- one daughter requests to not awaken pt for turning at night , the other daughter is ok with skin care protocol 14: Left nipple skin change: no sign of  infection; follow-up as outpatient 15: Urinary incontinence: possibly chronic component due to dementia             -check PVR's  -  timed voids as possible  16.  [place] meeting with palliative . Plan is for home hospice, DNR status not changed thus far 17.  Borderline hypercalcemia, will monitor not receiving supplements   LOS: 20 days A FACE TO FACE EVALUATION WAS PERFORMED  Charlett Blake 06/17/2022, 9:10 AM

## 2022-06-18 NOTE — Progress Notes (Addendum)
Patient ID: Katelyn Powers, female   DOB: 05/26/1932, 88 y.o.   MRN: [id number]  This SW covering for primary SW, Erlene Quan.   SW received updates from therapy team on 06/17/22 that [name] was going to transport pt home tomorrow. SW was waiting on confirmation from therpay team on which is safest mode of transportation. Today, therapy team recommends ambulance transportation home.   SW left message for [name] to inform on above, and will keep 11am p/u with Katelyn Powers ambulance in place. SW requested follow-up to confirm she has received the message.   *SW returned phone call to [name] to discuss above about ambulance transport. She reports DME just arrived now and they are setting up. She indicates she will be here tomorrow morning at 9:30am to pick up the remained of her belongings. SW informed medical team.   [name], MSW, [place] Office: [phone] Cell: [phone] Fax: [phone]

## 2022-06-18 NOTE — Progress Notes (Signed)
Physical Therapy Session Note  Patient Details  Name: Katelyn Powers MRN: [id number] Date of Birth: 1932/08/27  Today's Date: 06/18/2022 PT Individual Time: 0800-0912 PT Individual Time Calculation (min): 72 min   Short Term Goals: Week 3:  PT Short Term Goal 1 (Week 3): STG=LTG duet o ELOS  Skilled Therapeutic Interventions/Progress Updates:      Pt seen in bed with NT supervising/assisting with feeding her breakfast. Pt appears agreeable to therapy and unable to answer pain questions due to cognition. Assisted with donning pants at bed level with +2 assist. Rolling in bed towards her R with maxA and requires totalA for rolling L, using hospital bed features. Supine<>sitting EOB requiring totalA (pt <25%) for BLE and trunk support. Once sitting EOB, requires totalA as well for forward scooting to feet flat. Able to sit unsupported with close SBA.   Squat<>pivot transfer completed with maxA from EOB to w/c, towards her L side. Assist for forward weight shifting and boosting hips during transfer. Wheeled to sink to initiate self care tasks - requires setupA for oral care and hand-over-hand assist for initiating brushing her teeth - pt able to swish and spit water when provided. She attempted to apply lipstick but she was unable to accurately place it and would also attempt to bite it.   RN arriving for morning rx and then patient was transported to main rehab gym for time management.   Instructed in gait training using EVA walker with +2 assist (rehab tech steering/managing walker with PT facilitating gait posteriorly). Pt ambulated 109f + 919f+ 9440fseated rest breaks) with modA +2 overall. Assist for body weight support, lateral weight shifting. Max cues for upright posture and keeping body within walker - no carryover or initiation for verbal cues. Pt also keeping eyes shut while walking but able to open when instructed, unable to maintain.   Returned to room and she remained seated in w/c  with safety belt alarm on, call bell in reach, all needs met.   Therapy Documentation Precautions:  Precautions Precautions: Fall, Other (comment) Precaution Comments: L inattention, hx of Alzheimer's Restrictions Weight Bearing Restrictions: No General:     Therapy/Group: Individual Therapy  ChrAlger Simons22/2023, 7:50 AM

## 2022-06-18 NOTE — Progress Notes (Signed)
Inpatient Rehabilitation Discharge Medication Review by a Pharmacist  A complete drug regimen review was completed for this patient to identify any potential clinically significant medication issues.  High Risk Drug Classes Is patient taking? Indication by Medication  Antipsychotic No   Anticoagulant No   Antibiotic No   Opioid No   Antiplatelet Yes Aspirin, plavix- CVA ppx  Hypoglycemics/insulin No   Vasoactive Medication Yes Norvasc- HTN  Chemotherapy No   Other Yes Zetia, pravachol- HLD     Type of Medication Issue Identified Description of Issue Recommendation(s)  Drug Interaction(s) (clinically significant)     Duplicate Therapy     Allergy     No Medication Administration End Date     Incorrect Dose     Additional Drug Therapy Needed     Significant med changes from prior encounter (inform family/care partners about these prior to discharge).    Other  Losartain, Omega-3, Vitamin D, nitroglycerin 0.'4mg'$  sublingual,  Restart PTA meds if necessary at time of discharge, if warranted.    Clinically significant medication issues were identified that warrant physician communication and completion of prescribed/recommended actions by midnight of the next day:  No   Time spent performing this drug regimen review (minutes):  30   Quynh Basso BS, PharmD, BCPS Clinical Pharmacist 06/19/2022 7:20 AM  Contact: [phone] after 3 PM  "Be curious, not judgmental..." -Jamal Maes

## 2022-06-18 NOTE — Progress Notes (Signed)
PROGRESS NOTE   Subjective/Complaints:  Labs reviewed , pt seen in therapy   ROS: Limited due to cognitive/behavioral   Objective:   No results found. Recent Labs    06/17/22 0616  WBC 9.1  HGB 13.4  HCT 39.9  PLT 189     No results for input(s): "NA", "K", "CL", "CO2", "GLUCOSE", "BUN", "CREATININE", "CALCIUM" in the last 72 hours.  Intake/Output Summary (Last 24 hours) at 06/18/2022 0836 Last data filed at 06/17/2022 1325 Gross per 24 hour  Intake 120 ml  Output --  Net 120 ml         Physical Exam: Vital Signs Blood pressure (!) 150/84, pulse 71, temperature (!) 97.5 F (36.4 C), temperature source Oral, resp. rate 18, height '5\' 2"'$  (1.575 m), weight 68.3 kg, SpO2 98 %.    General: No acute distress Mood and affect are appropriate  Sit to stand with mod A, amb with patform walker , min/modA, has forward lean.  Follows simple commands to step higher , no knee instability or foot drop  Left neglect evident.  Cannot assess sensation due to cog def Musculoskeletal: no pain with range of motion in Upper extremities. No pain with ambulation    Assessment/Plan: 1. Functional deficits which require 3+ hours per day of interdisciplinary therapy in a comprehensive inpatient rehab setting. Physiatrist is providing close team supervision and 24 hour management of active medical problems listed below. Physiatrist and rehab team continue to assess barriers to discharge/monitor patient progress toward functional and medical goals  Care Tool:  Bathing    Body parts bathed by patient: Right arm, Left arm, Chest, Abdomen, Face   Body parts bathed by helper: Front perineal area, Buttocks, Right upper leg, Left upper leg, Right lower leg, Left lower leg     Bathing assist Assist Level: Moderate Assistance - Patient 50 - 74%     Upper Body Dressing/Undressing Upper body dressing   What is the patient wearing?:  Button up shirt, Bra    Upper body assist Assist Level: Moderate Assistance - Patient 50 - 74%    Lower Body Dressing/Undressing Lower body dressing      What is the patient wearing?: Pants, Incontinence brief     Lower body assist Assist for lower body dressing: Total Assistance - Patient < 25%     Toileting Toileting Toileting Activity did not occur Landscape architect and hygiene only): Refused  Toileting assist Assist for toileting: Moderate Assistance - Patient 50 - 74%     Transfers Chair/bed transfer  Transfers assist  Chair/bed transfer activity did not occur: Safety/medical concerns  Chair/bed transfer assist level: Moderate Assistance - Patient 50 - 74%     Locomotion Ambulation   Ambulation assist      Assist level: 2 helpers Assistive device: Hand held assist Max distance: 25   Walk 10 feet activity   Assist     Assist level: Moderate Assistance - Patient - 50 - 74% Assistive device: Walker-rolling   Walk 50 feet activity   Assist Walk 50 feet with 2 turns activity did not occur: Safety/medical concerns         Walk 150 feet activity  Assist Walk 150 feet activity did not occur: Safety/medical concerns         Walk 10 feet on uneven surface  activity   Assist Walk 10 feet on uneven surfaces activity did not occur: Safety/medical concerns         Wheelchair     Assist Is the patient using a wheelchair?: Yes Type of Wheelchair: Manual    Wheelchair assist level: Dependent - Patient 0%      Wheelchair 50 feet with 2 turns activity    Assist        Assist Level: Dependent - Patient 0%   Wheelchair 150 feet activity     Assist      Assist Level: Dependent - Patient 0%   Blood pressure (!) 150/84, pulse 71, temperature (!) 97.5 F (36.4 C), temperature source Oral, resp. rate 18, height '5\' 2"'$  (1.575 m), weight 68.3 kg, SpO2 98 %. Medical Problem List and Plan: 1. Functional deficits secondary  to right thalamic infarct PCA distribution ; probable Alzheimer's plus vascular dementia and prior stroke/TIA with left arm weakness which has improved  and left hemianopsia             -patient may shower             -ELOS/Goals: 8/23 min assist goals,   -Continue CIR therapies including PT, OT, and SLP - minimal functional gains emphasizing family training   -home with hospice on 8/23- will be placed on DNR status at that time    2.  Antithrombotics: -DVT/anticoagulation:  Pharmaceutical: Lovenox  -keep an eye on platelets, trending down slightly             -antiplatelet therapy: Plavix and aspirin 81 mg (family declines Brilinta) 3. Pain Management: Tylenol as needed 4. Mood/Behavior/Sleep: LCSW to evaluate and provide emotional support             -antipsychotic agents: n/a 5. Neuropsych/cognition: This patient is not capable of making decisions on her own behalf. 6. Skin/Wound Care: Routine skin care checks 7. Fluids/Electrolytes/Nutrition: Routine I's and O's and follow-up chemistries             -intake has been fair 50-75% of meals   8: Hypertension: continue amlodipine 5 mg daily             -Cozaar 50 mg daily is being held  3/55 mild systolic elevation last 2 readings but mainly normotensive readings  Vitals:   06/18/22 0416 06/18/22 0825  BP: (!) 153/86 (!) 150/84  Pulse: 71   Resp: 18   Temp: (!) 97.5 F (36.4 C)   SpO2: 98%    9: Hyperlipidemia: continue Zetia, Pravachol, heart healthy diet 10: CAD: stable, no CP 11: Alzheimer's dementia; follows with Dr. Felecia Shelling. Does not tolerate donepezil, severe cognitive dysfunction - GOC discussion per palliative appreciated 12: Diastolic CHF: stable, no SOB or edema, no signs of overload 13: Insomnia: continue melatonin>>need to give by 7-7:30 pm nightly- one daughter requests to not awaken pt for turning at night , the other daughter is ok with skin care protocol 14: Left nipple skin change: no sign of infection; follow-up as  outpatient 15: Urinary incontinence: possibly chronic component due to dementia             -check PVR's  -  timed voids as possible  16.  [place] meeting with palliative . Plan is for home hospice, DNR status not changed thus far    LOS: 21 days A  FACE TO FACE EVALUATION WAS PERFORMED  Charlett Blake 06/18/2022, 8:36 AM

## 2022-06-18 NOTE — Progress Notes (Signed)
Occupational Therapy Session Note  Patient Details  Name: Katelyn Powers MRN: [id number] Date of Birth: [birth year]  Today's Date: 06/18/2022 OT Individual Time: 2878-6767 OT Individual Time Calculation (min): 75 min    Short Term Goals: Week 3:  OT Short Term Goal 2 (Week 3): [name] family will report feeling educated and comfortable with HEP and ADL and mobility strategies in prep for d/c OT Short Term Goal 3 (Week 3): Pt will scan to L side >25 % of time with mod cues during basic ADL or visual/cog/motor activity wih mod VC's OT Short Term Goal 4 (Week 3): Pt will perform standing with improved consistency for LB garment, peri/buttocks hygiene and brief changes with 75 % consistency and mod A or less OT Short Term Goal 5 (Week 3): Pt will complete oral care with mod cues, set up and CGA.  Skilled Therapeutic Interventions/Progress Updates:  Pt awake seated in w/c with daughter upon OT arrival to the room. Pt reports, "I don't have any," when therapist asks about a shower. Pt in agreement for OT session.  Therapy Documentation Precautions:  Precautions Precautions: Fall, Other (comment) Precaution Comments: L inattention, hx of Alzheimer's Restrictions Weight Bearing Restrictions: No Vital Signs: Please see "Flowsheet" for most recent vitals charted by nursing staff.  Pain: Pain Assessment Pain Scale: 0-10 Pain Score: 0-No pain  ADL: Upper Body Bathing: Maximal assistance (Pt requires maximal assistance to bathe UB parts while in shower seated on [facility] for shower chair. Pt able to wash face with assistance for thoroughness.) Where Assessed-Upper Body Bathing: Shower Lower Body Bathing: Maximal assistance (Pt requires maximal assistance to bathe LB parts while seated n BSC to improve safety in the shower.) Where Assessed-Lower Body Bathing: Shower Upper Body Dressing:  (Due to time constraint, pt requires maximal assistance to doff a pull-over style shirt and a button-up style  jacket.) Where Assessed-Upper Body Dressing: Edge of bed Lower Body Dressing:  (Pt requires maximal assistance to doff pants, brief, and socks/shoes due to time constraint while showering.) Where Assessed-Lower Body Dressing: Edge of bed Toileting: Dependent (Pt voids bladder while seated on BSC in the shower, however, pt unaware of voiding of bladder and requires total assistance to perform hygiene.) Where Assessed-Toileting: Bedside Commode (in shower) Gaffer Transfer: Moderate assistance (Pt able to ambulate from w/c > BSC placed in the shower with moderate assistance x 2 with no AD. Pt demo's increased difficulty with motor planning using the FWW. Pt then requires maximal assistance to perform SPT to the L from [facility] in the shower > w/c.) Social research officer, government Method: Ambulating Walk-In Shower Equipment: Grab bars (BSC in shower) ADL Comments: Pt demo's improved alertness today. However, pt requires increased assistance to complete bathing and dressing tasks due to increased fatigue and difficulty with command following. Pt demo's intermittent (low) agitation with shower due to increased water pressure which OT is unable to change. Pt able to be redirected with comfort of washing hair, however, pt unable to participate in bathing due to fatigue and low frustration tolerance for water pressure. Once transferred back to w/c after shower, pt able to perform SPT to the R with moderate assistance and blocking of LLE. Pt requires significantly increased time to complete ADL's due to difficulty with command following and increased agitation with the water from the shower.   DC Education: Education provided to pt's daughter on importance of maintaining quality of life after DC. Encouraged daughter to not view performing tasks as rehabilitative and more to ensure  quality of life. Education provided to daughter on techniques to perform oral care at bed level if needed. Daughter verbalizes  understanding.   Pt returned to bed at end of session. Pt left resting comfortably in bed with personal belongings and call light within reach, bed alarm on and activated, bed in low position, 3 bed rails up, daughter present in room, and comfort needs attended to.   Therapy/Group: Individual Therapy  Barbee Shropshire 06/18/2022, 4:27 PM

## 2022-06-18 NOTE — Plan of Care (Signed)
Incontinent of bladder despite toileting

## 2022-06-18 NOTE — Progress Notes (Signed)
[place] [facility]) Hospital liaison note  Spoke with patient's daughter via telephone. DME is scheduled to arrive at the patient's home between 2pm and 5pm today. Patient is discharging tomorrow, Wednesday 8.23. Transport has been arranged for 11am. Will follow up tomorrow.   Please call with any questions.   Thanks,   Zigmund Gottron RN  [facility] Liaison  [phone]

## 2022-06-19 MED ORDER — ACETAMINOPHEN 325 MG PO TABS: 325.0000 mg | ORAL_TABLET | ORAL | Status: AC | PRN

## 2022-06-19 MED ORDER — METHOCARBAMOL 500 MG PO TABS: 500.0000 mg | ORAL_TABLET | Freq: Four times a day (QID) | ORAL | Status: AC | PRN

## 2022-06-19 NOTE — Progress Notes (Addendum)
PROGRESS NOTE   Subjective/Complaints:  No issues overnite, pt oriented to self (baseline) pleasant and smiling   ROS: Limited due to cognitive/behavioral   Objective:   No results found. Recent Labs    06/17/22 0616  WBC 9.1  HGB 13.4  HCT 39.9  PLT 189     No results for input(s): "NA", "K", "CL", "CO2", "GLUCOSE", "BUN", "CREATININE", "CALCIUM" in the last 72 hours.  Intake/Output Summary (Last 24 hours) at 06/19/2022 0924 Last data filed at 06/19/2022 0810 Gross per 24 hour  Intake 190 ml  Output --  Net 190 ml         Physical Exam: Vital Signs Blood pressure (!) 145/76, pulse 71, temperature 98.2 F (36.8 C), temperature source Oral, resp. rate 18, height '5\' 2"'$  (1.575 m), weight 68.3 kg, SpO2 96 %.    General: No acute distress Mood and affect are appropriate Heart: Regular rate and rhythm no rubs murmurs or extra sounds Lungs: Clear to auscultation, breathing unlabored, no rales or wheezes Abdomen: Positive bowel sounds, soft nontender to palpation, nondistended Extremities: No clubbing, cyanosis, or edema  Neurologic: Cranial nerves II through XII intact, motor strength is 4/5 in bilateral deltoid, bicep, tricep, grip, hip flexor, knee extensors, ankle dorsiflexor and plantar flexor Sensory exam limited by attention to task    Assessment/Plan: 1. Functional deficits due to RIght thalamic infarct Stable for D/C today F/u Hospice MD No F/u PM&R  needed See D/C summary See D/C instructions   Care Tool:  Bathing    Body parts bathed by patient: Right arm, Left arm, Chest, Abdomen, Face   Body parts bathed by helper: Front perineal area, Buttocks, Right upper leg, Left upper leg, Right lower leg, Left lower leg     Bathing assist Assist Level: Moderate Assistance - Patient 50 - 74%     Upper Body Dressing/Undressing Upper body dressing   What is the patient wearing?: Button up shirt,  Bra    Upper body assist Assist Level: Moderate Assistance - Patient 50 - 74%    Lower Body Dressing/Undressing Lower body dressing      What is the patient wearing?: Pants, Incontinence brief     Lower body assist Assist for lower body dressing: Total Assistance - Patient < 25%     Toileting Toileting Toileting Activity did not occur Landscape architect and hygiene only): Refused  Toileting assist Assist for toileting: Moderate Assistance - Patient 50 - 74%     Transfers Chair/bed transfer  Transfers assist  Chair/bed transfer activity did not occur: Safety/medical concerns  Chair/bed transfer assist level: Moderate Assistance - Patient 50 - 74%     Locomotion Ambulation   Ambulation assist      Assist level: Moderate Assistance - Patient 50 - 74% Assistive device: Walker-rolling Max distance: 75   Walk 10 feet activity   Assist     Assist level: Moderate Assistance - Patient - 50 - 74% Assistive device: Walker-Eva   Walk 50 feet activity   Assist Walk 50 feet with 2 turns activity did not occur: Safety/medical concerns  Assist level: Moderate Assistance - Patient - 50 - 74% Assistive device: Ethelene Hal  Walk 150 feet activity   Assist Walk 150 feet activity did not occur: Safety/medical concerns         Walk 10 feet on uneven surface  activity   Assist Walk 10 feet on uneven surfaces activity did not occur: Safety/medical concerns         Wheelchair     Assist Is the patient using a wheelchair?: Yes Type of Wheelchair: Manual    Wheelchair assist level: Maximal Assistance - Patient 25 - 49% Max wheelchair distance: 50    Wheelchair 50 feet with 2 turns activity    Assist        Assist Level: Maximal Assistance - Patient 25 - 49%   Wheelchair 150 feet activity     Assist      Assist Level: Total Assistance - Patient < 25%   Blood pressure (!) 145/76, pulse 71, temperature 98.2 F (36.8 C),  temperature source Oral, resp. rate 18, height '5\' 2"'$  (1.575 m), weight 68.3 kg, SpO2 96 %. Medical Problem List and Plan: 1. Functional deficits secondary to right thalamic infarct PCA distribution ; probable Alzheimer's plus vascular dementia and prior stroke/TIA with left arm weakness which has improved  and left hemianopsia               -home with hospice on 8/23- will be placed on DNR status at that time    2.  Antithrombotics: -DVT/anticoagulation:  Pharmaceutical: Lovenox  -keep an eye on platelets, trending down slightly             -antiplatelet therapy: Plavix and aspirin 81 mg (family declines Brilinta) 3. Pain Management: Tylenol as needed 4. Mood/Behavior/Sleep: LCSW to evaluate and provide emotional support             -antipsychotic agents: n/a 5. Neuropsych/cognition: This patient is not capable of making decisions on her own behalf. 6. Skin/Wound Care: Routine skin care checks 7. Fluids/Electrolytes/Nutrition: Routine I's and O's and follow-up chemistries             -intake has been fair 50-75% of meals   8: Hypertension: continue amlodipine 5 mg daily             -Cozaar 50 mg daily is being held  4/85 mild systolic elevation last 2 readings but mainly normotensive readings  Vitals:   06/18/22 1949 06/19/22 0344  BP: 131/80 (!) 145/76  Pulse: 79 71  Resp: 18 18  Temp: 97.7 F (36.5 C) 98.2 F (36.8 C)  SpO2: 94% 96%   9: Hyperlipidemia: continue Zetia, Pravachol, heart healthy diet 10: CAD: stable, no CP 11: Alzheimer's dementia; follows with Dr. Felecia Shelling. Does not tolerate donepezil, severe cognitive dysfunction - GOC discussion per palliative appreciated 12: Diastolic CHF: stable, no SOB or edema, no signs of overload 13: Insomnia: continue melatonin>>need to give by 7-7:30 pm nightly- one daughter requests to not awaken pt for turning at night , the other daughter is ok with skin care protocol 14: Left nipple skin change: no sign of infection; follow-up as  outpatient 15: Urinary incontinence: possibly chronic component due to dementia             -check PVR's  -  timed voids as possible  16.  [place] meeting with palliative . Plan is for home hospice, DNR status not changed thus far    LOS: 22 days A FACE TO [place] E Concha Sudol 06/19/2022, 9:24 AM

## 2022-06-19 NOTE — Progress Notes (Signed)
Physical Therapy Discharge Summary  Patient Details  Name: Katelyn Powers MRN: [id number] Date of Birth: 08-24-1932  Date of Discharge from PT service:June 18, 2022  {CHL IP REHAB PT TIME CALCULATION:304800500}   Patient has met {NUMBERS 0-12:18577} of {NUMBERS 0-12:18577} long term goals due to {due UL:8453646}.  Patient to discharge at [facility] level {LOA:3049010}.   Patient's care partner {care partner:3041650} to provide the necessary {assistance:3041652} assistance at discharge.  Reasons goals not met: ***  Recommendation:  Patient will benefit from ongoing skilled PT services in {setting:3041680} to continue to advance safe functional mobility, address ongoing impairments in ***, and minimize fall risk.  Equipment: {equipment:3041657}  Reasons for discharge: {Reason for discharge:3049018}  Patient/family agrees with progress made and goals achieved: {Pt/Family agree with progress/goals:3049020}  PT Discharge Precautions/Restrictions   Vital Signs  Pain   Pain Interference   Vision/Perception     Cognition   Sensation   Motor     Mobility   Locomotion     Trunk/Postural Assessment     Balance   Extremity Assessment            Lorie Phenix 06/19/2022, 8:00 AM

## 2022-06-19 NOTE — Progress Notes (Signed)
Inpatient Rehabilitation Care Coordinator Discharge Note   Patient Details  Name: ALPHONSINE MINIUM MRN: [id number] Date of Birth: Sep 23, 1932   Discharge location: D/c to home with hospice  Length of Stay: 21 days  Discharge activity level: Mod A to Total A  Home/community participation: Limited  Patient response QK:MMNOTR Literacy - How often do you need to have someone help you when you read instructions, pamphlets, or other written material from your doctor or pharmacy?: Never  Patient response RN:HAFBXU Isolation - How often do you feel lonely or isolated from those around you?: Patient unable to respond  Services provided included: MD, RD, OT, Pharmacy, Neuropsych, SW, TR, CM, RN, SLP, PT  Financial Services:  Financial Services Utilized: [place] [employer]  Choices offered to/list presented to: N/A  Follow-up services arranged:  Patient/Family request agency HH/DME     HH/DME Requested Agency: Home with Hospice per family. DME provided by hospice- hoyer lift, hospital bed  Patient response to transportation need: Is the patient able to respond to transportation needs?: Yes In the past 12 months, has lack of transportation kept you from medical appointments or from getting medications?: No In the past 12 months, has lack of transportation kept you from meetings, work, or from getting things needed for daily living?: No   Comments (or additional information):  Patient/Family verbalized understanding of follow-up arrangements:  Yes  Individual responsible for coordination of the follow-up plan: contact pt dtr [name]  Confirmed correct DME delivered: Rana Snare 06/19/2022    Rana Snare

## 2022-06-19 NOTE — Progress Notes (Signed)
Occupational Therapy Discharge Summary  Patient Details  Name: Katelyn Powers MRN: [id number] Date of Birth: 11/15/1931  Date of Discharge from [place] 22, 2023   Patient has met 4 of 10 long term goals due to improved activity tolerance, improved balance, and improved coordination.  Patient to discharge at overall Max Assist - total assistance level.  Patient's care partner is independent and has hospice services available  to provide the necessary physical and cognitive assistance at discharge.    Reasons goals not met: Pt demo's varying levels of assistance and performance due to fluctuations with cognition throughout admission (secondary to [place]). [name] cognition deficits impact ability for command following or attention to task.   Recommendation:  Patient will benefit from ongoing skilled OT services in home health setting and/or hospice services to continue to advance functional skills in the area of BADL and Reduce care partner burden.  Equipment: Pt to go home with hospice. Hospice to provide any equipment after DC.  Reasons for discharge: discharge from hospital  Patient/family agrees with progress made and goals achieved: Yes  OT Discharge Precautions/Restrictions  Precautions Precautions: Fall;Other (comment) Precaution Comments: L inattention, hx of Alzheimer's Restrictions Weight Bearing Restrictions: No General Chart Reviewed: Yes Vital Signs Please see "Flowsheet" for most recent vitals charted by nursing staff.   ADL (ADL info from most previous OT session) ADL Eating: Maximal assistance Where Assessed-Eating: Bed level Grooming: Minimal assistance Where Assessed-Grooming: Wheelchair, Sitting at sink Upper Body Bathing: Maximal assistance (Pt requires maximal assistance to bathe UB parts while in shower seated on [facility] for shower chair. Pt able to wash face with assistance for thoroughness.) Where Assessed-Upper Body Bathing: Shower Lower Body Bathing:  Maximal assistance (Pt requires maximal assistance to bathe LB parts while seated n BSC to improve safety in the shower.) Where Assessed-Lower Body Bathing: Shower Upper Body Dressing:  (Due to time constraint, pt requires maximal assistance to doff a pull-over style shirt and a button-up style jacket.) Where Assessed-Upper Body Dressing: Edge of bed Lower Body Dressing:  (Pt requires maximal assistance to doff pants, brief, and socks/shoes due to time constraint while showering.) Where Assessed-Lower Body Dressing: Edge of bed Toileting: Dependent (Pt voids bladder while seated on BSC in the shower, however, pt unaware of voiding of bladder and requires total assistance to perform hygiene.) Where Assessed-Toileting: Bedside Commode (in shower) Toilet Transfer: Moderate assistance Toilet Transfer Method: Counselling psychologist: Grab bars, Extra wide Geophysical data processor: Moderate assistance (Pt able to ambulate from w/c > BSC placed in the shower with moderate assistance x 2 with no AD. Pt demo's increased difficulty with motor planning using the FWW. Pt then requires maximal assistance to perform SPT to the L from [facility] in the shower > w/c.) Social research officer, government Method: Ambulating Walk-In Shower Equipment: Grab bars (BSC in shower) ADL Comments: Pt demo's improved alertness today. However, pt requires increased assistance to complete bathing and dressing tasks due to increased fatigue and difficulty with command following. Pt demo's intermittent (low) agitation with shower due to increased water pressure which OT is unable to change. Pt able to be redirected with comfort of washing hair, however, pt unable to participate in bathing due to fatigue and low frustration tolerance for water pressure. Vision Baseline Vision/History: 0 No visual deficits (difficult to determine - pt intermittently uses reading glasses) Patient Visual Report: Peripheral vision  impairment Vision Assessment?:  (difficult to assess due to cognitive deficits) Eye Alignment: Impaired (comment) Ocular Range of  Motion: Restricted on the left Alignment/Gaze Preference: Gaze right Tracking/Visual Pursuits: Decreased smoothness of horizontal tracking;Decreased smoothness of vertical tracking Saccades:  (difficult to assess due to cognitive deficits) Additional Comments: Difficult to fully assess visual perceptual skills due to cognitive deficits. Perception  Perception: Impaired Inattention/Neglect: Does not attend to left visual field Praxis Praxis: Impaired Praxis Impairment Details: Initiation;Motor planning Cognition Cognition Overall Cognitive Status: History of cognitive impairments - at baseline Arousal/Alertness: Awake/alert Orientation Level: Person Memory: Impaired Memory Impairment: Storage deficit Attention: Focused;Sustained;Selective Focused Attention: Impaired Sustained Attention: Impaired Selective Attention: Impaired Awareness: Impaired Problem Solving: Impaired Safety/Judgment: Impaired Brief Interview for Mental Status (BIMS) Repetition of Three Words (First Attempt): None Temporal Orientation: Year: Nonsensical Temporal Orientation: Month: Nonsensical Temporal Orientation: Day: Nonsensical Recall: "Sock": No, could not recall Recall: "Blue": No, could not recall Recall: "Bed": No, could not recall BIMS Summary Score: 0 Sensation Sensation Light Touch:  (responds to touch, but unable to perform formal testing due to cognitive deficits) Hot/Cold: Not tested Proprioception: Not tested Stereognosis: Not tested Coordination Gross Motor Movements are Fluid and Coordinated: No Fine Motor Movements are Fluid and Coordinated: No Coordination and Movement Description: impaired due to generalized weakness and deconditioning with delayed initiation of movements and impaired balance Finger Nose Finger Test: unable to attend to test; able to  oppose digits and able to grab wash cloth wiht LUE Motor  Motor Motor: Hemiplegia;Abnormal postural alignment and control Motor - Skilled Clinical Observations: very mild L hemiparesis, generalized weakness and deconditioning with delayed initiation of movements and impaired balance Motor - Discharge Observations: ? cognitive/attention deficits impacting movement v true motor planing/praxis issues Mobility  Bed Mobility Supine to Sit: Moderate Assistance - Patient 50-74%;Maximal Assistance - Patient - Patient 25-49% (fluctuates depending on cognition) Sit to Supine: Moderate Assistance - Patient 50-74% Transfers Sit to Stand: Moderate Assistance - Patient 50-74% Stand to Sit: Moderate Assistance - Patient 50-74%  Trunk/Postural Assessment  Cervical Assessment Cervical Assessment: Exceptions to [facility] (forward head) Thoracic Assessment Thoracic Assessment: Exceptions to [facility] (thoracic rounding of shouldfes) Lumbar Assessment Lumbar Assessment: Exceptions to [facility] (posterior pelvic tilt) Postural Control Postural Control: Deficits on evaluation Trunk Control: delayed Postural Limitations: decreased  Balance Balance Balance Assessed: Yes (grossly assessed with ADLs - requires moderate - maximal assist for standing balance) Static Sitting Balance Static Sitting - Balance Support: Feet supported Static Sitting - Level of Assistance: 7: Independent Dynamic Sitting Balance Dynamic Sitting - Balance Support: Feet supported Dynamic Sitting - Level of Assistance: 5: Stand by assistance Dynamic Sitting - Balance Activities: Reaching for objects Static Standing Balance Static Standing - Balance Support: Bilateral upper extremity supported Static Standing - Level of Assistance: 3: Mod assist Dynamic Standing Balance Dynamic Standing - Balance Support: Bilateral upper extremity supported Dynamic Standing - Level of Assistance: 3: Mod assist Extremity/Trunk Assessment RUE Assessment RUE  Assessment: Within Functional Limits LUE Assessment LUE Assessment: Exceptions to [facility] Strength Comments: mild weakness compaired to R LUE Body System: Neuro Brunstrum levels for arm and hand: Arm;Hand Brunstrum level for arm: Stage V Relative Independence from Synergy Brunstrum level for hand: Stage V Independence from basic synergies   Barbee Shropshire 06/19/2022, 4:59 PM

## 2022-06-20 ENCOUNTER — Other Ambulatory Visit: Payer: Self-pay | Admitting: Interventional Cardiology

## 2022-06-20 ENCOUNTER — Encounter: Payer: Self-pay | Admitting: *Deleted

## 2022-06-20 NOTE — Telephone Encounter (Signed)
Opened in error

## 2022-06-20 NOTE — Plan of Care (Signed)
  Problem: RH Balance Goal: LTG Patient will maintain dynamic sitting balance (PT) Description: LTG:  Patient will maintain dynamic sitting balance with assistance during mobility activities (PT) Outcome: Completed/Met Goal: LTG Patient will maintain dynamic standing balance (PT) Description: LTG:  Patient will maintain dynamic standing balance with assistance during mobility activities (PT) Outcome: Completed/Met   Problem: Sit to Stand Goal: LTG:  Patient will perform sit to stand with assistance level (PT) Description: LTG:  Patient will perform sit to stand with assistance level (PT) Outcome: Completed/Met   Problem: RH Bed Mobility Goal: LTG Patient will perform bed mobility with assist (PT) Description: LTG: Patient will perform bed mobility with assistance, with/without cues (PT). Outcome: Completed/Met   Problem: RH Bed to Chair Transfers Goal: LTG Patient will perform bed/chair transfers w/assist (PT) Description: LTG: Patient will perform bed to chair transfers with assistance (PT). Outcome: Completed/Met   Problem: RH Car Transfers Goal: LTG Patient will perform car transfers with assist (PT) Description: LTG: Patient will perform car transfers with assistance (PT). Outcome: Completed/Met   Problem: RH Ambulation Goal: LTG Patient will ambulate in controlled environment (PT) Description: LTG: Patient will ambulate in a controlled environment, # of feet with assistance (PT). Outcome: Completed/Met Goal: LTG Patient will ambulate in home environment (PT) Description: LTG: Patient will ambulate in home environment, # of feet with assistance (PT). Outcome: Completed/Met

## 2022-06-20 NOTE — Telephone Encounter (Deleted)
Transitional Care call--who you talked with    Are you/is patient experiencing any problems since coming home? Are there any questions regarding any aspect of care? Are there any questions regarding medications administration/dosing? Are meds being taken as prescribed? Patient should review meds with caller to confirm Have there been any falls? Has Home Health been to the house and/or have they contacted you? If not, have you tried to contact them? Can we help you contact them? Are bowels and bladder emptying properly? Are there any unexpected incontinence issues? If applicable, is patient following bowel/bladder programs? Any fevers, problems with breathing, unexpected pain? Are there any skin problems or new areas of breakdown? Has the patient/family member arranged specialty MD follow up (ie cardiology/neurology/renal/surgical/etc)?  Can we help arrange? Does the patient need any other services or support that we can help arrange? Are caregivers following through as expected in assisting the patient? Has the patient quit smoking, drinking alcohol, or using drugs as recommended?  Appointment time, arrive time and who it is with here [address] [phone]

## 2022-07-02 ENCOUNTER — Ambulatory Visit: Payer: Self-pay | Admitting: Licensed Clinical Social Worker

## 2022-07-02 NOTE — Patient Instructions (Signed)
Visit Information  Instructions:   Patient was given the following information about care management and care coordination services today, agreed to services, and gave verbal consent: 1.care management/care coordination services include personalized support from designated clinical staff supervised by their physician, including individualized plan of care and coordination with other care providers 2. 24/7 contact phone numbers for assistance for urgent and routine care needs. 3. The patient may stop care management/care coordination services at any time by phone call to the office staff.  Patient verbalizes understanding of instructions and care plan provided today and agrees to view in MyChart. Active MyChart status and patient understanding of how to access instructions and care plan via MyChart confirmed with patient.     No further follow up required: .  Khamani Daniely, BSW , MSW Social Worker [facility] Care Management  [phone]      

## 2022-07-02 NOTE — Patient Outreach (Signed)
  Care Coordination   Initial Visit Note   07/02/2022 Name: RAJVI ARMENTOR MRN: [id number] DOB: 06-17-1932  ADASHA BOEHME is a 88 y.o. year old female who sees Cyndi Bender, [place] for primary care. I spoke with  [name] by phone today.  What matters to the patients health and wellness today?  Patient had no current needs.     Goals Addressed               This Visit's Progress     MSW Care Coordination (pt-stated)        SW spoke with caregiver and daughter Lynelle Smoke. Patient has no needs at the moment and currently in hospice. SW completed SDOH and reviewed chart. Patient requested a follow-up on recent ER experience. Patient stated she would contact the charge nurse for a follow up. SW gave patient contact information for future reference if needed.         SDOH assessments and interventions completed:  Yes     Care Coordination Interventions Activated:  Yes  Care Coordination Interventions:  Yes, provided   Follow up plan: No further intervention required.   Encounter Outcome:  Pt. Visit Completed   Lenor Derrick, MSW  Social Worker [facility] Care Management  [phone]

## 2022-09-27 DIAGNOSIS — H1033 Unspecified acute conjunctivitis, bilateral: Secondary | ICD-10-CM | POA: Diagnosis not present

## 2022-10-30 ENCOUNTER — Ambulatory Visit (HOSPITAL_COMMUNITY)
Admission: RE | Admit: 2022-10-30 | Payer: Medicare Other | Source: Ambulatory Visit | Attending: Interventional Cardiology | Admitting: Interventional Cardiology
